# Patient Record
Sex: Male | Born: 1945 | Race: White | Hispanic: No | Marital: Married | Smoking: Current every day smoker
Health system: Southern US, Community
[De-identification: ages and names within clinical notes are randomized; demographics above are authoritative.]

## PROBLEM LIST (undated history)

## (undated) DIAGNOSIS — J189 Pneumonia, unspecified organism: Secondary | ICD-10-CM

## (undated) DIAGNOSIS — E119 Type 2 diabetes mellitus without complications: Secondary | ICD-10-CM

## (undated) DIAGNOSIS — I509 Heart failure, unspecified: Secondary | ICD-10-CM

## (undated) DIAGNOSIS — C679 Malignant neoplasm of bladder, unspecified: Secondary | ICD-10-CM

## (undated) DIAGNOSIS — I2510 Atherosclerotic heart disease of native coronary artery without angina pectoris: Secondary | ICD-10-CM

## (undated) DIAGNOSIS — Z8673 Personal history of transient ischemic attack (TIA), and cerebral infarction without residual deficits: Secondary | ICD-10-CM

## (undated) DIAGNOSIS — I10 Essential (primary) hypertension: Secondary | ICD-10-CM

## (undated) DIAGNOSIS — Z8679 Personal history of other diseases of the circulatory system: Secondary | ICD-10-CM

## (undated) DIAGNOSIS — E782 Mixed hyperlipidemia: Secondary | ICD-10-CM

## (undated) HISTORY — DX: Pneumonia, unspecified organism: J18.9

## (undated) HISTORY — PX: BICEPS TENDON REPAIR: SHX566

## (undated) HISTORY — PX: CARDIAC ELECTROPHYSIOLOGY MAPPING AND ABLATION: SHX1292

## (undated) HISTORY — PX: CARPAL TUNNEL RELEASE: SHX101

## (undated) HISTORY — PX: ROTATOR CUFF REPAIR: SHX139

## (undated) HISTORY — PX: OTHER SURGICAL HISTORY: SHX169

## (undated) HISTORY — PX: TIBIA FRACTURE SURGERY: SHX806

## (undated) HISTORY — PX: CORONARY ANGIOPLASTY: SHX604

## (undated) HISTORY — PX: BALLOON ANGIOPLASTY, ARTERY: SHX564

## (undated) HISTORY — PX: CORONARY STENT PLACEMENT: SHX1402

---

## 1983-11-24 DIAGNOSIS — K449 Diaphragmatic hernia without obstruction or gangrene: Secondary | ICD-10-CM | POA: Insufficient documentation

## 1998-06-20 ENCOUNTER — Encounter: Payer: Self-pay | Admitting: Emergency Medicine

## 1998-06-20 ENCOUNTER — Emergency Department (HOSPITAL_COMMUNITY): Admission: EM | Admit: 1998-06-20 | Discharge: 1998-06-20 | Payer: Self-pay | Admitting: Emergency Medicine

## 1999-04-22 ENCOUNTER — Ambulatory Visit (HOSPITAL_BASED_OUTPATIENT_CLINIC_OR_DEPARTMENT_OTHER): Admission: RE | Admit: 1999-04-22 | Discharge: 1999-04-23 | Payer: Self-pay | Admitting: Orthopedic Surgery

## 1999-07-03 ENCOUNTER — Encounter: Payer: Self-pay | Admitting: Orthopedic Surgery

## 1999-07-03 ENCOUNTER — Encounter: Admission: RE | Admit: 1999-07-03 | Discharge: 1999-07-03 | Payer: Self-pay | Admitting: Orthopedic Surgery

## 1999-07-04 ENCOUNTER — Encounter: Admission: RE | Admit: 1999-07-04 | Discharge: 1999-07-04 | Payer: Self-pay | Admitting: Orthopedic Surgery

## 1999-07-16 ENCOUNTER — Encounter: Payer: Self-pay | Admitting: Orthopedic Surgery

## 1999-07-16 ENCOUNTER — Encounter: Admission: RE | Admit: 1999-07-16 | Discharge: 1999-07-16 | Payer: Self-pay | Admitting: Orthopedic Surgery

## 1999-12-21 ENCOUNTER — Encounter: Payer: Self-pay | Admitting: Neurosurgery

## 1999-12-21 ENCOUNTER — Encounter: Admission: RE | Admit: 1999-12-21 | Discharge: 1999-12-21 | Payer: Self-pay | Admitting: Neurosurgery

## 1999-12-24 ENCOUNTER — Encounter: Payer: Self-pay | Admitting: Neurosurgery

## 1999-12-24 ENCOUNTER — Ambulatory Visit (HOSPITAL_COMMUNITY): Admission: RE | Admit: 1999-12-24 | Discharge: 1999-12-24 | Payer: Self-pay | Admitting: Neurosurgery

## 2000-08-03 ENCOUNTER — Ambulatory Visit (HOSPITAL_COMMUNITY): Admission: RE | Admit: 2000-08-03 | Discharge: 2000-08-03 | Payer: Self-pay | Admitting: Neurosurgery

## 2000-08-09 ENCOUNTER — Encounter: Payer: Self-pay | Admitting: Neurosurgery

## 2000-08-09 ENCOUNTER — Ambulatory Visit (HOSPITAL_COMMUNITY): Admission: RE | Admit: 2000-08-09 | Discharge: 2000-08-09 | Payer: Self-pay | Admitting: Neurosurgery

## 2000-10-06 ENCOUNTER — Encounter: Admission: RE | Admit: 2000-10-06 | Discharge: 2000-10-06 | Payer: Self-pay | Admitting: Neurosurgery

## 2000-10-06 ENCOUNTER — Encounter: Payer: Self-pay | Admitting: Neurosurgery

## 2000-10-09 ENCOUNTER — Emergency Department (HOSPITAL_COMMUNITY): Admission: EM | Admit: 2000-10-09 | Discharge: 2000-10-09 | Payer: Self-pay | Admitting: Emergency Medicine

## 2000-10-09 ENCOUNTER — Encounter: Payer: Self-pay | Admitting: Emergency Medicine

## 2000-12-16 ENCOUNTER — Encounter: Payer: Self-pay | Admitting: Neurosurgery

## 2000-12-16 ENCOUNTER — Ambulatory Visit (HOSPITAL_COMMUNITY): Admission: RE | Admit: 2000-12-16 | Discharge: 2000-12-16 | Payer: Self-pay | Admitting: Neurosurgery

## 2002-02-05 ENCOUNTER — Inpatient Hospital Stay (HOSPITAL_COMMUNITY): Admission: EM | Admit: 2002-02-05 | Discharge: 2002-02-07 | Payer: Self-pay | Admitting: Emergency Medicine

## 2003-03-04 ENCOUNTER — Emergency Department (HOSPITAL_COMMUNITY): Admission: EM | Admit: 2003-03-04 | Discharge: 2003-03-04 | Payer: Self-pay | Admitting: Emergency Medicine

## 2003-03-21 ENCOUNTER — Ambulatory Visit (HOSPITAL_COMMUNITY): Admission: RE | Admit: 2003-03-21 | Discharge: 2003-03-21 | Payer: Self-pay | Admitting: Unknown Physician Specialty

## 2004-12-28 ENCOUNTER — Ambulatory Visit: Payer: Self-pay | Admitting: Family Medicine

## 2005-01-07 ENCOUNTER — Ambulatory Visit: Payer: Self-pay | Admitting: Family Medicine

## 2005-01-11 ENCOUNTER — Ambulatory Visit: Payer: Self-pay | Admitting: Family Medicine

## 2005-01-18 ENCOUNTER — Ambulatory Visit: Payer: Self-pay | Admitting: Family Medicine

## 2005-02-04 ENCOUNTER — Ambulatory Visit: Payer: Self-pay | Admitting: Family Medicine

## 2005-03-22 ENCOUNTER — Ambulatory Visit: Payer: Self-pay | Admitting: Family Medicine

## 2005-04-01 ENCOUNTER — Encounter: Admission: RE | Admit: 2005-04-01 | Discharge: 2005-04-01 | Payer: Self-pay | Admitting: General Surgery

## 2005-05-05 ENCOUNTER — Inpatient Hospital Stay (HOSPITAL_COMMUNITY): Admission: EM | Admit: 2005-05-05 | Discharge: 2005-05-07 | Payer: Self-pay | Admitting: Emergency Medicine

## 2005-06-21 ENCOUNTER — Ambulatory Visit: Payer: Self-pay | Admitting: Family Medicine

## 2005-06-25 ENCOUNTER — Ambulatory Visit: Payer: Self-pay | Admitting: Family Medicine

## 2005-07-23 ENCOUNTER — Ambulatory Visit: Payer: Self-pay | Admitting: Family Medicine

## 2005-07-30 ENCOUNTER — Ambulatory Visit: Payer: Self-pay | Admitting: Family Medicine

## 2005-10-12 ENCOUNTER — Ambulatory Visit: Payer: Self-pay | Admitting: Family Medicine

## 2005-11-25 ENCOUNTER — Ambulatory Visit: Payer: Self-pay | Admitting: Family Medicine

## 2005-11-25 ENCOUNTER — Inpatient Hospital Stay (HOSPITAL_COMMUNITY): Admission: EM | Admit: 2005-11-25 | Discharge: 2005-11-26 | Payer: Self-pay | Admitting: Emergency Medicine

## 2005-11-28 ENCOUNTER — Ambulatory Visit: Payer: Self-pay | Admitting: Internal Medicine

## 2005-11-28 ENCOUNTER — Inpatient Hospital Stay (HOSPITAL_COMMUNITY): Admission: EM | Admit: 2005-11-28 | Discharge: 2005-12-01 | Payer: Self-pay | Admitting: Emergency Medicine

## 2005-12-14 ENCOUNTER — Ambulatory Visit: Payer: Self-pay | Admitting: Internal Medicine

## 2005-12-20 ENCOUNTER — Ambulatory Visit: Payer: Self-pay | Admitting: Internal Medicine

## 2005-12-20 ENCOUNTER — Ambulatory Visit (HOSPITAL_COMMUNITY): Admission: RE | Admit: 2005-12-20 | Discharge: 2005-12-21 | Payer: Self-pay | Admitting: Internal Medicine

## 2006-01-18 ENCOUNTER — Ambulatory Visit: Payer: Self-pay | Admitting: Internal Medicine

## 2006-01-19 ENCOUNTER — Ambulatory Visit: Payer: Self-pay | Admitting: Family Medicine

## 2006-05-24 ENCOUNTER — Ambulatory Visit: Payer: Self-pay | Admitting: Family Medicine

## 2006-06-20 ENCOUNTER — Ambulatory Visit: Payer: Self-pay | Admitting: Family Medicine

## 2006-06-30 ENCOUNTER — Ambulatory Visit: Payer: Self-pay | Admitting: Family Medicine

## 2006-08-02 ENCOUNTER — Ambulatory Visit: Payer: Self-pay | Admitting: Family Medicine

## 2006-08-16 ENCOUNTER — Ambulatory Visit: Payer: Self-pay | Admitting: Family Medicine

## 2006-09-02 ENCOUNTER — Inpatient Hospital Stay (HOSPITAL_COMMUNITY): Admission: EM | Admit: 2006-09-02 | Discharge: 2006-09-05 | Payer: Self-pay | Admitting: Emergency Medicine

## 2007-01-04 ENCOUNTER — Inpatient Hospital Stay (HOSPITAL_COMMUNITY): Admission: EM | Admit: 2007-01-04 | Discharge: 2007-01-05 | Payer: Self-pay | Admitting: *Deleted

## 2007-10-09 ENCOUNTER — Encounter: Admission: RE | Admit: 2007-10-09 | Discharge: 2007-11-07 | Payer: Self-pay | Admitting: Family Medicine

## 2007-11-23 ENCOUNTER — Encounter: Admission: RE | Admit: 2007-11-23 | Discharge: 2007-11-23 | Payer: Self-pay | Admitting: Sports Medicine

## 2009-07-01 ENCOUNTER — Inpatient Hospital Stay (HOSPITAL_COMMUNITY): Admission: EM | Admit: 2009-07-01 | Discharge: 2009-07-07 | Payer: Self-pay | Admitting: Emergency Medicine

## 2010-03-15 ENCOUNTER — Encounter: Payer: Self-pay | Admitting: Family Medicine

## 2010-05-11 LAB — GLUCOSE, CAPILLARY
Glucose-Capillary: 129 mg/dL — ABNORMAL HIGH (ref 70–99)
Glucose-Capillary: 147 mg/dL — ABNORMAL HIGH (ref 70–99)

## 2010-05-12 LAB — GLUCOSE, CAPILLARY
Glucose-Capillary: 108 mg/dL — ABNORMAL HIGH (ref 70–99)
Glucose-Capillary: 142 mg/dL — ABNORMAL HIGH (ref 70–99)
Glucose-Capillary: 155 mg/dL — ABNORMAL HIGH (ref 70–99)
Glucose-Capillary: 163 mg/dL — ABNORMAL HIGH (ref 70–99)
Glucose-Capillary: 189 mg/dL — ABNORMAL HIGH (ref 70–99)
Glucose-Capillary: 241 mg/dL — ABNORMAL HIGH (ref 70–99)
Glucose-Capillary: 88 mg/dL (ref 70–99)

## 2010-05-12 LAB — APTT: aPTT: 26 seconds (ref 24–37)

## 2010-05-12 LAB — BASIC METABOLIC PANEL
Chloride: 104 mEq/L (ref 96–112)
Creatinine, Ser: 0.96 mg/dL (ref 0.4–1.5)
GFR calc Af Amer: >60 mL/min (ref >60)
GFR calc non Af Amer: >60 mL/min (ref >60)
Potassium: 4.5 mEq/L (ref 3.5–5.1)

## 2010-05-12 LAB — CBC
HCT: 39.3 % (ref 39.0–52.0)
MCV: 88 fL (ref 78.0–100.0)
RBC: 4.46 MIL/uL (ref 4.22–5.81)
WBC: 10.2 10*3/uL (ref 4.0–10.5)

## 2010-05-12 LAB — ABO/RH: ABO/RH(D): O POS

## 2010-05-12 LAB — PROTIME-INR: Prothrombin Time: 13.3 seconds (ref 11.6–15.2)

## 2010-07-07 NOTE — Discharge Summary (Signed)
NAMEKAMIL, Richard [name]                ACCOUNT NO.:  [id number]   MEDICAL RECORD NO.:  [id number]          PATIENT TYPE:  INP   LOCATION:  2870                         FACILITY:  [facility]   PHYSICIAN:  Nanetta Batty, M.D.   DATE OF BIRTH:  [birth year]   DATE OF ADMISSION:  09/02/2006  DATE OF DISCHARGE:  09/05/2006                               DISCHARGE SUMMARY   DISCHARGE DIAGNOSIS:  1. Chest pain status post cardiac catheterization during this      admission on September 05, 2006.  No critical coronary disease, with      patent previously placed stent to the RCA.  2. Known coronary artery disease, status post right coronary artery      stenting in March 2007.  3. Atrial flutter status post AV node dilation; maintaining sinus      rhythm.  4. Hypertension.  5. Hyperlipidemia.  6. Type 2 diabetes mellitus.   HOSPITAL COURSE:  This is a 65 year old Caucasian gentleman, patient of  Dr. Allyson Sabal; with previous history of CAD and RCA stenting in March 2007.  He presented to the emergency room with complaints of chest pain.  He  had the onset of pain on September 02, 2006 after lunch.  He described this  as a midsternal pressure, radiating to the back.  The patient complained  of progressive shortness of breath and exertional dyspnea over the last  month or so; but, never had chest pain actually before the day of  presentation to the ER.  He described feeling poorly all day long, with  a decreased activity tolerance.  We admitted him to the hospital on a  rule-out MI protocol.  We cycled his enzymes, which were negative x3.   His physical examination did not reveal any significant findings.  He  was kept in the telemetry unit over the night on IV heparin, and  scheduled for catheterization on Monday, September 05, 2006.   HOSPITAL PROCEDURE:  Cardiac catheterization was performed by Dr. Tresa Endo.  The catheterization revealed left ventricular ejection fraction of 45%  with some mild anteroapical akinesis.  He  also had some scattered  noncritical coronary artery disease in all three arteries; and the  previously placed stents into the RCA were patent.  The patient  tolerated the procedure well.  After that he was transferred to the  short-stay unit for observation until his bedrest expires.  If he  remains asymptomatic and stable after ambulation post bedrest, he might  be discharged home.   DISCHARGE MEDICATIONS:  1. Actos 45 mg daily.  2. Metformin 500 mg daily.  3. Zetia 10 mg daily.  4. Lopressor 50 mg b.i.d.  5. Plavix 71 mg daily.  6. Aspirin 81 mg daily.  7. Protonix 40 mg daily.  8. Imdur 30 mg daily.   Again, the patient was instructed to restart Glucophage on Thursday,  September 08, 2006.   DISCHARGE DIET:  Low-salt, low-fat, low-cholesterol diet.   DISCHARGE FOLLOWUP:  Dr. Nanetta Batty will see the patient in our  office on 2:30 p.m. September 19, 2006.   DISCHARGE INSTRUCTIONS:  The patient was instructed not to drive and not  to lift weights for 3 days post catheterization.  He was advised to call  our office should he develop any problems.      Raymon Mutton, P.A.      Nanetta Batty, M.D.  Electronically Signed    MK/MEDQ  D:  09/05/2006  T:  09/06/2006  Job:  [id number]

## 2010-07-07 NOTE — Cardiovascular Report (Signed)
NAME:  Richard, [name] NO.:  [id number]   MEDICAL RECORD NO.:  [id number]          PATIENT TYPE:  INP   LOCATION:  2870                         FACILITY:  [facility]   PHYSICIAN:  Nicki Guadalajara, M.D.     DATE OF BIRTH:  08-12-1945   DATE OF PROCEDURE:  09/05/2006  DATE OF DISCHARGE:  09/05/2006                            CARDIAC CATHETERIZATION   INDICATIONS:  Richard [name] is a 65 year old gentleman, patient of  Dr. Allyson Sabal, who has a history of known CAD.  He is status post stenting  of his proximal and mid right coronary artery in March 2007 with  placement of 3.5 x 13 and 3.5 x 18 mm Cypher stents.  His last  catheterization in October 2007 showed stents patent, but he did have  20% stenosis between the stented segment.  He had 30% narrowing in the  circumflex bifurcation of the OM-1 vessel.  Ejection fraction was 50%.  The patient had been doing fairly well.  Approximately 2 weeks ago, he  self discontinued Plavix.  He has continued to smoke cigarettes.  He  ultimately presented to [facility] late Friday evening with  chest pain worrisome for unstable angina.  He ruled out for myocardial  infarction.  Enzymes were negative.  The patient also has a history of  the AV node ablation for atrial flutter, hypertension, hyperlipidemia  and diabetes mellitus.  Definitive repeat catheterization was  recommended.   PROCEDURE:  After premedication with Valium 5 mg intravenously, the  patient was prepped and draped in the usual fashion.  His right femoral  artery was punctured anteriorly, and a 5-French sheath was inserted  without difficulty.  Diagnostic catheterization was done utilizing 5-  [place] Judkins 4 left and right coronary catheters.  A 5-French pigtail  catheter was used for biplane cine left ventriculography.  Hemostasis  was obtained by direct manual pressure.  The patient tolerated the  procedure well.   HEMODYNAMIC DATA:  Central aortic pressure was  160/79.  Left ventricular  pressure was 160/14, post A-wave 21.   ANGIOGRAPHIC DATA:  Left main coronary was angiographically normal and  bifurcated into an LAD and left circumflex system.   There was mild 20% ostial narrowing of the diagonal vessel which arose  from the mid LAD.  The remainder of the LAD was free of significant  disease.   The circumflex vessel had 20% ostial area of irregularity.  There was 20-  30% narrowing in the circumflex at the bifurcation of the OM vessel.   The right coronary artery was a dominant vessel.  There were widely  patent stents in the proximal to mid segment.  There was 20% narrowing  just proximal to the second stent in the region that was not stented  between the two previously placed stents.  The remainder of the RCA was  free of significant disease.   Biplane selective left ventriculography revealed mild LV dysfunction  with an ejection fraction of 45%.  There was mild hypocontractility  inferiorly on the RAO projection and involving the inferoapical to low  posterolateral wall  on the LAO projection.   IMPRESSION:  1. Mild left ventricular dysfunction with inferior to inferolateral      hypocontractility and ejection fraction 45%.  2. Mild coronary obstructive disease with 20% narrowing in the ostium      of the diagonal vessel of the left anterior descending; 20% ostial      circumflex stenosis with 20-30% bifurcation stenosis in the mid to      distal circumflex involving a bifurcation of a marginal vessel; and      widely patent stents in the proximal to mid right coronary artery      with 20% narrowing between the stented segments just proximal to      the mid right coronary artery stent.   RECOMMENDATIONS:  Medical therapy.  The patient will be continued on  Plavix indefinitely.  He also was advised smoking cessation.  Medications will be adjusted for more optimal blood pressure control.           ______________________________   Nicki Guadalajara, M.D.     TK/MEDQ  D:  09/05/2006  T:  09/05/2006  Job:  [id number]   cc:   Nanetta Batty, M.D.

## 2010-07-07 NOTE — Discharge Summary (Signed)
NAME:  Richard [name], Richard [name] NO.:  [id number]   MEDICAL RECORD NO.:  [id number]          PATIENT TYPE:  INP   LOCATION:  4735                         FACILITY:  [facility]   PHYSICIAN:  Nanetta Batty, M.D.   DATE OF BIRTH:  09-25-1945   DATE OF ADMISSION:  01/04/2007  DATE OF DISCHARGE:  01/05/2007                               DISCHARGE SUMMARY   Richard [name] is a 65 year old male patient with previous history of  coronary disease and AV nodal ablation.  He was visiting his mother in  the neuro ICU unit at [facility] and he had an episode of severe chest  pain radiating to his lower jaw.  He went to the emergency room.  He was  given 3 nitroglycerin and eventually started on IV nitroglycerin drip  with eventual relief of his pain.  He was seen by Dr. Elsie Lincoln who thought  he should probably undergo another cardiac catheterization.  He was put  on IV heparin.  His CK-MBs and troponins all came back negative.  His  labs looked appropriate.  He was stable for a cardiac cath on January 05, 2007 and his left main, LAD, and left circumflex were okay.  His RCA  which was dominant with the stents was widely patent.  His EF was 45-  50%.  The cath was performed by Dr. Nanetta Batty, his primary  cardiologist.  He had his hours of bed rest.  He was up walking in the  halls and it was decided he was stable for discharge home.   DISCHARGE MEDICATIONS:  1. Metoprolol ER 25 mg 1-1/2 tabs twice daily.  2. Aspirin 81 mg daily.  3. Actos 45 mg daily.  4. Plavix 75 mg daily.  5. Metformin nightly.  6. Vytorin 1 tab daily.  7. Glipizide ER 5 mg daily.  8. Prilosec 20 mg twice a day for 2 weeks and then once a day.  9. Hold metformin until Sunday.  10.Nitroglycerin 1/150 one under tongue every 5 minutes x3 when needed      for chest pain.   LABS:  Hemoglobin 13, hematocrit 38.2, WBC 8.1 and platelets 168.  His  sodium was 139, potassium 3.9, BUN was 11, creatinine 0.91, hemoglobin  A1c  was 9.4.  TSH was 1.676, total cholesterol is 113, triglycerides 73,  HDL was 40, LDL was 58.   DISCHARGE DIAGNOSES:  1. Chest pain, not cardiac ischemia, status post cardiac      catheterization and patent stents, no obstructive disease.  2. Ischemic cardiomyopathy with an ejection fraction of 45-50%.  3. Non-insulin-dependent diabetes mellitus with poor glycemic control      with a hemoglobin A1c of 9.4.  4. Prior history of coronary artery disease with history of 2 drug-      eluting stents placed to the RCA March 2007, July 2008 with      noncritical disease, patent stent.  5. Status post atrioventricular node ablation by Dr.  Klein, October      20 07, for A-flutter and PSVT.  6. Gastroesophageal reflux disease placed on Prilosec, was  not on a      PPI prior, chest pain may be related to GERD.      Lezlie Octave, N.P.      Nanetta Batty, M.D.  Electronically Signed    BB/MEDQ  D:  01/05/2007  T:  01/06/2007  Job:  [id number]   cc:   Stefanie Libel

## 2010-07-07 NOTE — Cardiovascular Report (Signed)
NAME:  Richard [name], Richard [name] NO.:  [id number]   MEDICAL RECORD NO.:  [id number]          PATIENT TYPE:  INP   LOCATION:  4735                         FACILITY:  [facility]   PHYSICIAN:  Nanetta Batty, M.D.   DATE OF BIRTH:  19-Dec-1945   DATE OF PROCEDURE:  DATE OF DISCHARGE:  01/05/2007                            CARDIAC CATHETERIZATION   [name] is a 65 year old gentleman with a history of known CAD status  post PCI stenting of his proximal mid RCA using Cypher drug-eluting  stents March 2007. His other problems include history of type 2  diabetes, controlled dyslipidemia and AV node ablation as well as GERD.  He was admitted yesterday with unstable angina after visiting his mother  in the ICU.  He ruled out for myocardial infarction by isoenzymes.  He  had no EKG changes.  He was placed on IV heparin and nitroglycerin.  He  presents now for a diagnostic coronary angiography to define his anatomy  and rule out ischemic etiology.   DESCRIPTION OF PROCEDURE:  The patient was brought to the second floor  [facility] cardiac cath lab in the postabsorptive state.  He was  premedicated with p.o. Valium, IV Versed and fentanyl.  His right groin  was prepped and shaved in the usual sterile fashion. 1% Xylocaine  was  used for local anesthesia.  A 6-French sheath was inserted into the  right femoral artery using the standard Seldinger technique.  A 6-French  right and left Judkins diagnostic catheter as well as a 6-French pigtail  catheter were used for selective cholangiography and left  ventriculography respectively.  Visipaque dye was used for the entirety  of the case.  Retrograde aorta, ventricular and pullback pressures were  recorded.   HEMODYNAMIC RESULTS:  Aortic systolic pressure 119, diastolic pressure  66.  Left ventricular systolic pressure 129, end-diastolic pressure 15.  There is no obvious pullback gradient noted visually.   SELECTIVE CORONARY ANGIOGRAPHY:  1. Left main normal.  2. LAD normal.  3. Left circumflex normal.  4. Right coronary artery is dominant with two patent stents in the      proximal mid portion and no other significant disease.   LEFT VENTRICULOGRAPHY;  RAO left ventriculogram was performed using 25  mL of Visipaque dye at 12 mL per second.  The overall LVEF was estimated  between 45 and 50% with mild to moderate inferobasal hypokinesia.   IMPRESSION:  [name] has widely patent right coronary artery stents  with noncritical disease and preserved left ventricular function.  His  anatomy is unchanged since his prior cath done 5 months ago.  His  heparin and nitroglycerin were discontinued.  ACT was measured and the  sheath was sewn securely in place.  The patient left the  lab in stable condition. The sheath will be removed once the ACT falls  below 175.  He will remain recumbent for 5 hours at which time he will  be discharged home and will see him back in the office in 1-2 weeks for  follow-up.  He left the lab in stable  condition.      Nanetta Batty, M.D.  Electronically Signed     JB/MEDQ  D:  01/05/2007  T:  01/05/2007  Job:  [id number]   cc:   Delaney Meigs, M.D.

## 2010-07-10 NOTE — Op Note (Signed)
NAME:  Richard [name], Richard [name]                ACCOUNT NO.:  [id number]   MEDICAL RECORD NO.:  [id number]          PATIENT TYPE:  OIB   LOCATION:  3706                         FACILITY:  [facility]   PHYSICIAN:  Duke Salvia, MD, FACCDATE OF BIRTH:  1946-01-15   DATE OF PROCEDURE:  12/22/2005  DATE OF DISCHARGE:  12/21/2005                                 OPERATIVE REPORT   PREOPERATIVE DIAGNOSES:  Atrial flutter, atrial tachycardia.   POSTOPERATIVE DIAGNOSES:  Atrial flutter, atrial tachycardia.   PROCEDURES:  Invasive electrophysiological study, arrhythmia mapping times  two, radiofrequency catheter ablation times two, and isoproterenol infusion.   SURGEON:  Duke Salvia, MD, [facility].   DESCRIPTION OF PROCEDURE:  Following the obtaining of informed consent, the  patient was brought to the electrophysiology laboratory and placed on the  fluoroscopic table in a supine position. After routine prep and drape,  cardiac catheterization was performed under local anesthesia and conscious  sedation.  Noninvasive blood pressure monitoring, transcutaneous oxygen  saturation monitoring and end-tidal CO2 monitoring were performed  continuously throughout the procedure.  Following the procedure, the  catheters were removed, hemostasis was obtained, and the patient was  transferred to the holding area in a stable condition.   CATHETERS:  A 5-French quadripolar catheter was inserted via the left  femoral vein to the AV junction to measure the His electrogram.   A 5-French quadripolar catheter was inserted via the left femoral vein to  the right ventricular apex. .   A 6-French octapolar catheter was inserted via the right femoral vein to the  coronary sinus.   A 7-French duodecapolar catheter was inserted via the left femoral vein to  the tricuspid annulus and the high right atrium.   An 8-French x 8-mm deflectable-tip ablation catheter was inserted into the  right femoral vein to mapping sites in the  posterior septal space, and then  subsequently switched out for a 5-mm ablation catheter for mapping in the  high right atrium.   Surface leads 1, aVF and v1 were monitored continuously throughout the  procedure.  Following insertion of the catheters, the stimulation protocol  included incremental atrial pacing, incremental ventricular pacing, single  and double-atrial extrastimuli paced cycle lengths of 500 and 400 msec in  the absence and in the presence of isoproterenol.   RESULTS:  Surface electrocardiogram:  Initial: Rhythm: sinus; RR interval 965 msec; PR interval 176 msec; QRS  duration 111 msec; QT interval 465 msec; P wave duration: 126 msec; bundle  branch block absent; preexcitation absent; AH interval 72 msec; HP interval  47 msec.   Final: Rhythm: sinus: RR interval 712 msec; PR interval 164 msec; QRS  duration 94 msec; QT interval 394 msec; P wave duration 108 msec; bundle  branch block absent; preexcitation absent; AH interval 62 msec; HP interval  41 msec.   AV Wenckebach 400 msec; VA Wenckebach was 600 msec.   AV nodal conduction was continuous.   Accessory pathway function:  No evidence of an accessory pathway was  identified.   Arrhythmias induced:  Atrial flutter was transiently induced, but mostly  gave rise to atrial tachycardia.  Because of typical atrial flutter by  electrocardiogram, electrogram mapping was used to undertake RF catheter  ablation of the cavotricuspid isthmus, which was accomplished successfully.   Thereafter, atrial tachycardia was inducible in a nonsustained form.  Mapping demonstrated early atrial activation in the high right atrium along  the crista.  The cycle length of the tachycardia was about 330 msec.  Mapping in that area to preclude diaphragmatic stimulation and potential  injury resulted in termination of the tachycardia repeatedly; not even with  isoproterenol would it sustain.   RF ablation was undertaken with sites of  earliest atrial activation where we  were -15 to -20 prior to the surface P wave after termination of  tachycardia, but the tachycardia remained inducible.   Fluoroscopy time:  A total of 8 minutes 32 seconds fluoroscopy time was  utilized at 7 1/2 frames per second in the biplane and single-plane modes.   Radiofrequency energy:  A total of 9 minutes 59 seconds of RF energy was  applied to sites in the cavotricuspid isthmus, resulting in a cavotricuspid  isthmus conduction block, and in sites in the high right atrium, as  described previously.   IMPRESSION:  1. Normal sinus function.  2. Abnormal atrial function manifested by (a) cavotricuspid isthmus      conduction and previously documented atrial flutter, and (b) a high      right atrial tachycardia along the crista terminalis, the former of      which was successfully eliminated, and the latter of which was not for      the reasons described above.  3. Normal atrioventricular nodal function.  4. Normal His-Purkinje system function.  5. No accessory pathway.  6. Normal ventricular response to programmed stimulation.   SUMMARY:  The results of the electrophysiology testing and arrhythmia  mapping demonstrated cavotricuspid isthmus conduction that was successfully  eliminated with RF catheter ablation, as well as a high right atrial  tachycardia that was in the vicinity of the crista terminalis and  potentially the phrenic nerve.  Because of the difficulties as outlined  above, it was elected to terminate further ablation and consider restudy  with ESI.   COMPLICATIONS:  The patient had a significant amount of discomfort with the  procedure, and we actually had to reverse his sedation to help facilitate  cooperation.  I should note that this report is dictated after the patient  was discharged, and the patient has no recollection of his discomfort. Because of that, in the event that we return for further repeat procedure, I   would recommend that we do this under anesthesia.           ______________________________  Duke Salvia, MD, [facility]     SCK/MEDQ  D:  12/22/2005  T:  12/22/2005  Job:  [id number]   cc:   Nanetta Batty, M.D.  Electrophysiology Laboratory

## 2010-07-10 NOTE — Discharge Summary (Signed)
NAME:  [name], [name] NO.:  [id number]   MEDICAL RECORD NO.:  [id number]          PATIENT TYPE:  INP   LOCATION:  3734                         FACILITY:  [facility]   PHYSICIAN:  Richard [name], M.D.   DATE OF BIRTH:  1945/09/18   DATE OF ADMISSION:  11/28/2005  DATE OF DISCHARGE:  12/01/2005                                 DISCHARGE SUMMARY   [name] is a 65 year old white, married, male patient who was  recently admitted on November 25, 2005 with complaints of chest pain.  He  underwent cardiac cath on November 26, 2005.  He had patent RCA stents and  then an EF of 50%, 30% in his distal circumflex.  He comes back to the  hospital on November 28, 2005 with symptoms of severe shortness of breath,  accompanied by tightness and pressure in the chest and jaw.  He came to the  ER, his heart rate was 170 in a PSVT and he was given adenosine.  It broke;  however, he then went back into SVT and he again received adenosine, which  broke it again.  He then had a third episode of SVT and our service was  called to intervene.  He was seen by Dr. Elsie Lincoln and it was decided to admit  him and begin antiarrhythmic.  He started Rythmol 225 mg p.o. b.i.d.  His  Toprol was held.  He also was started on Xanax because of anxiety.  It was  then decided that he should undergo an EP evaluation for possible ablation  of his PSVT.  He was seen by Dr. 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  It was decided to stop his Rythmol and to put him on  metoprolol and verapamil and a recommendation for ablation.  The patient  agreed with the ablation.  Because of scheduling, it was decided that he  should be discharged home and then return in about 2 weeks for his ablation.  He will be seen back in the office by Dr. Graciela Husbands on December 14, 2005 and his  ablation is scheduled for December 20, 2005.  After  receiving several doses  of metoprolol and verapamil, he had no further arrhythmia, last one being  November 30, 2005 at 1 a.m.  Patient was, again, recommended to quit smoking.  He was put on Xanax for anxiety.  He has tried Chantix in the past, but it  gave him severe nightmares.  He has tried multiple statins and they all give  him severe myalgias to the point of crippling him.  Thus, at the time of  discharge, it was decided to try him on plain Zetia.   LABS:  Hemoglobin was 14.6, hematocrit 42.0, platelets 166.  BNP was 55.  CK-  MBs were negative.  His sodium was 139, potassium 4.1, chloride 110, BUN 12,  creatinine was 1.0.  His TSH was 5.013.  Chest x-ray showed no acute  findings.   DISCHARGE MEDICATIONS:  Are:  1. Protonix  40 mg two times per day.  2. Actos 45 mg one time per day.  3. Aspirin 81 mg one time per day.  4. Plavix 75 mg one time per day.  5. Xanax 0.5 mg two times a day when needed.  6. Metoprolol 50 mg two times per day.  7. Verapamil 40 mg every 8 hours.  8. Zetia 10 mg one time per day.   He will follow up with Dr. Allyson Sabal in the first 2 weeks of November.  He will  go back to see Dr. Graciela Husbands on October 23, and he needs to have his ablation on  October 29.   DISCHARGE DIAGNOSES:  1. Paroxysmal supra-ventricular tachycardia and possible atrial      tachycardia.  2. Coronary artery disease, status post right coronary artery stenting x2      with CYPHER stents May 06, 2005, recathed November 26, 2005, showing      patent stents.  3. Noninsulin dependent diabetes mellitus.  4. Hyperlipidemia.  5. Gastroesophageal reflux disease.  6. Ejection fraction of 50%.  7. Anxiety.      Richard [name], N.P.      Richard [name], M.D.  Electronically Signed    BB/MEDQ  D:  12/01/2005  T:  12/01/2005  Job:  [id number]   cc:   Duke Salvia, MD, [facility]  Delaney Meigs, M.D.

## 2010-07-10 NOTE — Cardiovascular Report (Signed)
Richard [name], Richard [name]                ACCOUNT NO.:  [id number]   MEDICAL RECORD NO.:  [id number]          PATIENT TYPE:  INP   LOCATION:  2016                         FACILITY:  [facility]   PHYSICIAN:  Nanetta Batty, M.D.   DATE OF BIRTH:  [birth year]   DATE OF PROCEDURE:  05/06/2005  DATE OF DISCHARGE:                              CARDIAC CATHETERIZATION   PROCEDURE:  Cardiac catheterization/percutaneous coronary intervention and  stent.   Richard [name] is a 65 year old married white male, father of four, whose mother  Lendell Caprice is a patient of mine.  He had coronary angiography performed by Dr.  Geralynn Rile February 06, 2002 revealing noncritical CAD.  He does have  positive risk factors including tobacco abuse, insulin-requiring diabetes.  He was admitted March 14 with unstable angina.  He ruled out for myocardial  infarction.  He presents now for diagnostic coronary arteriography to rule  out ischemic etiology.   PROCEDURE DESCRIPTION:  The patient was brought to the second floor [facility] in a postabsorptive state.  He was  premedicated with p.o. Valium and IV morphine.  His right groin was prepped  and shaved in the usual sterile fashion.  1% Xylocaine was used for local  anesthesia.  A 6 French sheath was inserted into the right femoral artery  using standard Seldinger technique.  A 6 French right Judkins diagnostic  catheter along with 6 French pigtail catheter were used for selective  coronary angiography, left ventriculography, sub-selective left internal  mammary artery angiography and distal abdominal aortography.  Visipaque dye  was used for the entirety of the case.  Aortic, ventricular and pulmonic  blood pressures were recorded.   HEMODYNAMICS:  1.  Aortic systolic pressure 108, diastolic pressure 62.  2.  Left ventricular systolic pressure 110, diastolic pressure 18.   SELECTIVE CORONARY ANGIOGRAPHY:  1.  Left main normal.  2.  LAD normal.  3.  Left circumflex 40% mid after first OM.  4.  Ramus intermedius branch small and normal.  5.  Right coronary artery large dominant vessel with 60-70% tubular      segmental proximal followed by 90% segmental mid on bend straddling the      moderate-sized acute marginal branch.   Left ventriculography; RAO left ventriculogram was performed using 25 mL of  Visipaque dye at 12 mL/sec.  LV EF was estimated at greater than 60% without  focal wall motion abnormalities.   Distal abdominal aortography performed using 20 mL of Visipaque dye at 20  mL/sec.  The renal arteries were widely patent.  The infrarenal abdominal  aorta and iliac bifurcation appear free of significant atherosclerotic  changes.   IMPRESSION:  Richard [name] has high grade proximal and mid right coronary  artery disease which are his culprit lesions.  Will plan to proceed with PCI  and stenting using Angiomax and drug-eluting stents.   PROCEDURE DESCRIPTION:  The existing 6 French sheath in the right femoral  artery was exchanged over a wire for 7 [place] sheath.  A 6 French sheath was  then inserted into the right femoral  vein.  The patient had received aspirin  this morning and received 600 mg of p.o. Plavix loading as well as Angiomax  load with an ACT documented at 336.  Visipaque dye was used for the entirety  of the case.  Retrograde aortic pressure was monitored during the case.  The  patient received intercoronary nitroglycerin twice during the case.   Using a 7 [place] JR4 guide catheter along with an [id number] Asahi soft and a  2.5 12 Maverick, PCI was performed on the mid RCA lesion.  Stenting was  performed using a 3.5 18 CYPHER stent at 15 atmospheres on the mid lesion  and 3.5 13 CYPHER direct stenting on the proximal lesion after 10  atmospheres resulting in reduction of both lesions to 0% residual with TIMI  III flow.  The patient did develop some chest pain requiring IV and  sublingual nitroglycerin most  likely related to transient occlusion of the  acute marginal branch which returned after nitroglycerin administration.   IMPRESSION:  Successful percutaneous coronary intervention and stenting of  the proximal and mid dominant right coronary artery with drug-eluting stent  and Angiomax with an excellent angiography result.   The guide wire and catheter were removed.  The sheaths were sewn securely in  place.  The patient left the lab in stable condition.  He will be hydrated  overnight and treated with aspirin and Plavix.  He will be discharged home  in the morning with close office followup.  He left the lab in stable  condition.      Nanetta Batty, M.D.  Electronically Signed     JB/MEDQ  D:  05/06/2005  T:  05/07/2005  Job:  [id number]   cc:   [facility]   Delaney Meigs, M.D.  Fax: [phone]

## 2010-07-10 NOTE — Discharge Summary (Signed)
NAME:  Richard [name], Richard [name]                ACCOUNT NO.:  [id number]   MEDICAL RECORD NO.:  [id number]          PATIENT TYPE:  OIB   LOCATION:  3706                         FACILITY:  [facility]   PHYSICIAN:  Duke Salvia, MD, FACCDATE OF BIRTH:  1945/05/30   DATE OF ADMISSION:  12/20/2005  DATE OF DISCHARGE:  12/21/2005                                 DISCHARGE SUMMARY   This patient has NO KNOWN DRUG ALLERGIES.   PRINCIPAL DIAGNOSES:  1. Discharging day 1, status post electrophysiology study/radiofrequency      catheter ablation of atrial flutter with creation of KVO tricuspid      isthmus block.  2. Study, October 29, also found a high right atrial cristal originating      tachycardia.  3. History of palpitations/chest tightness with atrial arrhythmias.  4. Patient returned in 6 weeks for [facility] guided electrophysiology      study/radiofrequency catheter ablation of right atrial cristal tachy      arrhythmia.   SECONDARY DIAGNOSES:  1. Coronary artery disease, admitted October of 2007 with chest pain.      a.     Identify spacers A with: Catheterization November 26, 2005.       Stents placed March of 2007 in the proximal to mid RCA are patent.       Ejection fraction is 50%.  2. Type 2 diabetes.  3. Dyslipidemia.  4. Gastroesophageal reflux disease.  5. Anxiety.   PROCEDURE:  December 20, 2005, electrophysiology study/radiofrequency  catheter ablation of atrial flutter - KVO tricuspid isthmus block, Dr.  Sherryl Manges.  The patient had no post procedural complications and has  maintained sinus rhythm.   BRIEF HISTORY:  Richard [name] is a 65 year old male with admissions x2 in early  October of 2007.  On October 4, he was admitted with chest pain.  A left  heart catheterization was done October 5.  It showed an ejection fraction of  50%.  Stents, which had been placed in March of 2007 in the proximal and mid  right coronary artery are both patent.   He was readmitted October 7, with  dyspnea, palpitations, chest tightness  which radiated to the jaw.  In the emergency room, he was found to have an  atrial dysrhythmia.  His heart rate was 170.  It broke with adenosine  challenge, suggesting atrial flutter.  There was a second on inspection of  his telemetry strips by Dr. 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  The  patient achieved rhythm control with both metoprolol and verapamil.  He was  discharged October 10 and was set up for an office visit with Dr. Graciela Husbands on  October 23.  At that time, the patient discussed with Dr. Graciela Husbands, the  possibility of an electrophysiology study with radiofrequency catheter  ablation.  The risks and benefits of the procedure were described to the  patient and he presents October 29 for that procedure.   Of note, the patient reports on 1 episode of very short lived palpitation  and chest tightness since his discharge on October 10.   HOSPITAL  COURSE:  The patient presents electively October 29.  He underwent  electrophysiology study with successful radiofrequency catheter ablation of  a typical counter clockwise atrial flutter with creation of a KVO tricuspid  isthmus block.  There was a second atrial arrhythmia, which was discovered  high in the right atrial cristal area, but his terminated prior to  radiofrequency application with phrenic pacing.  The patient was discharged  October 30 and revisit [facility] 6 weeks from now for a [facility] guided  electrophysiology study and possible ablation of this high right atrial  cristal tachycardia.   The patient is discharged on the following medications:  1. Metoprolol 75 mg twice daily, this is a new dose.  2. Protonix 40 mg twice daily.  3. Actos 45 mg daily.  4. Enteric-coated aspirin 81 mg daily.  5. Plavix 75 mg daily.  6. Xanax 0.5 mg every 12 hours as needed.  7. Verapamil 40 mg every 8 hours.   FOLLOWUP:  1. At [facility], November 26, [facility], to see Dr. Graciela Husbands,       Tuesday, November 27 at 11:30 in the morning.  2. To return [facility], Tuesday, December 18 at      6 o'clock in the morning.  He is asked not to eat anything after      midnight, Monday, December 17 and he is to hold off on taking his      verapamil and metoprolol the morning of December 18.   LABORATORY STUDIES:  Pertinent to this admission were obtained October 23.  Complete blood count:  White cells are 7.6, hemoglobin 14.7, hematocrit  44.3, platelets 207.  A PT was 12.3, INR 1.0.  Sodium is 141, potassium 4.1,  chloride 105, carbonate 31, glucose 123, BUN is 10, creatinine 0.9.     ______________________________  Maple Mirza, PA    ______________________________  Duke Salvia, MD, [facility]    GM/MEDQ  D:  12/21/2005  T:  12/21/2005  Job:  [id number]   cc:   Madaline Savage, M.D.  Delaney Meigs, M.D.

## 2010-07-10 NOTE — Op Note (Signed)
[place]  Patient:    Richard [name], Richard [name]                       MRN: [id number] Proc. Date: 04/22/99 Adm. Date:  [id number] Attending:  Cornell Barman                           Operative Report  PREOPERATIVE DIAGNOSIS:  Ruptured biceps, proximal left shoulder.  POSTOPERATIVE DIAGNOSIS:  Ruptured biceps, proximal left shoulder.  OPERATION:  Open repair, ruptured biceps and reattachment of proximal biceps tendon to transverse ligament and bicipital groove.  SURGEON:  Lenard Galloway. Chaney Malling, M.D.  ANESTHESIA:  General.  DESCRIPTION OF PROCEDURE:  After satisfactory induction of general endotracheal  anesthesia, the patient was placed on the operating table in the semi-sitting position.  An incision was made from the sternum down to the pectoral groove and carried distally.  Skin edges were retracted.  Bleeders were coagulated.  The biceps was found balled up distally in the distal third of the upper arm.  The ruptured biceps tendon could be found, and this was brought up proximally quite  easily.  Sutures were placed through the stump.  The area of the biceps and groove was identified nicely.  Sutures were passed underneath the transverse ligament ver the bicipital groove, and this was attached to the biceps tendon.  This was brought through the bicipital groove and folded back onto itself on top of the transverse ligament.  A number of heavy Tycron interrupted mattress sutures were used to stabilize the proximal end of the biceps.  The biceps was put under normal tension, with the elbow flexed.  Excellent repair of the proximal end of the biceps was achieved.  Throughout the procedure the shoulder was irrigated copiously with antibiotic solution.  Closure for subcutaneous tissue was achieved with 2-0 Vicryl.  Stainless steel and Steri-Strips were used to close the skin.  Sterile dressings were applied.  A posterior plaster splint  where he was able to flex was applied. The patient was then returned to recovery room in excellent condition.  DISPOSITION: 1. To [facility] for pain control and    nausea control. 2. Discharge in the a.m. 3. Return to my office in one week. 4. Sling left arm with posterior plaster splint, to keep the elbow flexed. DD:  04/22/99 TD:  04/22/99 Job: [id number]

## 2010-07-10 NOTE — Assessment & Plan Note (Signed)
[facility]                           ELECTROPHYSIOLOGY OFFICE NOTE   NAME:Richard [name], Richard [name]                       MRN:          [id number]  DATE:12/14/2005                            DOB:          [birth year]    Mr. Schleyer is seen in anticipation of an ablation procedure next week.  He  was seen a couple of weeks ago in the hospital where he had two different  arrhythmias noted, one was short RP tachycardia and the other was atrial  flutter.  Because of diabetes and hypertension, it was elected to proceed  with flutter ablation to minimize thromboembolic longterm risks.  His  Rythmol therapy that had been started was discontinued because of his prior  coronary artery disease with stenting.  On his combination of Verapamil and  Toprol, he has had no subsequent episodes.   We have reviewed again the procedure.   PHYSICAL EXAMINATION:  VITAL SIGNS:  Blood pressure 130/84, pulse 61.  LUNGS:  Clear.  HEART:  Heart sounds are regular.  EXTREMITIES:  Without edema.   He will be coming in on Monday for his procedure.  I have reviewed as noted,  benefits and potential risks.  He understands, agrees, and is willing to  proceed.            ______________________________  Duke Salvia, MD, [facility]     SCK/MedQ  DD:  12/14/2005  DT:  12/14/2005  Job #:  [id number]   cc:   Madaline Savage, M.D.  [facility]

## 2010-07-10 NOTE — Discharge Summary (Signed)
Richard [name], Richard [name]                ACCOUNT NO.:  [id number]   MEDICAL RECORD NO.:  [id number]          PATIENT TYPE:  INP   LOCATION:  3737                         FACILITY:  [facility]   PHYSICIAN:  Darlin Priestly, MD  DATE OF BIRTH:  01-Oct-1945   DATE OF ADMISSION:  11/25/2005  DATE OF DISCHARGE:  11/26/2005                                 DISCHARGE SUMMARY   DISCHARGE DIAGNOSES:  1. Chest pain, etiology undetermined, likely musculoskeletal.  2. Known history of coronary artery disease status post catheterization      during this admission by Dr. Jacinto Halim, with patent stents in the right      coronary artery from March of 2007.  3. Hyperlipidemia.  4. Noninsulin-dependent diabetes mellitus type 2, newly diagnosed during      last admission.   HISTORY OF PRESENT ILLNESS/HOSPITAL COURSE:  This is a 65 year old gentleman  with previous history of coronary artery disease who presented to the ER  with complaints of dull pain in the left upper shoulder and chest pain.  The  chest pain was dull and started at 8 o'clock in the morning.  Patient  presented to the hospital in the afternoon.  He denied any shortness of  breath or palpitations, but complained of diaphoresis.  There was also no  nausea or vomiting.  He was admitted to the telemetry unit.  His enzymes  were cycled and revealed no ischemia.  Patient was admitted to the telemetry  unit and scheduled for cardiac catheterization the next day.   HOSPITAL PROCEDURES:  Cardiac cath performed by Dr. Jacinto Halim on October 27, 2005.  It revealed ejection fraction 50%, patent stent in the RCA.  He also  has mild disease in the distal circumflex, around 30%.  Since his  catheterization did not reveal any intrinsic stenosis or any progression of  the circumflex disease, the etiology of chest pain was determined to be non  cardiac and empirically patient's Protonix was increased to 40 mg b.i.d.  dose, and he was started on Relafen for 7-10  days.   HOSPITAL LABORATORIES:  Hemoglobin A1c was 7.5 which is an improvement since  his prior hospitalization when it was 9.3. CBC showed white blood cell count  8.4, hemoglobin 15, hematocrit 38, platelets 164.  INR 1.  BNP 137,  potassium of 3.8, chloride 104, CO2 27, glucose 172, BUN 21, creatinine 1,  calcium 8.9.  TSH 4.288.  Lipid profile:  Total cholesterol 111,  triglycerides 215, cholesterol HDL 36, and LDL 52.   DISCHARGE MEDICATIONS:  1. Protonix 40 mg b.i.d.  2. Aspirin 81 mg daily.  3. Plavix 75 mg daily.  4. Lopressor 25 mg b.i.d.  5. Actos 45 mg daily.  6. Flexeril 10 mg 2x to 3x a day p.r.n.  7. Relafen 500 mg daily.   DISCHARGE DIET:  Low-fat, low cholesterol, low salt diet.   DISCHARGE ACTIVITIES:  No driving.  No lifting greater than 5 pounds for 3  days post cath.   DISCHARGE FOLLOWUP:  Dr. Allyson Sabal will see patient in our office on October  17th  at 12 noon.      Raymon Mutton, P.A.      Darlin Priestly, MD  Electronically Signed    MK/MEDQ  D:  11/26/2005  T:  11/27/2005  Job:  [id number]   cc:   [facility]  Dr. Allyson Sabal  Dr. Lysbeth Galas

## 2010-07-10 NOTE — H&P (Signed)
NAME:  Richard [name], Richard [name]                          ACCOUNT NO.:  [id number]   MEDICAL RECORD NO.:  [id number]                   PATIENT TYPE:  EMS   LOCATION:  MAJO                                 FACILITY:  [facility]   PHYSICIAN:  Rollene Rotunda, M.D. [facility]            DATE OF BIRTH:  04-Sep-1945   DATE OF ADMISSION:  02/05/2002  DATE OF DISCHARGE:                                HISTORY & PHYSICAL   REASON FOR PRESENTATION:  Evaluate patient with chest pain.   HISTORY OF PRESENT ILLNESS:  The patient is a very pleasant 65 year-old  white gentleman with a history of chest discomfort in 1988.  He reports a  catheterization without any apparent obstructive coronary artery disease.  He has had no further cardiac evaluation since then.  He is relatively  inactive because of back and neck pain suffered on the job.  However, with  his activities of daily living he has not been able to induce chest  discomfort.  He reports, however, one week of chest discomfort at rest.  This has been substernal.  He has noted it mostly in the evening. It is a 6  out of 10.  It is a chest pressure.  He has discomfort traveling down his  left arm.  It is happening with increasing frequency and intensity.  He did  wake at 4 a.m. with this discomfort and had some this morning.  He has had  no associated symptoms such as nausea, vomiting or diaphoresis. This is  unlike any  pain that he has had in the past.  He saw Dr. Dewaine Conger this  morning because he was complaining of polyuria.  He says Dr. Dewaine Conger did a  test and told him he had diabetes.  When questioned about the chest  discomfort, it was felt prudent to send him to the hospital.   PAST MEDICAL HISTORY:  1. New onset diabetes.  2. No history of hypertension.  3. Unknown cholesterol status.  4. Tobacco abuse.  5. Cervical and lumbar neck pain.   PAST SURGICAL HISTORY:  1. Cervical disk surgery.   ALLERGIES:  None.   MEDICATIONS:  1. Darvocet.  2. Codeine.  3. OxyContin p.r.n. (rarely according to the patient).   SOCIAL HISTORY:  He lives in [place] with his wife who is an Charity fundraiser at [employer].  They have grown children and grandchildren. 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  He has a 40  pack year smoking history.   FAMILY HISTORY:  Contributory for his brother having bypass at the age of  65.   REVIEW OF SYMPTOMS:  As stated in the history of present illness and  positive for nocturia, polydipsia and polyphagia, otherwise negative for  other systems.   PHYSICAL EXAMINATION:  GENERAL:  The patient is in no distress.  VITAL SIGNS:  Blood  pressure is 140/91, heart rate 73 and regular, afebrile.  HEENT:  Eyes are unremarkable.  Pupils equal, round and reactive to light.  Fundi are not visualized.  Oral mucosa is unremarkable. He is edentulous.  Neck: No jugular venous distention.  Wave forms within normal limits.  Carotid upstrokes are brisk and symmetric and no bruits or thyromegaly.  LYMPHATICS:  No cervical, axillary or inguinal adenopathy.  LUNGS:  Clear to auscultation bilaterally.  BACK:  No costovertebral angle tenderness.  CHEST:  Unremarkable.  HEART:  PMI is neither displaced or sustained.  S1 and S2 are within normal  limits.  No S3.  No S4.  No murmurs.  ABDOMEN:  Flat, positive bowel sounds, normal in frequency and pitch.  No  bruits, rebound or guarding.  Midline pulse not auscultated.  No  hepatomegaly.  No splenomegaly.  SKIN:  No rashes.  No nodules.  EXTREMITIES:  2+ pulses throughout.  No edema.  NEUROLOGIC:  The patient is oriented to person, place and time.  Cranial  nerves II-XII are grossly intact.  Motor is grossly intact.   EKG:  Sinus rhythm, rate 68, axes within normal limits, intervals within  normal limits and no acute ST or T wave changes.   Chest x-ray:  No acute disease.   LABORATORY DATA:  Hemoglobin 15.0, WBC 9.0, platelets 192,000.  Sodium 138,   potassium 4.1, BUN 14, creatinine 1.1.  CK 78, MB 1.8, Troponin 0.02.   ASSESSMENT:  1. Chest discomfort.  The patient's chest discomfort is consistent with     angina. It is an unstable pattern increasing in frequency and occurring     at rest.  Given this, it is prudent to admit him to the hospital and     treat him with Lovenox, aspirin and beta blocker.  He will have elective     cardiac catheterization.  We will treat him with secondary risk     reduction.  2. Diabetes.  We will check a hemoglobin A1c and initiate an oral agent.  We     will discuss this with Dr. Dewaine Conger to get his agent of choice.  3. Risk reduction. We will check a lipid profile.  4. Tobacco.  He will have a stop smoking consult.                                               Rollene Rotunda, M.D. [facility]    JH/MEDQ  D:  02/05/2002  T:  02/05/2002  Job:  [id number]   cc:   Colon Flattery  [address]  [place]  [place] [address]  Fax: [phone]

## 2010-07-10 NOTE — Discharge Summary (Signed)
NAMEPAXON, PROPES                ACCOUNT NO.:  [id number]   MEDICAL RECORD NO.:  [id number]          PATIENT TYPE:  INP   LOCATION:  6532                         FACILITY:  [facility]   PHYSICIAN:  Raymon Mutton, P.A. DATE OF BIRTH:  04/03/1945   DATE OF ADMISSION:  05/05/2005  DATE OF DISCHARGE:  05/07/2005                                 DISCHARGE SUMMARY   DISCHARGE DIAGNOSES:  1.  Coronary artery disease, status post percutaneous coronary intervention      during this admission.  2.  Non-insulin-dependent diabetes mellitus.  3.  Hypertension.  4.  Status post cervical __________.   HOSPITAL PROCEDURES:  Cardiac catheterization performed by Dr. Allyson Sabal on  May 06, 2005, that revealed mid RCA high-grade lesion of 90% and also the  proximal RCA with a 60 to 70% stenosis.  Dr. Allyson Sabal placed two CYPHER stents  in the RCA with reduction of the lesions from 90 to 0% and 60 to 70% to 0%.   HISTORY OF PRESENT ILLNESS/HOSPITAL COURSE:  This is a 65 year old  gentleman, who went to his primary care physician complaining of chest pain  radiating to the jaw and the left axilla, pressure in the chest, and  shortness of breath.  He was sent to the emergency room for evaluation.  He  had a prior catheterization with same symptoms, but no PCI was done at the  same time.  The patient had been doing relatively good since then up until  recently when he started having the discomfort, and today the pain was  severe enough forcing the patient to go and seek help.   On May 06, 2005, he underwent cardiac catheterization with placement of  two stents in the proximal and mid RCA.  For full disclosure of this  procedure, please refer to the dictated note.  The patient also has a  history of smoking, and a discussion was held with the patient about smoking  cessation.   HOSPITAL LABORATORY DATA:  Hemoglobin 13.6, hematocrit 39.5, white blood  cell count 8.1, platelets 156.  Sodium 139, potassium 4.3,  chloride 104, CO2  29, BUN 11, creatinine 0.9, glucose 192.  Cardiac enzymes were negative x3.  Total cholesterol 186, triglycerides 138, HDL 36, LDL 122, TSH 2.148,  glycosylated hemoglobin 9.3.   DISCHARGE INSTRUCTIONS:  The patient is to stay on a low salt, low fat, low  cholesterol and low carb diet. No driving for three days post cath.  No  lifting greater than 5 pounds post cath.  Increase activity slowly.   DISCHARGE MEDICATIONS:  1.  Lopressor 25 mg b.i.d.  2.  Aspirin 325 mg daily.  3.  Plavix 75 mg daily.  4.  Zocor 20 mg daily.   DISCHARGE FOLLOWUP:  Dr. Allyson Sabal will see patient on May 21, 2005, at 3  o'clock.  Also advised patient to address his poorly controlled diabetes  mellitus with his primary physician.      Raymon Mutton, P.A.     MK/MEDQ  D:  05/07/2005  T:  05/08/2005  Job:  [id number]

## 2010-07-10 NOTE — Cardiovascular Report (Signed)
NAMESAMMUEL, BLICK                ACCOUNT NO.:  [id number]   MEDICAL RECORD NO.:  [id number]          PATIENT TYPE:  INP   LOCATION:  3737                         FACILITY:  [facility]   PHYSICIAN:  Cristy Hilts. Jacinto Halim, MD       DATE OF BIRTH:  [birth year]   DATE OF PROCEDURE:  11/26/2005  DATE OF DISCHARGE:                              CARDIAC CATHETERIZATION   ATTENDING CARDIOLOGIST:  Nanetta Batty, M.D.   PRIMARY CARE PHYSICIAN:  Delaney Meigs, M.D.   OPERATION/PROCEDURE:  1. Left ventriculography.  2. Selective left coronary arteriography.  3. Intracoronary nitroglycerin administration.  4. Right femoral angiography and closure of right femoral artery access      with Starclose.   INDICATIONS:  Mr. Aniketh Huberty is a 65 year old gentleman with history of  known coronary artery disease with history of angioplasty to his right  coronary artery on May 06, 2005.  He has history of tobacco use, diabetes  and was admitted with unstable angina during March 2007.  He is now being  admitted to the hospital with atypical chest pain, but because of the  patient's concern and to evaluate for provisional coronary disease, because  of continued risk factors, he was brought to the cardiac catheterization lab  for definitive delineation of his coronary anatomy.   HEMODYNAMIC DATA:  Left ventricular pressure of 129/12 with an end diastolic  pressure was 30 mmHg.  The aortic pressure was 129/32 with a mean of 95  mmHg.  There was no pressure gradient across the aortic valve.   ANGIOGRAPHIC DATA:  Left ventricle:  Left ventricular systolic function was  in the lower limits of normal.  Ejection fraction of 50%.  There is mild  inferior and inferoapical hypokinesis.  There is no significant mitral  regurgitation.   Right coronary artery:  The right coronary artery is a large caliber vessel  and dominant.  The previously placed  3.5 x 13 in the proximal and 3.5 x 18  mm in the mid Cypher stents  placed on May 06, 2005 are widely patent.  Between the two stents there is at most 20% stenosis.  Proximal to the  proximal stent, there is a eccentric calcification, but there is no luminal  obstruction.   Left main:  The left main is a large caliber vessel, smooth and normal.   Circumflex:  The circumflex is a large caliber vessel gives origin OM-1 and  continues as an OM-2.  At the bifurcation of OM-1, there is a 30% stenosis  which is unchanged from prior cardiac catheterization.   Ramus intermediate:  The ramus intermediate is a very small vessel with  smooth and normal.   LAD:  The LAD is a large caliber vessel, gives origin to small diagonals.  Diagonal-3 is the largest of the vessels.  The LAD is smooth and is  otherwise normal.   IMPRESSION:  1. Low normal left ventricular systolic function with ejection fraction of      50%.  2. Elevated left ventricular end diastolic pressure probably secondary to      diastolic  dysfunction from diabetes and hypertension and coronary      artery disease.  3. No significant progression of coronary artery disease.   RECOMMENDATIONS:  The patient can be discharged home today.  The chest pain  is probably of noncardiac etiology and may indicate skeletal muscular pain.  He will follow up with Dr. Nanetta Batty.   TECHNIQUE:  Under usual sterile precautions using a 6-French right femoral  artery access, a 6-French multipurpose B2 catheter was advanced in the  ascending aorta over a 0.035 J- wire. The catheter was gently advanced in  the left ventricle, and the left ventricular pressure monitored. Hand  contrast injection of the left ventricular was performed both in the LAO and  RAO projections.  Catheter was flushed with saline, pulled back in the  ascending aorta PG recorded.  Right coronary artery was selectively engaged,  and angiography was performed. intracoronary nitroglycerin was  administered, and angiography was  performed.  Then the catheter utilized to  engage the left main coronary artery and angiography was repeated.  Then the  catheter was pulled out of the body in the usual fashion.  The patient  tolerated the procedure well.  Right renal angiography was performed through  the arterial access sheath, and the access was closed with Starclose with  excellent hemostasis.  No immediate complications noted.      Cristy Hilts. Jacinto Halim, MD  Electronically Signed     JRG/MEDQ  D:  11/26/2005  T:  11/27/2005  Job:  [id number]   cc:   Nanetta Batty, M.D.  Delaney Meigs, M.D.

## 2010-07-10 NOTE — Letter (Signed)
January 18, 2006    Nanetta Batty, M.D.  [phone], [address]  [place], [place] [address]   RE:  ALBA, KRIESEL  MRN:  [id number]  /  DOB:  1945-04-01   Dear Christiane Ha:   Mr. Houseworth came in today following his flutter ablation that was successful.  At that time, as you recall, he also was found to have a high right atrial  crystal tachycardia that unfortunately was very closely anatomically related  to the phrenic nerve.  We had planned to come back next month with the  electroanatomical mapping system to ablate it; however, at this point he  feels great and would like to defer this.   His current medications include verapamil 40 which we will discontinue and  metoprolol 75 twice a day which we will continue.   Other medications include Plavix, aspirin, Actos, and Protonix, and Zetia.   PHYSICAL EXAMINATION:  VITAL SIGNS:  His blood pressure is 128/66, pulse was  74.  LUNGS:  Clear.  HEART:  Sounds were regular.  EXTREMITIES:  Without edema.   Electrocardiogram, dated January 18, 2006, demonstrated a sinus rhythm at  65, with intervals of 0.18, 0.09, and 0.38.  The axis was 30 degrees.   IMPRESSION:  1. Atrial flutter, status post ablation.  2. 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   We will be glad to see Mr. Schlabach again as needed.  I hope this letter finds  you well.  He is to get his echocardiogram later this week at your office.   via Fax    Sincerely,      Duke Salvia, MD, [facility]  Electronically Signed    SCK/MedQ  DD: 01/18/2006  DT: 01/18/2006  Job #: [id number]

## 2010-07-10 NOTE — Cardiovascular Report (Signed)
NAME:  Richard [name], Richard [name]                          ACCOUNT NO.:  [id number]   MEDICAL RECORD NO.:  [id number]                   PATIENT TYPE:  INP   LOCATION:  3706                                 FACILITY:  [facility]   PHYSICIAN:  Salvadore Farber, M.D. [facility]         DATE OF BIRTH:  [birth year]   DATE OF PROCEDURE:  02/06/2002  DATE OF DISCHARGE:                              CARDIAC CATHETERIZATION   PROCEDURES:  Left heart catheterization, left ventriculogram, coronary  angiography.   INDICATIONS:  The patient is a 65 year old gentleman with diabetes mellitus,  family history of premature atherosclerotic disease, and tobacco abuse who  presents with having be awoken by chest pain on the day prior to admission.  Serial electrocardiograms and cardiac enzymes were normal.  However, given  his risk factors and chest pain concerning for unstable angina, he was  referred for diagnostic angiography.   DIAGNOSTIC TECHNIQUE:  Informed consent was obtained. Under 1% lidocaine  local anesthesia, a 6 French sheath was placed in the right femoral artery  using the modified Seldinger technique. Diagnostic angiography was performed  using JL4 and JR4 catheters.  Ventriculography was performed in the RAO  projection using a pigtail catheter.  The patient tolerated the procedure  well and was transferred to the holding room in stable condition.  The  sheath is to be removed there.   COMPLICATIONS:  None.   DIAGNOSTIC FINDINGS:  1. LV:  141/9/17.  Ejection fraction 70% without regional wall motion     abnormalities.  2. No aortic stenosis or mitral regurgitation.  3. Left main:  Angiographically normal.  4. LAD:  The LAD is a moderate sized vessel giving rise to two small and one     larger diagonal branch.  There is a 30% stenosis in the ostial portion of     the LAD.  There is a 20% stenosis just distal to the large third diagonal     branch.  There is a 40% stenosis of the distal vessel.  5.  Circumflex:  The circumflex is a moderate sized vessel giving rise to two     obtuse marginal branches.  There is a 30% stenosis of the distal vessel.  6. RCA:  The RCA is a moderate sized, dominant vessel.  There is a 30%     stenosis of the proximal vessel.   IMPRESSION/RECOMMENDATIONS:  The patient has mild nonobstructive coronary  disease.  With his normal electrocardiogram, normal enzymes and his mild  coronary disease, I doubt that myocardial ischemia is the etiology of his  presenting chest discomfort.  As he has been stable through this subsequent  hospitalization, we will plan on discharge home later today after his sheath  is removed and groin stabilized.  Salvadore Farber, M.D. [facility]    WED/MEDQ  D:  02/06/2002  T:  02/06/2002  Job:  [id number]   cc:   Colon Flattery  [address]  [place]  [place] [address]  Fax: [phone]   Rollene Rotunda, M.D. [facility]  [address]  [place]  [place] [address]  Fax: [phone]

## 2010-07-10 NOTE — H&P (Signed)
NAME:  Richard [name], Richard [name]                ACCOUNT NO.:  [id number]   MEDICAL RECORD NO.:  [id number]          PATIENT TYPE:  OIB   LOCATION:  6532                         FACILITY:  [facility]   PHYSICIAN:  Maple Mirza, PA   DATE OF BIRTH:  [birth year]   DATE OF ADMISSION:  12/20/2005  DATE OF DISCHARGE:                                HISTORY & PHYSICAL   This patient has no known drug allergies.  His primary care physician is Dr.  Joette Catching, his cardiologist, Dr. Elsie Lincoln, his electrophysiologist Dr.  Sherryl Manges.   PRESENTING CIRCUMSTANCE:  I am here for the electrophysiology study.   HISTORY OF PRESENT ILLNESS:  Richard [name] is a 65 year old male with  admissions x2 in early October 2007.  On October 4th, he was admitted with  chest pain.  A catheterization was done November 26, 2005.  It showed ejection  fraction 50%, and stents which had been placed in the proximal and mid-right  coronary artery were both patent.   He was directly readmitted November 28, 2005 with dyspnea, palpitations, chest  tightness radiating to the jaw.  In the emergency room, he was found to have  dysrhythmia.  His heart rate was 170.  It was a narrow complex  supraventricular tachycardia.  It broke with adenosine suggesting atrial  flutter.  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  The patient achieved rhythm control with metoprolol and verapamil.  He was discharged October 10th but had been seen by Dr. Graciela Husbands first who  suggested an office visit December 14, 2005.  He is set up for an  electrophysiology study at radiofrequency catheter ablation at that visit  for today, December 20, 2005.   ALLERGIES:  No known drug allergies.   MEDICATIONS:  1. Protonix 40 mg b.i.d.  2. Actos 45 mg daily.  3. Enteric-coated aspirin 81 mg daily.  4. Plavix 75 mg daily.  5. Metoprolol 50 mg twice daily.  6. Verapamil 40 mg every 8 hours.  7. Flexeril 10 mg t.i.d. as needed.  8. Relafen 500 mg  as needed.  9. Xanax 0.5 mg b.i.d.   SOCIAL HISTORY:  The patient has been married to a nurse for the last 38  years.  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  He does smoke less than one pack per day.  He has cut way  down.  He does not partake or alcoholic beverages or recreational drugs.   FAMILY HISTORY:  Mother is living at 66 who had bypass surgery in Aug 01, 2004.  Father died of emphysema.  He has two brothers.  One brother is status post  coronary artery bypass graft surgery.  One brother died of cancer.   REVIEW OF SYSTEMS:  One episode of palpitations which was very short-lived  but somewhat like the symptoms that brought him to the emergency room on  November 28, 2005.  They were not very symptomatic.  He is not giving any  history of fever, chills, weight loss or gain in the  last six months.  He  does not have any history of vertigo or presyncope.  He has no rashes or  nonhealing ulcerations to the lower extremities.  He is not complaining of  chest pain, lower extremity edema, orthopnea, or paroxysmal nocturnal  dyspnea.  He does have a recent history of palpitations and dizziness with  high heart rates.  No urinary problems.  No history of GI bleeding.  He has  assorted musculoskeletal conditions.  He is status post cervical fusion and  bilateral shoulder surgeries.  He has had no neurovascular symptoms  pertaining to neurologic etiology.   PHYSICAL EXAMINATION:  GENERAL:  Alert and oriented x3, no acute distress.  Temperature 97.1, blood pressure 128/71, pulse is 62 and regular,  respirations are 16, his weight is 200 pounds.  HEENT:  He is normocephalic, atraumatic.  Eyes:  Pupils equal, round, and  reactive to light.  Extraocular movements are intact.  No xanthomata.  His  sclerae are anicteric.  NECK:  Supple, no carotid bruits auscultated.  LUNGS:  Clear to auscultation bilaterally.  HEART:  Regular rate and rhythm without murmur.   ABDOMEN:  Soft, mildly obese, nondistended.  Bowel sounds are present.  EXTREMITIES:  No evidence of clubbing, cyanosis, or edema.  Posterior tibial  pulses 4/4 bilaterally.  Radial pulses 4/4 bilaterally.  NEUROLOGIC:  No deficits.   IMPRESSION:  Symptomatic supraventricular tachycardia:  Possibly two rhythms  A:  Atrial flutter.  B:  Atrioventricular nodal reentrant tachycardia.   PLAN:  Electrophysiology study, radiofrequency catheter ablation of atrial  dysrhythmias.           ______________________________  Maple Mirza, PA     GM/MEDQ  D:  12/20/2005  T:  12/20/2005  Job:  [id number]

## 2010-07-10 NOTE — Assessment & Plan Note (Signed)
[facility]                              CARDIOLOGY OFFICE NOTE   [name], [name]                         MRN:          [id number]  DATE:12/01/2005                            DOB:          03/26/1945    REFERRING PHYSICIAN:  Madaline Savage, M.D.   He is admitted for symptomatic palpitations which included prolonged heart  racing, dyspnea, diaphoresis, pre-syncope, and visual disturbances.  Inspection of telemetry strips and electrocardiograms by Dr. Sherryl Manges  revealed that with Adenosine challenge, the patient exhibited atrial  flutter.  Other telemetry strips show evidence of a short R-P tachy  arrhythmia, possibly AVRT re-entry mechanism.  There is also the  possibility, although a vague one, that the patient has an atrial focus  causing an atrial tachycardia.   [name] is a 65 year old who has a history of stent implantation to the  right coronary artery in the proximal and mid portion May 06, 2005.  He  said he has had heart affects since the procedure.  At first they were  felt as very transient blips lasting brief seconds.  Their frequency,  duration, and symptomatology have become more pronounced.  Most previously,  he felt a flash of heat lasting 2-3 seconds.  He presented to the emergency  room on October 7 with prolonged heart racing, dyspnea, diaphoresis, pre-  syncope, and visual disturbances.  I thought I had lost it, he said.  Electrocardiograms show a narrow complex tachycardia at greater than 150  beats per minute.   His previous medicines have been Toprol 25 mg b.i.d. at home.  He was begun  on Rythmol 225 mg b.i.d. and at the time of examination, he is having just  very transient flashes of heat which the telemetry shows as an abrupt onset  and offset lasting 2-3 seconds of tachy arrhythmia.  He was seen by Dr.  Sherryl Manges in consultation and after examination of telemetry strips and  electrocardiogram,  identified at least two of the tachy arrhythmias as  mentioned above, he prescribed Rythmol could be decreased and the patient be  put on Metoprolol 50 mg b.i.d. and Verapamil 40 mg every 8 hours.  On this  medication, the patient has been fairly well locked into sinus rhythm for  the last 36 hours and wishes to go home on October 10.  It is suggested by  [facility] that we see him in follow up as soon as  possible.  The patient will be set up for electrophysiology study, radio  frequency catheter ablation, which could possibly eliminate two tachy  arrhythmias, one atrial flutter and two a short RP tachy arrhythmia.  Follow  up appointment is with Dr. Sherryl Manges Tuesday, October 23, at 11:30 in the  morning.   The patient is discharged on the following medications:  1. Protonix 40 mg b.i.d.  2. Actos 45 mg daily.  3. Enteric coated aspirin 81 mg daily.  4. Plavix 75 mg daily.  5. Metoprolol 50 mg b.i.d.  6. Verapamil 40 mg q.8h.  7. Flexeril 10  mg t.i.d. as needed.  8. Relafen 500 mg as needed.  9. Xanax 0.5 mg p.o. b.i.d.   1. The past medical history of this patient also includes history of      coronary artery disease with ejection fraction 50% at catheterization      study November 26, 2005, stents to the proximal and mid RCA are patent on      the study November 26, 2005.  2. Diabetes.  3. Dyslipidemia.  4. Family history of coronary artery disease.  5. Status post cervical fusion.   We will see him on October 23.      ______________________________  Maple Mirza, PA    ______________________________  Duke Salvia, MD, [facility]    GM/MedQ  DD:  12/01/2005  DT:  12/02/2005  Job #:  [id number]

## 2010-07-10 NOTE — Discharge Summary (Signed)
NAME:  Richard [name], Richard [name]                          ACCOUNT NO.:  [id number]   MEDICAL RECORD NO.:  [id number]                   PATIENT TYPE:  INP   LOCATION:  3706                                 FACILITY:  [facility]   PHYSICIAN:  Rollene Rotunda, M.D. [facility]            DATE OF BIRTH:  06/11/1945   DATE OF ADMISSION:  02/05/2002  DATE OF DISCHARGE:  02/07/2002                           DISCHARGE SUMMARY - REFERRING   PROCEDURES:  Coronary angiogram December 16.   REASON FOR ADMISSION:  Please refer to dictated admission note.   LABORATORY DATA:  Cardiac enzymes; Normal serial markers (x 3).  Lipid  profile: Total cholesterol 201, triglycerides 192, HDL 40, LDL 123 (ratio  5.0).  TSH 2.0.  Serial CBCs normal.  Normal electrolytes and renal  function.  Glucose 155 on admission, hemoglobin A1C 10.6.  Normal liver  enzymes.   Admission chest x-ray: No active disease (final report).   HOSPITAL COURSE:  The patient was admitted for further evaluation of  symptoms suggestive of crescendo angina pectoris.  He presented with  multiple cardiac risk factors, but no prior history of coronary artery  disease.  He was not on any medications prior to admission and was placed on  aspirin, beta blocker, Lovenox, and Protonix.   Serial cardiac enzymes were all within normal limits.   Coronary angiogram, performed by Dr. Randa Evens (see report for full  details), revealed nonobstructive coronary artery disease with normal left  ventricle, EF 70%; no WMAs.  Specifically, there were 30% proximal, 20% mid,  40% distal LAD; 20% mid, 30% distal circumflex; 50% proximal RCA.   Dr. Samule Ohm doubted that the etiology of the chest discomfort was ischemic in  nature.  Aggressive secondary prevention was recommended and reviewed with  the patient by Dr. Antoine Poche at time of discharge.   As noted earlier, the patient was not on any medications prior to this  admission.  In addition to his discharge regimen,  recommendations also to  initiate diabetic medication.  This will be deferred to Dr. Dewaine Conger with whom  patient will follow up later this weeks.   DISCHARGE MEDICATIONS:  1. Coated aspirin 325 mg q.d.  2. Protonix 40 mg q.d.  3. Toprol XL 50 mg q.d.  4. Zocor 20 mg q.h.s.  5. Nitrostat 0.4 mg p.r.n.   DISCHARGE INSTRUCTIONS:  1. No heavy lifting, strenuous activity, or driving x 2 days.  2. Low-fat, low cholesterol diet.  3. Call the office if there is any swelling or bleeding in the groin.  4. The patient is strongly urged to stop smoking tobacco.  5. The patient is scheduled to return to Dr. Colon Flattery tomorrow, December     18, at 11 a.m. for initiation of diabetes mellitus management.  6. The patient is scheduled to follow up with Dr. Rollene Rotunda on January     21 at 1:45 p.m. at our [facility].  DISCHARGE DIAGNOSES:  1. Nonobstructive coronary artery disease.  a  Normal serial cardiac enzymes.  b.  Coronary angiogram December 15.  c.  Normal left ventricle.  1. Multiple cardiac risk factors.     a. Type 2 diabetes mellitus (new onset.     b. Longstanding tobacco.     c. Dyslipidemia.     d. Family history of premature coronary artery disease.     Gene Serpe, P.A. [facility]                      Rollene Rotunda, M.D. [facility]    GS/MEDQ  D:  02/07/2002  T:  02/07/2002  Job:  [id number]   cc:   Colon Flattery, D.O.

## 2010-09-23 ENCOUNTER — Emergency Department (HOSPITAL_COMMUNITY): Payer: Medicare Other

## 2010-09-23 ENCOUNTER — Encounter (HOSPITAL_COMMUNITY): Payer: Self-pay | Admitting: Radiology

## 2010-09-23 ENCOUNTER — Emergency Department (HOSPITAL_COMMUNITY)
Admission: EM | Admit: 2010-09-23 | Discharge: 2010-09-23 | Disposition: A | Discharge status: Home / Self Care | Payer: Medicare Other | Attending: Emergency Medicine | Admitting: Emergency Medicine

## 2010-09-23 DIAGNOSIS — M542 Cervicalgia: Secondary | ICD-10-CM | POA: Insufficient documentation

## 2010-09-23 DIAGNOSIS — Z79899 Other long term (current) drug therapy: Secondary | ICD-10-CM | POA: Insufficient documentation

## 2010-09-23 DIAGNOSIS — I2510 Atherosclerotic heart disease of native coronary artery without angina pectoris: Secondary | ICD-10-CM | POA: Insufficient documentation

## 2010-09-23 DIAGNOSIS — R5381 Other malaise: Secondary | ICD-10-CM | POA: Insufficient documentation

## 2010-09-23 DIAGNOSIS — R209 Unspecified disturbances of skin sensation: Secondary | ICD-10-CM | POA: Insufficient documentation

## 2010-09-23 DIAGNOSIS — E785 Hyperlipidemia, unspecified: Secondary | ICD-10-CM | POA: Insufficient documentation

## 2010-09-23 DIAGNOSIS — H9209 Otalgia, unspecified ear: Secondary | ICD-10-CM | POA: Insufficient documentation

## 2010-09-23 DIAGNOSIS — R51 Headache: Secondary | ICD-10-CM | POA: Insufficient documentation

## 2010-09-23 DIAGNOSIS — M4802 Spinal stenosis, cervical region: Secondary | ICD-10-CM | POA: Insufficient documentation

## 2010-09-23 DIAGNOSIS — R5383 Other fatigue: Secondary | ICD-10-CM | POA: Insufficient documentation

## 2010-09-23 DIAGNOSIS — I252 Old myocardial infarction: Secondary | ICD-10-CM | POA: Insufficient documentation

## 2010-09-23 DIAGNOSIS — E119 Type 2 diabetes mellitus without complications: Secondary | ICD-10-CM | POA: Insufficient documentation

## 2010-09-23 DIAGNOSIS — H538 Other visual disturbances: Secondary | ICD-10-CM | POA: Insufficient documentation

## 2010-09-23 LAB — DIFFERENTIAL
Basophils Absolute: 0 10*3/uL (ref 0.0–0.1)
Lymphocytes Relative: 34 % (ref 12–46)
Monocytes Absolute: 0.4 10*3/uL (ref 0.1–1.0)
Monocytes Relative: 6 % (ref 3–12)
Neutro Abs: 4.4 10*3/uL (ref 1.7–7.7)
Neutrophils Relative %: 57 % (ref 43–77)

## 2010-09-23 LAB — CBC
HCT: 41 % (ref 39.0–52.0)
Hemoglobin: 14.4 g/dL (ref 13.0–17.0)
MCHC: 35.1 g/dL (ref 30.0–36.0)
RBC: 4.79 MIL/uL (ref 4.22–5.81)

## 2010-09-23 LAB — COMPREHENSIVE METABOLIC PANEL
ALT: 24 U/L (ref 0–53)
BUN: 18 mg/dL (ref 6–23)
CO2: 26 mEq/L (ref 19–32)
Calcium: 9.8 mg/dL (ref 8.4–10.5)
Creatinine, Ser: 0.93 mg/dL (ref 0.50–1.35)
GFR calc Af Amer: >60 mL/min (ref >60)
GFR calc non Af Amer: >60 mL/min (ref >60)
Glucose, Bld: 153 mg/dL — ABNORMAL HIGH (ref 70–99)
Total Protein: 7.4 g/dL (ref 6.0–8.3)

## 2010-09-23 LAB — CK TOTAL AND CKMB (NOT AT ARMC)
CK, MB: 2.9 ng/mL (ref 0.3–4.0)
Total CK: 117 U/L (ref 7–232)

## 2010-09-23 LAB — POCT I-STAT, CHEM 8
BUN: 18 mg/dL (ref 6–23)
Creatinine, Ser: 1.1 mg/dL (ref 0.50–1.35)
Potassium: 4.3 mEq/L (ref 3.5–5.1)
Sodium: 140 mEq/L (ref 135–145)

## 2010-09-23 LAB — PROTIME-INR
INR: 1 (ref 0.00–1.49)
Prothrombin Time: 13.4 seconds (ref 11.6–15.2)

## 2010-09-23 LAB — APTT: aPTT: 27 seconds (ref 24–37)

## 2010-09-23 MED ORDER — IOHEXOL 300 MG/ML  SOLN
50.0000 mL | Freq: Once | INTRAMUSCULAR | Status: AC | PRN
  Administered 2010-09-23: 50 mL via INTRAVENOUS

## 2010-10-01 NOTE — Consult Note (Signed)
NAME:  KAISER, BELLUOMINI NO.:  [id number]  MEDICAL RECORD NO.:  [id number]  LOCATION:  [facility]                         FACILITY:  [facility]  PHYSICIAN:  Thana Farr, MD    DATE OF BIRTH:  01-26-1946  DATE OF CONSULTATION:  09/23/2010 DATE OF DISCHARGE:  09/23/2010                                CONSULTATION   CONSULT CALLED BY:  Celene Kras, MD  CHIEF COMPLAINT:  Blurry vision, headache, and tingling around the mid and left upper extremity.  HISTORY:  Mr. Cinquemani is a 65 year old male who awakened at approximately 3 o'clock this morning for work and has no complaints.  At approximately 7:30, had acute onset of blurry vision, tingling, and numbness.  The numbness was perioral and in his left upper extremity.  Also complained of severe headache.  The patient has had some right neck and head pain for the past week as well that has been progressively worsening and was significantly worse this morning.  The patient was to see his primary care physician who had the patient present for evaluation.  Code stroke was called.  Initial NIH stroke scale was 2.  Symptoms were improving on presentation.  PAST MEDICAL HISTORY: 1. Left leg fracture 1 year ago. 2. Coronary artery disease status post stent placement. 3. Diabetes. 4. Cervical fusion.  MEDICATIONS:  Metformin, aspirin, Actos, glipizide.  ALLERGIES:  No known drug allergies.  SOCIAL HISTORY:  The patient is married.  He smokes a pack a day of cigarettes.  There is no history of alcohol or illicit drug abuse.  PHYSICAL EXAMINATION:  VITAL SIGNS:  Blood pressure 133/76, heart rate 58, respiratory rate 17, O2 sat 100%. NEUROLOGIC:  On mental status, the patient is alert and oriented.  He can follow commands without difficulty.  Speech is fluent.  On cranial nerve testing, II disk flat bilaterally.  Visual fields full.  The patient does have blurry vision.  Her blurry vision improved on looking towards the right.   III, IV, and VI; there is decreased upward gaze with the left eye.  Otherwise, extraocular movements are intact.  Pupils are reactive.  There is some nystagmus on looking upward as well.  V and VII, smile symmetric.  VIII grossly intact.  IX and VII, positive gag. XI bilateral shoulder shrug and XII midline tongue extension.  On motor exam, the patient is 5/5 excluding left hip flexion which is 4+/5.  On sensory testing, pinprick and light touch are intact bilaterally.  Deep tendon reflexes are 1+ in the upper extremities and at the knees and absent at the ankles.  Plantar is upgoing on the left and downgoing on the right.  On cerebellar testing, finger-to-nose and heel-to-shin intact.  Laboratory data shows a sodium of 140, potassium of 4.3, chloride of 104, bicarb 27, BUN 18 creatinine 1.1, glucose 151.  White blood cell count 7.7, platelet count 179, hemoglobin and hematocrit 14.6 and 43 respectively.  INR 1.0.  CT shows no acute changes.  ASSESSMENT:  Mr. Potash is a 65 year old male presenting with blurry vision, headache, numbness in the mouth and left upper extremity. Paresthesias have cleared, but cannot rule out the possibility of  a brainstem event therefore further work up is indicated.    PLAN: 1. DWI MRI.  If unremarkable, we will consider a CTA of the head and     neck. 2. Acetylcholine receptor antibodies, sed rate, TSH. 3. Ophthalmology consult.  Patient considered critically ill and requires high level decision making in his management.  90 minutes were spent in face-to-face management and care of the patient.         ______________________________ Thana Farr, MD     LR/MEDQ  D:  09/23/2010  T:  09/24/2010  Job:  [id number]  Electronically Signed by Thana Farr MD on 10/01/2010 10:07:17 AM

## 2010-12-01 LAB — URINALYSIS, ROUTINE W REFLEX MICROSCOPIC
Bilirubin Urine: NEGATIVE
Glucose, UA: >1000 — AB
Hgb urine dipstick: NEGATIVE
Specific Gravity, Urine: 1.022

## 2010-12-01 LAB — POCT CARDIAC MARKERS
CKMB, poc: 1.7
Myoglobin, poc: 83.6
Operator id: 288331

## 2010-12-01 LAB — I-STAT 8, (EC8 V) (CONVERTED LAB)
BUN: 16
Bicarbonate: 24
HCT: 47
Hemoglobin: 16
Operator id: 285841
Potassium: 4
Sodium: 139
TCO2: 25

## 2010-12-01 LAB — CBC
HCT: 38.2 — ABNORMAL LOW
HCT: 42.2
Hemoglobin: 14.6
Platelets: 168
RBC: 4.44
RDW: 14.7
WBC: 6.6
WBC: 8.1

## 2010-12-01 LAB — TROPONIN I
Troponin I: 0.01
Troponin I: 0.02

## 2010-12-01 LAB — COMPREHENSIVE METABOLIC PANEL
AST: 23
Albumin: 3.5
Calcium: 8.5
Creatinine, Ser: 0.87
GFR calc Af Amer: >60
GFR calc non Af Amer: >60
Total Protein: 5.9 — ABNORMAL LOW

## 2010-12-01 LAB — PROTIME-INR: INR: 1.1

## 2010-12-01 LAB — CK TOTAL AND CKMB (NOT AT ARMC)
CK, MB: 2.9
CK, MB: 3.3
Relative Index: 1.5
Relative Index: 1.5
Relative Index: 1.6
Total CK: 161
Total CK: 210

## 2010-12-01 LAB — HEMOGLOBIN A1C: Hgb A1c MFr Bld: 9.4 — ABNORMAL HIGH

## 2010-12-01 LAB — DIFFERENTIAL
Basophils Absolute: 0
Lymphocytes Relative: 25
Lymphs Abs: 1.6
Monocytes Absolute: 0.5
Neutro Abs: 4.3

## 2010-12-01 LAB — BASIC METABOLIC PANEL
BUN: 11
Creatinine, Ser: 0.91
GFR calc Af Amer: >60
GFR calc non Af Amer: >60
Potassium: 3.9

## 2010-12-01 LAB — TSH: TSH: 1.676

## 2010-12-01 LAB — URINE MICROSCOPIC-ADD ON

## 2010-12-01 LAB — POCT I-STAT CREATININE: Operator id: 285841

## 2010-12-01 LAB — APTT: aPTT: 137 — ABNORMAL HIGH

## 2010-12-01 LAB — LIPID PANEL
LDL Cholesterol: 58
Total CHOL/HDL Ratio: 2.8
VLDL: 15

## 2010-12-01 LAB — HEPARIN LEVEL (UNFRACTIONATED)
Heparin Unfractionated: 0.42
Heparin Unfractionated: 0.78 — ABNORMAL HIGH

## 2010-12-08 LAB — CBC
HCT: 39.7
HCT: 41
Hemoglobin: 13.5
Hemoglobin: 14.1
MCHC: 33.8
MCHC: 34
MCHC: 34.3
MCHC: 34.4
MCV: 85.9
MCV: 87.5
Platelets: 148 — ABNORMAL LOW
Platelets: 166
RBC: 4.63
RDW: 14.8 — ABNORMAL HIGH
RDW: 14.9 — ABNORMAL HIGH

## 2010-12-08 LAB — LIPID PANEL
HDL: 39 — ABNORMAL LOW
Triglycerides: 110
VLDL: 22

## 2010-12-08 LAB — I-STAT 8, (EC8 V) (CONVERTED LAB)
BUN: 23
Glucose, Bld: 170 — ABNORMAL HIGH
Hemoglobin: 14.3
Potassium: 4
Sodium: 137

## 2010-12-08 LAB — DIFFERENTIAL
Basophils Absolute: 0
Basophils Relative: 0
Basophils Relative: 1
Eosinophils Absolute: 0.2
Eosinophils Relative: 4
Lymphs Abs: 2.5
Monocytes Relative: 7
Neutro Abs: 4
Neutrophils Relative %: 52

## 2010-12-08 LAB — PROTIME-INR: Prothrombin Time: 13.5

## 2010-12-08 LAB — MAGNESIUM: Magnesium: 1.8

## 2010-12-08 LAB — COMPREHENSIVE METABOLIC PANEL
Alkaline Phosphatase: 63
BUN: 21
Glucose, Bld: 151 — ABNORMAL HIGH
Potassium: 4.5
Total Protein: 6.3

## 2010-12-08 LAB — HEPARIN LEVEL (UNFRACTIONATED)
Heparin Unfractionated: 0.37
Heparin Unfractionated: 1.05 — ABNORMAL HIGH
Heparin Unfractionated: 1.55 — ABNORMAL HIGH

## 2010-12-08 LAB — CARDIAC PANEL(CRET KIN+CKTOT+MB+TROPI)
CK, MB: 2.8
Troponin I: 0.02

## 2010-12-08 LAB — BASIC METABOLIC PANEL
BUN: 13
CO2: 30
CO2: 31
Chloride: 102
Creatinine, Ser: 0.96
Glucose, Bld: 189 — ABNORMAL HIGH
Glucose, Bld: 258 — ABNORMAL HIGH
Potassium: 4.4
Sodium: 137

## 2010-12-08 LAB — POCT CARDIAC MARKERS
CKMB, poc: 1.7
Myoglobin, poc: 56.4
Operator id: 261381

## 2010-12-08 LAB — TROPONIN I: Troponin I: 0.01

## 2010-12-08 LAB — CK TOTAL AND CKMB (NOT AT ARMC)
CK, MB: 3.4
Total CK: 197

## 2012-11-14 ENCOUNTER — Encounter (HOSPITAL_COMMUNITY): Payer: Self-pay | Admitting: Vascular Surgery

## 2012-11-14 ENCOUNTER — Inpatient Hospital Stay (HOSPITAL_COMMUNITY)
Admission: EM | Admit: 2012-11-14 | Discharge: 2012-11-17 | DRG: 195 | Disposition: A | Discharge status: Home / Self Care | Payer: Medicare Other | Attending: Internal Medicine | Admitting: Internal Medicine

## 2012-11-14 DIAGNOSIS — I10 Essential (primary) hypertension: Secondary | ICD-10-CM | POA: Diagnosis present

## 2012-11-14 DIAGNOSIS — J189 Pneumonia, unspecified organism: Principal | ICD-10-CM | POA: Diagnosis present

## 2012-11-14 DIAGNOSIS — Z9861 Coronary angioplasty status: Secondary | ICD-10-CM

## 2012-11-14 DIAGNOSIS — I2510 Atherosclerotic heart disease of native coronary artery without angina pectoris: Secondary | ICD-10-CM

## 2012-11-14 DIAGNOSIS — I252 Old myocardial infarction: Secondary | ICD-10-CM

## 2012-11-14 DIAGNOSIS — F17200 Nicotine dependence, unspecified, uncomplicated: Secondary | ICD-10-CM | POA: Diagnosis present

## 2012-11-14 DIAGNOSIS — I25110 Atherosclerotic heart disease of native coronary artery with unstable angina pectoris: Secondary | ICD-10-CM | POA: Diagnosis present

## 2012-11-14 DIAGNOSIS — Z7982 Long term (current) use of aspirin: Secondary | ICD-10-CM

## 2012-11-14 DIAGNOSIS — Z79899 Other long term (current) drug therapy: Secondary | ICD-10-CM

## 2012-11-14 DIAGNOSIS — E118 Type 2 diabetes mellitus with unspecified complications: Secondary | ICD-10-CM | POA: Diagnosis present

## 2012-11-14 DIAGNOSIS — E119 Type 2 diabetes mellitus without complications: Secondary | ICD-10-CM | POA: Diagnosis present

## 2012-11-14 LAB — POCT I-STAT, CHEM 8
BUN: 29 mg/dL — ABNORMAL HIGH (ref 6–23)
Calcium, Ion: 1.11 mmol/L — ABNORMAL LOW (ref 1.13–1.30)
Glucose, Bld: 267 mg/dL — ABNORMAL HIGH (ref 70–99)
TCO2: 25 mmol/L (ref 0–100)

## 2012-11-14 LAB — GLUCOSE, CAPILLARY

## 2012-11-14 LAB — CBC WITH DIFFERENTIAL/PLATELET
Basophils Absolute: 0.1 10*3/uL (ref 0.0–0.1)
Eosinophils Relative: 0 % (ref 0–5)
HCT: 37.2 % — ABNORMAL LOW (ref 39.0–52.0)
Lymphocytes Relative: 5 % — ABNORMAL LOW (ref 12–46)
MCHC: 34.9 g/dL (ref 30.0–36.0)
MCV: 85.3 fL (ref 78.0–100.0)
Monocytes Absolute: 1 10*3/uL (ref 0.1–1.0)
RDW: 14.1 % (ref 11.5–15.5)
WBC: 19.6 10*3/uL — ABNORMAL HIGH (ref 4.0–10.5)

## 2012-11-14 MED ORDER — DEXTROSE 5 % IV SOLN
500.0000 mg | Freq: Once | INTRAVENOUS | Status: AC
  Administered 2012-11-14: 500 mg via INTRAVENOUS
  Filled 2012-11-14: qty 500

## 2012-11-14 MED ORDER — LISINOPRIL 10 MG PO TABS
10.0000 mg | ORAL_TABLET | Freq: Every day | ORAL | Status: DC
  Administered 2012-11-14 – 2012-11-17 (×4): 10 mg via ORAL
  Filled 2012-11-14 (×4): qty 1

## 2012-11-14 MED ORDER — SODIUM CHLORIDE 0.9 % IV BOLUS (SEPSIS)
500.0000 mL | Freq: Once | INTRAVENOUS | Status: AC
  Administered 2012-11-14: 500 mL via INTRAVENOUS

## 2012-11-14 MED ORDER — ACETAMINOPHEN 325 MG PO TABS
650.0000 mg | ORAL_TABLET | Freq: Four times a day (QID) | ORAL | Status: DC | PRN
  Administered 2012-11-14 – 2012-11-16 (×2): 650 mg via ORAL
  Filled 2012-11-14 (×2): qty 2

## 2012-11-14 MED ORDER — METOPROLOL TARTRATE 50 MG PO TABS
50.0000 mg | ORAL_TABLET | Freq: Two times a day (BID) | ORAL | Status: DC
  Administered 2012-11-14 – 2012-11-17 (×6): 50 mg via ORAL
  Filled 2012-11-14 (×7): qty 1

## 2012-11-14 MED ORDER — DEXTROSE 5 % IV SOLN
1.0000 g | Freq: Once | INTRAVENOUS | Status: AC
  Administered 2012-11-14: 1 g via INTRAVENOUS
  Filled 2012-11-14: qty 10

## 2012-11-14 MED ORDER — INSULIN ASPART 100 UNIT/ML ~~LOC~~ SOLN
0.0000 [IU] | Freq: Every day | SUBCUTANEOUS | Status: DC
Start: 2012-11-14 — End: 2012-11-17
  Administered 2012-11-14: 3 [IU] via SUBCUTANEOUS
  Administered 2012-11-15: 2 [IU] via SUBCUTANEOUS

## 2012-11-14 MED ORDER — ASPIRIN 325 MG PO TABS
325.0000 mg | ORAL_TABLET | Freq: Every day | ORAL | Status: DC
  Administered 2012-11-15 – 2012-11-17 (×3): 325 mg via ORAL
  Filled 2012-11-14 (×3): qty 1

## 2012-11-14 MED ORDER — GLIPIZIDE 10 MG PO TABS
10.0000 mg | ORAL_TABLET | Freq: Two times a day (BID) | ORAL | Status: DC
  Administered 2012-11-15 – 2012-11-17 (×6): 10 mg via ORAL
  Filled 2012-11-14 (×7): qty 1

## 2012-11-14 MED ORDER — MORPHINE SULFATE 4 MG/ML IJ SOLN
4.0000 mg | Freq: Once | INTRAMUSCULAR | Status: AC
  Administered 2012-11-14: 4 mg via INTRAVENOUS
  Filled 2012-11-14: qty 1

## 2012-11-14 MED ORDER — PIPERACILLIN-TAZOBACTAM 3.375 G IVPB
3.3750 g | Freq: Three times a day (TID) | INTRAVENOUS | Status: DC
  Administered 2012-11-14 – 2012-11-15 (×2): 3.375 g via INTRAVENOUS
  Filled 2012-11-14 (×4): qty 50

## 2012-11-14 MED ORDER — INSULIN ASPART 100 UNIT/ML ~~LOC~~ SOLN
0.0000 [IU] | Freq: Three times a day (TID) | SUBCUTANEOUS | Status: DC
  Administered 2012-11-15: 09:00:00 2 [IU] via SUBCUTANEOUS
  Administered 2012-11-15: 19:00:00 1 [IU] via SUBCUTANEOUS
  Administered 2012-11-16 (×2): 3 [IU] via SUBCUTANEOUS
  Administered 2012-11-16 – 2012-11-17 (×3): 2 [IU] via SUBCUTANEOUS

## 2012-11-14 MED ORDER — ENOXAPARIN SODIUM 40 MG/0.4ML ~~LOC~~ SOLN
40.0000 mg | SUBCUTANEOUS | Status: DC
  Administered 2012-11-14 – 2012-11-16 (×3): 40 mg via SUBCUTANEOUS
  Filled 2012-11-14 (×4): qty 0.4

## 2012-11-14 MED ORDER — METFORMIN HCL 500 MG PO TABS
1000.0000 mg | ORAL_TABLET | Freq: Two times a day (BID) | ORAL | Status: DC
  Administered 2012-11-15 – 2012-11-17 (×6): 1000 mg via ORAL
  Filled 2012-11-14 (×7): qty 2

## 2012-11-14 MED ORDER — SODIUM CHLORIDE 0.9 % IV SOLN
INTRAVENOUS | Status: DC
  Administered 2012-11-14 – 2012-11-17 (×5): via INTRAVENOUS

## 2012-11-14 NOTE — ED Provider Notes (Signed)
CSN: [id number]     Arrival date & time 11/14/12  1702 History   First MD Initiated Contact with Patient 11/14/12 1718     Chief Complaint  Patient presents with  . Pneumonia   (Consider location/radiation/quality/duration/timing/severity/associated sxs/prior Treatment) Patient is a 67 y.o. male presenting with pneumonia. The history is provided by the patient.  Pneumonia Associated symptoms include shortness of breath. Pertinent negatives include no chest pain, no abdominal pain and no headaches.   patient presents with feeling fatigued for the last 3 days. He's had fevers up to 102. He was seen at [facility] urgent care and had an x-ray that showed pneumonia. He was sent in for further evaluation and treatment. He has some dull right-sided chest pain.  Past Medical History  Diagnosis Date  . Diabetes mellitus   . Hypertension   . MI (myocardial infarction)    Past Surgical History  Procedure Laterality Date  . Coronary stent placement    . Cervical neck fusion    . Carpal tunnel release    . Rotator cuff repair Right   . Tibia fracture surgery     History reviewed. No pertinent family history. History  Substance Use Topics  . Smoking status: Current Every Day Smoker -- 1.00 packs/day  . Smokeless tobacco: Not on file  . Alcohol Use: No    Review of Systems  Constitutional: Positive for fever and fatigue. Negative for activity change and appetite change.  HENT: Negative for neck stiffness.   Eyes: Negative for pain.  Respiratory: Positive for shortness of breath. Negative for chest tightness.   Cardiovascular: Negative for chest pain and leg swelling.  Gastrointestinal: Negative for nausea, vomiting, abdominal pain and diarrhea.  Genitourinary: Negative for flank pain.  Musculoskeletal: Negative for back pain.  Skin: Negative for rash.  Neurological: Negative for weakness, numbness and headaches.  Psychiatric/Behavioral: Negative for behavioral problems.    Allergies   Review of patient's allergies indicates no known allergies.  Home Medications   Current Outpatient Rx  Name  Route  Sig  Dispense  Refill  . acetaminophen (TYLENOL) 325 MG tablet   Oral   Take 325 mg by mouth every 6 (six) hours as needed for fever.         [name] aspirin 325 MG tablet   Oral   Take 325 mg by mouth daily.         [name] glipiZIDE (GLUCOTROL) 10 MG tablet   Oral   Take 10 mg by mouth 2 (two) times daily before a meal.         . lisinopril (PRINIVIL,ZESTRIL) 10 MG tablet   Oral   Take 10 mg by mouth daily.         . metFORMIN (GLUCOPHAGE) 1000 MG tablet   Oral   Take 1,000 mg by mouth 2 (two) times daily with a meal.         . metoprolol (LOPRESSOR) 100 MG tablet   Oral   Take 100 mg by mouth daily.          BP 137/69  Pulse 91  Temp(Src) 99.5 F (37.5 C) (Oral)  Resp 12  SpO2 100% Physical Exam  Nursing note and vitals reviewed. Constitutional: He is oriented to person, place, and time. He appears well-developed and well-nourished.  Patient appears uncomfortable  HENT:  Head: Normocephalic and atraumatic.  Eyes: EOM are normal. Pupils are equal, round, and reactive to light.  Neck: Normal range of motion. Neck supple.  Cardiovascular: Normal  rate, regular rhythm and normal heart sounds.   No murmur heard. Pulmonary/Chest: Effort normal and breath sounds normal.  Mild dyspnea  Abdominal: Soft. Bowel sounds are normal. He exhibits no distension and no mass. There is no tenderness. There is no rebound and no guarding.  Musculoskeletal: Normal range of motion. He exhibits no edema.  Neurological: He is alert and oriented to person, place, and time. No cranial nerve deficit.  Skin: Skin is warm and dry.  Psychiatric: He has a normal mood and affect.    ED Course  Procedures (including critical care time) Labs Review Labs Reviewed  CBC WITH DIFFERENTIAL - Abnormal; Notable for the following:    WBC 19.6 (*)    HCT 37.2 (*)    Neutrophils  Relative % 90 (*)    Neutro Abs 17.6 (*)    Lymphocytes Relative 5 (*)    All other components within normal limits  POCT I-STAT, CHEM 8 - Abnormal; Notable for the following:    Sodium 133 (*)    BUN 29 (*)    Glucose, Bld 267 (*)    Calcium, Ion 1.11 (*)    All other components within normal limits   Imaging Review No results found.  MDM   1. CAP (community acquired pneumonia)    Patient with CAP. Still appears fatigued. Will admit.    Juliet Rude. Rubin Payor, MD 11/14/12 [phone]

## 2012-11-14 NOTE — Progress Notes (Signed)
Pt orientation to unit, room and routine. Information packet given to patient and family. Admission INP armband ID verified with patient/family, and in place. SR up x 2, fall risk assessment complete with Patient and family verbalizing understanding of risks associated with falls. Pt verbalizes an understanding of how to use the call bell and to call for help before getting out of bed.  Will cont to monitor and assist as needed.  Gilman Schmidt, RN

## 2012-11-14 NOTE — H&P (Signed)
[facility] History and Physical  Patient: Richard [name]  ZOX:[id number]  DOB: 1946-02-12  DOA: 11/14/2012  Referring physician: Dr. Rubin Payor PCP: Josue Hector, MD  Consults:     Chief Complaint: Cough fevers and right-sided chest pain  HPI: Richard [name] is a 67 y.o. male with Past medical history of diabetes mellitus, hypertension, coronary artery disease status post PCI. He presented today with the complaint of cough and feeling of generalized tired and lethargy that has been ongoing since last 3 days. He also has started having fever since Sunday. Since this morning he started having pain on the right side of the chest which is associated with deep breathing and cough. The pain is sharp and stabbing in nature also gets worse with movement. He denies any recent immobilization or surgery or long travel or leg swelling or calf tenderness. He also denies any aspiration on his food. He denies any prior history of pneumonia or sick contacts. He did complain of some headache before but no neck pain or neck stiffness or photophobia. He denies any abdominal symptoms including nausea vomiting abdominal pain diarrhea. He occasionally complains of BPH symptoms which appear chronic for him. He is an active smoker and smokes one pack a day has been smoking since last 44 years. Denies any weight loss, lumps or bumps anywhere, chronic cough.  Review of Systems: as mentioned in the history of present illness.  A Comprehensive review of the other systems is negative.  Past Medical History  Diagnosis Date  . Diabetes mellitus   . Hypertension   . MI (myocardial infarction)    Past Surgical History  Procedure Laterality Date  . Coronary stent placement    . Cervical neck fusion    . Carpal tunnel release    . Rotator cuff repair Right   . Tibia fracture surgery     Social History:  reports that he has been smoking.  He does not have any smokeless tobacco history on file. He  reports that he does not drink alcohol or use illicit drugs. Patient is coming from home. Independent for most of his  ADL.  No Known Allergies  History reviewed. No pertinent family history.  Prior to Admission medications   Medication Sig Start Date End Date Taking? Authorizing Provider  acetaminophen (TYLENOL) 325 MG tablet Take 325 mg by mouth every 6 (six) hours as needed for fever.   Yes Historical Provider, MD  aspirin 325 MG tablet Take 325 mg by mouth daily.   Yes Historical Provider, MD  glipiZIDE (GLUCOTROL) 10 MG tablet Take 10 mg by mouth 2 (two) times daily before a meal.   Yes Historical Provider, MD  lisinopril (PRINIVIL,ZESTRIL) 10 MG tablet Take 10 mg by mouth daily.   Yes Historical Provider, MD  metFORMIN (GLUCOPHAGE) 1000 MG tablet Take 1,000 mg by mouth 2 (two) times daily with a meal.   Yes Historical Provider, MD  metoprolol (LOPRESSOR) 100 MG tablet Take 100 mg by mouth daily. 10/07/12  Yes Historical Provider, MD    Physical Exam: Filed Vitals:   11/14/12 1845 11/14/12 1900 11/14/12 1915 11/14/12 1930  BP: 136/67 138/81 131/77 137/69  Pulse: 89 91 93 91  Temp:      TempSrc:      Resp: 13 12 19 12   SpO2: 98% 98% 98% 100%    General: Alert, Awake and Oriented to Time, Place and Person. Appear in moderate distress Eyes: PERRL ENT: Oral Mucosa clear dry. Neck: No JVD, no  Carotid Bruits  Cardiovascular: S1 and S2 Present, no Murmur, Peripheral Pulses Present Respiratory: Bilateral Air entry equal and Decreased, right-sided Crackles, no wheezes Abdomen: Bowel Sound Present, Soft and Non tender Skin: No Rash Extremities: No Pedal edema, no calf tenderness Neurologic: Grossly Unremarkable.  Labs on Admission:  CBC:  Recent Labs Lab 11/14/12 1805 11/14/12 1816  WBC 19.6*  --   NEUTROABS 17.6*  --   HGB 13.0 13.6  HCT 37.2* 40.0  MCV 85.3  --   PLT 165  --     CMP     Component Value Date/Time   NA 133* 11/14/2012 1816   K 4.1 11/14/2012 1816    CL 100 11/14/2012 1816   CO2 26 09/23/2010 1235   GLUCOSE 267* 11/14/2012 1816   BUN 29* 11/14/2012 1816   CREATININE 1.30 11/14/2012 1816   CALCIUM 9.8 09/23/2010 1235   PROT 7.4 09/23/2010 1235   ALBUMIN 4.3 09/23/2010 1235   AST 21 09/23/2010 1235   ALT 24 09/23/2010 1235   ALKPHOS 62 09/23/2010 1235   BILITOT 0.2* 09/23/2010 1235   GFRNONAA >60 09/23/2010 1235   GFRAA >60 09/23/2010 1235    No results found for this basename: LIPASE, AMYLASE,  in the last 168 hours No results found for this basename: AMMONIA,  in the last 168 hours  Cardiac Enzymes: No results found for this basename: CKTOTAL, CKMB, CKMBINDEX, TROPONINI,  in the last 168 hours  BNP (last 3 results) No results found for this basename: PROBNP,  in the last 8760 hours  Radiological Exams on Admission: No results found.  Assessment/Plan Principal Problem:   RLL pneumonia Active Problems:   Hypertension   Diabetes   CAD (coronary artery disease)   1. RLL pneumonia The patient had an outside chest x-ray which does show right-sided consolidation involving lower lobe primarily. Although the patient denies any complaint of aspiration. He does have history of diabetes. He denies any recent hospitalization or exposure to health care facility. Based on this I would treat him at present for community acquired pneumonia with suspicion for aspiration pneumonia as well. Currently I would cover him broadly with IV Zosyn and then antibiotic can be narrow down based on the culture. I will get urine antigens Legionella and Streptococcus, blood cultures, influenza antigens for further workup. I would hydrate him with IV fluids as well. If the patient does not respond to IV antibiotics that he may require further imaging to look for other causes. For the pain control I will put Lidoderm patch as well as when necessary narcotics.  2. Coronary artery disease Continue aspirin  3. Diabetes mellitus Continue glipizide and metformin Start him on  sliding scale  4. Hypertension Continue home dose of antihypertensive, he is on metoprolol 100 mg daily which is changed to 50 mg twice a day with holding parameters.  DVT Prophylaxis: subcutaneous Heparin Nutrition: Cardiac and diabetic diet  Code Status: Full  Family Communication: Family was present at bedside, opportunity was given to the family to ask question and all questions were answered satisfactorily at the time of interview. Disposition: Admitted to inpatient in med-surge.  Author: Lynden Oxford, MD [facility] Pager: [phone] 11/14/2012, 7:53 PM    If 7PM-7AM, please contact night-coverage [email or web address] Password [email or web address]

## 2012-11-14 NOTE — ED Notes (Addendum)
Pt reports to the ED from [facility] via [facility] for eval of possible right sided pneumonia. Pt reports that he has had a dry non-productive cough, general malaise, and body aches x 3 days. CBG 260 mg/dl. Pt reports decreased PO intake. Denies any active vomiting but reports some nausea. Reports right sided CP and SOB. 12 lead from EMS showed peaked T-waves. Per [facility] pt febrile and with an elevated WBC count. Pt A&O x4. Pt received 0.5 mg of Atrovent and was placed on 4 L of O2 via nasal cannula. Pt does not wear O2 at home.

## 2012-11-14 NOTE — Progress Notes (Signed)
ANTIBIOTIC CONSULT NOTE - INITIAL  Pharmacy Consult for Zosyn Indication: rule out pneumonia (CAP vs aspiration)  No Known Allergies  Patient Measurements:   Adjusted Body Weight:   Vital Signs: Temp: 98.9 F (37.2 C) (09/23 1844) Temp src: Oral (09/23 1844) BP: 126/69 mmHg (09/23 2000) Pulse Rate: 86 (09/23 2000) Intake/Output from previous day:   Intake/Output from this shift:    Labs:  Recent Labs  11/14/12 1805 11/14/12 1816  WBC 19.6*  --   HGB 13.0 13.6  PLT 165  --   CREATININE  --  1.30   CrCl is unknown because there is no height on file for the current visit. No results found for this basename: VANCOTROUGH, VANCOPEAK, VANCORANDOM, GENTTROUGH, GENTPEAK, GENTRANDOM, TOBRATROUGH, TOBRAPEAK, TOBRARND, AMIKACINPEAK, AMIKACINTROU, AMIKACIN,  in the last 72 hours   Microbiology: No results found for this or any previous visit (from the past 720 hour(s)).  Medical History: Past Medical History  Diagnosis Date  . Diabetes mellitus   . Hypertension   . MI (myocardial infarction)     Medications:  Scheduled:  . [START ON 11/15/2012] aspirin  325 mg Oral Daily  . enoxaparin (LOVENOX) injection  40 mg Subcutaneous Q24H  . [START ON 11/15/2012] glipiZIDE  10 mg Oral BID AC  . insulin aspart  0-5 Units Subcutaneous QHS  . [START ON 11/15/2012] insulin aspart  0-9 Units Subcutaneous TID WC  . lisinopril  10 mg Oral Daily  . [START ON 11/15/2012] metFORMIN  1,000 mg Oral BID WC  . metoprolol  50 mg Oral BID  . piperacillin-tazobactam (ZOSYN)  IV  3.375 g Intravenous Q8H   Assessment: 67 yr old male transferred from [facility] for treatment of suspected pneumonia. He also has DM, CAD, HTN. He received a dose of Zithromax 500 mg and Rocephin 1 Gm in the ED. He is complaining of fever, cough and right sided chest pain. He has smoked 1 pack a day for 44 years. Now to start on Zosyn per pharmacy.   Goal of Therapy:  Resolution of infection  Plan:  Will start with Zosyn  3.375 Gm IV q8h.  Will follow up when crcl is known.   Richard [name] 11/14/2012,9:32 PM

## 2012-11-15 ENCOUNTER — Inpatient Hospital Stay (HOSPITAL_COMMUNITY): Payer: Medicare Other

## 2012-11-15 LAB — COMPREHENSIVE METABOLIC PANEL
ALT: 24 U/L (ref 0–53)
Alkaline Phosphatase: 90 U/L (ref 39–117)
CO2: 29 mEq/L (ref 19–32)
Chloride: 96 mEq/L (ref 96–112)
GFR calc Af Amer: 60 mL/min — ABNORMAL LOW (ref >90)
GFR calc non Af Amer: 52 mL/min — ABNORMAL LOW (ref >90)
Glucose, Bld: 188 mg/dL — ABNORMAL HIGH (ref 70–99)
Potassium: 4.4 mEq/L (ref 3.5–5.1)
Sodium: 132 mEq/L — ABNORMAL LOW (ref 135–145)

## 2012-11-15 LAB — STREP PNEUMONIAE URINARY ANTIGEN: Strep Pneumo Urinary Antigen: NEGATIVE

## 2012-11-15 LAB — CBC WITH DIFFERENTIAL/PLATELET
Basophils Absolute: 0 10*3/uL (ref 0.0–0.1)
Basophils Relative: 0 % (ref 0–1)
Eosinophils Absolute: 0.1 10*3/uL (ref 0.0–0.7)
MCH: 29.8 pg (ref 26.0–34.0)
MCHC: 34.6 g/dL (ref 30.0–36.0)
Monocytes Absolute: 1.1 10*3/uL — ABNORMAL HIGH (ref 0.1–1.0)
Neutro Abs: 12 10*3/uL — ABNORMAL HIGH (ref 1.7–7.7)
Neutrophils Relative %: 81 % — ABNORMAL HIGH (ref 43–77)
RDW: 14.3 % (ref 11.5–15.5)

## 2012-11-15 LAB — GLUCOSE, CAPILLARY
Glucose-Capillary: 206 mg/dL — ABNORMAL HIGH (ref 70–99)
Glucose-Capillary: 129 mg/dL — ABNORMAL HIGH (ref 70–99)

## 2012-11-15 LAB — PROTIME-INR
INR: 1.09 (ref 0.00–1.49)
Prothrombin Time: 13.9 seconds (ref 11.6–15.2)

## 2012-11-15 LAB — LEGIONELLA ANTIGEN, URINE

## 2012-11-15 MED ORDER — DEXTROSE 5 % IV SOLN
1.0000 g | INTRAVENOUS | Status: DC
  Administered 2012-11-15 – 2012-11-16 (×2): 1 g via INTRAVENOUS
  Filled 2012-11-15 (×3): qty 10

## 2012-11-15 MED ORDER — HYDROCODONE-ACETAMINOPHEN 5-325 MG PO TABS
1.0000 | ORAL_TABLET | Freq: Four times a day (QID) | ORAL | Status: DC | PRN
  Administered 2012-11-15 – 2012-11-16 (×3): 1 via ORAL
  Filled 2012-11-15 (×3): qty 1

## 2012-11-15 MED ORDER — AZITHROMYCIN 500 MG PO TABS
500.0000 mg | ORAL_TABLET | Freq: Every day | ORAL | Status: DC
  Administered 2012-11-15 – 2012-11-16 (×2): 500 mg via ORAL
  Filled 2012-11-15 (×5): qty 1

## 2012-11-15 MED ORDER — DEXTROSE 5 % IV SOLN: 500.0000 mg | INTRAVENOUS | Status: DC

## 2012-11-15 MED ORDER — ALBUTEROL SULFATE (5 MG/ML) 0.5% IN NEBU: 2.5000 mg | INHALATION_SOLUTION | RESPIRATORY_TRACT | Status: DC | PRN

## 2012-11-15 MED ORDER — GUAIFENESIN ER 600 MG PO TB12
600.0000 mg | ORAL_TABLET | Freq: Two times a day (BID) | ORAL | Status: DC
  Administered 2012-11-15 – 2012-11-17 (×5): 600 mg via ORAL
  Filled 2012-11-15 (×7): qty 1

## 2012-11-15 MED ORDER — LIDOCAINE VISCOUS 2 % MT SOLN
15.0000 mL | OROMUCOSAL | Status: DC | PRN
  Administered 2012-11-15: 15 mL via OROMUCOSAL
  Filled 2012-11-15 (×2): qty 15

## 2012-11-15 MED ORDER — IBUPROFEN 600 MG PO TABS
600.0000 mg | ORAL_TABLET | Freq: Once | ORAL | Status: AC
  Administered 2012-11-15: 01:00:00 600 mg via ORAL
  Filled 2012-11-15 (×2): qty 1

## 2012-11-15 NOTE — Progress Notes (Signed)
Patient c/o painful blisters on inner lower lip. Small red/white spots noted. MD notified. Orders received.  Kathlene November, Lutricia Widjaja Hemingway

## 2012-11-15 NOTE — Progress Notes (Signed)
[facility] PROGRESS NOTE  Richard [name] ZOX:[id number] DOB: [birth year] DOA: 11/14/2012 PCP: Josue Hector, MD  Assessment/Plan:  1. Community acquired pneumonia -stop Zosyn, add ceftriaxone and azithromycin -add mucnex and albuterol nebs -check CXR here, unable to access Xray from [facility] -will need Fu CXR in 6 weeks  2. Tobacco use disorder -smoking cessation counseled  3. DM -hold metformin, SSI  4. H/o CAD -stable, continue ASA/BB/Statin  DVt proph: lovenox  Code Status: Full Family Communication: none at bedside Disposition Plan: home when improved   HPI/Subjective: Feels very weak, dry cough noted  Objective: Filed Vitals:   11/15/12 1040  BP: 120/63  Pulse: 81  Temp:   Resp:     Intake/Output Summary (Last 24 hours) at 11/15/12 1410 Last data filed at 11/15/12 1044  Gross per 24 hour  Intake 1473.33 ml  Output    500 ml  Net 973.33 ml   Filed Weights   11/14/12 2157  Weight: 88.905 kg (196 lb)    Exam:   General:  AAOx3  Cardiovascular: S1S2/RRR  Respiratory: ronchi at R base  Abdomen: soft, NT, BS present  Musculoskeletal: no edema c/c  Data Reviewed: Basic Metabolic Panel:  Recent Labs Lab 11/14/12 1816 11/15/12 0621  NA 133* 132*  K 4.1 4.4  CL 100 96  CO2  --  29  GLUCOSE 267* 188*  BUN 29* 31*  CREATININE 1.30 1.37*  CALCIUM  --  8.9   Liver Function Tests:  Recent Labs Lab 11/15/12 0621  AST 15  ALT 24  ALKPHOS 90  BILITOT 0.2*  PROT 6.9  ALBUMIN 2.8*   No results found for this basename: LIPASE, AMYLASE,  in the last 168 hours No results found for this basename: AMMONIA,  in the last 168 hours CBC:  Recent Labs Lab 11/14/12 1805 11/14/12 1816 11/15/12 0621  WBC 19.6*  --  14.8*  NEUTROABS 17.6*  --  12.0*  HGB 13.0 13.6 12.3*  HCT 37.2* 40.0 35.5*  MCV 85.3  --  86.0  PLT 165  --  152   Cardiac Enzymes: No results found for this basename: CKTOTAL, CKMB, CKMBINDEX, TROPONINI,  in  the last 168 hours BNP (last 3 results) No results found for this basename: PROBNP,  in the last 8760 hours CBG:  Recent Labs Lab 11/14/12 2158 11/15/12 0831 11/15/12 1208  GLUCAP 266* 193* 206*    No results found for this or any previous visit (from the past 240 hour(s)).   Studies: No results found.  Scheduled Meds: . aspirin  325 mg Oral Daily  . azithromycin  500 mg Oral q1800  . cefTRIAXone (ROCEPHIN)  IV  1 g Intravenous Q24H  . enoxaparin (LOVENOX) injection  40 mg Subcutaneous Q24H  . glipiZIDE  10 mg Oral BID AC  . guaiFENesin  600 mg Oral BID  . insulin aspart  0-5 Units Subcutaneous QHS  . insulin aspart  0-9 Units Subcutaneous TID WC  . lisinopril  10 mg Oral Daily  . metFORMIN  1,000 mg Oral BID WC  . metoprolol  50 mg Oral BID   Continuous Infusions: . sodium chloride 75 mL/hr at 11/15/12 1044    Principal Problem:   RLL pneumonia Active Problems:   Hypertension   Diabetes   CAD (coronary artery disease)    Time spent:    [facility]  [facility] Pager [phone]. If 7PM-7AM, please contact night-coverage at [email or web address], password [email or web address] 11/15/2012, 2:10 PM  LOS: 1 day

## 2012-11-16 ENCOUNTER — Inpatient Hospital Stay (HOSPITAL_COMMUNITY): Payer: Medicare Other

## 2012-11-16 LAB — GLUCOSE, CAPILLARY
Glucose-Capillary: 213 mg/dL — ABNORMAL HIGH (ref 70–99)
Glucose-Capillary: 208 mg/dL — ABNORMAL HIGH (ref 70–99)
Glucose-Capillary: 126 mg/dL — ABNORMAL HIGH (ref 70–99)

## 2012-11-16 LAB — BASIC METABOLIC PANEL
BUN: 22 mg/dL (ref 6–23)
CO2: 24 mEq/L (ref 19–32)
Calcium: 8.8 mg/dL (ref 8.4–10.5)
Chloride: 102 mEq/L (ref 96–112)
GFR calc Af Amer: >90 mL/min (ref >90)
GFR calc non Af Amer: 89 mL/min — ABNORMAL LOW (ref >90)
Glucose, Bld: 182 mg/dL — ABNORMAL HIGH (ref 70–99)
Potassium: 3.8 mEq/L (ref 3.5–5.1)
Sodium: 139 mEq/L (ref 135–145)

## 2012-11-16 LAB — CBC
HCT: 36.5 % — ABNORMAL LOW (ref 39.0–52.0)
Hemoglobin: 12.5 g/dL — ABNORMAL LOW (ref 13.0–17.0)
MCH: 29.3 pg (ref 26.0–34.0)
MCHC: 34.2 g/dL (ref 30.0–36.0)
Platelets: 218 10*3/uL (ref 150–400)
RBC: 4.26 MIL/uL (ref 4.22–5.81)

## 2012-11-16 MED ORDER — POLYETHYLENE GLYCOL 3350 17 G PO PACK
17.0000 g | PACK | Freq: Two times a day (BID) | ORAL | Status: DC
  Filled 2012-11-16: qty 1

## 2012-11-16 MED ORDER — SENNOSIDES-DOCUSATE SODIUM 8.6-50 MG PO TABS: 1.0000 | ORAL_TABLET | Freq: Two times a day (BID) | ORAL | Status: DC

## 2012-11-16 MED ORDER — MAGIC MOUTHWASH W/LIDOCAINE
5.0000 mL | Freq: Four times a day (QID) | ORAL | Status: DC | PRN
  Administered 2012-11-16 – 2012-11-17 (×4): 5 mL via ORAL
  Filled 2012-11-16 (×4): qty 5

## 2012-11-16 NOTE — Progress Notes (Signed)
Results for abdominal xray are posted. Instructed to call MD once results are in. Paged Craige Cotta NP. Awaiting call back. Passed on report to Ryanne, Charity fundraiser. Gilman Schmidt

## 2012-11-16 NOTE — Progress Notes (Signed)
[facility] PROGRESS NOTE  Richard [name] WUJ:[id number] DOB: [birth year] DOA: 11/14/2012 PCP: Josue Hector, MD  Assessment/Plan:  1. Community acquired pneumonia -Continue ceftriaxone and azithromycin Day 2 -mucnex and albuterol nebs -repeat CXR with R middle lobe consolidation -will need Fu CXR in 6 weeks  2. Tobacco use disorder -smoking cessation counseled  3. DM -hold metformin, SSI  4. H/o CAD -stable, continue ASA/BB/Statin  DVt proph: lovenox  Code Status: Full Family Communication: none at bedside Disposition Plan: home tomorrow if stable   HPI/Subjective: Feeling better, cough, SoB improving  Objective: Filed Vitals:   11/16/12 0606  BP: 122/69  Pulse: 74  Temp: 98.7 F (37.1 C)  Resp: 18    Intake/Output Summary (Last 24 hours) at 11/16/12 1304 Last data filed at 11/16/12 1029  Gross per 24 hour  Intake   2095 ml  Output      0 ml  Net   2095 ml   Filed Weights   11/14/12 2157  Weight: 88.905 kg (196 lb)    Exam:   General:  AAOx3  Cardiovascular: S1S2/RRR  Respiratory: ronchi at R base  Abdomen: soft, NT, BS present  Musculoskeletal: no edema c/c  Data Reviewed: Basic Metabolic Panel:  Recent Labs Lab 11/14/12 1816 11/15/12 0621 11/16/12 0430  NA 133* 132* 139  K 4.1 4.4 3.8  CL 100 96 102  CO2  --  29 24  GLUCOSE 267* 188* 182*  BUN 29* 31* 22  CREATININE 1.30 1.37* 0.82  CALCIUM  --  8.9 8.8   Liver Function Tests:  Recent Labs Lab 11/15/12 0621  AST 15  ALT 24  ALKPHOS 90  BILITOT 0.2*  PROT 6.9  ALBUMIN 2.8*   No results found for this basename: LIPASE, AMYLASE,  in the last 168 hours No results found for this basename: AMMONIA,  in the last 168 hours CBC:  Recent Labs Lab 11/14/12 1805 11/14/12 1816 11/15/12 0621 11/16/12 0430  WBC 19.6*  --  14.8* 8.8  NEUTROABS 17.6*  --  12.0*  --   HGB 13.0 13.6 12.3* 12.5*  HCT 37.2* 40.0 35.5* 36.5*  MCV 85.3  --  86.0 85.7  PLT 165  --   152 218   Cardiac Enzymes: No results found for this basename: CKTOTAL, CKMB, CKMBINDEX, TROPONINI,  in the last 168 hours BNP (last 3 results) No results found for this basename: PROBNP,  in the last 8760 hours CBG:  Recent Labs Lab 11/15/12 1208 11/15/12 1702 11/15/12 2142 11/16/12 0753 11/16/12 1222  GLUCAP 206* 129* 232* 213* 183*    Recent Results (from the past 240 hour(s))  CULTURE, BLOOD (ROUTINE X 2)     Status: None   Collection Time    11/14/12 10:58 PM      Result Value Range Status   Specimen Description BLOOD RIGHT ARM   Final   Special Requests BOTTLES DRAWN AEROBIC AND ANAEROBIC 10CC   Final   Culture  Setup Time     Final   Value: 11/15/2012 04:37     Performed at [facility]   Culture     Final   Value:        BLOOD CULTURE RECEIVED NO GROWTH TO DATE CULTURE WILL BE HELD FOR 5 DAYS BEFORE ISSUING A FINAL NEGATIVE REPORT     Performed at [facility]   Report Status PENDING   Incomplete  CULTURE, BLOOD (ROUTINE X 2)     Status: None  Collection Time    11/14/12 11:07 PM      Result Value Range Status   Specimen Description BLOOD RIGHT FOREARM   Final   Special Requests BOTTLES DRAWN AEROBIC ONLY 8CC   Final   Culture  Setup Time     Final   Value: 11/15/2012 04:36     Performed at [facility]   Culture     Final   Value:        BLOOD CULTURE RECEIVED NO GROWTH TO DATE CULTURE WILL BE HELD FOR 5 DAYS BEFORE ISSUING A FINAL NEGATIVE REPORT     Performed at [facility]   Report Status PENDING   Incomplete     Studies: Dg Chest 2 View  11/15/2012   CLINICAL DATA:  67 year old male chest pain shortness of breath cough and fever.  EXAM: CHEST  2 VIEW  COMPARISON:  11/14/2012 and earlier.  FINDINGS: Continued right middle lobe consolidation. Stable lung volumes in ventilation. Cardiac and mediastinal contours stable and within normal limits. Cervical ACDF hardware. No acute osseous abnormality identified.  IMPRESSION:  Unchanged right middle lobe consolidation / pneumonia.  Post treatment radiographs recommended to document resolution.   Electronically Signed   By: Augusto Gamble M.D.   On: 11/15/2012 17:38    Scheduled Meds: . aspirin  325 mg Oral Daily  . azithromycin  500 mg Oral q1800  . cefTRIAXone (ROCEPHIN)  IV  1 g Intravenous Q24H  . enoxaparin (LOVENOX) injection  40 mg Subcutaneous Q24H  . glipiZIDE  10 mg Oral BID AC  . guaiFENesin  600 mg Oral BID  . insulin aspart  0-5 Units Subcutaneous QHS  . insulin aspart  0-9 Units Subcutaneous TID WC  . lisinopril  10 mg Oral Daily  . metFORMIN  1,000 mg Oral BID WC  . metoprolol  50 mg Oral BID   Continuous Infusions: . sodium chloride 75 mL/hr at 11/16/12 7253    Principal Problem:   RLL pneumonia Active Problems:   Hypertension   Diabetes   CAD (coronary artery disease)    Time spent:    [facility]  [facility] Pager [phone]. If 7PM-7AM, please contact night-coverage at [email or web address], password [facility] 11/16/2012, 1:04 PM  LOS: 2 days

## 2012-11-16 NOTE — Progress Notes (Signed)
Pt ambulated around the "circle" two times prior to supper. Pt ambulated with standby assist. Pt tolerated very well.  Kathlene November, Millenia Waldvogel [place]

## 2012-11-16 NOTE — Progress Notes (Signed)
CBGs 9/24:  [phone] mg/dl  8/29:  562 mg/dl Recommend increasing Novolog correction scale to MODERATE TID & HS if CBGs continue greater than 180 mg/dl.  Thanks, Smith Mince RN BSN CDE

## 2012-11-16 NOTE — Progress Notes (Signed)
Abd xray negative.

## 2012-11-16 NOTE — Progress Notes (Signed)
Utilization review completed. Jaythan Hinely, RN, BSN. 

## 2012-11-17 LAB — GLUCOSE, CAPILLARY
Glucose-Capillary: 162 mg/dL — ABNORMAL HIGH (ref 70–99)
Glucose-Capillary: 215 mg/dL — ABNORMAL HIGH (ref 70–99)

## 2012-11-17 MED ORDER — MAGIC MOUTHWASH W/LIDOCAINE: 2.0000 mL | Freq: Three times a day (TID) | ORAL | Status: DC | PRN

## 2012-11-17 MED ORDER — SACCHAROMYCES BOULARDII 250 MG PO CAPS: 250.0000 mg | ORAL_CAPSULE | Freq: Two times a day (BID) | ORAL | Status: DC

## 2012-11-17 MED ORDER — GUAIFENESIN ER 600 MG PO TB12: 600.0000 mg | ORAL_TABLET | Freq: Two times a day (BID) | ORAL | Status: DC

## 2012-11-17 MED ORDER — PANTOPRAZOLE SODIUM 40 MG PO TBEC
40.0000 mg | DELAYED_RELEASE_TABLET | Freq: Every day | ORAL | Status: DC
  Administered 2012-11-17: 40 mg via ORAL
  Filled 2012-11-17: qty 1

## 2012-11-17 MED ORDER — SACCHAROMYCES BOULARDII 250 MG PO CAPS
250.0000 mg | ORAL_CAPSULE | Freq: Two times a day (BID) | ORAL | Status: DC
  Administered 2012-11-17: 250 mg via ORAL
  Filled 2012-11-17 (×3): qty 1

## 2012-11-17 MED ORDER — LEVOFLOXACIN 500 MG PO TABS: 500.0000 mg | ORAL_TABLET | Freq: Every day | ORAL | Status: DC

## 2012-11-17 NOTE — Progress Notes (Signed)
Discharge home.  Patient educated on discharge instructions, follow up appointments, and discharge medications.  Prescriptions sent electronically to [facility].  Patient wife at bedside, reviewed labs and test results with patient and wife.  IV removed.  Belongings gathered.  Patient escorted down to main entrance with NT.

## 2012-11-20 NOTE — Discharge Summary (Signed)
Physician Discharge Summary  CARTRELL BENTSEN ZOX:[id number] DOB: 1945/08/21 DOA: 11/14/2012  PCP: Josue Hector, MD  Admit date: 11/14/2012 Discharge date: 11/17/2012  Time spent: 50 minutes  Recommendations for Outpatient Follow-up:  1. PCP in 1 week  Discharge Diagnoses:  Principal Problem:   RLL pneumonia Active Problems:   Hypertension   Diabetes   CAD (coronary artery disease)   Tobacco use  Discharge Condition: stable  Diet recommendation: heart healthy carb modified diet  Filed Weights   11/14/12 2157  Weight: 88.905 kg (196 lb)    History of present illness:  Richard [name] is a 67 y.o. male with Past medical history of diabetes mellitus, hypertension, coronary artery disease status post PCI.  He presented today with the complaint of cough and feeling of generalized tired and lethargy that has been ongoing since last 3 days. He also has started having fever since Sunday. Since this morning he started having pain on the right side of the chest which is associated with deep breathing and cough. The pain is sharp and stabbing in nature also gets worse with movement.  He denies any recent immobilization or surgery or long travel or leg swelling or calf tenderness.  He also denies any aspiration on his food.  He denies any prior history of pneumonia or sick contacts.  He did complain of some headache before but no neck pain or neck stiffness or photophobia.  He denies any abdominal symptoms including nausea vomiting abdominal pain diarrhea.  He occasionally complains of BPH symptoms which appear chronic for him.  He is an active smoker and smokes one pack a day has been smoking since last 44 years. Denies any weight loss, lumps or bumps anywhere, chronic cough.    Hospital Course:  1. Community acquired pneumonia  -Treated with IV ceftriaxone and azithromycin initially -then transitioned to Po levaquin  -mucnex and albuterol nebs  -repeat CXR with R middle lobe  consolidation  -will need Fu CXR in 6 weeks   2. Tobacco use disorder  -smoking cessation counseled   3. DM  -hold metformin, SSI   4. H/o CAD  -stable, continue ASA/BB/Statin        Discharge Exam: Filed Vitals:   11/17/12 1639  BP: 164/73  Pulse: 84  Temp: 97.8 F (36.6 C)  Resp: 20    General: AAOx3 Cardiovascular: S1S2/RRR Respiratory: CTAB  Discharge Instructions     Medication List         acetaminophen 325 MG tablet  Commonly known as:  TYLENOL  Take 325 mg by mouth every 6 (six) hours as needed for fever.     aspirin 325 MG tablet  Take 325 mg by mouth daily.     glipiZIDE 10 MG tablet  Commonly known as:  GLUCOTROL  Take 10 mg by mouth 2 (two) times daily before a meal.     guaiFENesin 600 MG 12 hr tablet  Commonly known as:  MUCINEX  Take 1 tablet (600 mg total) by mouth 2 (two) times daily. For 6 days     levofloxacin 500 MG tablet  Commonly known as:  LEVAQUIN  Take 1 tablet (500 mg total) by mouth daily. For 5 days     lisinopril 10 MG tablet  Commonly known as:  PRINIVIL,ZESTRIL  Take 10 mg by mouth daily.     magic mouthwash w/lidocaine Soln  Take 2 mLs by mouth 3 (three) times daily as needed.     metFORMIN 1000 MG tablet  Commonly known as:  GLUCOPHAGE  Take 1,000 mg by mouth 2 (two) times daily with a meal.     metoprolol 100 MG tablet  Commonly known as:  LOPRESSOR  Take 100 mg by mouth daily.     saccharomyces boulardii 250 MG capsule  Commonly known as:  FLORASTOR  Take 1 capsule (250 mg total) by mouth 2 (two) times daily. For 1 week       No Known Allergies     Follow-up Information   Follow up with Josue Hector, MD. Schedule an appointment as soon as possible for a visit in 1 week.   Specialty:  Family Medicine   Contact information:   [address] [place] [address] [phone]       Follow up with Follow up Chest Xray In 6 weeks.       The results of significant diagnostics from this  hospitalization (including imaging, microbiology, ancillary and laboratory) are listed below for reference.    Significant Diagnostic Studies: Dg Chest 2 View  11/15/2012   CLINICAL DATA:  67 year old male chest pain shortness of breath cough and fever.  EXAM: CHEST  2 VIEW  COMPARISON:  11/14/2012 and earlier.  FINDINGS: Continued right middle lobe consolidation. Stable lung volumes in ventilation. Cardiac and mediastinal contours stable and within normal limits. Cervical ACDF hardware. No acute osseous abnormality identified.  IMPRESSION: Unchanged right middle lobe consolidation / pneumonia.  Post treatment radiographs recommended to document resolution.   Electronically Signed   By: Augusto Gamble M.D.   On: 11/15/2012 17:38   Dg Abd 1 View  11/16/2012   CLINICAL DATA:  Abdominal pain and distention with nausea.  EXAM: ABDOMEN - 1 VIEW  COMPARISON:  None.  FINDINGS: The bowel gas pattern is normal. No radio-opaque calculi or other significant radiographic abnormality are seen.  IMPRESSION: Negative.   Electronically Signed   By: Drusilla Kanner M.D.   On: 11/16/2012 21:57    Microbiology: Recent Results (from the past 240 hour(s))  CULTURE, BLOOD (ROUTINE X 2)     Status: None   Collection Time    11/14/12 10:58 PM      Result Value Range Status   Specimen Description BLOOD RIGHT ARM   Final   Special Requests BOTTLES DRAWN AEROBIC AND ANAEROBIC 10CC   Final   Culture  Setup Time     Final   Value: 11/15/2012 04:37     Performed at [facility]   Culture     Final   Value:        BLOOD CULTURE RECEIVED NO GROWTH TO DATE CULTURE WILL BE HELD FOR 5 DAYS BEFORE ISSUING A FINAL NEGATIVE REPORT     Performed at [facility]   Report Status PENDING   Incomplete  CULTURE, BLOOD (ROUTINE X 2)     Status: None   Collection Time    11/14/12 11:07 PM      Result Value Range Status   Specimen Description BLOOD RIGHT FOREARM   Final   Special Requests BOTTLES DRAWN AEROBIC ONLY 8CC    Final   Culture  Setup Time     Final   Value: 11/15/2012 04:36     Performed at [facility]   Culture     Final   Value:        BLOOD CULTURE RECEIVED NO GROWTH TO DATE CULTURE WILL BE HELD FOR 5 DAYS BEFORE ISSUING A FINAL NEGATIVE REPORT  Performed at [facility]   Report Status PENDING   Incomplete     Labs: Basic Metabolic Panel:  Recent Labs Lab 11/14/12 1816 11/15/12 0621 11/16/12 0430  NA 133* 132* 139  K 4.1 4.4 3.8  CL 100 96 102  CO2  --  29 24  GLUCOSE 267* 188* 182*  BUN 29* 31* 22  CREATININE 1.30 1.37* 0.82  CALCIUM  --  8.9 8.8   Liver Function Tests:  Recent Labs Lab 11/15/12 0621  AST 15  ALT 24  ALKPHOS 90  BILITOT 0.2*  PROT 6.9  ALBUMIN 2.8*   No results found for this basename: LIPASE, AMYLASE,  in the last 168 hours No results found for this basename: AMMONIA,  in the last 168 hours CBC:  Recent Labs Lab 11/14/12 1805 11/14/12 1816 11/15/12 0621 11/16/12 0430  WBC 19.6*  --  14.8* 8.8  NEUTROABS 17.6*  --  12.0*  --   HGB 13.0 13.6 12.3* 12.5*  HCT 37.2* 40.0 35.5* 36.5*  MCV 85.3  --  86.0 85.7  PLT 165  --  152 218   Cardiac Enzymes: No results found for this basename: CKTOTAL, CKMB, CKMBINDEX, TROPONINI,  in the last 168 hours BNP: BNP (last 3 results) No results found for this basename: PROBNP,  in the last 8760 hours CBG:  Recent Labs Lab 11/16/12 1635 11/16/12 2126 11/17/12 0820 11/17/12 1116 11/17/12 1638  GLUCAP 208* 126* 174* 162* 215*       Signed:  Nakoma Gotwalt  [facility] 11/20/2012, 3:48 PM

## 2012-11-21 LAB — CULTURE, BLOOD (ROUTINE X 2): Culture: NO GROWTH

## 2013-02-08 ENCOUNTER — Encounter: Payer: Self-pay | Admitting: Family Medicine

## 2013-03-06 ENCOUNTER — Encounter: Payer: Self-pay | Admitting: Family Medicine

## 2013-10-22 ENCOUNTER — Ambulatory Visit (HOSPITAL_COMMUNITY)
Admission: RE | Admit: 2013-10-22 | Discharge: 2013-10-22 | Disposition: A | Discharge status: Home / Self Care | Payer: Medicare Other | Source: Ambulatory Visit | Attending: Adult Health Nurse Practitioner | Admitting: Adult Health Nurse Practitioner

## 2013-10-22 ENCOUNTER — Other Ambulatory Visit (HOSPITAL_COMMUNITY): Payer: Self-pay | Admitting: Adult Health Nurse Practitioner

## 2013-10-22 DIAGNOSIS — J069 Acute upper respiratory infection, unspecified: Secondary | ICD-10-CM

## 2014-08-19 ENCOUNTER — Encounter: Payer: Self-pay | Admitting: Physician Assistant

## 2014-08-19 ENCOUNTER — Ambulatory Visit (INDEPENDENT_AMBULATORY_CARE_PROVIDER_SITE_OTHER): Payer: Medicare Other | Admitting: Physician Assistant

## 2014-08-19 VITALS — BP 162/78 | HR 76 | Ht 70.0 in | Wt 194.4 lb

## 2014-08-19 DIAGNOSIS — Z1211 Encounter for screening for malignant neoplasm of colon: Secondary | ICD-10-CM | POA: Diagnosis not present

## 2014-08-19 NOTE — Progress Notes (Addendum)
Patient ID: Richard [name], male   DOB: [birth year], 69 y.o.   MRN: [id number]   Subjective:    Patient ID: Richard [name], male    DOB: [birth year], 69 y.o.   MRN: [id number]  HPI Richard [name] is a very nice 69 year old white male referred by Dr. Edrick Oh for screening colonoscopy. Patient is new to our practice and has not had any prior colon evaluation. He states that he did have a remote endoscopy in the 1980s for reflux-type symptoms and was told he had a hiatal hernia. Patient has no current GI complaints he says he does have problems with mild constipation but no recent changes, no abdominal pain ,changes in bowel habits ,melena or hematochezia. He believes that he has an external hemorrhoid. Family history is negative for colon cancer and polyps Patient has history of adult-onset diabetes mellitus, coronary artery disease status post stent 2 in 2006 and then an ablation for atrial flutter in 2007. He is not on any anticoagulation.  Review of Systems Pertinent positive and negative review of systems were noted in the above HPI section.  All other review of systems was otherwise negative.  Outpatient Encounter Prescriptions as of 08/19/2014  Medication Sig  . acetaminophen (TYLENOL) 325 MG tablet Take 325 mg by mouth every 6 (six) hours as needed for fever.  [name] aspirin 325 MG tablet Take 325 mg by mouth daily.  [name] glipiZIDE (GLUCOTROL) 10 MG tablet Take 10 mg by mouth 2 (two) times daily before a meal.  . lisinopril (PRINIVIL,ZESTRIL) 10 MG tablet Take 10 mg by mouth daily.  . metFORMIN (GLUCOPHAGE) 1000 MG tablet Take 1,000 mg by mouth 2 (two) times daily with a meal.  . metoprolol (LOPRESSOR) 100 MG tablet Take 100 mg by mouth daily.  . [DISCONTINUED] Alum & Mag Hydroxide-Simeth (MAGIC MOUTHWASH W/LIDOCAINE) SOLN Take 2 mLs by mouth 3 (three) times daily as needed.  . [DISCONTINUED] guaiFENesin (MUCINEX) 600 MG 12 hr tablet Take 1 tablet (600 mg total) by mouth 2 (two) times daily. For 6 days  .  [DISCONTINUED] levofloxacin (LEVAQUIN) 500 MG tablet Take 1 tablet (500 mg total) by mouth daily. For 5 days  . [DISCONTINUED] saccharomyces boulardii (FLORASTOR) 250 MG capsule Take 1 capsule (250 mg total) by mouth 2 (two) times daily. For 1 week   No facility-administered encounter medications on file as of 08/19/2014.   No Known Allergies Patient Active Problem List   Diagnosis Date Noted  . Hypertension 11/14/2012  . RLL pneumonia 11/14/2012  . Diabetes 11/14/2012  . CAD (coronary artery disease) 11/14/2012  . HIATAL HERNIA 11/24/1983   History   Social History  . Marital Status: Married    Spouse Name: N/A  . Number of Children: 4  . Years of Education: N/A   Occupational History  . truck driver    Social History Main Topics  . Smoking status: Current Every Day Smoker -- 1.00 packs/day    Types: Cigarettes  . Smokeless tobacco: Never Used  . Alcohol Use: No  . Drug Use: No  . Sexual Activity: Not on file   Other Topics Concern  . Not on file   Social History Narrative    Richard [name]'s family history includes Diabetes in his mother; Emphysema in his father; Heart disease in his brother and mother; Lung cancer in his brother.      Objective:    Filed Vitals:   08/19/14 0913  BP: 162/78  Pulse: 76    Physical Exam  well-developed older white male in no acute distress, pleasant blood pressure 162/78 pulse 76 height 5 foot 10 weight 194. HEENT; nontraumatic normocephalic EOMI PERRLA sclera anicteric, Supple; no JVD, Cardiovascular ;regular rate and rhythm with S1-S2 no murmur rub or gallop, Pulmonary; clear bilaterally, Abdomen; soft nondistended nontender bowel sounds are active there is no palpable mass or hepatosplenomegaly, Rectal; exam not done, Ext;no clubbing cyanosis or edema skin warm and dry, Psych; mood and affect appropriate       Assessment & Plan:   #1 69 yo male referred for colon cancer screening. Asymptomatic ,average risk and no prior colon  evaluation. #2 coronary artery disease status post stent 2, 2006 #3 history of atrial flutter status post ablation 2007 #4.onset diabetes mellitus #5 hypertension  Plan; patient will be scheduled for colonoscopy with Dr.Pyrtle. Procedure was discussed in detail with patient and he is agreeable to proceed. We discussed management of mild constipation, including adding a fiber supplement like Benefiber and increasing water intake to 6-8 glasses per day. If that is ineffective he may try MiraLAX 17 g daily in 8 ounces of water.  Richard [name] S Richard Bubar PA-C 08/19/2014   Cc: Richard Housekeeper, MD  Addendum: Reviewed and agree with initial management. Richard Bears, MD

## 2014-08-19 NOTE — Patient Instructions (Addendum)
You have been given a separate informational sheet regarding your tobacco use, the importance of quitting and local resources to help you quit. You have been scheduled for a colonoscopy. Please follow written instructions given to you at your visit today.  Please pick up your prep supplies at the pharmacy within the next 1-3 days. [facility] [place] [place]. If you use inhalers (even only as needed), please bring them with you on the day of your procedure. Your physician has requested that you go to www.startemmi.com and enter the access code given to you at your visit today. This web site gives a general overview about your procedure. However, you should still follow specific instructions given to you by our office regarding your preparation for the procedure.

## 2014-08-27 ENCOUNTER — Encounter: Payer: Self-pay | Admitting: Internal Medicine

## 2014-08-27 ENCOUNTER — Ambulatory Visit (AMBULATORY_SURGERY_CENTER): Payer: Medicare Other | Admitting: Internal Medicine

## 2014-08-27 VITALS — BP 164/88 | HR 83 | Temp 97.2°F | Resp 20 | Ht 70.0 in | Wt 194.0 lb

## 2014-08-27 DIAGNOSIS — Z1211 Encounter for screening for malignant neoplasm of colon: Secondary | ICD-10-CM | POA: Diagnosis not present

## 2014-08-27 DIAGNOSIS — D122 Benign neoplasm of ascending colon: Secondary | ICD-10-CM

## 2014-08-27 DIAGNOSIS — D125 Benign neoplasm of sigmoid colon: Secondary | ICD-10-CM | POA: Diagnosis not present

## 2014-08-27 LAB — GLUCOSE, CAPILLARY
Glucose-Capillary: 277 mg/dL — ABNORMAL HIGH (ref 65–99)
Glucose-Capillary: 273 mg/dL — ABNORMAL HIGH (ref 65–99)

## 2014-08-27 MED ORDER — SODIUM CHLORIDE 0.9 % IV SOLN: 500.0000 mL | INTRAVENOUS | Status: DC

## 2014-08-27 NOTE — Op Note (Signed)
[place]  [address] [place], [id number]   COLONOSCOPY PROCEDURE REPORT  PATIENT: Richard [name], Richard [name]  MR#: [id number] BIRTHDATE: 10/07/1945 , 68  yrs. old GENDER: male ENDOSCOPIST: Jerene Bears, MD REFERRED YH:CWCBJSE Edrick Oh, M.D. PROCEDURE DATE:  08/27/2014 PROCEDURE:   Colonoscopy, screening and Colonoscopy with snare polypectomy First Screening Colonoscopy - Avg.  risk and is 50 yrs.  old or older Yes.  Prior Negative Screening - Now for repeat screening. N/A  History of Adenoma - Now for follow-up colonoscopy & has been > or = to 3 yrs.  N/A  Polyps removed today? Yes ASA CLASS:   Class III INDICATIONS:Screening for colonic neoplasia and Colorectal Neoplasm Risk Assessment for this procedure is average risk. MEDICATIONS: Monitored anesthesia care and Propofol 200 mg IV  DESCRIPTION OF PROCEDURE:   After the risks benefits and alternatives of the procedure were thoroughly explained, informed consent was obtained.  The digital rectal exam revealed several skin tags.   The [id number]  endoscope was introduced through the anus and advanced to the cecum, which was identified by both the appendix and ileocecal valve. No adverse events experienced.   The quality of the prep was excellent.  (Suprep was used)  The instrument was then slowly withdrawn as the colon was fully examined. Estimated blood loss is zero unless otherwise noted in this procedure report.  COLON FINDINGS: A sessile polyp measuring 3 mm in size was found in the ascending colon.  A polypectomy was performed with a cold snare.  The resection was complete, the polyp tissue was completely retrieved and sent to histology.   A semi-pedunculated polyp measuring 15 mm in size was found in the sigmoid colon.  A polypectomy was performed using snare cautery.  The resection was complete, the polyp tissue was completely retrieved and sent to histology.   There was moderate diverticulosis noted in  the descending colon and sigmoid colon.  Retroflexed views revealed internal hemorrhoids. The time to cecum = 2.4 Withdrawal time = 12.1   The scope was withdrawn and the procedure completed. COMPLICATIONS: There were no immediate complications.  ENDOSCOPIC IMPRESSION: 1.   Sessile polyp was found in the ascending colon; polypectomy was performed with a cold snare 2.   Semi-pedunculated polyp was found in the sigmoid colon; polypectomy was performed using snare cautery 3.   Moderate diverticulosis was noted in the descending colon and sigmoid colon  RECOMMENDATIONS: 1.  Avoid all NSAIDS for the next 2 weeks. 2.  High fiber diet 3.  If the polyps removed today are proven to be adenomatous (pre-cancerous) polyps, you will need a colonoscopy in 3 years. Otherwise you should continue to follow colorectal cancer screening guidelines for "routine risk" patients with a colonoscopy in 10 years.  You will receive a letter within 1-2 weeks with the results of your biopsy as well as final recommendations.  Please call my office if you have not received a letter after 3 weeks.  eSigned:  Jerene Bears, MD 08/27/2014 8:35 AM    cc: Dione Housekeeper, MD and The Patient

## 2014-08-27 NOTE — Progress Notes (Signed)
To recovery, report to Myers, RN, VSS. 

## 2014-08-27 NOTE — Progress Notes (Signed)
Called to room to assist during endoscopic procedure.  Patient ID and intended procedure confirmed with present staff. Received instructions for my participation in the procedure from the performing physician.  

## 2014-08-27 NOTE — Patient Instructions (Signed)
YOU HAD AN ENDOSCOPIC PROCEDURE TODAY AT [facility]:   Refer to the procedure report that was given to you for any specific questions about what was found during the examination.  If the procedure report does not answer your questions, please call your gastroenterologist to clarify.  If you requested that your care partner not be given the details of your procedure findings, then the procedure report has been included in a sealed envelope for you to review at your convenience later.  YOU SHOULD EXPECT: Some feelings of bloating in the abdomen. Passage of more gas than usual.  Walking can help get rid of the air that was put into your GI tract during the procedure and reduce the bloating. If you had a lower endoscopy (such as a colonoscopy or flexible sigmoidoscopy) you may notice spotting of blood in your stool or on the toilet paper. If you underwent a bowel prep for your procedure, you may not have a normal bowel movement for a few days.  Please Note:  You might notice some irritation and congestion in your nose or some drainage.  This is from the oxygen used during your procedure.  There is no need for concern and it should clear up in a day or so.  SYMPTOMS TO REPORT IMMEDIATELY:   Following lower endoscopy (colonoscopy or flexible sigmoidoscopy):  Excessive amounts of blood in the stool  Significant tenderness or worsening of abdominal pains  Swelling of the abdomen that is new, acute  Fever of 100F or higher  For urgent or emergent issues, a gastroenterologist can be reached at any hour by calling [phone].   DIET: Your first meal following the procedure should be a small meal and then it is ok to progress to your normal diet. Heavy or fried foods are harder to digest and may make you feel nauseous or bloated.  Likewise, meals heavy in dairy and vegetables can increase bloating.  Drink plenty of fluids but you should avoid alcoholic beverages for 24  hours.  ACTIVITY:  You should plan to take it easy for the rest of today and you should NOT DRIVE or use heavy machinery until tomorrow (because of the sedation medicines used during the test).    FOLLOW UP: Our staff will call the number listed on your records the next business day following your procedure to check on you and address any questions or concerns that you may have regarding the information given to you following your procedure. If we do not reach you, we will leave a message.  However, if you are feeling well and you are not experiencing any problems, there is no need to return our call.  We will assume that you have returned to your regular daily activities without incident.  If any biopsies were taken you will be contacted by phone or by letter within the next 1-3 weeks.  Please call us at [phone] if you have not heard about the biopsies in 3 weeks.    SIGNATURES/CONFIDENTIALITY: You and/or your care partner have signed paperwork which will be entered into your electronic medical record.  These signatures attest to the fact that that the information above on your After Visit Summary has been reviewed and is understood.  Full responsibility of the confidentiality of this discharge information lies with you and/or your care-partner.  Avoid NSAID's for 2 weeks ( aspirin, aspirin products, and all anti-inflammatory drugs). Dr. Hilarie Fredrickson said okay to take 81 mg aspirin due to coronary  stents.  Next colonoscopy in 3 years.

## 2014-08-28 ENCOUNTER — Telehealth: Payer: Self-pay

## 2014-08-28 NOTE — Telephone Encounter (Signed)
Left a message at [phone] for the pt call us back if any questions or concerns. maw

## 2014-09-03 ENCOUNTER — Encounter: Payer: Self-pay | Admitting: Internal Medicine

## 2015-06-17 ENCOUNTER — Other Ambulatory Visit (HOSPITAL_COMMUNITY): Payer: Self-pay | Admitting: Adult Health Nurse Practitioner

## 2015-06-17 DIAGNOSIS — M5030 Other cervical disc degeneration, unspecified cervical region: Secondary | ICD-10-CM | POA: Insufficient documentation

## 2015-06-17 DIAGNOSIS — I2510 Atherosclerotic heart disease of native coronary artery without angina pectoris: Secondary | ICD-10-CM

## 2015-06-17 DIAGNOSIS — I2583 Coronary atherosclerosis due to lipid rich plaque: Principal | ICD-10-CM

## 2015-06-24 ENCOUNTER — Ambulatory Visit (HOSPITAL_COMMUNITY)
Admission: RE | Admit: 2015-06-24 | Discharge: 2015-06-24 | Disposition: A | Discharge status: Home / Self Care | Payer: Medicare Other | Source: Ambulatory Visit | Attending: Internal Medicine | Admitting: Internal Medicine

## 2015-06-24 DIAGNOSIS — I340 Nonrheumatic mitral (valve) insufficiency: Secondary | ICD-10-CM | POA: Insufficient documentation

## 2015-06-24 DIAGNOSIS — I2583 Coronary atherosclerosis due to lipid rich plaque: Secondary | ICD-10-CM

## 2015-06-24 DIAGNOSIS — I119 Hypertensive heart disease without heart failure: Secondary | ICD-10-CM | POA: Diagnosis not present

## 2015-06-24 DIAGNOSIS — I2510 Atherosclerotic heart disease of native coronary artery without angina pectoris: Secondary | ICD-10-CM | POA: Diagnosis not present

## 2015-06-24 DIAGNOSIS — I071 Rheumatic tricuspid insufficiency: Secondary | ICD-10-CM | POA: Insufficient documentation

## 2015-06-24 NOTE — Progress Notes (Signed)
  Echocardiogram 2D Echocardiogram has been performed.  Richard [name] 06/24/2015, 3:13 PM

## 2016-04-08 DIAGNOSIS — J3089 Other allergic rhinitis: Secondary | ICD-10-CM | POA: Insufficient documentation

## 2016-04-08 DIAGNOSIS — Z23 Encounter for immunization: Secondary | ICD-10-CM | POA: Insufficient documentation

## 2017-01-04 DIAGNOSIS — Z1159 Encounter for screening for other viral diseases: Secondary | ICD-10-CM | POA: Insufficient documentation

## 2017-01-04 DIAGNOSIS — Z0000 Encounter for general adult medical examination without abnormal findings: Secondary | ICD-10-CM | POA: Insufficient documentation

## 2017-06-21 ENCOUNTER — Encounter: Payer: Self-pay | Admitting: *Deleted

## 2017-06-27 ENCOUNTER — Emergency Department (HOSPITAL_COMMUNITY): Payer: Medicare Other

## 2017-06-27 ENCOUNTER — Observation Stay (HOSPITAL_COMMUNITY): Payer: Medicare Other

## 2017-06-27 ENCOUNTER — Encounter (HOSPITAL_COMMUNITY): Payer: Self-pay | Admitting: Internal Medicine

## 2017-06-27 ENCOUNTER — Observation Stay (HOSPITAL_COMMUNITY)
Admission: EM | Admit: 2017-06-27 | Discharge: 2017-06-29 | Disposition: A | Discharge status: Home / Self Care | Payer: Medicare Other | Attending: Internal Medicine | Admitting: Internal Medicine

## 2017-06-27 ENCOUNTER — Other Ambulatory Visit: Payer: Self-pay

## 2017-06-27 DIAGNOSIS — M6281 Muscle weakness (generalized): Secondary | ICD-10-CM | POA: Diagnosis not present

## 2017-06-27 DIAGNOSIS — Z7982 Long term (current) use of aspirin: Secondary | ICD-10-CM | POA: Insufficient documentation

## 2017-06-27 DIAGNOSIS — G459 Transient cerebral ischemic attack, unspecified: Secondary | ICD-10-CM | POA: Diagnosis not present

## 2017-06-27 DIAGNOSIS — I10 Essential (primary) hypertension: Secondary | ICD-10-CM | POA: Diagnosis present

## 2017-06-27 DIAGNOSIS — F17210 Nicotine dependence, cigarettes, uncomplicated: Secondary | ICD-10-CM | POA: Insufficient documentation

## 2017-06-27 DIAGNOSIS — I639 Cerebral infarction, unspecified: Secondary | ICD-10-CM

## 2017-06-27 DIAGNOSIS — I4892 Unspecified atrial flutter: Secondary | ICD-10-CM

## 2017-06-27 DIAGNOSIS — J439 Emphysema, unspecified: Secondary | ICD-10-CM

## 2017-06-27 DIAGNOSIS — I25110 Atherosclerotic heart disease of native coronary artery with unstable angina pectoris: Secondary | ICD-10-CM | POA: Diagnosis present

## 2017-06-27 DIAGNOSIS — I2510 Atherosclerotic heart disease of native coronary artery without angina pectoris: Secondary | ICD-10-CM | POA: Insufficient documentation

## 2017-06-27 DIAGNOSIS — E119 Type 2 diabetes mellitus without complications: Secondary | ICD-10-CM | POA: Diagnosis not present

## 2017-06-27 DIAGNOSIS — Z7984 Long term (current) use of oral hypoglycemic drugs: Secondary | ICD-10-CM | POA: Insufficient documentation

## 2017-06-27 DIAGNOSIS — R531 Weakness: Secondary | ICD-10-CM

## 2017-06-27 DIAGNOSIS — Z79899 Other long term (current) drug therapy: Secondary | ICD-10-CM | POA: Diagnosis not present

## 2017-06-27 DIAGNOSIS — Z794 Long term (current) use of insulin: Secondary | ICD-10-CM

## 2017-06-27 DIAGNOSIS — E1159 Type 2 diabetes mellitus with other circulatory complications: Secondary | ICD-10-CM

## 2017-06-27 DIAGNOSIS — E118 Type 2 diabetes mellitus with unspecified complications: Secondary | ICD-10-CM

## 2017-06-27 LAB — PROTIME-INR
INR: 0.96
Prothrombin Time: 12.7 seconds (ref 11.4–15.2)

## 2017-06-27 LAB — COMPREHENSIVE METABOLIC PANEL
ALBUMIN: 3.9 g/dL (ref 3.5–5.0)
ALT: 20 U/L (ref 17–63)
AST: 21 U/L (ref 15–41)
Alkaline Phosphatase: 82 U/L (ref 38–126)
Anion gap: 9 (ref 5–15)
BUN: 18 mg/dL (ref 6–20)
CHLORIDE: 101 mmol/L (ref 101–111)
CO2: 26 mmol/L (ref 22–32)
CREATININE: 1.15 mg/dL (ref 0.61–1.24)
Calcium: 9.4 mg/dL (ref 8.9–10.3)
GFR calc Af Amer: >60 mL/min (ref >60)
GFR calc non Af Amer: >60 mL/min (ref >60)
GLUCOSE: 184 mg/dL — AB (ref 65–99)
Potassium: 4.6 mmol/L (ref 3.5–5.1)
SODIUM: 136 mmol/L (ref 135–145)
Total Bilirubin: 0.6 mg/dL (ref 0.3–1.2)
Total Protein: 7.2 g/dL (ref 6.5–8.1)

## 2017-06-27 LAB — CBC
HEMATOCRIT: 44.6 % (ref 39.0–52.0)
Hemoglobin: 14.7 g/dL (ref 13.0–17.0)
MCH: 28.4 pg (ref 26.0–34.0)
MCHC: 33 g/dL (ref 30.0–36.0)
MCV: 86.3 fL (ref 78.0–100.0)
PLATELETS: 190 10*3/uL (ref 150–400)
RBC: 5.17 MIL/uL (ref 4.22–5.81)
RDW: 14.6 % (ref 11.5–15.5)
WBC: 8.1 10*3/uL (ref 4.0–10.5)

## 2017-06-27 LAB — I-STAT CHEM 8, ED
BUN: 21 mg/dL — ABNORMAL HIGH (ref 6–20)
CHLORIDE: 102 mmol/L (ref 101–111)
CREATININE: 1.1 mg/dL (ref 0.61–1.24)
Calcium, Ion: 1.13 mmol/L — ABNORMAL LOW (ref 1.15–1.40)
GLUCOSE: 190 mg/dL — AB (ref 65–99)
HCT: 45 % (ref 39.0–52.0)
Hemoglobin: 15.3 g/dL (ref 13.0–17.0)
POTASSIUM: 4.7 mmol/L (ref 3.5–5.1)
Sodium: 138 mmol/L (ref 135–145)
TCO2: 26 mmol/L (ref 22–32)

## 2017-06-27 LAB — DIFFERENTIAL
BASOS ABS: 0.1 10*3/uL (ref 0.0–0.1)
BASOS PCT: 1 %
Eosinophils Absolute: 0.3 10*3/uL (ref 0.0–0.7)
Eosinophils Relative: 3 %
Lymphocytes Relative: 31 %
Lymphs Abs: 2.5 10*3/uL (ref 0.7–4.0)
MONOS PCT: 6 %
Monocytes Absolute: 0.5 10*3/uL (ref 0.1–1.0)
Neutro Abs: 4.8 10*3/uL (ref 1.7–7.7)
Neutrophils Relative %: 59 %

## 2017-06-27 LAB — HEMOGLOBIN A1C
HEMOGLOBIN A1C: 11.5 % — AB (ref 4.8–5.6)
MEAN PLASMA GLUCOSE: 283.35 mg/dL

## 2017-06-27 LAB — TSH: TSH: 3.672 u[IU]/mL (ref 0.350–4.500)

## 2017-06-27 LAB — I-STAT TROPONIN, ED: Troponin i, poc: 0 ng/mL (ref 0.00–0.08)

## 2017-06-27 LAB — APTT: aPTT: 25 seconds (ref 24–36)

## 2017-06-27 LAB — GLUCOSE, CAPILLARY: Glucose-Capillary: 333 mg/dL — ABNORMAL HIGH (ref 65–99)

## 2017-06-27 MED ORDER — ATORVASTATIN CALCIUM 40 MG PO TABS
40.0000 mg | ORAL_TABLET | Freq: Every day | ORAL | Status: DC
  Administered 2017-06-28: 40 mg via ORAL
  Filled 2017-06-27: qty 1

## 2017-06-27 MED ORDER — ASPIRIN EC 81 MG PO TBEC: 81.0000 mg | DELAYED_RELEASE_TABLET | Freq: Every day | ORAL | Status: DC

## 2017-06-27 MED ORDER — ACETAMINOPHEN 160 MG/5ML PO SOLN: 650.0000 mg | ORAL | Status: DC | PRN

## 2017-06-27 MED ORDER — IOPAMIDOL (ISOVUE-370) INJECTION 76%
INTRAVENOUS | Status: AC
  Filled 2017-06-27: qty 100

## 2017-06-27 MED ORDER — IOPAMIDOL (ISOVUE-370) INJECTION 76%
50.0000 mL | Freq: Once | INTRAVENOUS | Status: AC | PRN
  Administered 2017-06-27: 50 mL via INTRAVENOUS

## 2017-06-27 MED ORDER — ACETAMINOPHEN 325 MG PO TABS
650.0000 mg | ORAL_TABLET | ORAL | Status: DC | PRN
  Administered 2017-06-28 – 2017-06-29 (×2): 650 mg via ORAL
  Filled 2017-06-27 (×2): qty 2

## 2017-06-27 MED ORDER — INSULIN ASPART 100 UNIT/ML ~~LOC~~ SOLN
0.0000 [IU] | Freq: Three times a day (TID) | SUBCUTANEOUS | Status: DC
  Administered 2017-06-28: 5 [IU] via SUBCUTANEOUS
  Administered 2017-06-28: 3 [IU] via SUBCUTANEOUS
  Administered 2017-06-28 – 2017-06-29 (×3): 5 [IU] via SUBCUTANEOUS

## 2017-06-27 MED ORDER — IOPAMIDOL (ISOVUE-370) INJECTION 76%
INTRAVENOUS | Status: AC
  Filled 2017-06-27: qty 50

## 2017-06-27 MED ORDER — ASPIRIN-DIPYRIDAMOLE ER 25-200 MG PO CP12
1.0000 | ORAL_CAPSULE | Freq: Two times a day (BID) | ORAL | Status: DC
  Administered 2017-06-28: 1 via ORAL
  Filled 2017-06-27: qty 1

## 2017-06-27 MED ORDER — ENOXAPARIN SODIUM 40 MG/0.4ML ~~LOC~~ SOLN
40.0000 mg | SUBCUTANEOUS | Status: DC
  Administered 2017-06-27: 40 mg via SUBCUTANEOUS
  Filled 2017-06-27: qty 0.4

## 2017-06-27 MED ORDER — SODIUM CHLORIDE 0.9 % IV SOLN
INTRAVENOUS | Status: DC
  Administered 2017-06-27: 22:00:00 via INTRAVENOUS

## 2017-06-27 MED ORDER — INSULIN ASPART 100 UNIT/ML ~~LOC~~ SOLN
0.0000 [IU] | Freq: Every day | SUBCUTANEOUS | Status: DC
  Administered 2017-06-27: 4 [IU] via SUBCUTANEOUS

## 2017-06-27 MED ORDER — ACETAMINOPHEN 650 MG RE SUPP: 650.0000 mg | RECTAL | Status: DC | PRN

## 2017-06-27 MED ORDER — SENNOSIDES-DOCUSATE SODIUM 8.6-50 MG PO TABS: 1.0000 | ORAL_TABLET | Freq: Every evening | ORAL | Status: DC | PRN

## 2017-06-27 MED ORDER — METHOCARBAMOL 500 MG PO TABS: 500.0000 mg | ORAL_TABLET | Freq: Four times a day (QID) | ORAL | Status: DC | PRN

## 2017-06-27 MED ORDER — NICOTINE 21 MG/24HR TD PT24
21.0000 mg | MEDICATED_PATCH | Freq: Every day | TRANSDERMAL | Status: DC
  Administered 2017-06-27 – 2017-06-28 (×2): 21 mg via TRANSDERMAL
  Filled 2017-06-27 (×2): qty 1

## 2017-06-27 MED ORDER — STROKE: EARLY STAGES OF RECOVERY BOOK
Freq: Once | Status: DC
  Filled 2017-06-27: qty 1

## 2017-06-27 NOTE — ED Notes (Addendum)
RN Roselyn Reef stated that if pt is taken to MRI before the floor picks pt up, pt can be brought from MRI to 5W.  MRI notified.

## 2017-06-27 NOTE — ED Notes (Signed)
Carb modified dinner tray ordered 

## 2017-06-27 NOTE — H&P (Signed)
History and Physical    BUSTER SCHUELLER MPN:[id number] DOB: 1945-12-17 DOA: 06/27/2017  PCP: Dione Housekeeper, MD Consultants:  None Patient coming from:  Home - lives with wife; Donald Prose: Wife, [phone]; [phone]  Chief Complaint:  Neurologic symptoms  HPI: Richard [name] is a 72 y.o. male with medical history significant of CAD; HTN; HLD; and DM presenting with neurologic symptoms.   First, he noticed left-sided leg cramping.  About an hour later, he felt like his left arm "gave up and didn't want to move".  Then his face and lips started tingling on both sides.  Symptoms appear to have eased up, but he is not sure why.  The leg still cramps if he pulls his toes in.  He had an appointment this AM for a diabetes check up; he told the doctor this AM and she sent him to the ER.  He has intermittent "sinking spells" - it's like he loses his breath and gets real lightheaded.  It lasts a few seconds and then goes away and his head feels hot.  No changes in recet acitivites.  He has been feeling at baseline but did complain of left leg cramping recently.    +sinus congestion and pressure, PND, wet cough.  ED Course:  Stroke evaluation - concern for code stroke.  Symptoms too old for tPA.  Mild LE weakness, ?cramping.  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  Review of Systems: As per HPI; otherwise review of systems reviewed and negative.   Ambulatory Status:  Ambulates without assistance  Past Medical History:  Diagnosis Date  . Diabetes mellitus   . HLD (hyperlipidemia)   . Hypertension   . MI (myocardial infarction) ([place])    cardiac stents x2  . Pneumonia     Past Surgical History:  Procedure Laterality Date  . BALLOON ANGIOPLASTY, ARTERY    . CARDIAC ELECTROPHYSIOLOGY MAPPING AND ABLATION    . CARPAL TUNNEL RELEASE Bilateral   . cervical neck fusion     x 4  . CORONARY STENT PLACEMENT     x 2  . ROTATOR CUFF REPAIR Right    x 4  . TIBIA FRACTURE SURGERY Left      Social History   Socioeconomic History  . Marital status: Married    Spouse name: Not on file  . Number of children: 4  . Years of education: Not on file  . Highest education level: Not on file  Occupational History  . Occupation: truck Animator Needs  . Financial resource strain: Not on file  . Food insecurity:    Worry: Not on file    Inability: Not on file  . Transportation needs:    Medical: Not on file    Non-medical: Not on file  Tobacco Use  . Smoking status: Current Every Day Smoker    Packs/day: 1.00    Years: 54.00    Pack years: 54.00    Types: Cigarettes  . Smokeless tobacco: Never Used  Substance and Sexual Activity  . Alcohol use: No    Alcohol/week: 0.0 oz    Comment: rare  . Drug use: No  . Sexual activity: Not on file  Lifestyle  . Physical activity:    Days per week: Not on file    Minutes per session: Not on file  . Stress: Not on file  Relationships  . Social connections:    Talks on phone: Not on file    Gets together: Not on  file    Attends religious service: Not on file    Active member of club or organization: Not on file    Attends meetings of clubs or organizations: Not on file    Relationship status: Not on file  . Intimate partner violence:    Fear of current or ex partner: Not on file    Emotionally abused: Not on file    Physically abused: Not on file    Forced sexual activity: Not on file  Other Topics Concern  . Not on file  Social History Narrative  . Not on file    No Known Allergies  Family History  Problem Relation Age of Onset  . Diabetes Mother   . Heart disease Mother   . Heart disease Brother   . Emphysema Father   . Lung cancer Brother        x 2  . Colon cancer Neg Hx   . Esophageal cancer Neg Hx   . Rectal cancer Neg Hx   . Stomach cancer Neg Hx   . Prostate cancer Neg Hx     Prior to Admission medications   Medication Sig Start Date End Date Taking? Authorizing Provider  acetaminophen  (TYLENOL) 325 MG tablet Take 325 mg by mouth every 6 (six) hours as needed for fever.   Yes [provider]  aspirin 325 MG tablet Take 325 mg by mouth daily.   Yes [provider]  glipiZIDE (GLUCOTROL) 10 MG tablet Take 10 mg by mouth 2 (two) times daily before a meal.   Yes [provider]  glucose blood (ONE TOUCH ULTRA TEST) test strip 1 each. 10/02/13  Yes [provider]  metFORMIN (GLUCOPHAGE) 1000 MG tablet Take 1,000 mg by mouth 2 (two) times daily with a meal.   Yes [provider]  metoprolol (LOPRESSOR) 100 MG tablet Take 100 mg by mouth daily. 10/07/12  Yes [provider]  aspirin 81 MG tablet Take 81 mg by mouth daily.    [provider]    Physical Exam: Vitals:   06/27/17 1125 06/27/17 1728  BP: (!) 176/89   Pulse: 61   Resp: 16   Temp: 97.8 F (36.6 C) 97.8 F (36.6 C)  TempSrc: Oral   SpO2: 98%      General:  Appears calm and comfortable and is NAD Eyes:  PERRL, EOMI, normal lids, iris ENT:  grossly normal hearing, lips & tongue, mmm Neck:  no LAD, masses or thyromegaly; no carotid bruits Cardiovascular:  RRR, no m/r/g. No LE edema.  Respiratory:   CTA bilaterally with no wheezes/rales/rhonchi.  Normal respiratory effort. Abdomen:  soft, NT, ND, NABS Skin:  no rash or induration seen on limited exam Musculoskeletal:  grossly normal tone BUE/BLE, good ROM, no bony abnormality Lower extremity:  No LE edema.  Limited foot exam with no ulcerations.  2+ distal pulses. Psychiatric:  grossly normal mood and affect, speech fluent and appropriate, AOx3 Neurologic:  CN 2-12 grossly intact, moves all extremities in coordinated fashion, sensation intact    Radiological Exams on Admission: Ct Angio Head W Or Wo Contrast  Addendum Date: 06/27/2017   ADDENDUM REPORT: 06/27/2017 12:42 ADDENDUM: Study discussed by telephone with Dr. Leonette Monarch in the ED on 06/27/2017 at 1233 hours. Electronically Signed   By: Genevie Ann  M.D.   On: 06/27/2017 12:42   Result Date: 06/27/2017 CLINICAL DATA:  72 year old male code stroke. Last seen normal 0730 hours. Left side weakness. EXAM: CT ANGIOGRAPHY  HEAD AND NECK TECHNIQUE: Multidetector CT imaging of the head and neck was performed using the standard protocol during bolus administration of intravenous contrast. Multiplanar CT image reconstructions and MIPs were obtained to evaluate the vascular anatomy. Carotid stenosis measurements (when applicable) are obtained utilizing NASCET criteria, using the distal internal carotid diameter as the denominator. CONTRAST:  62mL ISOVUE-370 IOPAMIDOL (ISOVUE-370) INJECTION 76% COMPARISON:  Head CT without contrast 1139 hours today. CTA head and neck 09/23/2010. 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 ACDF hardware remains in place at C6-C7. Mild anterolisthesis at C7-T1 associated with moderate to severe facet degeneration is stable. No acute osseous abnormality identified. Upper chest: Centrilobular and paraseptal emphysema. No upper lung nodule. No superior mediastinal lymphadenopathy. Other neck: Negative.  No neck mass or lymphadenopathy. Aortic arch: Soft and calcified aortic arch and great vessel origin atherosclerosis has progressed since 2012. Three vessel arch configuration. Right carotid system: No brachiocephalic or right CCA origin stenosis despite plaque. Negative right carotid bifurcation, cervical right ICA. Left carotid system: Progressed left CCA origin soft and calcified plaque since 2012 but less than 50 % stenosis with respect to the distal vessel results. Similar soft plaque along the medial left CCA at the level of the larynx without stenosis. However, the left carotid bifurcation remains widely patent with minimal plaque. No cervical left ICA stenosis. Vertebral arteries: No significant proximal right subclavian artery stenosis despite plaque. Soft and calcified plaque at  the right vertebral artery origin without stenosis. The right vertebral artery is patent to the skull base without stenosis. The right V 2 segment is tortuous. Progressed proximal left subclavian artery soft and calcified plaque since 2012 but no hemodynamically significant stenosis. However, the left vertebral artery today is poorly enhancing from its origin and throughout its course to the skull base. There is chronic origin stenosis which was at least moderate but appears progressed since 2012. CTA HEAD Posterior circulation: The right PICA and distal right vertebral artery remain patent and appear stable since 2012 with mild to moderate atherosclerotic irregularity but only mild associated stenosis to the vertebrobasilar junction. The left vertebral artery V4 segment appears patent but is probably supplied in a retrograde fashion from the vertebrobasilar junction to the patent left PICA origin. The left vertebral proximal to the PICA appears occluded. The basilar artery is patent without stenosis. The SCA and PCA origins are patent and stable. Posterior communicating arteries are diminutive or absent. There is mild irregularity of the left PCA P1 and P2 segments. The bilateral PCA branches are otherwise within normal limits. Anterior circulation: Both ICA siphons are patent. Bilateral siphon calcified plaque appears stable since 2012. Mild siphon stenosis on the left. Mild to moderate stenosis on the right at the distal cavernous segment and supraclinoid segment. The MCA and ACA origins are patent and stable with mild irregularity. Anterior communicating artery and bilateral ACA branches are within normal limits. Left MCA M1 segment, bifurcation, and left MCA branches are stable and within normal limits. The right MCA M1 segment is tortuous and bifurcates early. The right MCA branches demonstrate increased mild irregularity but appear patent. No branch occlusion identified. Venous sinuses: Patent. Anatomic  variants: None. Review of the MIP images confirms the above findings IMPRESSION: 1. 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 The distal left V4 segment and  left PICA are patent and probably supplied in a retrograde fashion from the vertebrobasilar junction. 2. Progressed aortic arch and great vessel atherosclerosis since 2012 but no other hemodynamically significant stenosis in the neck. There is only minimal cervical ICA atherosclerosis. 3. Intracranial atherosclerosis has mildly progressed since 2012 with mild to moderate right ICA siphon stenosis. No other significant intracranial stenosis, and no intracranial arterial occlusion. 4. Chronic widespread cervical spine fusion. 5. Emphysema (ICD10-J43.9) and Aortic Atherosclerosis (ICD10-I70.0). Electronically Signed: By: Genevie Ann M.D. On: 06/27/2017 12:30   Ct Angio Neck W Or Wo Contrast  Addendum Date: 06/27/2017   ADDENDUM REPORT: 06/27/2017 12:42 ADDENDUM: Study discussed by telephone with Dr. Leonette Monarch in the ED on 06/27/2017 at 1233 hours. Electronically Signed   By: Genevie Ann M.D.   On: 06/27/2017 12:42   Result Date: 06/27/2017 CLINICAL DATA:  72 year old male code stroke. Last seen normal 0730 hours. Left side weakness. EXAM: CT ANGIOGRAPHY HEAD AND NECK TECHNIQUE: Multidetector CT imaging of the head and neck was performed using the standard protocol during bolus administration of intravenous contrast. Multiplanar CT image reconstructions and MIPs were obtained to evaluate the vascular anatomy. Carotid stenosis measurements (when applicable) are obtained utilizing NASCET criteria, using the distal internal carotid diameter as the denominator. CONTRAST:  67mL ISOVUE-370 IOPAMIDOL (ISOVUE-370) INJECTION 76% COMPARISON:  Head CT without contrast 1139 hours today. CTA head and  neck 09/23/2010. 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 ACDF hardware remains in place at C6-C7. Mild anterolisthesis at C7-T1 associated with moderate to severe facet degeneration is stable. No acute osseous abnormality identified. Upper chest: Centrilobular and paraseptal emphysema. No upper lung nodule. No superior mediastinal lymphadenopathy. Other neck: Negative.  No neck mass or lymphadenopathy. Aortic arch: Soft and calcified aortic arch and great vessel origin atherosclerosis has progressed since 2012. Three vessel arch configuration. Right carotid system: No brachiocephalic or right CCA origin stenosis despite plaque. Negative right carotid bifurcation, cervical right ICA. Left carotid system: Progressed left CCA origin soft and calcified plaque since 2012 but less than 50 % stenosis with respect to the distal vessel results. Similar soft plaque along the medial left CCA at the level of the larynx without stenosis. However, the left carotid bifurcation remains widely patent with minimal plaque. No cervical left ICA stenosis. Vertebral arteries: No significant proximal right subclavian artery stenosis despite plaque. Soft and calcified plaque at the right vertebral artery origin without stenosis. The right vertebral artery is patent to the skull base without stenosis. The right V 2 segment is tortuous. Progressed proximal left subclavian artery soft and calcified plaque since 2012 but no hemodynamically significant stenosis. However, the left vertebral artery today is poorly enhancing from its origin and throughout its course to the skull base. There is chronic origin stenosis which was at least moderate but appears progressed since 2012. CTA HEAD Posterior circulation: The right PICA and distal right vertebral artery remain patent and appear stable since 2012 with mild to moderate atherosclerotic irregularity but only mild associated stenosis to  the vertebrobasilar junction. The left vertebral artery V4 segment appears patent but is probably supplied in a retrograde fashion from the vertebrobasilar junction to the patent left PICA origin. The left vertebral proximal to the PICA appears occluded. The basilar artery is patent without stenosis. The SCA and PCA origins are patent and stable. Posterior communicating arteries are diminutive or absent. There is mild irregularity of the left PCA P1 and P2 segments. The bilateral PCA branches  are otherwise within normal limits. Anterior circulation: Both ICA siphons are patent. Bilateral siphon calcified plaque appears stable since 2012. Mild siphon stenosis on the left. Mild to moderate stenosis on the right at the distal cavernous segment and supraclinoid segment. The MCA and ACA origins are patent and stable with mild irregularity. Anterior communicating artery and bilateral ACA branches are within normal limits. Left MCA M1 segment, bifurcation, and left MCA branches are stable and within normal limits. The right MCA M1 segment is tortuous and bifurcates early. The right MCA branches demonstrate increased mild irregularity but appear patent. No branch occlusion identified. Venous sinuses: Patent. Anatomic variants: None. Review of the MIP images confirms the above findings IMPRESSION: 1. 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 The distal left V4 segment and left PICA are patent and probably supplied in a retrograde fashion from the vertebrobasilar junction. 2. Progressed aortic arch and great vessel atherosclerosis since 2012 but no other hemodynamically significant stenosis in the neck. There is only minimal cervical ICA atherosclerosis. 3. Intracranial atherosclerosis has mildly progressed since 2012 with mild to moderate  right ICA siphon stenosis. No other significant intracranial stenosis, and no intracranial arterial occlusion. 4. Chronic widespread cervical spine fusion. 5. Emphysema (ICD10-J43.9) and Aortic Atherosclerosis (ICD10-I70.0). Electronically Signed: By: Genevie Ann M.D. On: 06/27/2017 12:30   Ct Head Code Stroke Wo Contrast`  Addendum Date: 06/27/2017   ADDENDUM REPORT: 06/27/2017 11:59 ADDENDUM: These results were called by telephone at the time of interpretation on 06/27/2017 at 11:58 am to Dr. Cheral Marker, who verbally acknowledged these results. Electronically Signed   By: Franchot Gallo M.D.   On: 06/27/2017 11:59   Result Date: 06/27/2017 CLINICAL DATA:  Code stroke.  Left-sided weakness EXAM: CT HEAD WITHOUT CONTRAST TECHNIQUE: Contiguous axial images were obtained from the base of the skull through the vertex without intravenous contrast. COMPARISON:  CT head 09/23/2010 FINDINGS: Brain: No evidence of acute infarction, hemorrhage, hydrocephalus, extra-axial collection or mass lesion/mass effect. Vascular: Negative for hyperdense vessel Skull: Negative Sinuses/Orbits: Negative Other: None ASPECTS ([place] Stroke Program Early CT Score) - Ganglionic level infarction (caudate, lentiform nuclei, internal capsule, insula, M1-M3 cortex): 7 - Supraganglionic infarction (M4-M6 cortex): 3 Total score (0-10 with 10 being normal): 10 IMPRESSION: 1. Normal CT head 2. ASPECTS is 10 Electronically Signed: By: Franchot Gallo M.D. On: 06/27/2017 11:46    EKG: Independently reviewed.  NSR with rate 67; nonspecific ST changes with no evidence of acute ischemia, NSCSLT   Labs on Admission: I have personally reviewed the available labs and imaging studies at the time of the admission.  Pertinent labs:   Glucose 184 Troponin 0.00 Normal CBC INR 0.96  Assessment/Plan Principal Problem:   TIA (transient ischemic attack) Active Problems:   Hypertension   Diabetes ([place])   CAD (coronary artery disease)   TIA -Symptoms  are somewhat concerning for TIA/CVA -Will place in observation status for CVA/TIA evaluation -Telemetry monitoring -MRI -Echo -Risk stratification with FLP, A1c; will also check TSH and UDS -He was taking ASA daily and so this would be considered ASA failure. -While DAPT with ASA and Plavix would generally be preferred therapy, the patient adamantly refuses Plavix.  He is willing to try Aggrenox so will start this BID today. -PT/OT/ST/Nutrition Consults  HTN -Allow permissive HTN -Treat BP only if >220/120, and then with  goal of 15% reduction -Hold Lopressor and plan to restart in 48-72 hours  HLD -Check FLP -Will plan to start Lipitor 40 mg daily empirically  DM -Will check A1c -hold Glucophage and Glucotrol -Cover with moderate-scale SSI    DVT prophylaxis:  Lovenox Code Status: Full - confirmed with patient/family Family Communication: Wife present throughout entire evaluation Disposition Plan:  Home once clinically improved Consults called: Neurology; PT/OT/ST/Nutrition  Admission status: It is my clinical opinion that referral for OBSERVATION is reasonable and necessary in this patient based on the above information provided. The aforementioned taken together are felt to place the patient at high risk for further clinical deterioration. However it is anticipated that the patient may be medically stable for discharge from the hospital within 24 to 48 hours.    Karmen Bongo MD [facility]  If note is complete, please contact covering daytime or nighttime physician. [email or web address] Password [facility]  06/27/2017, 6:21 PM

## 2017-06-27 NOTE — ED Triage Notes (Signed)
Pt states he woke up with "leg cramping to left calf" and then around 7 or 730 began having left arm weakness anf left facial numbness.

## 2017-06-27 NOTE — ED Provider Notes (Addendum)
[place] EMERGENCY DEPARTMENT Provider Note  CSN: [id number] Arrival date & time: 06/27/17 1117  Chief Complaint(s) leg cramping and facial tingling   HPI Richard [name] is a 72 y.o. male with a history of hypertension, hyperlipidemia, diabetes, prior MI status post stenting who presents to the emergency department with left upper and lower extremity weakness.  Patient reports that he woke up this morning feeling like his leg was cramping.  1 hour after awakening, patient reported feeling sudden onset of left arm weakness.  He went to his primary care provider who evaluated him and sent him here for possible stroke.  Patient denies any prior history of stroke.  No alleviating or aggravating factors of the weakness but the patient does report that the leg cramps is exacerbated with movement of his left lower extremity.  He denies any prior history of DVTs.  Denies any trauma.  Denies any recent fevers or infections.  Endorses chronic cough and intermittent chronic headaches.  Currently denies any other physical complaints.  HPI  Past Medical History Past Medical History:  Diagnosis Date  . Diabetes mellitus   . HLD (hyperlipidemia)   . Hypertension   . MI (myocardial infarction) ([place])    cardiac stents x2  . Pneumonia    Patient Active Problem List   Diagnosis Date Noted  . Hypertension 11/14/2012  . RLL pneumonia ([place]) 11/14/2012  . Diabetes ([place]) 11/14/2012  . CAD (coronary artery disease) 11/14/2012  . HIATAL HERNIA 11/24/1983   Home Medication(s) Prior to Admission medications   Medication Sig Start Date End Date Taking? Authorizing Provider  acetaminophen (TYLENOL) 325 MG tablet Take 325 mg by mouth every 6 (six) hours as needed for fever.   Yes [provider]  aspirin 325 MG tablet Take 325 mg by mouth daily.   Yes [provider]  glipiZIDE (GLUCOTROL) 10 MG tablet Take 10 mg by mouth 2 (two) times daily before a meal.   Yes [provider]  glucose blood (ONE TOUCH ULTRA TEST) test strip 1 each. 10/02/13  Yes [provider]  metFORMIN (GLUCOPHAGE) 1000 MG tablet Take 1,000 mg by mouth 2 (two) times daily with a meal.   Yes [provider]  metoprolol (LOPRESSOR) 100 MG tablet Take 100 mg by mouth daily. 10/07/12  Yes [provider]  aspirin 81 MG tablet Take 81 mg by mouth daily.    [provider]                                                                                                                                    Past Surgical History Past Surgical History:  Procedure Laterality Date  . BALLOON ANGIOPLASTY, ARTERY    . CARDIAC ELECTROPHYSIOLOGY MAPPING AND ABLATION    . CARPAL TUNNEL RELEASE Bilateral   . cervical neck fusion     x 4  . CORONARY STENT PLACEMENT  x 2  . ROTATOR CUFF REPAIR Right    x 4  . TIBIA FRACTURE SURGERY Left    Family History Family History  Problem Relation Age of Onset  . Diabetes Mother   . Heart disease Mother   . Heart disease Brother   . Emphysema Father   . Lung cancer Brother        x 2  . Colon cancer Neg Hx   . Esophageal cancer Neg Hx   . Rectal cancer Neg Hx   . Stomach cancer Neg Hx   . Prostate cancer Neg Hx     Social History Social History   Tobacco Use  . Smoking status: Current Every Day Smoker    Packs/day: 1.00    Types: Cigarettes  . Smokeless tobacco: Never Used  Substance Use Topics  . Alcohol use: No    Alcohol/week: 0.0 oz    Comment: rare  . Drug use: No   Allergies Patient has no known allergies.  Review of Systems Review of Systems All other systems are reviewed and are negative for acute change except as noted in the HPI  Physical Exam Vital Signs  I have reviewed the triage vital signs BP (!) 176/89 (BP Location: Right Arm)   Pulse 61   Temp 97.8 F (36.6 C) (Oral)   Resp 16   SpO2 98%   Physical Exam  Constitutional: He is oriented to person, place, and time. He  appears well-developed and well-nourished. No distress.  HENT:  Head: Normocephalic and atraumatic.  Nose: Nose normal.  Eyes: Pupils are equal, round, and reactive to light. Conjunctivae and EOM are normal. Right eye exhibits no discharge. Left eye exhibits no discharge. No scleral icterus.  Neck: Normal range of motion. Neck supple.  Cardiovascular: Normal rate and regular rhythm. Exam reveals no gallop and no friction rub.  No murmur heard. Pulmonary/Chest: Effort normal and breath sounds normal. No stridor. No respiratory distress. He has no rales.  Abdominal: Soft. He exhibits no distension. There is no tenderness.  Musculoskeletal: He exhibits no edema or tenderness.  Neurological: He is alert and oriented to person, place, and time.  Mental Status:  Alert and oriented to person, place, and time.  Attention and concentration normal.  Speech clear.  Recent memory is intact  Cranial Nerves:  II Visual Fields: Intact to confrontation. Visual fields intact. III, IV, VI: Pupils equal and reactive to light and near. Full eye movement without nystagmus  V Facial Sensation: Normal. No weakness of masticatory muscles  VII: No facial weakness or asymmetry  VIII Auditory Acuity: Grossly normal  IX/X: The uvula is midline; the palate elevates symmetrically  XI: Normal sternocleidomastoid and trapezius strength  XII: The tongue is midline. No atrophy or fasciculations.   Motor System: Muscle Strength: 4/5 in left upper extremity. 4+/5 in the left lower extremity.  5 out of 5 strength in the right upper and lower extremity. No pronation or drift.  Muscle Tone: Tone and muscle bulk are normal in the upper and lower extremities.   Reflexes: DTRs: 1+ and symmetrical in all four extremities. No Clonus Coordination: Intact finger-to-nose, heel-to-shin. No tremor.  Sensation: Decreased sensation in the left lower and upper extremity Gait: Deferred   Skin: Skin is warm and dry. No rash noted. He  is not diaphoretic. No erythema.  Psychiatric: He has a normal mood and affect.  Vitals reviewed.   ED Results and Treatments Labs (all labs ordered are listed, but only abnormal  results are displayed) Labs Reviewed  COMPREHENSIVE METABOLIC PANEL - Abnormal; Notable for the following components:      Result Value   Glucose, Bld 184 (*)    All other components within normal limits  I-STAT CHEM 8, ED - Abnormal; Notable for the following components:   BUN 21 (*)    Glucose, Bld 190 (*)    Calcium, Ion 1.13 (*)    All other components within normal limits  PROTIME-INR  APTT  CBC  DIFFERENTIAL  I-STAT TROPONIN, ED  CBG MONITORING, ED                                                                                                                         EKG  EKG Interpretation  Date/Time:  Monday Jun 27 2017 12:11:41 EDT Ventricular Rate:  67 PR Interval:    QRS Duration: 97 QT Interval:  408 QTC Calculation: 431 R Axis:   -13 Text Interpretation:  Sinus rhythm Anteroseptal infarct, old No significant change since last tracing Confirmed by Addison Lank [phone]) on 06/27/2017 12:20:29 PM      Radiology Ct Angio Head W Or Wo Contrast  Addendum Date: 06/27/2017   ADDENDUM REPORT: 06/27/2017 12:42 ADDENDUM: Study discussed by telephone with Dr. Leonette Monarch in the ED on 06/27/2017 at 1233 hours. Electronically Signed   By: Genevie Ann M.D.   On: 06/27/2017 12:42   Result Date: 06/27/2017 CLINICAL DATA:  72 year old male code stroke. Last seen normal 0730 hours. Left side weakness. EXAM: CT ANGIOGRAPHY HEAD AND NECK TECHNIQUE: Multidetector CT imaging of the head and neck was performed using the standard protocol during bolus administration of intravenous contrast. Multiplanar CT image reconstructions and MIPs were obtained to evaluate the vascular anatomy. Carotid stenosis measurements (when applicable) are obtained utilizing NASCET criteria, using the distal internal carotid diameter as the  denominator. CONTRAST:  39mL ISOVUE-370 IOPAMIDOL (ISOVUE-370) INJECTION 76% COMPARISON:  Head CT without contrast 1139 hours today. CTA head and neck 09/23/2010. 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 ACDF hardware remains in place at C6-C7. Mild anterolisthesis at C7-T1 associated with moderate to severe facet degeneration is stable. No acute osseous abnormality identified. Upper chest: Centrilobular and paraseptal emphysema. No upper lung nodule. No superior mediastinal lymphadenopathy. Other neck: Negative.  No neck mass or lymphadenopathy. Aortic arch: Soft and calcified aortic arch and great vessel origin atherosclerosis has progressed since 2012. Three vessel arch configuration. Right carotid system: No brachiocephalic or right CCA origin stenosis despite plaque. Negative right carotid bifurcation, cervical right ICA. Left carotid system: Progressed left CCA origin soft and calcified plaque since 2012 but less than 50 % stenosis with respect to the distal vessel results. Similar soft plaque along the medial left CCA at the level of the larynx without stenosis. However, the left carotid bifurcation remains widely patent with minimal plaque. No cervical left ICA stenosis. Vertebral arteries: No significant proximal right subclavian artery stenosis despite plaque. Soft and calcified  plaque at the right vertebral artery origin without stenosis. The right vertebral artery is patent to the skull base without stenosis. The right V 2 segment is tortuous. Progressed proximal left subclavian artery soft and calcified plaque since 2012 but no hemodynamically significant stenosis. However, the left vertebral artery today is poorly enhancing from its origin and throughout its course to the skull base. There is chronic origin stenosis which was at least moderate but appears progressed since 2012. CTA HEAD Posterior circulation: The right PICA and distal right  vertebral artery remain patent and appear stable since 2012 with mild to moderate atherosclerotic irregularity but only mild associated stenosis to the vertebrobasilar junction. The left vertebral artery V4 segment appears patent but is probably supplied in a retrograde fashion from the vertebrobasilar junction to the patent left PICA origin. The left vertebral proximal to the PICA appears occluded. The basilar artery is patent without stenosis. The SCA and PCA origins are patent and stable. Posterior communicating arteries are diminutive or absent. There is mild irregularity of the left PCA P1 and P2 segments. The bilateral PCA branches are otherwise within normal limits. Anterior circulation: Both ICA siphons are patent. Bilateral siphon calcified plaque appears stable since 2012. Mild siphon stenosis on the left. Mild to moderate stenosis on the right at the distal cavernous segment and supraclinoid segment. The MCA and ACA origins are patent and stable with mild irregularity. Anterior communicating artery and bilateral ACA branches are within normal limits. Left MCA M1 segment, bifurcation, and left MCA branches are stable and within normal limits. The right MCA M1 segment is tortuous and bifurcates early. The right MCA branches demonstrate increased mild irregularity but appear patent. No branch occlusion identified. Venous sinuses: Patent. Anatomic variants: None. Review of the MIP images confirms the above findings IMPRESSION: 1. 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 The distal left V4 segment and left PICA are patent and probably supplied in a retrograde fashion from the vertebrobasilar junction. 2. Progressed aortic arch and great vessel atherosclerosis since 2012 but no other hemodynamically significant stenosis in  the neck. There is only minimal cervical ICA atherosclerosis. 3. Intracranial atherosclerosis has mildly progressed since 2012 with mild to moderate right ICA siphon stenosis. No other significant intracranial stenosis, and no intracranial arterial occlusion. 4. Chronic widespread cervical spine fusion. 5. Emphysema (ICD10-J43.9) and Aortic Atherosclerosis (ICD10-I70.0). Electronically Signed: By: Genevie Ann M.D. On: 06/27/2017 12:30   Ct Angio Neck W Or Wo Contrast  Addendum Date: 06/27/2017   ADDENDUM REPORT: 06/27/2017 12:42 ADDENDUM: Study discussed by telephone with Dr. Leonette Monarch in the ED on 06/27/2017 at 1233 hours. Electronically Signed   By: Genevie Ann M.D.   On: 06/27/2017 12:42   Result Date: 06/27/2017 CLINICAL DATA:  72 year old male code stroke. Last seen normal 0730 hours. Left side weakness. EXAM: CT ANGIOGRAPHY HEAD AND NECK TECHNIQUE: Multidetector CT imaging of the head and neck was performed using the standard protocol during bolus administration of intravenous contrast. Multiplanar CT image reconstructions and MIPs were obtained to evaluate the vascular anatomy. Carotid stenosis measurements (when applicable) are obtained utilizing NASCET criteria, using the distal internal carotid diameter as the denominator. CONTRAST:  34mL ISOVUE-370 IOPAMIDOL (ISOVUE-370) INJECTION 76% COMPARISON:  Head CT without contrast 1139 hours today. CTA head and neck 09/23/2010. FINDINGS: CTA NECK Skeleton: Widespread prior cervical spine fusion  with solid ankylosis/arthrodesis. ACDF hardware remains in place at C6-C7. Mild anterolisthesis at C7-T1 associated with moderate to severe facet degeneration is stable. No acute osseous abnormality identified. Upper chest: Centrilobular and paraseptal emphysema. No upper lung nodule. No superior mediastinal lymphadenopathy. Other neck: Negative.  No neck mass or lymphadenopathy. Aortic arch: Soft and calcified aortic arch and great vessel origin atherosclerosis has progressed  since 2012. Three vessel arch configuration. Right carotid system: No brachiocephalic or right CCA origin stenosis despite plaque. Negative right carotid bifurcation, cervical right ICA. Left carotid system: Progressed left CCA origin soft and calcified plaque since 2012 but less than 50 % stenosis with respect to the distal vessel results. Similar soft plaque along the medial left CCA at the level of the larynx without stenosis. However, the left carotid bifurcation remains widely patent with minimal plaque. No cervical left ICA stenosis. Vertebral arteries: No significant proximal right subclavian artery stenosis despite plaque. Soft and calcified plaque at the right vertebral artery origin without stenosis. The right vertebral artery is patent to the skull base without stenosis. The right V 2 segment is tortuous. Progressed proximal left subclavian artery soft and calcified plaque since 2012 but no hemodynamically significant stenosis. However, the left vertebral artery today is poorly enhancing from its origin and throughout its course to the skull base. There is chronic origin stenosis which was at least moderate but appears progressed since 2012. CTA HEAD Posterior circulation: The right PICA and distal right vertebral artery remain patent and appear stable since 2012 with mild to moderate atherosclerotic irregularity but only mild associated stenosis to the vertebrobasilar junction. The left vertebral artery V4 segment appears patent but is probably supplied in a retrograde fashion from the vertebrobasilar junction to the patent left PICA origin. The left vertebral proximal to the PICA appears occluded. The basilar artery is patent without stenosis. The SCA and PCA origins are patent and stable. Posterior communicating arteries are diminutive or absent. There is mild irregularity of the left PCA P1 and P2 segments. The bilateral PCA branches are otherwise within normal limits. Anterior circulation: Both ICA  siphons are patent. Bilateral siphon calcified plaque appears stable since 2012. Mild siphon stenosis on the left. Mild to moderate stenosis on the right at the distal cavernous segment and supraclinoid segment. The MCA and ACA origins are patent and stable with mild irregularity. Anterior communicating artery and bilateral ACA branches are within normal limits. Left MCA M1 segment, bifurcation, and left MCA branches are stable and within normal limits. The right MCA M1 segment is tortuous and bifurcates early. The right MCA branches demonstrate increased mild irregularity but appear patent. No branch occlusion identified. Venous sinuses: Patent. Anatomic variants: None. Review of the MIP images confirms the above findings IMPRESSION: 1. 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 The distal left V4 segment and left PICA are patent and probably supplied in a retrograde fashion from the vertebrobasilar junction. 2. Progressed aortic arch and great vessel atherosclerosis since 2012 but no other hemodynamically significant stenosis in the neck. There is only minimal cervical ICA atherosclerosis. 3. Intracranial atherosclerosis has mildly progressed since 2012 with mild to moderate right ICA siphon stenosis. No other significant intracranial stenosis, and no intracranial arterial occlusion. 4. Chronic widespread cervical spine fusion. 5. Emphysema (ICD10-J43.9) and Aortic Atherosclerosis (ICD10-I70.0). Electronically Signed: By: Herminio Heads.D.  On: 06/27/2017 12:30   Ct Head Code Stroke Wo Contrast`  Addendum Date: 06/27/2017   ADDENDUM REPORT: 06/27/2017 11:59 ADDENDUM: These results were called by telephone at the time of interpretation on 06/27/2017 at 11:58 am to Dr. Cheral Marker, who verbally acknowledged these results. Electronically  Signed   By: Franchot Gallo M.D.   On: 06/27/2017 11:59   Result Date: 06/27/2017 CLINICAL DATA:  Code stroke.  Left-sided weakness EXAM: CT HEAD WITHOUT CONTRAST TECHNIQUE: Contiguous axial images were obtained from the base of the skull through the vertex without intravenous contrast. COMPARISON:  CT head 09/23/2010 FINDINGS: Brain: No evidence of acute infarction, hemorrhage, hydrocephalus, extra-axial collection or mass lesion/mass effect. Vascular: Negative for hyperdense vessel Skull: Negative Sinuses/Orbits: Negative Other: None ASPECTS ([place] Stroke Program Early CT Score) - Ganglionic level infarction (caudate, lentiform nuclei, internal capsule, insula, M1-M3 cortex): 7 - Supraganglionic infarction (M4-M6 cortex): 3 Total score (0-10 with 10 being normal): 10 IMPRESSION: 1. Normal CT head 2. ASPECTS is 10 Electronically Signed: By: Franchot Gallo M.D. On: 06/27/2017 11:46   Pertinent labs & imaging results that were available during my care of the patient were reviewed by me and considered in my medical decision making (see chart for details).  Medications Ordered in ED Medications  iopamidol (ISOVUE-370) 76 % injection (has no administration in time range)  iopamidol (ISOVUE-370) 76 % injection 50 mL (50 mLs Intravenous Contrast Given 06/27/17 1157)                                                                                                                                    Procedures Procedures  (including critical care time)  Medical Decision Making / ED Course I have reviewed the nursing notes for this encounter and the patient's prior records (if available in EHR or on provided paperwork).    Patient initially evaluated in triage by myself.  Code stroke initiated based on the history above.  CT without evidence of ICH.  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  Neurology was  consulted who evaluated the patient as a code stroke.  They deemed him not to be a candidate for TPA.  Rest of the work-up is grossly reassuring without significant electrolyte derangements or renal insufficiency.  No anemia.  No evidence of infectious process.  Will discuss case with medicine for admission continue work-up and management.  Final Clinical Impression(s) / ED Diagnoses Final diagnoses:  Left-sided weakness      This chart was dictated using voice recognition software.  Despite best efforts to proofread,  errors can occur which can change the documentation meaning.     Fatima Blank, MD 06/27/17 2035

## 2017-06-27 NOTE — ED Notes (Signed)
Pt requesting nicotine patch.  Admitting RN to page night shift MD.

## 2017-06-27 NOTE — Code Documentation (Signed)
72 year old male coming from home with complaints of left leg cramping and left arm weakness that started this morning. Pt reports that he woke up this morning at 0600 and he had to shuffle when he got out of bed. He had pain in his left lower calf. After time, he noted his left arm being weak as well. Upon arrival to the ED, Code Stroke was called in triage. Stroke Team met patient in CT. NIHSS 3 noted to have left arm weakness with no drift, left leg drift, some slight dysarthria, and decreased sensation on the left face. CTA completed. No tPA because patient is outside of the window and not an IR candidate. Handoff given to Santiago Glad, Therapist, sports.

## 2017-06-27 NOTE — ED Notes (Signed)
Neuro stroke team at bedside in CT

## 2017-06-27 NOTE — Consult Note (Signed)
Requesting Physician: Dr. Leonette Monarch    Chief Complaint: Left sided weakeness  History obtained from:  Patient and Chart  HPI:                                                                                                                                         Richard [name] is an 72 y.o. male with History of HTN, DM, smoking and CAD presents to ED with left sided weakness. Left leg cramping at 0600 this morning after sleeping. Then leg then began to feel weak with involvement of the LUE as well.  CTA Head and neck ordered, CBC with diff, CMP, BG (190), APTT, INR all ordered.  5 years ago he had onset of blurred vision and was evaluated for a stroke, with MRI negative at that time . CTA head and neck emergently performed: Mild narrowing of the cavernous segment of the internal carotid artery and the distal vertebral artery bilaterally.  Date last known well: Date: 06/27/2017 Time last known well: Time: 0600  tPA Given: No: Out of time window.  Modified Rankin: 0-1  Past Medical History:  Diagnosis Date  . Diabetes mellitus   . HLD (hyperlipidemia)   . Hypertension   . MI (myocardial infarction) ([facility])    cardiac stents x2  . Pneumonia     Past Surgical History:  Procedure Laterality Date  . BALLOON ANGIOPLASTY, ARTERY    . CARDIAC ELECTROPHYSIOLOGY MAPPING AND ABLATION    . CARPAL TUNNEL RELEASE Bilateral   . cervical neck fusion     x 4  . CORONARY STENT PLACEMENT     x 2  . ROTATOR CUFF REPAIR Right    x 4  . TIBIA FRACTURE SURGERY Left     Family History  Problem Relation Age of Onset  . Diabetes Mother   . Heart disease Mother   . Heart disease Brother   . Emphysema Father   . Lung cancer Brother        x 2  . Colon cancer Neg Hx   . Esophageal cancer Neg Hx   . Rectal cancer Neg Hx   . Stomach cancer Neg Hx   . Prostate cancer Neg Hx          Social History:  reports that he has been smoking cigarettes.  He has been smoking about 1.00 pack per day. He has  never used smokeless tobacco. He reports that he does not drink alcohol or use drugs.  Allergies: No Known Allergies  Medications:  Current Facility-Administered Medications  Medication Dose Route Frequency Provider Last Rate Last Dose  . iopamidol (ISOVUE-370) 76 % injection           . iopamidol (ISOVUE-370) 76 % injection            Current Outpatient Medications  Medication Sig Dispense Refill  . acetaminophen (TYLENOL) 325 MG tablet Take 325 mg by mouth every 6 (six) hours as needed for fever.    [name] aspirin 81 MG tablet Take 81 mg by mouth daily.    [name] glipiZIDE (GLUCOTROL) 10 MG tablet Take 10 mg by mouth 2 (two) times daily before a meal.    . glucose blood (ONE TOUCH ULTRA TEST) test strip 1 each.    [name] lisinopril (PRINIVIL,ZESTRIL) 10 MG tablet Take 10 mg by mouth daily.    . metFORMIN (GLUCOPHAGE) 1000 MG tablet Take 1,000 mg by mouth 2 (two) times daily with a meal.    . metoprolol (LOPRESSOR) 100 MG tablet Take 100 mg by mouth daily.    . OxyCODONE HCl, Abuse Deter, (OXAYDO) 5 MG TABA Take by mouth.      ROS:                                                                                                                                       Detailed ROS deferred due to acuity of presentation.   General Examination:                                                                                                      Blood pressure (!) 176/89, pulse 61, temperature 97.8 F (36.6 C), temperature source Oral, resp. rate 16, SpO2 98 %.  HEENT: [place]/AT Lungs: Respirations unlabored Ext: No edema  Mental Status: Alert, fully oriented, thought content appropriate.  Speech fluent without evidence of aphasia.  Able to follow 2-step directional command without difficulty. Cranial Nerves: II:  Visual fields intact without extinction to DSS. PERRL.  III,IV, VI: No  ptosis. EOMI. No nystagmus.  V,VII: Smile symmetric, facial temp sensation decreased on the left VIII: hearing intact to conversation IX,X: Palate rises symmetrically XI: Symmetric XII: midline tongue extension  Motor: RUE and RLE: 5/5 LUE 4/5 proximal and distal LLE 4/5 proximal and distal. Crampy muscle pain elicited when testing motor strength. Tone and bulk are normal.  Sensory: Decreased temp sensation LUE. Absent temp sensation bilateral distal lower extremities. FT intact with no extinction to DSS.  Deep Tendon Reflexes:  1+ biceps  and brachioradialis bilaterally.  Absent patellar and achilles reflexes bilaterally.  Plantars: Right: downgoing   Left: downgoing Cerebellar: No ataxia with FNF bilaterally Gait: Deferred due to acuity of presentation.   Lab Results: Basic Metabolic Panel: Recent Labs  Lab 06/27/17 1139  NA 138  K 4.7  CL 102  GLUCOSE 190*  BUN 21*  CREATININE 1.10    CBC: Recent Labs  Lab 06/27/17 1139  HGB 15.3  HCT 45.0    Lipid Panel: No results for input(s): CHOL, TRIG, HDL, CHOLHDL, VLDL, LDLCALC in the last 168 hours.  CBG: No results for input(s): GLUCAP in the last 168 hours.  Imaging: Ct Head Code Stroke Wo Contrast`  Result Date: 06/27/2017 CLINICAL DATA:  Code stroke.  Left-sided weakness EXAM: CT HEAD WITHOUT CONTRAST TECHNIQUE: Contiguous axial images were obtained from the base of the skull through the vertex without intravenous contrast. COMPARISON:  CT head 09/23/2010 FINDINGS: Brain: No evidence of acute infarction, hemorrhage, hydrocephalus, extra-axial collection or mass lesion/mass effect. Vascular: Negative for hyperdense vessel Skull: Negative Sinuses/Orbits: Negative Other: None ASPECTS ([place] Stroke Program Early CT Score) - Ganglionic level infarction (caudate, lentiform nuclei, internal capsule, insula, M1-M3 cortex): 7 - Supraganglionic infarction (M4-M6 cortex): 3 Total score (0-10 with 10 being normal): 10 IMPRESSION:  1. Normal CT head 2. ASPECTS is 10 Electronically Signed   By: Franchot Gallo M.D.   On: 06/27/2017 11:46   CTA head/neck:  1. 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 The distal left V4 segment and left PICA are patent and probably supplied in a retrograde fashion from the vertebrobasilar junction. 2. Progressed aortic arch and great vessel atherosclerosis since 2012 but no other hemodynamically significant stenosis in the neck. There is only minimal cervical ICA atherosclerosis. 3. Intracranial atherosclerosis has mildly progressed since 2012 with mild to moderate right ICA siphon stenosis. No other significant intracranial stenosis, and no intracranial arterial occlusion. 4. Chronic widespread cervical spine fusion.   Assessment: 72 y.o. male presenting with acute onset of left leg cramping with LUE and LLE weakness 1. 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 CT head without acute abnormality.  3. 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 The distal left V4 segment and left PICA are patent and probably supplied in a retrograde fashion from the vertebrobasilar junction. 4. Stroke Risk Factors - diabetes mellitus and hypertension, CAD  Recommend --HgbA1c, fasting lipid panel --MRI of the brain without contrast --PT consult, OT consult, Speech consult --Echocardiogram --Start 40 mg qd of Atorvastatin. Obtain baseline CK level.  --Classifiable as having falied ASA monotherapy. Has CAD, therefore would add  Plavix to ASA.  --Telemetry monitoring --Frequent neuro checks --NPO until passes stroke swallow screen --Permissive HTN x 24 hours --please page stroke NP  Or  PA  Or MD from 8am -4 pm  as this patient from this time will be  followed by the stroke.   You can look them up on [email or web address]  Password [email or web address]  Electronically signed: Dr. Kerney Elbe

## 2017-06-28 ENCOUNTER — Observation Stay (HOSPITAL_COMMUNITY): Payer: Medicare Other

## 2017-06-28 ENCOUNTER — Other Ambulatory Visit (HOSPITAL_COMMUNITY): Payer: Medicare Other

## 2017-06-28 ENCOUNTER — Observation Stay (HOSPITAL_BASED_OUTPATIENT_CLINIC_OR_DEPARTMENT_OTHER): Payer: Medicare Other

## 2017-06-28 ENCOUNTER — Other Ambulatory Visit: Payer: Self-pay

## 2017-06-28 DIAGNOSIS — I25119 Atherosclerotic heart disease of native coronary artery with unspecified angina pectoris: Secondary | ICD-10-CM

## 2017-06-28 DIAGNOSIS — I10 Essential (primary) hypertension: Secondary | ICD-10-CM

## 2017-06-28 DIAGNOSIS — I4892 Unspecified atrial flutter: Secondary | ICD-10-CM

## 2017-06-28 DIAGNOSIS — F17200 Nicotine dependence, unspecified, uncomplicated: Secondary | ICD-10-CM

## 2017-06-28 DIAGNOSIS — E785 Hyperlipidemia, unspecified: Secondary | ICD-10-CM

## 2017-06-28 DIAGNOSIS — G459 Transient cerebral ischemic attack, unspecified: Secondary | ICD-10-CM | POA: Diagnosis not present

## 2017-06-28 DIAGNOSIS — J439 Emphysema, unspecified: Secondary | ICD-10-CM

## 2017-06-28 DIAGNOSIS — E118 Type 2 diabetes mellitus with unspecified complications: Secondary | ICD-10-CM | POA: Diagnosis not present

## 2017-06-28 DIAGNOSIS — E1159 Type 2 diabetes mellitus with other circulatory complications: Secondary | ICD-10-CM | POA: Diagnosis not present

## 2017-06-28 LAB — LIPID PANEL
CHOL/HDL RATIO: 6.7 ratio
Cholesterol: 262 mg/dL — ABNORMAL HIGH (ref 0–200)
HDL: 39 mg/dL — AB (ref >40)
LDL CALC: 191 mg/dL — AB (ref 0–99)
Triglycerides: 159 mg/dL — ABNORMAL HIGH (ref <150)
VLDL: 32 mg/dL (ref 0–40)

## 2017-06-28 LAB — RAPID URINE DRUG SCREEN, HOSP PERFORMED
Amphetamines: NOT DETECTED
BENZODIAZEPINES: NOT DETECTED
Barbiturates: NOT DETECTED
COCAINE: NOT DETECTED
OPIATES: NOT DETECTED
Tetrahydrocannabinol: NOT DETECTED

## 2017-06-28 LAB — CK: CK TOTAL: 115 U/L (ref 49–397)

## 2017-06-28 LAB — GLUCOSE, CAPILLARY
GLUCOSE-CAPILLARY: 184 mg/dL — AB (ref 65–99)
GLUCOSE-CAPILLARY: 232 mg/dL — AB (ref 65–99)
Glucose-Capillary: 208 mg/dL — ABNORMAL HIGH (ref 65–99)
Glucose-Capillary: 167 mg/dL — ABNORMAL HIGH (ref 65–99)

## 2017-06-28 LAB — ECHOCARDIOGRAM COMPLETE
HEIGHTINCHES: 72 in
WEIGHTICAEL: 3058.22 [oz_av]

## 2017-06-28 MED ORDER — ASPIRIN EC 81 MG PO TBEC
81.0000 mg | DELAYED_RELEASE_TABLET | Freq: Every day | ORAL | Status: DC
  Administered 2017-06-29: 81 mg via ORAL
  Filled 2017-06-28: qty 1

## 2017-06-28 MED ORDER — FLUTICASONE PROPIONATE 50 MCG/ACT NA SUSP
2.0000 | Freq: Every day | NASAL | Status: DC
  Administered 2017-06-28 – 2017-06-29 (×2): 2 via NASAL
  Filled 2017-06-28 (×2): qty 16

## 2017-06-28 MED ORDER — LABETALOL HCL 5 MG/ML IV SOLN: 10.0000 mg | INTRAVENOUS | Status: DC | PRN

## 2017-06-28 MED ORDER — APIXABAN 5 MG PO TABS
5.0000 mg | ORAL_TABLET | Freq: Two times a day (BID) | ORAL | Status: DC
  Administered 2017-06-28 – 2017-06-29 (×2): 5 mg via ORAL
  Filled 2017-06-28 (×2): qty 1

## 2017-06-28 NOTE — Progress Notes (Signed)
SLP Cancellation Note  Patient Details Name: Richard [name] MRN: [id number] DOB: 1945/05/21   Cancelled treatment:       Reason Eval/Treat Not Completed: SLP screened, no needs identified, will sign off   Edna Grover 06/28/2017, 2:39 PM

## 2017-06-28 NOTE — Progress Notes (Addendum)
PROGRESS NOTE    Richard [name]  HCW:[id number] DOB: [birth year] DOA: 06/27/2017 PCP: Dione Housekeeper, MD   Outpatient Specialists:     Brief Narrative:  Richard [name] is a 72 y.o. male with medical history significant of CAD; HTN; HLD; and DM presenting with neurologic symptoms.   First, he noticed left-sided leg cramping.  About an hour later, he felt like his left arm "gave up and didn't want to move".  Then his face and lips started tingling on both sides.  Symptoms appear to have eased up, but he is not sure why.  The leg still cramps if he pulls his toes in.  He had an appointment this AM for a diabetes check up; he told the doctor this AM and she sent him to the ER.  He has intermittent "sinking spells" - it's like he loses his breath and gets real lightheaded.  It lasts a few seconds and then goes away and his head feels hot.  No changes in recet acitivites.  He has been feeling at baseline but did complain of left leg cramping recently.       Assessment & Plan:   Principal Problem:   TIA (transient ischemic attack) Active Problems:   Hypertension   Diabetes ([place])   CAD (coronary artery disease)   TIA -Symptoms are somewhat concerning for TIA/CVA -multiple risk factors -MRI negative -Echo pending -anticoagulation per -PT/OT/ST/Nutrition Consults  HTN -Allow permissive HTN -Treat BP only if >220/120, and then with goal of 15% reduction -Hold Lopressor and plan to restart in 48-72 hours  HLD -LDL >70 - Lipitor 40 mg daily empirically even though did not tolerate in the past -added CK per neuro consult  DM type 2 - uncontrolled - A1c: >11 -on Glucophage and Glucotrol -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  Tobacco abuse -encourage cessation -patch  Apparent emphysema -outpatient PFTs -tobacco cessation   DVT prophylaxis:  Lovenox   Code Status: Full Code   Family Communication:   Disposition Plan:     Consultants:    neuro   Subjective: 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 so does not want injectable insulin  Objective: Vitals:   06/28/17 0031 06/28/17 0536 06/28/17 0902 06/28/17 1124  BP: (!) 164/85 (!) 173/92 (!) 155/83 (!) 183/84  Pulse: 65 69 67 71  Resp: 18 18 20 20   Temp: 98.1 F (36.7 C) 98.1 F (36.7 C) 97.7 F (36.5 C) 97.8 F (36.6 C)  TempSrc: Oral Oral Oral Oral  SpO2: 98% 98% 95% 97%  Weight:      Height:        Intake/Output Summary (Last 24 hours) at 06/28/2017 1414 Last data filed at 06/28/2017 1304 Gross per 24 hour  Intake 710 ml  Output -  Net 710 ml   Filed Weights   06/27/17 2111  Weight: 86.7 kg (191 lb 2.2 oz)    Examination:  General exam: Appears calm and comfortable  Respiratory system: Clear to auscultation- prolonged expiratory phase Cardiovascular system: S1 & S2 heard, RRR. No JVD, murmurs, rubs, gallops or clicks. No pedal edema. Gastrointestinal system: Abdomen is nondistended, soft and nontender. No organomegaly or masses felt. Normal bowel sounds heard. Central nervous system: Alert and oriented. No focal neurological deficits. Extremities: Symmetric 5 x 5 power. Skin: No rashes, lesions or ulcers Psychiatry: Judgement and insight appear normal. Mood & affect appropriate.     Data Reviewed: I have personally reviewed following labs and imaging studies  CBC: Recent  Labs  Lab 06/27/17 1128 06/27/17 1139  WBC 8.1  --   NEUTROABS 4.8  --   HGB 14.7 15.3  HCT 44.6 45.0  MCV 86.3  --   PLT 190  --    Basic Metabolic Panel: Recent Labs  Lab 06/27/17 1128 06/27/17 1139  NA 136 138  K 4.6 4.7  CL 101 102  CO2 26  --   GLUCOSE 184* 190*  BUN 18 21*  CREATININE 1.15 1.10  CALCIUM 9.4  --    GFR: Estimated Creatinine Clearance: 67.6 mL/min (by C-G formula based on SCr of 1.1 mg/dL). Liver Function Tests: Recent Labs  Lab 06/27/17 1128  AST 21  ALT 20  ALKPHOS 82  BILITOT 0.6  PROT 7.2  ALBUMIN 3.9   No results for  input(s): LIPASE, AMYLASE in the last 168 hours. No results for input(s): AMMONIA in the last 168 hours. Coagulation Profile: Recent Labs  Lab 06/27/17 1128  INR 0.96   Cardiac Enzymes: Recent Labs  Lab 06/28/17 0723  CKTOTAL 115   BNP (last 3 results) No results for input(s): PROBNP in the last 8760 hours. HbA1C: Recent Labs    06/27/17 1954  HGBA1C 11.5*   CBG: Recent Labs  Lab 06/27/17 2220 06/28/17 0627 06/28/17 1120  GLUCAP 333* 184* 232*   Lipid Profile: Recent Labs    06/28/17 0723  CHOL 262*  HDL 39*  LDLCALC 191*  TRIG 159*  CHOLHDL 6.7   Thyroid Function Tests: Recent Labs    06/27/17 1954  TSH 3.672   Anemia Panel: No results for input(s): VITAMINB12, FOLATE, FERRITIN, TIBC, IRON, RETICCTPCT in the last 72 hours. Urine analysis:    Component Value Date/Time   COLORURINE YELLOW 01/04/2007 1000   APPEARANCEUR CLEAR 01/04/2007 1000   LABSPEC 1.022 01/04/2007 1000   PHURINE 5.5 01/04/2007 1000   GLUCOSEU >1000 (A) 01/04/2007 1000   HGBUR NEGATIVE 01/04/2007 1000   BILIRUBINUR NEGATIVE 01/04/2007 1000   KETONESUR NEGATIVE 01/04/2007 1000   PROTEINUR NEGATIVE 01/04/2007 1000   UROBILINOGEN 0.2 01/04/2007 1000   NITRITE NEGATIVE 01/04/2007 1000   LEUKOCYTESUR NEGATIVE 01/04/2007 1000     )No results found for this or any previous visit (from the past 240 hour(s)).    Anti-infectives (From admission, onward)   None       Radiology Studies: Ct Angio Head W Or Wo Contrast  Addendum Date: 06/27/2017   ADDENDUM REPORT: 06/27/2017 12:42 ADDENDUM: Study discussed by telephone with Dr. Leonette Monarch in the ED on 06/27/2017 at 1233 hours. Electronically Signed   By: Genevie Ann M.D.   On: 06/27/2017 12:42   Result Date: 06/27/2017 CLINICAL DATA:  72 year old male code stroke. Last seen normal 0730 hours. Left side weakness. EXAM: CT ANGIOGRAPHY HEAD AND NECK TECHNIQUE: Multidetector CT imaging of the head and neck was performed using the standard protocol  during bolus administration of intravenous contrast. Multiplanar CT image reconstructions and MIPs were obtained to evaluate the vascular anatomy. Carotid stenosis measurements (when applicable) are obtained utilizing NASCET criteria, using the distal internal carotid diameter as the denominator. CONTRAST:  55mL ISOVUE-370 IOPAMIDOL (ISOVUE-370) INJECTION 76% COMPARISON:  Head CT without contrast 1139 hours today. CTA head and neck 09/23/2010. 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 ACDF hardware remains in place at C6-C7. Mild anterolisthesis at C7-T1 associated with moderate to severe facet degeneration is stable. No acute osseous abnormality identified. Upper chest: Centrilobular and paraseptal emphysema. No upper lung nodule. No superior  mediastinal lymphadenopathy. Other neck: Negative.  No neck mass or lymphadenopathy. Aortic arch: Soft and calcified aortic arch and great vessel origin atherosclerosis has progressed since 2012. Three vessel arch configuration. Right carotid system: No brachiocephalic or right CCA origin stenosis despite plaque. Negative right carotid bifurcation, cervical right ICA. Left carotid system: Progressed left CCA origin soft and calcified plaque since 2012 but less than 50 % stenosis with respect to the distal vessel results. Similar soft plaque along the medial left CCA at the level of the larynx without stenosis. However, the left carotid bifurcation remains widely patent with minimal plaque. No cervical left ICA stenosis. Vertebral arteries: No significant proximal right subclavian artery stenosis despite plaque. Soft and calcified plaque at the right vertebral artery origin without stenosis. The right vertebral artery is patent to the skull base without stenosis. The right V 2 segment is tortuous. Progressed proximal left subclavian artery soft and calcified plaque since 2012 but no hemodynamically significant  stenosis. However, the left vertebral artery today is poorly enhancing from its origin and throughout its course to the skull base. There is chronic origin stenosis which was at least moderate but appears progressed since 2012. CTA HEAD Posterior circulation: The right PICA and distal right vertebral artery remain patent and appear stable since 2012 with mild to moderate atherosclerotic irregularity but only mild associated stenosis to the vertebrobasilar junction. The left vertebral artery V4 segment appears patent but is probably supplied in a retrograde fashion from the vertebrobasilar junction to the patent left PICA origin. The left vertebral proximal to the PICA appears occluded. The basilar artery is patent without stenosis. The SCA and PCA origins are patent and stable. Posterior communicating arteries are diminutive or absent. There is mild irregularity of the left PCA P1 and P2 segments. The bilateral PCA branches are otherwise within normal limits. Anterior circulation: Both ICA siphons are patent. Bilateral siphon calcified plaque appears stable since 2012. Mild siphon stenosis on the left. Mild to moderate stenosis on the right at the distal cavernous segment and supraclinoid segment. The MCA and ACA origins are patent and stable with mild irregularity. Anterior communicating artery and bilateral ACA branches are within normal limits. Left MCA M1 segment, bifurcation, and left MCA branches are stable and within normal limits. The right MCA M1 segment is tortuous and bifurcates early. The right MCA branches demonstrate increased mild irregularity but appear patent. No branch occlusion identified. Venous sinuses: Patent. Anatomic variants: None. Review of the MIP images confirms the above findings IMPRESSION: 1. 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 The distal left V4 segment and left PICA are patent and probably supplied in a retrograde fashion from the vertebrobasilar junction. 2. Progressed aortic arch and great vessel atherosclerosis since 2012 but no other hemodynamically significant stenosis in the neck. There is only minimal cervical ICA atherosclerosis. 3. Intracranial atherosclerosis has mildly progressed since 2012 with mild to moderate right ICA siphon stenosis. No other significant intracranial stenosis, and no intracranial arterial occlusion. 4. Chronic widespread cervical spine fusion. 5. Emphysema (ICD10-J43.9) and Aortic Atherosclerosis (ICD10-I70.0). Electronically Signed: By: Genevie Ann M.D. On: 06/27/2017 12:30   Ct Angio Neck W Or Wo Contrast  Addendum Date: 06/27/2017   ADDENDUM REPORT: 06/27/2017 12:42 ADDENDUM: Study discussed by telephone with Dr. Leonette Monarch in the ED on 06/27/2017 at 1233 hours. Electronically Signed  By: Genevie Ann M.D.   On: 06/27/2017 12:42   Result Date: 06/27/2017 CLINICAL DATA:  72 year old male code stroke. Last seen normal 0730 hours. Left side weakness. EXAM: CT ANGIOGRAPHY HEAD AND NECK TECHNIQUE: Multidetector CT imaging of the head and neck was performed using the standard protocol during bolus administration of intravenous contrast. Multiplanar CT image reconstructions and MIPs were obtained to evaluate the vascular anatomy. Carotid stenosis measurements (when applicable) are obtained utilizing NASCET criteria, using the distal internal carotid diameter as the denominator. CONTRAST:  46mL ISOVUE-370 IOPAMIDOL (ISOVUE-370) INJECTION 76% COMPARISON:  Head CT without contrast 1139 hours today. CTA head and neck 09/23/2010. 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 ACDF hardware remains in place at C6-C7. Mild anterolisthesis at C7-T1 associated with moderate to severe facet degeneration is stable. No acute osseous  abnormality identified. Upper chest: Centrilobular and paraseptal emphysema. No upper lung nodule. No superior mediastinal lymphadenopathy. Other neck: Negative.  No neck mass or lymphadenopathy. Aortic arch: Soft and calcified aortic arch and great vessel origin atherosclerosis has progressed since 2012. Three vessel arch configuration. Right carotid system: No brachiocephalic or right CCA origin stenosis despite plaque. Negative right carotid bifurcation, cervical right ICA. Left carotid system: Progressed left CCA origin soft and calcified plaque since 2012 but less than 50 % stenosis with respect to the distal vessel results. Similar soft plaque along the medial left CCA at the level of the larynx without stenosis. However, the left carotid bifurcation remains widely patent with minimal plaque. No cervical left ICA stenosis. Vertebral arteries: No significant proximal right subclavian artery stenosis despite plaque. Soft and calcified plaque at the right vertebral artery origin without stenosis. The right vertebral artery is patent to the skull base without stenosis. The right V 2 segment is tortuous. Progressed proximal left subclavian artery soft and calcified plaque since 2012 but no hemodynamically significant stenosis. However, the left vertebral artery today is poorly enhancing from its origin and throughout its course to the skull base. There is chronic origin stenosis which was at least moderate but appears progressed since 2012. CTA HEAD Posterior circulation: The right PICA and distal right vertebral artery remain patent and appear stable since 2012 with mild to moderate atherosclerotic irregularity but only mild associated stenosis to the vertebrobasilar junction. The left vertebral artery V4 segment appears patent but is probably supplied in a retrograde fashion from the vertebrobasilar junction to the patent left PICA origin. The left vertebral proximal to the PICA appears occluded. The basilar  artery is patent without stenosis. The SCA and PCA origins are patent and stable. Posterior communicating arteries are diminutive or absent. There is mild irregularity of the left PCA P1 and P2 segments. The bilateral PCA branches are otherwise within normal limits. Anterior circulation: Both ICA siphons are patent. Bilateral siphon calcified plaque appears stable since 2012. Mild siphon stenosis on the left. Mild to moderate stenosis on the right at the distal cavernous segment and supraclinoid segment. The MCA and ACA origins are patent and stable with mild irregularity. Anterior communicating artery and bilateral ACA branches are within normal limits. Left MCA M1 segment, bifurcation, and left MCA branches are stable and within normal limits. The right MCA M1 segment is tortuous and bifurcates early. The right MCA branches demonstrate increased mild irregularity but appear patent. No branch occlusion identified. Venous sinuses: Patent. Anatomic variants: None. Review of the MIP images confirms the above findings IMPRESSION: 1. Occlusion or subtotal occlusion of the Left Vertebral  Artery in the neck and proximal V4 segment is new since the 2012 CTA, but probably related to progressive atherosclerosis and stenosis rather than acute thromboembolic disease. The distal left V4 segment and left PICA are patent and probably supplied in a retrograde fashion from the vertebrobasilar junction. 2. Progressed aortic arch and great vessel atherosclerosis since 2012 but no other hemodynamically significant stenosis in the neck. There is only minimal cervical ICA atherosclerosis. 3. Intracranial atherosclerosis has mildly progressed since 2012 with mild to moderate right ICA siphon stenosis. No other significant intracranial stenosis, and no intracranial arterial occlusion. 4. Chronic widespread cervical spine fusion. 5. Emphysema (ICD10-J43.9) and Aortic Atherosclerosis (ICD10-I70.0). Electronically Signed: By: Genevie Ann M.D.  On: 06/27/2017 12:30   Mr Brain Wo Contrast  Result Date: 06/27/2017 CLINICAL DATA:  Initial evaluation for left-sided weakness, paresthesias. EXAM: MRI HEAD WITHOUT CONTRAST TECHNIQUE: Multiplanar, multiecho pulse sequences of the brain and surrounding structures were obtained without intravenous contrast. COMPARISON:  Prior CTA from earlier the same day. FINDINGS: Brain: Cerebral volume within normal limits for age. No significant cerebral white matter disease for age. There is a single ovoid 9 mm focus of T2/FLAIR signal abnormality within the left paramedian pons (series 8001, image 9). No intrinsic diffusion abnormality. Finding is of uncertain significance, and could be related chronic microvascular ischemic change or possible prior infectious or inflammatory process. In underlying low-grade lesions/mass is not entirely excluded. No other mass lesion. No midline shift or mass effect. No hydrocephalus. No extra-axial fluid collection. No evidence for acute or subacute infarct. Gray-white matter differentiation maintained. No other evidence for chronic infarction. No evidence for acute or chronic intracranial hemorrhage. Major dural sinuses grossly patent. Pituitary gland and suprasellar region normal. Midline structures intact and normal. Vascular: Irregular flow void noted within the diminutive left V4 segment, likely due to atherosclerotic changes seen on prior CTA. Major intracranial vascular flow voids otherwise maintained. Skull and upper cervical spine: Degenerative thickening noted at the tectorial membrane. Craniocervical junction otherwise unremarkable. Bone marrow signal intensity normal. No scalp soft tissue abnormality. Sinuses/Orbits: Globes and orbital soft tissues within normal limits. Mild scattered mucosal thickening throughout the ethmoidal air cells and maxillary sinuses. Superimposed small air-fluid level noted within the left maxillary sinus, consistent with acute sinusitis. No mastoid  effusion. Inner ear structures within normal limits. Other: None. IMPRESSION: 1. No acute intracranial abnormality. 2. 9 mm ovoid focus of T2/FLAIR hyperintensity within the left paramedian pons. Finding is of uncertain etiology or significance, and may be related to chronic small vessel ischemic change or a previous infectious or inflammatory process. A low-grade glioma could also conceivably have this appearance. As a result, a follow-up MRI, with and without contrast, in 3-6 months is suggested for further evaluation. 3. Otherwise normal brain MRI. 4. Mild left maxillary sinusitis. Electronically Signed   By: Jeannine Boga M.D.   On: 06/27/2017 21:16   Ct Head Code Stroke Wo Contrast`  Addendum Date: 06/27/2017   ADDENDUM REPORT: 06/27/2017 11:59 ADDENDUM: These results were called by telephone at the time of interpretation on 06/27/2017 at 11:58 am to Dr. Cheral Marker, who verbally acknowledged these results. Electronically Signed   By: Franchot Gallo M.D.   On: 06/27/2017 11:59   Result Date: 06/27/2017 CLINICAL DATA:  Code stroke.  Left-sided weakness EXAM: CT HEAD WITHOUT CONTRAST TECHNIQUE: Contiguous axial images were obtained from the base of the skull through the vertex without intravenous contrast. COMPARISON:  CT head 09/23/2010 FINDINGS: Brain: No evidence of acute infarction, hemorrhage,  hydrocephalus, extra-axial collection or mass lesion/mass effect. Vascular: Negative for hyperdense vessel Skull: Negative Sinuses/Orbits: Negative Other: None ASPECTS ([place] Stroke Program Early CT Score) - Ganglionic level infarction (caudate, lentiform nuclei, internal capsule, insula, M1-M3 cortex): 7 - Supraganglionic infarction (M4-M6 cortex): 3 Total score (0-10 with 10 being normal): 10 IMPRESSION: 1. Normal CT head 2. ASPECTS is 10 Electronically Signed: By: Franchot Gallo M.D. On: 06/27/2017 11:46        Scheduled Meds: .  stroke: mapping our early stages of recovery book   Does not apply Once   . atorvastatin  40 mg Oral q1800  . dipyridamole-aspirin  1 capsule Oral BID  . enoxaparin (LOVENOX) injection  40 mg Subcutaneous Q24H  . fluticasone  2 spray Each Nare Daily  . insulin aspart  0-15 Units Subcutaneous TID WC  . insulin aspart  0-5 Units Subcutaneous QHS  . nicotine  21 mg Transdermal Daily   Continuous Infusions:   LOS: 0 days    Time spent: 25 min   Geradine Girt, DO [facility] Pager [phone]  If 7PM-7AM, please contact night-coverage [email or web address] Password [email or web address] 06/28/2017, 2:14 PM

## 2017-06-28 NOTE — Progress Notes (Signed)
Patient has schedule Lipitor at 1800 and is wants to take even though he informed nurse earlier he feels like its causes his BLE aching. He wants to see if it happens again and if so would like to talk to MD about it tomorrow

## 2017-06-28 NOTE — Evaluation (Signed)
Physical Therapy Evaluation Patient Details Name: Richard [name] MRN: [id number] DOB: Nov 03, 1945 Today's Date: 06/28/2017   History of Present Illness  72 y.o. male with medical history significant of CAD; HTN; HLD; and DM presenting with neurologic symptoms, MRI shows no acute infarcts, further neuro work up underway.  Clinical Impression  Patient seen for mobility assessment after presenting with neurological changes. Mobilizing well. No significant impairments noted at this time. No further acute PT needs. Will sign off.    Follow Up Recommendations No PT follow up    Equipment Recommendations  None recommended by PT    Recommendations for Other Services       Precautions / Restrictions        Mobility  Bed Mobility Overal bed mobility: Independent             General bed mobility comments: HOB elevated  Transfers Overall transfer level: Independent Equipment used: None                Ambulation/Gait Ambulation/Gait assistance: Independent Ambulation Distance (Feet): 310 Feet Assistive device: None Gait Pattern/deviations: Step-through pattern;Narrow base of support   Gait velocity interpretation: >2.62 ft/sec, indicative of community ambulatory General Gait Details: steady with ambulation, no physical assist required at this time.  Stairs Stairs: Yes Stairs assistance: Modified independent (Device/Increase time) Stair Management: One rail Right Number of Stairs: 4 General stair comments: no difficulty  Wheelchair Mobility    Modified Rankin (Stroke Patients Only) Modified Rankin (Stroke Patients Only) Pre-Morbid Rankin Score: No symptoms Modified Rankin: No symptoms     Balance Overall balance assessment: Mild deficits observed, not formally tested   Sitting balance-Leahy Scale: Normal       Standing balance-Leahy Scale: Normal               High level balance activites: Side stepping;Backward walking;Direction  changes;Turns;Sudden stops;Head turns High Level Balance Comments: no difficulty with higher level balance tasks Standardized Balance Assessment Standardized Balance Assessment : Dynamic Gait Index   Dynamic Gait Index Level Surface: Normal Change in Gait Speed: Normal Gait with Horizontal Head Turns: Normal Gait with Vertical Head Turns: Normal Gait and Pivot Turn: Mild Impairment Step Over Obstacle: Mild Impairment Step Around Obstacles: Mild Impairment Steps: Mild Impairment Total Score: 20       Pertinent Vitals/Pain Pain Assessment: No/denies pain    Home Living Family/patient expects to be discharged to:: Private residence Living Arrangements: Spouse/significant other Available Help at Discharge: Family Type of Home: House Home Access: Stairs to enter Entrance Stairs-Rails: None Technical brewer of Steps: 1 Home Layout: One level Home Equipment: None      Prior Function Level of Independence: Independent               Hand Dominance   Dominant Hand: Right    Extremity/Trunk Assessment   Upper Extremity Assessment Upper Extremity Assessment: Defer to OT evaluation    Lower Extremity Assessment Lower Extremity Assessment: Overall WFL for tasks assessed       Communication   Communication: No difficulties  Cognition Arousal/Alertness: Awake/alert Behavior During Therapy: WFL for tasks assessed/performed Overall Cognitive Status: Within Functional Limits for tasks assessed                                        General Comments      Exercises     Assessment/Plan    PT Assessment Patent  does not need any further PT services  PT Problem List         PT Treatment Interventions      PT Goals (Current goals can be found in the Care Plan section)  Acute Rehab PT Goals PT Goal Formulation: All assessment and education complete, DC therapy    Frequency     Barriers to discharge        Co-evaluation                AM-PAC PT "6 Clicks" Daily Activity  Outcome Measure Difficulty turning over in bed (including adjusting bedclothes, sheets and blankets)?: None Difficulty moving from lying on back to sitting on the side of the bed? : None Difficulty sitting down on and standing up from a chair with arms (e.g., wheelchair, bedside commode, etc,.)?: None Help needed moving to and from a bed to chair (including a wheelchair)?: None Help needed walking in hospital room?: None Help needed climbing 3-5 steps with a railing? : A Little 6 Click Score: 23    End of Session Equipment Utilized During Treatment: Gait belt Activity Tolerance: Patient tolerated treatment well Patient left: in bed;with call bell/phone within reach(sitting EOB) Nurse Communication: Mobility status(Independent for mobility) PT Visit Diagnosis: Other symptoms and signs involving the nervous system (P49.826)    Time: 4158-3094 PT Time Calculation (min) (ACUTE ONLY): 17 min   Charges:   PT Evaluation $PT Eval Low Complexity: 1 Low     PT G Codes:        Alben Deeds, PT DPT  Board Certified Neurologic Specialist [phone]   Duncan Dull 06/28/2017, 8:23 AM

## 2017-06-28 NOTE — Progress Notes (Addendum)
Occupational Therapy Evaluation Patient Details Name: Richard [name] MRN: [id number] DOB: Apr 23, 1945 Today's Date: 06/28/2017    History of Present Illness 72 y.o. male with medical history significant of CAD; HTN; HLD; and DM presenting with neurologic symptoms, MRI shows no acute infarcts, further neuro work up underway.   Clinical Impression   PTA, pt independent with mobility and ADL and is a retired Administrator. Pt appears to be functioning at his baseline with mild weakness L shoulder, which is most likely orthopedic. Completed CVA education using [employer]. Pt verbalized understanding. Pt safe to DC home when medically stable. OT signing off.     Follow Up Recommendations  No OT follow up    Equipment Recommendations  None recommended by OT    Recommendations for Other Services       Precautions / Restrictions Precautions Precautions: None      Mobility Bed Mobility Overal bed mobility: Independent             General bed mobility comments: HOB elevated  Transfers Overall transfer level: Independent Equipment used: None                  Balance Overall balance assessment: Mild deficits observed, not formally tested   Sitting balance-Leahy Scale: Normal       Standing balance-Leahy Scale: Normal               High level balance activites: Side stepping;Backward walking;Direction changes;Turns;Sudden stops;Head turns High Level Balance Comments:  Pt states he "sometimes sways a little" at baseline      ADL either performed or assessed with clinical judgement   ADL Overall ADL's : At baseline    Pt is diabetic. Educated pt on importance of skin inspection, especially feet. Pt verbalized understanding.                                          Vision Baseline Vision/History: Wears glasses Wears Glasses: At all times Patient Visual Report: No change from baseline Vision Assessment?: Yes Eye Alignment: Within Functional  Limits Ocular Range of Motion: Within Functional Limits Alignment/Gaze Preference: Within Defined Limits Tracking/Visual Pursuits: Able to track stimulus in all quads without difficulty Saccades: Within functional limits Convergence: Within functional limits Visual Fields: No apparent deficits     Agricultural engineer Tested?: (WFL)   Praxis Praxis Praxis tested?: Within functional limits    Pertinent Vitals/Pain Pain Assessment: No/denies pain     Hand Dominance Right   Extremity/Trunk Assessment Upper Extremity Assessment Upper Extremity Assessment: LUE deficits/detail LUE Deficits / Details: give away weakness at shoulder; worse wtih B stimulation; could be related to previous orthopedic shoulder issue; functional   Lower Extremity Assessment Lower Extremity Assessment: Defer to PT evaluation   Cervical / Trunk Assessment Cervical / Trunk Assessment: Normal   Communication Communication Communication: No difficulties   Cognition Arousal/Alertness: Awake/alert Behavior During Therapy: WFL for tasks assessed/performed Overall Cognitive Status: Within Functional Limits for tasks assessed                                     General Comments       Exercises     Shoulder Instructions      Home Living Family/patient expects to be discharged to:: Private residence Living Arrangements: Spouse/significant other Available Help  at Discharge: Family Type of Home: House Home Access: Stairs to enter Technical brewer of Steps: 1 Entrance Stairs-Rails: None Home Layout: One level     Bathroom Shower/Tub: Teacher, early years/pre: Handicapped height Bathroom Accessibility: Yes How Accessible: Accessible via walker Home Equipment: None          Prior Functioning/Environment Level of Independence: Independent        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      OT  Treatment/Interventions:      OT Goals(Current goals can be found in the care plan section) Acute Rehab OT Goals Patient Stated Goal: to go home OT Goal Formulation: All assessment and education complete, DC therapy  OT Frequency:     Barriers to D/C:            Co-evaluation              AM-PAC PT "6 Clicks" Daily Activity     Outcome Measure Help from another person eating meals?: None Help from another person taking care of personal grooming?: None Help from another person toileting, which includes using toliet, bedpan, or urinal?: None Help from another person bathing (including washing, rinsing, drying)?: None Help from another person to put on and taking off regular upper body clothing?: None Help from another person to put on and taking off regular lower body clothing?: None 6 Click Score: 24   End of Session Nurse Communication: Mobility status  Activity Tolerance: Patient tolerated treatment well Patient left: in bed;with call bell/phone within reach  OT Visit Diagnosis: Muscle weakness (generalized) (M62.81)                Time: 7322-0254 OT Time Calculation (min): 14 min Charges:  OT General Charges $OT Visit: 1 Visit OT Evaluation $OT Eval Low Complexity: 1 Low G-Codes:     [place], OT/L  [phone] 06/28/2017  Wilson Dusenbery,HILLARY 06/28/2017, 8:50 AM

## 2017-06-28 NOTE — Progress Notes (Signed)
Patient informed staff that after talking Aggrenox this morning, he starting to having aching in BLE,  The aching is something that has experienced before when he taking Lipitor  And other medicines alike. Patient did not want any medication type of intervention. Patient just wanted MD to be made aware, and she was. MD will further assess.

## 2017-06-28 NOTE — Progress Notes (Signed)
BP = 202/98, No PRN BP med.. MD on call notified

## 2017-06-28 NOTE — Progress Notes (Addendum)
STROKE TEAM PROGRESS NOTE   INTERVAL HISTORY No family is at the bedside.  Patient states his left hemiparesis has totally returned to normal.  It started with a cramp in his left calf upon awakening yesterday morning.  He had perioral numbness that radiated to bilateral cheeks.  No numbness or tingling in the extremities.  His left cramp is much improved.  He has history of atrial flutter which was ablated by Dr. Caryl Comes 2007/2008.  He was on Coumadin for a short while, then changed to aspirin.  He has not seen a cardiologist in several years.  Of note he is intolerant to all statins and Plavix due to myalgias.  Patient states he has reported attempts to stop smoking in the past, but difficult due to reaction to Chantix.  He states he is ready to stop now given new diagnosis of emphysema.  Vitals:   06/27/17 2311 06/28/17 0031 06/28/17 0536 06/28/17 0902  BP: (!) 148/78 (!) 164/85 (!) 173/92 (!) 155/83  Pulse: 72 65 69 67  Resp: 18 18 18 20   Temp: 98.7 F (37.1 C) 98.1 F (36.7 C) 98.1 F (36.7 C) 97.7 F (36.5 C)  TempSrc: Oral Oral Oral Oral  SpO2: 96% 98% 98% 95%  Weight:      Height:        CBC:  CBC Latest Ref Rng & Units 06/27/2017 06/27/2017 11/16/2012  WBC 4.0 - 10.5 K/uL - 8.1 8.8  Hemoglobin 13.0 - 17.0 g/dL 15.3 14.7 12.5(L)  Hematocrit 39.0 - 52.0 % 45.0 44.6 36.5(L)  Platelets 150 - 400 K/uL - 190 218     Comprehensive Metabolic Panel:   CMP Latest Ref Rng & Units 06/27/2017 06/27/2017 11/16/2012  Glucose 65 - 99 mg/dL 190(H) 184(H) 182(H)  BUN 6 - 20 mg/dL 21(H) 18 22  Creatinine 0.61 - 1.24 mg/dL 1.10 1.15 0.82  Sodium 135 - 145 mmol/L 138 136 139  Potassium 3.5 - 5.1 mmol/L 4.7 4.6 3.8  Chloride 101 - 111 mmol/L 102 101 102  CO2 22 - 32 mmol/L - 26 24  Calcium 8.9 - 10.3 mg/dL - 9.4 8.8  Total Protein 6.5 - 8.1 g/dL - 7.2 -  Total Bilirubin 0.3 - 1.2 mg/dL - 0.6 -  Alkaline Phos 38 - 126 U/L - 82 -  AST 15 - 41 U/L - 21 -  ALT 17 - 63 U/L - 20 -   Lipid Panel:      Component Value Date/Time   CHOL 262 (H) 06/28/2017 0723   TRIG 159 (H) 06/28/2017 0723   HDL 39 (L) 06/28/2017 0723   CHOLHDL 6.7 06/28/2017 0723   VLDL 32 06/28/2017 0723   LDLCALC 191 (H) 06/28/2017 0723   HgbA1c:  Lab Results  Component Value Date   HGBA1C 11.5 (H) 06/27/2017   Urine Drug Screen: No results found for: LABOPIA, COCAINSCRNUR, LABBENZ, AMPHETMU, THCU, LABBARB  Alcohol Level No results found for: [place]  IMAGING Ct Angio Head W Or Wo Contrast  Addendum Date: 06/27/2017   ADDENDUM REPORT: 06/27/2017 12:42 ADDENDUM: Study discussed by telephone with Dr. Leonette Monarch in the ED on 06/27/2017 at 1233 hours. Electronically Signed   By: Genevie Ann M.D.   On: 06/27/2017 12:42   Result Date: 06/27/2017 CLINICAL DATA:  72 year old male code stroke. Last seen normal 0730 hours. Left side weakness. EXAM: CT ANGIOGRAPHY HEAD AND NECK TECHNIQUE: Multidetector CT imaging of the head and neck was performed using the standard protocol during bolus administration of intravenous contrast.  Multiplanar CT image reconstructions and MIPs were obtained to evaluate the vascular anatomy. Carotid stenosis measurements (when applicable) are obtained utilizing NASCET criteria, using the distal internal carotid diameter as the denominator. CONTRAST:  4mL ISOVUE-370 IOPAMIDOL (ISOVUE-370) INJECTION 76% COMPARISON:  Head CT without contrast 1139 hours today. CTA head and neck 09/23/2010. 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 ACDF hardware remains in place at C6-C7. Mild anterolisthesis at C7-T1 associated with moderate to severe facet degeneration is stable. No acute osseous abnormality identified. Upper chest: Centrilobular and paraseptal emphysema. No upper lung nodule. No superior mediastinal lymphadenopathy. Other neck: Negative.  No neck mass or lymphadenopathy. Aortic arch: Soft and calcified aortic arch and great vessel origin atherosclerosis has  progressed since 2012. Three vessel arch configuration. Right carotid system: No brachiocephalic or right CCA origin stenosis despite plaque. Negative right carotid bifurcation, cervical right ICA. Left carotid system: Progressed left CCA origin soft and calcified plaque since 2012 but less than 50 % stenosis with respect to the distal vessel results. Similar soft plaque along the medial left CCA at the level of the larynx without stenosis. However, the left carotid bifurcation remains widely patent with minimal plaque. No cervical left ICA stenosis. Vertebral arteries: No significant proximal right subclavian artery stenosis despite plaque. Soft and calcified plaque at the right vertebral artery origin without stenosis. The right vertebral artery is patent to the skull base without stenosis. The right V 2 segment is tortuous. Progressed proximal left subclavian artery soft and calcified plaque since 2012 but no hemodynamically significant stenosis. However, the left vertebral artery today is poorly enhancing from its origin and throughout its course to the skull base. There is chronic origin stenosis which was at least moderate but appears progressed since 2012. CTA HEAD Posterior circulation: The right PICA and distal right vertebral artery remain patent and appear stable since 2012 with mild to moderate atherosclerotic irregularity but only mild associated stenosis to the vertebrobasilar junction. The left vertebral artery V4 segment appears patent but is probably supplied in a retrograde fashion from the vertebrobasilar junction to the patent left PICA origin. The left vertebral proximal to the PICA appears occluded. The basilar artery is patent without stenosis. The SCA and PCA origins are patent and stable. Posterior communicating arteries are diminutive or absent. There is mild irregularity of the left PCA P1 and P2 segments. The bilateral PCA branches are otherwise within normal limits. Anterior circulation:  Both ICA siphons are patent. Bilateral siphon calcified plaque appears stable since 2012. Mild siphon stenosis on the left. Mild to moderate stenosis on the right at the distal cavernous segment and supraclinoid segment. The MCA and ACA origins are patent and stable with mild irregularity. Anterior communicating artery and bilateral ACA branches are within normal limits. Left MCA M1 segment, bifurcation, and left MCA branches are stable and within normal limits. The right MCA M1 segment is tortuous and bifurcates early. The right MCA branches demonstrate increased mild irregularity but appear patent. No branch occlusion identified. Venous sinuses: Patent. Anatomic variants: None. Review of the MIP images confirms the above findings IMPRESSION: 1. 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 The distal left V4 segment and left PICA are patent and probably supplied in a retrograde fashion from the vertebrobasilar junction. 2. Progressed aortic arch and great vessel atherosclerosis since  2012 but no other hemodynamically significant stenosis in the neck. There is only minimal cervical ICA atherosclerosis. 3. Intracranial atherosclerosis has mildly progressed since 2012 with mild to moderate right ICA siphon stenosis. No other significant intracranial stenosis, and no intracranial arterial occlusion. 4. Chronic widespread cervical spine fusion. 5. Emphysema (ICD10-J43.9) and Aortic Atherosclerosis (ICD10-I70.0). Electronically Signed: By: Genevie Ann M.D. On: 06/27/2017 12:30   Ct Angio Neck W Or Wo Contrast  Addendum Date: 06/27/2017   ADDENDUM REPORT: 06/27/2017 12:42 ADDENDUM: Study discussed by telephone with Dr. Leonette Monarch in the ED on 06/27/2017 at 1233 hours. Electronically Signed   By: Genevie Ann M.D.   On: 06/27/2017 12:42   Result  Date: 06/27/2017 CLINICAL DATA:  72 year old male code stroke. Last seen normal 0730 hours. Left side weakness. EXAM: CT ANGIOGRAPHY HEAD AND NECK TECHNIQUE: Multidetector CT imaging of the head and neck was performed using the standard protocol during bolus administration of intravenous contrast. Multiplanar CT image reconstructions and MIPs were obtained to evaluate the vascular anatomy. Carotid stenosis measurements (when applicable) are obtained utilizing NASCET criteria, using the distal internal carotid diameter as the denominator. CONTRAST:  40mL ISOVUE-370 IOPAMIDOL (ISOVUE-370) INJECTION 76% COMPARISON:  Head CT without contrast 1139 hours today. CTA head and neck 09/23/2010. 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 ACDF hardware remains in place at C6-C7. Mild anterolisthesis at C7-T1 associated with moderate to severe facet degeneration is stable. No acute osseous abnormality identified. Upper chest: Centrilobular and paraseptal emphysema. No upper lung nodule. No superior mediastinal lymphadenopathy. Other neck: Negative.  No neck mass or lymphadenopathy. Aortic arch: Soft and calcified aortic arch and great vessel origin atherosclerosis has progressed since 2012. Three vessel arch configuration. Right carotid system: No brachiocephalic or right CCA origin stenosis despite plaque. Negative right carotid bifurcation, cervical right ICA. Left carotid system: Progressed left CCA origin soft and calcified plaque since 2012 but less than 50 % stenosis with respect to the distal vessel results. Similar soft plaque along the medial left CCA at the level of the larynx without stenosis. However, the left carotid bifurcation remains widely patent with minimal plaque. No cervical left ICA stenosis. Vertebral arteries: No significant proximal right subclavian artery stenosis despite plaque. Soft and calcified plaque at the right vertebral artery origin  without stenosis. The right vertebral artery is patent to the skull base without stenosis. The right V 2 segment is tortuous. Progressed proximal left subclavian artery soft and calcified plaque since 2012 but no hemodynamically significant stenosis. However, the left vertebral artery today is poorly enhancing from its origin and throughout its course to the skull base. There is chronic origin stenosis which was at least moderate but appears progressed since 2012. CTA HEAD Posterior circulation: The right PICA and distal right vertebral artery remain patent and appear stable since 2012 with mild to moderate atherosclerotic irregularity but only mild associated stenosis to the vertebrobasilar junction. The left vertebral artery V4 segment appears patent but is probably supplied in a retrograde fashion from the vertebrobasilar junction to the patent left PICA origin. The left vertebral proximal to the PICA appears occluded. The basilar artery is patent without stenosis. The SCA and PCA origins are patent and stable. Posterior communicating arteries are diminutive or absent. There is mild irregularity of the left PCA P1 and P2 segments. The bilateral PCA branches are otherwise within normal limits. Anterior circulation: Both ICA siphons are patent. Bilateral siphon calcified plaque appears stable since 2012. Mild siphon stenosis on  the left. Mild to moderate stenosis on the right at the distal cavernous segment and supraclinoid segment. The MCA and ACA origins are patent and stable with mild irregularity. Anterior communicating artery and bilateral ACA branches are within normal limits. Left MCA M1 segment, bifurcation, and left MCA branches are stable and within normal limits. The right MCA M1 segment is tortuous and bifurcates early. The right MCA branches demonstrate increased mild irregularity but appear patent. No branch occlusion identified. Venous sinuses: Patent. Anatomic variants: None. Review of the MIP  images confirms the above findings IMPRESSION: 1. 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 The distal left V4 segment and left PICA are patent and probably supplied in a retrograde fashion from the vertebrobasilar junction. 2. Progressed aortic arch and great vessel atherosclerosis since 2012 but no other hemodynamically significant stenosis in the neck. There is only minimal cervical ICA atherosclerosis. 3. Intracranial atherosclerosis has mildly progressed since 2012 with mild to moderate right ICA siphon stenosis. No other significant intracranial stenosis, and no intracranial arterial occlusion. 4. Chronic widespread cervical spine fusion. 5. Emphysema (ICD10-J43.9) and Aortic Atherosclerosis (ICD10-I70.0). Electronically Signed: By: Genevie Ann M.D. On: 06/27/2017 12:30   Mr Brain Wo Contrast  Result Date: 06/27/2017 CLINICAL DATA:  Initial evaluation for left-sided weakness, paresthesias. EXAM: MRI HEAD WITHOUT CONTRAST TECHNIQUE: Multiplanar, multiecho pulse sequences of the brain and surrounding structures were obtained without intravenous contrast. COMPARISON:  Prior CTA from earlier the same day. FINDINGS: Brain: Cerebral volume within normal limits for age. No significant cerebral white matter disease for age. There is a single ovoid 9 mm focus of T2/FLAIR signal abnormality within the left paramedian pons (series 8001, image 9). No intrinsic diffusion abnormality. Finding is of uncertain significance, and could be related chronic microvascular ischemic change or possible prior infectious or inflammatory process. In underlying low-grade lesions/mass is not entirely excluded. No other mass lesion. No midline shift or mass effect. No hydrocephalus. No extra-axial fluid collection. No evidence for acute or subacute  infarct. Gray-white matter differentiation maintained. No other evidence for chronic infarction. No evidence for acute or chronic intracranial hemorrhage. Major dural sinuses grossly patent. Pituitary gland and suprasellar region normal. Midline structures intact and normal. Vascular: Irregular flow void noted within the diminutive left V4 segment, likely due to atherosclerotic changes seen on prior CTA. Major intracranial vascular flow voids otherwise maintained. Skull and upper cervical spine: Degenerative thickening noted at the tectorial membrane. Craniocervical junction otherwise unremarkable. Bone marrow signal intensity normal. No scalp soft tissue abnormality. Sinuses/Orbits: Globes and orbital soft tissues within normal limits. Mild scattered mucosal thickening throughout the ethmoidal air cells and maxillary sinuses. Superimposed small air-fluid level noted within the left maxillary sinus, consistent with acute sinusitis. No mastoid effusion. Inner ear structures within normal limits. Other: None. IMPRESSION: 1. No acute intracranial abnormality. 2. 9 mm ovoid focus of T2/FLAIR hyperintensity within the left paramedian pons. Finding is of uncertain etiology or significance, and may be related to chronic small vessel ischemic change or a previous infectious or inflammatory process. A low-grade glioma could also conceivably have this appearance. As a result, a follow-up MRI, with and without contrast, in 3-6 months is suggested for further evaluation. 3. Otherwise normal brain MRI. 4. Mild left maxillary sinusitis. Electronically Signed   By: Jeannine Boga M.D.   On: 06/27/2017 21:16  Ct Head Code Stroke Wo Contrast`  Addendum Date: 06/27/2017   ADDENDUM REPORT: 06/27/2017 11:59 ADDENDUM: These results were called by telephone at the time of interpretation on 06/27/2017 at 11:58 am to Dr. Cheral Marker, who verbally acknowledged these results. Electronically Signed   By: Franchot Gallo M.D.   On:  06/27/2017 11:59   Result Date: 06/27/2017 CLINICAL DATA:  Code stroke.  Left-sided weakness EXAM: CT HEAD WITHOUT CONTRAST TECHNIQUE: Contiguous axial images were obtained from the base of the skull through the vertex without intravenous contrast. COMPARISON:  CT head 09/23/2010 FINDINGS: Brain: No evidence of acute infarction, hemorrhage, hydrocephalus, extra-axial collection or mass lesion/mass effect. Vascular: Negative for hyperdense vessel Skull: Negative Sinuses/Orbits: Negative Other: None ASPECTS ([place] Stroke Program Early CT Score) - Ganglionic level infarction (caudate, lentiform nuclei, internal capsule, insula, M1-M3 cortex): 7 - Supraganglionic infarction (M4-M6 cortex): 3 Total score (0-10 with 10 being normal): 10 IMPRESSION: 1. Normal CT head 2. ASPECTS is 10 Electronically Signed: By: Franchot Gallo M.D. On: 06/27/2017 11:46   2D echocardiogram Pending  PHYSICAL EXAM HEENT: [place]/AT Lungs: Respirations unlabored.  Breath sounds clear to auscultation. Cardiac: Heart rate regular Ext: No edema  Mental Status: Alert, fully oriented, thought content appropriate.  Speech fluent without evidence of aphasia.  Able to follow 3-step commands without difficulty. Cranial Nerves: II:  Visual fields intact without extinction to DSS. PERRL.  III,IV, VI: No ptosis. EOMI. No nystagmus.  V,VII: Smile symmetric, sensation intact  VIII: hearing intact to conversation IX,X: Palate rises symmetrically XI: Symmetric XII: midline tongue extension  Motor: RUE and RLE: 5/5 LUE 4/5 proximal and 5/5 distal (previous surgery left upper arm due to muscle tear) LLE 5/5 proximal and distal.  Unable to elicit muscle cramp.  Tone and bulk are normal.  Sensory: sensation intact  Plantars: Right: downgoing                           Left: downgoing Cerebellar: No ataxia with FNF bilaterally, can stand on one foot bilaterally Gait: Steady  ASSESSMENT/PLAN [name] is a 72 y.o. male with  history of atrial flutter status post ablation, hypertension, hyperlipidemia, cardiac disease with stent placement, diabetes presenting with left lower extremity cramp with left hemiparesis and perioral numbness and tingling.   Right brain TIA  Resultant neurologic symptoms resolved  Code Stroke CT head No acute stroke. ASPECTS 10.     CTA head & neck occlusion LVA new since 2012 but not acute.  Progressed aortic arch and great vessel atherosclerosis since 2012.  No hemodynamically significant stenosis in the neck.  Only mild intracranial atherosclerosis progression.  Cervical spine fusion.  Emphysema.  MRI  negative   2D Echo  pending   LDL 191  HgbA1c 11.8  Lovenox 40 mg sq daily for VTE prophylaxis  aspirin 325 mg daily prior to admission, now on dipyridamole SR 250 mg/aspirin 25 mg twice a day. (History of myalgias on Plavix in the past).  Given history of atrial flutter, recommend DOAC  Therapy recommendations: No therapy needs  Disposition: Return home  Follow-up stroke clinic in 4 weeks.  Order placed.  Hx Atrial flutter   Ablation in 200/2008 by Dr. Caryl Comes   No documentation of atrial flutter in hospital . admitting EKG shows normal sinus rhythm.  Cardiac monitoring strips shows normal sinus rhythm.  Given history of atrial flutter in setting of right brain TIA, recommend DOAC for secondary stroke prevention. Patient  does not want to take warfarin.  Hypertension  Stable . Permissive hypertension (OK if < 220/120) but gradually normalize in 5-7 days . Long-term BP goal normotensive  Hyperlipidemia  Home meds: No statin.  Intolerant to multiple statins in the past (myalgias)  LDL 191, goal < 70  Recommend PSCK9 inhibitor.  Has to be ordered by outpatient physician.  Documentation of failed treatments required.  Diabetes type II  HgbA1c 11.5, goal < 7.0  Uncontrolled  Other Stroke Risk Factors  Advanced age  Cigarette smoker, advised to stop smoking.   States he stopped yesterday.  Coronary artery disease - MI, stents  Hospital day # 0  Burnetta Sabin, MSN, APRN, ANVP-BC, AGPCNP-BC Advanced Practice Stroke Nurse [place] for Schedule & Pager information 06/28/2017 3:31 PM   ATTENDING NOTE: I reviewed above note and agree with the assessment and plan. I have made any additions or clarifications directly to the above note. Pt was seen and examined.   72 year old male with history of diabetes, hypertension, hyperlipidemia, CAD/MI status post stent, smoker presented for episode of left-sided weakness, numbness in the perioral tingling.  Symptoms resolved within 12 hours.  Denies any headache or history of migraine headache.  He had similar episode in 09/2010 with perioral numbness, left upper extremity weakness and headache.  MRI negative.  CTA head neck at that time showed mild narrowing of bilateral siphons and distal VAs.   Currently patient symptom-free, CT head and neck showed left VA occlusion since last CTA 7 years ago.  Also showed increased intracranial atherosclerosis but no significant stenosis.  MRI negative for acute infarct, however there is left pontine abnormal T2/flair signal, recommend repeat MRI in 3 to 6 months.  EF 55 to 60%, improved from prior.  A1c 11.5, LDL 191.  Etiology for current episode not quite clear.  He had a similar episode 09/2010, but with headache.  This time no headache.  Both times MRI negative, and CTA head and neck no significant findings except this time more atherosclerosis and left VA occluded. Uncontrolled diabetes and hyperlipidemia as well as smoker her old stroke risk factors.  However, he also had a history of a flutter status post ablation, raising concern of cardioembolic event.  Given all risk factors, we recommend Eliquis 5mg  twice daily and aspirin 81  for stroke prevention.  Continue Lipitor if able to tolerate.  If still not able to tolerating statins, consider PCSK9  inhibitors for cholesterol control.  Neurology will sign off. Please call with questions. Pt will follow up with stroke clinic NP at [facility] in about 4 weeks. Thanks for the consult.   Rosalin Hawking, MD PhD Stroke Neurology 06/28/2017 6:36 PM   To contact Stroke Continuity provider, please refer to [email or web address] After hours, contact General Neurology

## 2017-06-28 NOTE — Progress Notes (Signed)
Inpatient Diabetes Program   AACE/ADA: New Consensus Statement on Inpatient Glycemic Control (2015)  Target Ranges:  Prepandial:   less than 140 mg/dL      Peak postprandial:   less than 180 mg/dL (1-2 hours)      Critically ill patients:  140 - 180 mg/dL   Spoke with patient about diabetes and home regimen for diabetes control. Patient reports that he is followed by his PCP for diabetes management and currently takes Glipizide and Metformin. Patient knows his A1c is over 11% and says its been that way for awhile. Discussed A1C results 11.5% this admission. Discussed glucose and A1C goals. Discussed importance of checking CBGs and maintaining good CBG control to prevent long-term and short-term complications. Explained how hyperglycemia leads to damage within blood vessels which lead to the common complications seen with uncontrolled diabetes. Stressed to the patient the importance of improving glycemic control to prevent further complications from uncontrolled diabetes.  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 Spoke with patient about the need for additional medication. Patient has questions about injectables that are incretin memetics. Patient will call insurance to see about more preferred newer DM medications.  Discussed impact of nutrition, exercise, stress, sickness, and medications on diabetes control. Discussed carbohydrates, carbohydrate goals per day and meal, along with portion sizes. Patient verbalized understanding of information discussed and he states that he has no further questions at this time related to diabetes.   Thanks,  Tama Headings RN, MSN, [facility] Inpatient Diabetes Coordinator Team Pager [phone] (8a-5p)

## 2017-06-28 NOTE — Progress Notes (Signed)
  Echocardiogram 2D Echocardiogram has been performed.  Richard [name] T [name] 06/28/2017, 3:37 PM

## 2017-06-29 DIAGNOSIS — I4892 Unspecified atrial flutter: Secondary | ICD-10-CM | POA: Diagnosis not present

## 2017-06-29 DIAGNOSIS — G459 Transient cerebral ischemic attack, unspecified: Secondary | ICD-10-CM | POA: Diagnosis not present

## 2017-06-29 DIAGNOSIS — I10 Essential (primary) hypertension: Secondary | ICD-10-CM | POA: Diagnosis not present

## 2017-06-29 DIAGNOSIS — E1159 Type 2 diabetes mellitus with other circulatory complications: Secondary | ICD-10-CM | POA: Diagnosis not present

## 2017-06-29 LAB — GLUCOSE, CAPILLARY
GLUCOSE-CAPILLARY: 221 mg/dL — AB (ref 65–99)
Glucose-Capillary: 221 mg/dL — ABNORMAL HIGH (ref 65–99)

## 2017-06-29 MED ORDER — METFORMIN HCL 500 MG PO TABS
1000.0000 mg | ORAL_TABLET | Freq: Two times a day (BID) | ORAL | Status: DC
  Administered 2017-06-29: 1000 mg via ORAL
  Filled 2017-06-29: qty 2

## 2017-06-29 MED ORDER — METOPROLOL SUCCINATE ER 100 MG PO TB24
100.0000 mg | ORAL_TABLET | Freq: Every day | ORAL | Status: DC
  Administered 2017-06-29: 100 mg via ORAL
  Filled 2017-06-29: qty 1

## 2017-06-29 MED ORDER — GLIPIZIDE 5 MG PO TABS
10.0000 mg | ORAL_TABLET | Freq: Two times a day (BID) | ORAL | Status: DC
  Administered 2017-06-29: 10 mg via ORAL
  Filled 2017-06-29: qty 2

## 2017-06-29 MED ORDER — APIXABAN 5 MG PO TABS: 5.0000 mg | ORAL_TABLET | Freq: Two times a day (BID) | ORAL | 0 refills | Status: DC

## 2017-06-29 MED ORDER — NICOTINE 21 MG/24HR TD PT24: 21.0000 mg | MEDICATED_PATCH | Freq: Every day | TRANSDERMAL | 0 refills | Status: DC

## 2017-06-29 NOTE — Discharge Summary (Signed)
Physician Discharge Summary  Richard [name] PNT:[id number] DOB: 18-Feb-1946 DOA: 06/27/2017  PCP: Dione Housekeeper, MD  Admit date: 06/27/2017 Discharge date: 06/29/2017   Recommendations for Outpatient Follow-Up:   1. Needs better blood sugar control 2. Titrate BP meds as needed 3. Smoking cessation 4. PSCK9 inhibitor 5. Referral to outpatient pulm for suspected emphysema 6. 9 mm ovoid focus of T2/FLAIR hyperintensity within the left paramedian pons. Finding is of uncertain etiology or significance, and may be related to chronic small vessel ischemic change or a previous infectious or inflammatory process. A low-grade glioma could also conceivably have this appearance. As a result, a follow-up MRI, with and without contrast, in 3-6 months is suggested for further evaluation.  Discharge Diagnosis:   Principal Problem:   TIA (transient ischemic attack) Active Problems:   Hypertension   Diabetes ([place])   CAD (coronary artery disease)   Atrial flutter [facility])   Discharge disposition:  Home  Discharge Condition: Improved.  Diet recommendation: Low sodium, heart healthy.  Carbohydrate-modified  Wound care: None.   History of Present Illness:   Richard [name] is a 72 y.o. male with medical history significant of CAD; HTN; HLD; and DM presenting with neurologic symptoms.   First, he noticed left-sided leg cramping.  About an hour later, he felt like his left arm "gave up and didn't want to move".  Then his face and lips started tingling on both sides.  Symptoms appear to have eased up, but he is not sure why.  The leg still cramps if he pulls his toes in.  He had an appointment this AM for a diabetes check up; he told the doctor this AM and she sent him to the ER.  He has intermittent "sinking spells" - it's like he loses his breath and gets real lightheaded.  It lasts a few seconds and then goes away and his head feels hot.  No changes in recet acitivites.  He has been feeling at baseline  but did complain of left leg cramping recently.       Hospital Course by Problem:   Right brain TIA Resultant neurologic symptoms resolved CTA head & neck occlusion LVA new since 2012 but not acute.  Progressed aortic arch and great vessel atherosclerosis since 2012.  No hemodynamically significant stenosis in the neck.  Only mild intracranial atherosclerosis progression.  Cervical spine fusion.  Emphysema. MRI  negative  2D Echo: - Normal LV systolic function; mild diastolic dysfunction; moderate LVH  LDL 191- does not tolerate statin HgbA1c 11.8 - Given history of atrial flutter, recommend DOAC- eliquis   Hx Atrial flutter  Ablation in 200/2008 by Dr. Caryl Comes  No documentation of atrial flutter in hospital . admitting EKG shows normal sinus rhythm.  Cardiac monitoring strips shows normal sinus rhythm. Given history of atrial flutter in setting of right brain TIA, recommend DOAC for secondary stroke prevention-- eliquis ordered  Hypertension Stable- resume home meds Permissive hypertension (OK if < 220/120) but gradually normalize in 5-7 days Long-term BP goal normotensive  Hyperlipidemia Home meds: No statin.  Intolerant to multiple statins in the past (myalgias) LDL 191, goal < 70 Recommend PSCK9 inhibitor.  Has to be ordered by outpatient physician.  Documentation of failed treatments required.  Diabetes type II HgbA1c 11.5, goal < 7.0 Uncontrolled-- defer to PCP addition of medications in case he has samples as patient has limited resources      Medical Consultants:    Neuro   Discharge Exam:  Vitals:   06/28/17 1620 06/28/17 2023  BP: (!) 161/89 (!) 161/93  Pulse: 73 81  Resp: 20 18  Temp: 97.9 F (36.6 C) 98 F (36.7 C)  TempSrc: Oral Oral  SpO2: 96% 96%  Weight:    Height:      Gen:  NAD    The results of significant diagnostics from this hospitalization (including imaging, microbiology, ancillary and laboratory) are listed below for  reference.     Procedures and Diagnostic Studies:   Ct Angio Head W Or Wo Contrast  Addendum Date: 06/27/2017   ADDENDUM REPORT: 06/27/2017 12:42 ADDENDUM: Study discussed by telephone with Dr. Leonette Monarch in the ED on 06/27/2017 at 1233 hours. Electronically Signed   By: Genevie Ann M.D.   On: 06/27/2017 12:42   Result Date: 06/27/2017 CLINICAL DATA:  72 year old male code stroke. Last seen normal 0730 hours. Left side weakness. EXAM: CT ANGIOGRAPHY HEAD AND NECK TECHNIQUE: Multidetector CT imaging of the head and neck was performed using the standard protocol during bolus administration of intravenous contrast. Multiplanar CT image reconstructions and MIPs were obtained to evaluate the vascular anatomy. Carotid stenosis measurements (when applicable) are obtained utilizing NASCET criteria, using the distal internal carotid diameter as the denominator. CONTRAST:  38mL ISOVUE-370 IOPAMIDOL (ISOVUE-370) INJECTION 76% COMPARISON:  Head CT without contrast 1139 hours today. CTA head and neck 09/23/2010. 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 ACDF hardware remains in place at C6-C7. Mild anterolisthesis at C7-T1 associated with moderate to severe facet degeneration is stable. No acute osseous abnormality identified. Upper chest: Centrilobular and paraseptal emphysema. No upper lung nodule. No superior mediastinal lymphadenopathy. Other neck: Negative.  No neck mass or lymphadenopathy. Aortic arch: Soft and calcified aortic arch and great vessel origin atherosclerosis has progressed since 2012. Three vessel arch configuration. Right carotid system: No brachiocephalic or right CCA origin stenosis despite plaque. Negative right carotid bifurcation, cervical right ICA. Left carotid system: Progressed left CCA origin soft and calcified plaque since 2012 but less than 50 % stenosis with respect to the distal vessel results. Similar soft plaque along the medial left  CCA at the level of the larynx without stenosis. However, the left carotid bifurcation remains widely patent with minimal plaque. No cervical left ICA stenosis. Vertebral arteries: No significant proximal right subclavian artery stenosis despite plaque. Soft and calcified plaque at the right vertebral artery origin without stenosis. The right vertebral artery is patent to the skull base without stenosis. The right V 2 segment is tortuous. Progressed proximal left subclavian artery soft and calcified plaque since 2012 but no hemodynamically significant stenosis. However, the left vertebral artery today is poorly enhancing from its origin and throughout its course to the skull base. There is chronic origin stenosis which was at least moderate but appears progressed since 2012. CTA HEAD Posterior circulation: The right PICA and distal right vertebral artery remain patent and appear stable since 2012 with mild to moderate atherosclerotic irregularity but only mild associated stenosis to the vertebrobasilar junction. The left vertebral artery V4 segment appears patent but is probably supplied in a retrograde fashion from the vertebrobasilar junction to the patent left PICA origin. The left vertebral proximal to the PICA appears occluded. The basilar artery is patent without stenosis. The SCA and PCA origins are patent and stable. Posterior communicating arteries are diminutive or absent. There is mild irregularity of the left PCA P1 and P2 segments. The bilateral PCA branches are otherwise within normal limits. Anterior circulation:  Both ICA siphons are patent. Bilateral siphon calcified plaque appears stable since 2012. Mild siphon stenosis on the left. Mild to moderate stenosis on the right at the distal cavernous segment and supraclinoid segment. The MCA and ACA origins are patent and stable with mild irregularity. Anterior communicating artery and bilateral ACA branches are within normal limits. Left MCA M1 segment,  bifurcation, and left MCA branches are stable and within normal limits. The right MCA M1 segment is tortuous and bifurcates early. The right MCA branches demonstrate increased mild irregularity but appear patent. No branch occlusion identified. Venous sinuses: Patent. Anatomic variants: None. Review of the MIP images confirms the above findings IMPRESSION: 1. 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 The distal left V4 segment and left PICA are patent and probably supplied in a retrograde fashion from the vertebrobasilar junction. 2. Progressed aortic arch and great vessel atherosclerosis since 2012 but no other hemodynamically significant stenosis in the neck. There is only minimal cervical ICA atherosclerosis. 3. Intracranial atherosclerosis has mildly progressed since 2012 with mild to moderate right ICA siphon stenosis. No other significant intracranial stenosis, and no intracranial arterial occlusion. 4. Chronic widespread cervical spine fusion. 5. Emphysema (ICD10-J43.9) and Aortic Atherosclerosis (ICD10-I70.0). Electronically Signed: By: Genevie Ann M.D. On: 06/27/2017 12:30   Dg Chest 2 View  Result Date: 06/28/2017 CLINICAL DATA:  Follow-up COPD EXAM: CHEST - 2 VIEW COMPARISON:  10/22/2013 FINDINGS: Cardiac shadow is stable. The lungs are mildly hyperinflated consistent with the given clinical history. No focal infiltrate or sizable effusion is seen. No acute bony abnormality is noted. Postsurgical changes in the cervical spine are again seen. IMPRESSION: COPD without acute abnormality. Electronically Signed   By: Inez Catalina M.D.   On: 06/28/2017 15:21   Ct Angio Neck W Or Wo Contrast  Addendum Date: 06/27/2017   ADDENDUM REPORT: 06/27/2017 12:42 ADDENDUM: Study discussed by telephone with Dr. Leonette Monarch in the ED on  06/27/2017 at 1233 hours. Electronically Signed   By: Genevie Ann M.D.   On: 06/27/2017 12:42   Result Date: 06/27/2017 CLINICAL DATA:  72 year old male code stroke. Last seen normal 0730 hours. Left side weakness. EXAM: CT ANGIOGRAPHY HEAD AND NECK TECHNIQUE: Multidetector CT imaging of the head and neck was performed using the standard protocol during bolus administration of intravenous contrast. Multiplanar CT image reconstructions and MIPs were obtained to evaluate the vascular anatomy. Carotid stenosis measurements (when applicable) are obtained utilizing NASCET criteria, using the distal internal carotid diameter as the denominator. CONTRAST:  74mL ISOVUE-370 IOPAMIDOL (ISOVUE-370) INJECTION 76% COMPARISON:  Head CT without contrast 1139 hours today. CTA head and neck 09/23/2010. 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 ACDF hardware remains in place at C6-C7. Mild anterolisthesis at C7-T1 associated with moderate to severe facet degeneration is stable. No acute osseous abnormality identified. Upper chest: Centrilobular and paraseptal emphysema. No upper lung nodule. No superior mediastinal lymphadenopathy. Other neck: Negative.  No neck mass or lymphadenopathy. Aortic arch: Soft and calcified aortic arch and great vessel origin atherosclerosis has progressed since 2012. Three vessel arch configuration. Right carotid system: No brachiocephalic or right CCA origin stenosis despite plaque. Negative right carotid bifurcation, cervical right ICA. Left carotid system: Progressed left CCA origin soft and calcified plaque since 2012 but less than 50 % stenosis with respect to the  distal vessel results. Similar soft plaque along the medial left CCA at the level of the larynx without stenosis. However, the left carotid bifurcation remains widely patent with minimal plaque. No cervical left ICA stenosis. Vertebral arteries: No significant proximal right subclavian  artery stenosis despite plaque. Soft and calcified plaque at the right vertebral artery origin without stenosis. The right vertebral artery is patent to the skull base without stenosis. The right V 2 segment is tortuous. Progressed proximal left subclavian artery soft and calcified plaque since 2012 but no hemodynamically significant stenosis. However, the left vertebral artery today is poorly enhancing from its origin and throughout its course to the skull base. There is chronic origin stenosis which was at least moderate but appears progressed since 2012. CTA HEAD Posterior circulation: The right PICA and distal right vertebral artery remain patent and appear stable since 2012 with mild to moderate atherosclerotic irregularity but only mild associated stenosis to the vertebrobasilar junction. The left vertebral artery V4 segment appears patent but is probably supplied in a retrograde fashion from the vertebrobasilar junction to the patent left PICA origin. The left vertebral proximal to the PICA appears occluded. The basilar artery is patent without stenosis. The SCA and PCA origins are patent and stable. Posterior communicating arteries are diminutive or absent. There is mild irregularity of the left PCA P1 and P2 segments. The bilateral PCA branches are otherwise within normal limits. Anterior circulation: Both ICA siphons are patent. Bilateral siphon calcified plaque appears stable since 2012. Mild siphon stenosis on the left. Mild to moderate stenosis on the right at the distal cavernous segment and supraclinoid segment. The MCA and ACA origins are patent and stable with mild irregularity. Anterior communicating artery and bilateral ACA branches are within normal limits. Left MCA M1 segment, bifurcation, and left MCA branches are stable and within normal limits. The right MCA M1 segment is tortuous and bifurcates early. The right MCA branches demonstrate increased mild irregularity but appear patent. No branch  occlusion identified. Venous sinuses: Patent. Anatomic variants: None. Review of the MIP images confirms the above findings IMPRESSION: 1. 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 The distal left V4 segment and left PICA are patent and probably supplied in a retrograde fashion from the vertebrobasilar junction. 2. Progressed aortic arch and great vessel atherosclerosis since 2012 but no other hemodynamically significant stenosis in the neck. There is only minimal cervical ICA atherosclerosis. 3. Intracranial atherosclerosis has mildly progressed since 2012 with mild to moderate right ICA siphon stenosis. No other significant intracranial stenosis, and no intracranial arterial occlusion. 4. Chronic widespread cervical spine fusion. 5. Emphysema (ICD10-J43.9) and Aortic Atherosclerosis (ICD10-I70.0). Electronically Signed: By: Genevie Ann M.D. On: 06/27/2017 12:30   Mr Brain Wo Contrast  Result Date: 06/27/2017 CLINICAL DATA:  Initial evaluation for left-sided weakness, paresthesias. EXAM: MRI HEAD WITHOUT CONTRAST TECHNIQUE: Multiplanar, multiecho pulse sequences of the brain and surrounding structures were obtained without intravenous contrast. COMPARISON:  Prior CTA from earlier the same day. FINDINGS: Brain: Cerebral volume within normal limits for age. No significant cerebral white matter disease for age. There is a single ovoid 9 mm focus of T2/FLAIR signal abnormality within the left paramedian pons (series 8001, image 9). No intrinsic diffusion abnormality. Finding is of uncertain significance, and could be related chronic microvascular ischemic change or possible prior infectious or inflammatory  process. In underlying low-grade lesions/mass is not entirely excluded. No other mass lesion. No midline shift or mass effect. No  hydrocephalus. No extra-axial fluid collection. No evidence for acute or subacute infarct. Gray-white matter differentiation maintained. No other evidence for chronic infarction. No evidence for acute or chronic intracranial hemorrhage. Major dural sinuses grossly patent. Pituitary gland and suprasellar region normal. Midline structures intact and normal. Vascular: Irregular flow void noted within the diminutive left V4 segment, likely due to atherosclerotic changes seen on prior CTA. Major intracranial vascular flow voids otherwise maintained. Skull and upper cervical spine: Degenerative thickening noted at the tectorial membrane. Craniocervical junction otherwise unremarkable. Bone marrow signal intensity normal. No scalp soft tissue abnormality. Sinuses/Orbits: Globes and orbital soft tissues within normal limits. Mild scattered mucosal thickening throughout the ethmoidal air cells and maxillary sinuses. Superimposed small air-fluid level noted within the left maxillary sinus, consistent with acute sinusitis. No mastoid effusion. Inner ear structures within normal limits. Other: None. IMPRESSION: 1. No acute intracranial abnormality. 2. 9 mm ovoid focus of T2/FLAIR hyperintensity within the left paramedian pons. Finding is of uncertain etiology or significance, and may be related to chronic small vessel ischemic change or a previous infectious or inflammatory process. A low-grade glioma could also conceivably have this appearance. As a result, a follow-up MRI, with and without contrast, in 3-6 months is suggested for further evaluation. 3. Otherwise normal brain MRI. 4. Mild left maxillary sinusitis. Electronically Signed   By: Jeannine Boga M.D.   On: 06/27/2017 21:16   Ct Head Code Stroke Wo Contrast`  Addendum Date: 06/27/2017   ADDENDUM REPORT: 06/27/2017 11:59 ADDENDUM: These results were called by telephone at the time of interpretation on 06/27/2017 at 11:58 am to Dr. Cheral Marker, who verbally  acknowledged these results. Electronically Signed   By: Franchot Gallo M.D.   On: 06/27/2017 11:59   Result Date: 06/27/2017 CLINICAL DATA:  Code stroke.  Left-sided weakness EXAM: CT HEAD WITHOUT CONTRAST TECHNIQUE: Contiguous axial images were obtained from the base of the skull through the vertex without intravenous contrast. COMPARISON:  CT head 09/23/2010 FINDINGS: Brain: No evidence of acute infarction, hemorrhage, hydrocephalus, extra-axial collection or mass lesion/mass effect. Vascular: Negative for hyperdense vessel Skull: Negative Sinuses/Orbits: Negative Other: None ASPECTS ([place] Stroke Program Early CT Score) - Ganglionic level infarction (caudate, lentiform nuclei, internal capsule, insula, M1-M3 cortex): 7 - Supraganglionic infarction (M4-M6 cortex): 3 Total score (0-10 with 10 being normal): 10 IMPRESSION: 1. Normal CT head 2. ASPECTS is 10 Electronically Signed: By: Franchot Gallo M.D. On: 06/27/2017 11:46     Labs:   Basic Metabolic Panel: Recent Labs  Lab 06/27/17 1128 06/27/17 1139  NA 136 138  K 4.6 4.7  CL 101 102  CO2 26  --   GLUCOSE 184* 190*  BUN 18 21*  CREATININE 1.15 1.10  CALCIUM 9.4  --    GFR Estimated Creatinine Clearance: 67.6 mL/min (by C-G formula based on SCr of 1.1 mg/dL). Liver Function Tests: Recent Labs  Lab 06/27/17 1128  AST 21  ALT 20  ALKPHOS 82  BILITOT 0.6  PROT 7.2  ALBUMIN 3.9   No results for input(s): LIPASE, AMYLASE in the last 168 hours. No results for input(s): AMMONIA in the last 168 hours. Coagulation profile Recent Labs  Lab 06/27/17 1128  INR 0.96    CBC: Recent Labs  Lab 06/27/17 1128 06/27/17 1139  WBC 8.1  --   NEUTROABS 4.8  --   HGB 14.7 15.3  HCT  44.6 45.0  MCV 86.3  --   PLT 190  --    Cardiac Enzymes: Recent Labs  Lab 06/28/17 0723  CKTOTAL 115   BNP: Invalid input(s): POCBNP CBG: Recent Labs  Lab 06/28/17 0627 06/28/17 1120 06/28/17 1617 06/28/17 2136 06/29/17 0648  GLUCAP 184*  232* 208* 167* 221*   D-Dimer No results for input(s): DDIMER in the last 72 hours. Hgb A1c Recent Labs    06/27/17 1954  HGBA1C 11.5*   Lipid Profile Recent Labs    06/28/17 0723  CHOL 262*  HDL 39*  LDLCALC 191*  TRIG 159*  CHOLHDL 6.7   Thyroid function studies Recent Labs    06/27/17 1954  TSH 3.672   Anemia work up No results for input(s): VITAMINB12, FOLATE, FERRITIN, TIBC, IRON, RETICCTPCT in the last 72 hours. Microbiology No results found for this or any previous visit (from the past 240 hour(s)).   Discharge Instructions:   Discharge Instructions    Ambulatory referral to Neurology   Complete by:  As directed    Follow up with stroke clinic NP (Rai Severns Vanschaick or Cecille Rubin, if both not available, consider Dr. Antony Contras, Dr. Bess Harvest, or Dr. Sarina Ill) at [facility] in about 4 weeks.   Ambulatory referral to Nutrition and Diabetic Education   Complete by:  As directed    A1C 11.5%; wife has lots of questions about medication and diet and are asking for some additional education on DM   Diet - low sodium heart healthy   Complete by:  As directed    Diet Carb Modified   Complete by:  As directed    Discharge instructions   Complete by:  As directed    As you are a diabetic, would recommend being on ACE/ARD for your blood pressure Also as you can not take statins there are new injectable medications that could help with your cholesterol Referral to pulmonology for your COPD Stop smoking Discuss with PCP which diabetic medications would be covered by your insurance   Increase activity slowly   Complete by:  As directed      Allergies as of 06/29/2017      Reactions   Plavix [clopidogrel]    Leg pain   Statins    Leg pain      Medication List    TAKE these medications   acetaminophen 325 MG tablet Commonly known as:  TYLENOL Take 325 mg by mouth every 6 (six) hours as needed for fever.   apixaban 5 MG  Tabs tablet Commonly known as:  ELIQUIS Take 1 tablet (5 mg total) by mouth 2 (two) times daily.   aspirin 81 MG tablet Take 81 mg by mouth daily. What changed:  Another medication with the same name was removed. Continue taking this medication, and follow the directions you see here.   glipiZIDE 10 MG tablet Commonly known as:  GLUCOTROL Take 10 mg by mouth 2 (two) times daily before a meal.   metFORMIN 1000 MG tablet Commonly known as:  GLUCOPHAGE Take 1,000 mg by mouth 2 (two) times daily with a meal.   metoprolol tartrate 100 MG tablet Commonly known as:  LOPRESSOR Take 100 mg by mouth daily.   nicotine 21 mg/24hr patch Commonly known as:  NICODERM CQ - dosed in mg/24 hours Place 1 patch (21 mg total) onto the skin daily.   ONE TOUCH ULTRA TEST test strip Generic drug:  glucose blood 1 each.  Follow-up Information    [facility] Follow up in 4 week(s).   Specialty:  Neurology Why:   Office will call with appointment date and time. Contact information: [address] [place] [phone]       Dione Housekeeper, MD Follow up in 1 week(s).   Specialty:  Family Medicine Contact information: [address] [place] [id number] [phone]            Time coordinating discharge: 35 min  Signed:  Geradine Girt   [facility] 06/29/2017, 10:40 AM

## 2017-06-29 NOTE — Care Management Note (Addendum)
Case Management Note  Patient Details  Name: Richard [name] MRN: [id number] Date of Birth: 1945-08-18  Subjective/Objective:   TIA, HTN                Action/Plan: NCM spoke to pt and provided pt with Eliquis 30 day free trial card. Contacted [facility] in [place] and they do have in stock. Will check benefits for Eliquis. Per Pharmacy, copay $45.   Expected Discharge Date:  06/29/17               Expected Discharge Plan:  Home/Self Care  In-House Referral:  NA  Discharge planning Services  CM Consult, Medication Assistance  Post Acute Care Choice:  NA Choice offered to:  NA  DME Arranged:  N/A DME Agency:  NA  HH Arranged:  NA HH Agency:  NA  Status of Service:  Completed, signed off  If discussed at [place] of Stay Meetings, dates discussed:    Additional Comments:  Erenest Rasher, RN 06/29/2017, 10:47 AM

## 2017-06-29 NOTE — Plan of Care (Signed)
Adedquate for discharge

## 2017-06-29 NOTE — Care Management Obs Status (Signed)
[place] NOTIFICATION   Patient Details  Name: Richard [name] MRN: [id number] Date of Birth: Aug 19, 1945   Medicare Observation Status Notification Given:  Yes    Erenest Rasher, RN 06/29/2017, 10:43 AM

## 2017-07-13 ENCOUNTER — Encounter: Payer: Self-pay | Admitting: Internal Medicine

## 2017-07-21 ENCOUNTER — Ambulatory Visit: Payer: Medicare Other | Admitting: Dietician

## 2017-07-22 DIAGNOSIS — Z789 Other specified health status: Secondary | ICD-10-CM | POA: Insufficient documentation

## 2017-07-22 DIAGNOSIS — I6522 Occlusion and stenosis of left carotid artery: Secondary | ICD-10-CM | POA: Insufficient documentation

## 2017-08-02 ENCOUNTER — Encounter: Payer: Self-pay | Admitting: Adult Health

## 2017-08-02 ENCOUNTER — Telehealth: Payer: Self-pay

## 2017-08-02 ENCOUNTER — Ambulatory Visit: Payer: Self-pay | Admitting: Adult Health

## 2017-08-02 NOTE — Telephone Encounter (Signed)
Patient no show for appointment today.

## 2017-08-02 NOTE — Progress Notes (Deleted)
[facility] [address] [place]. [place] [address] [phone]       OFFICE FOLLOW UP NOTE  Mr. CHRISTROPHER GINTZ Date of Birth:  13-Aug-1945 Medical Record Number:  [id number]   Reason for Referral:  hospital TIA follow up  CHIEF COMPLAINT:  No chief complaint on file.   HPI: Richard [name] is being seen today for initial visit in the office for TIA on 06/27/17. History obtained from patient and chart review. Reviewed all radiology images and labs personally.  [name] is a 72 y.o. male with history of atrial flutter status post ablation, hypertension, hyperlipidemia, cardiac disease with stent placement, diabetes  who presented with left lower extremity cramp with left hemiparesis and perioral numbness and tingling.   All symptoms resolved within 12 hours patient denied headaches or history of migraine headaches.  CT head reviewed and showed no acute stroke.  CTA head and neck showed occlusion LVA which was new since 2012 but not acute, progressive aortic arch and great vessel arthrosclerosis but no hemodynamically significant stenosis in the neck only mild intracranial atherosclerosis progression.  MRI was negative for acute infarct however there was a left pontine and normal T2/flair signal and recommended repeat MRI in 3 to 6 months.  2D echo showed EF of 55 to 60%.  LDL 191 and as patient has been intolerant to multiple statins in the past it was recommended PSCK9 inhibitor which needs to be ordered outpatient.  A1c 11.5 and recommended close outpatient follow-up as long as tight control during admission.  Patient does have a history of atrial flutter and in the setting of right brain TIA, is recommended DOAC secondary stroke prevention such as Eliquis 5 mg twice daily and aspirin 81.  Patient discharged home in stable condition.       ROS:   14 system review of systems performed and negative with exception of ***  PMH:  Past Medical History:  Diagnosis  Date  . Diabetes mellitus   . HLD (hyperlipidemia)   . Hypertension   . MI (myocardial infarction) ([place])    cardiac stents x2  . Pneumonia     PSH:  Past Surgical History:  Procedure Laterality Date  . BALLOON ANGIOPLASTY, ARTERY    . CARDIAC ELECTROPHYSIOLOGY MAPPING AND ABLATION    . CARPAL TUNNEL RELEASE Bilateral   . cervical neck fusion     x 4  . CORONARY STENT PLACEMENT     x 2  . ROTATOR CUFF REPAIR Right    x 4  . TIBIA FRACTURE SURGERY Left     Social History:  Social History   Socioeconomic History  . Marital status: Married    Spouse name: Not on file  . Number of children: 4  . Years of education: Not on file  . Highest education level: Not on file  Occupational History  . Occupation: truck Animator Needs  . Financial resource strain: Not on file  . Food insecurity:    Worry: Not on file    Inability: Not on file  . Transportation needs:    Medical: Not on file    Non-medical: Not on file  Tobacco Use  . Smoking status: Current Every Day Smoker    Packs/day: 1.00    Years: 54.00    Pack years: 54.00    Types: Cigarettes  . Smokeless tobacco: Never Used  Substance and Sexual Activity  . Alcohol use: No    Alcohol/week: 0.0 oz  Comment: rare  . Drug use: No  . Sexual activity: Not on file  Lifestyle  . Physical activity:    Days per week: Not on file    Minutes per session: Not on file  . Stress: Not on file  Relationships  . Social connections:    Talks on phone: Not on file    Gets together: Not on file    Attends religious service: Not on file    Active member of club or organization: Not on file    Attends meetings of clubs or organizations: Not on file    Relationship status: Not on file  . Intimate partner violence:    Fear of current or ex partner: Not on file    Emotionally abused: Not on file    Physically abused: Not on file    Forced sexual activity: Not on file  Other Topics Concern  . Not on file  Social  History Narrative  . Not on file    Family History:  Family History  Problem Relation Age of Onset  . Diabetes Mother   . Heart disease Mother   . Heart disease Brother   . Emphysema Father   . Lung cancer Brother        x 2  . Colon cancer Neg Hx   . Esophageal cancer Neg Hx   . Rectal cancer Neg Hx   . Stomach cancer Neg Hx   . Prostate cancer Neg Hx     Medications:   Current Outpatient Medications on File Prior to Visit  Medication Sig Dispense Refill  . acetaminophen (TYLENOL) 325 MG tablet Take 325 mg by mouth every 6 (six) hours as needed for fever.    [name] apixaban (ELIQUIS) 5 MG TABS tablet Take 1 tablet (5 mg total) by mouth 2 (two) times daily. 60 tablet 0  . aspirin 81 MG tablet Take 81 mg by mouth daily.    [name] glipiZIDE (GLUCOTROL) 10 MG tablet Take 10 mg by mouth 2 (two) times daily before a meal.    . glucose blood (ONE TOUCH ULTRA TEST) test strip 1 each.    . metFORMIN (GLUCOPHAGE) 1000 MG tablet Take 1,000 mg by mouth 2 (two) times daily with a meal.    . metoprolol (LOPRESSOR) 100 MG tablet Take 100 mg by mouth daily.    . nicotine (NICODERM CQ - DOSED IN MG/24 HOURS) 21 mg/24hr patch Place 1 patch (21 mg total) onto the skin daily. 28 patch 0   No current facility-administered medications on file prior to visit.     Allergies:   Allergies  Allergen Reactions  . Plavix [Clopidogrel]     Leg pain  . Statins     Leg pain     Physical Exam  There were no vitals filed for this visit. There is no height or weight on file to calculate BMI. No exam data present  General: well developed, well nourished, seated, in no evident distress Head: head normocephalic and atraumatic.   Neck: supple with no carotid or supraclavicular bruits Cardiovascular: regular rate and rhythm, no murmurs Musculoskeletal: no deformity Skin:  no rash/petichiae Vascular:  Normal pulses all extremities  Neurologic Exam Mental Status: Awake and fully alert. Oriented to place and  time. Recent and remote memory intact. Attention span, concentration and fund of knowledge appropriate. Mood and affect appropriate.  No flowsheet data found. Cranial Nerves: Fundoscopic exam reveals sharp disc margins. Pupils equal, briskly reactive to light. Extraocular movements full  without nystagmus. Visual fields full to confrontation. Hearing intact. Facial sensation intact. Face, tongue, palate moves normally and symmetrically.  Motor: Normal bulk and tone. Normal strength in all tested extremity muscles. Sensory.: intact to touch , pinprick , position and vibratory sensation.  Coordination: Rapid alternating movements normal in all extremities. Finger-to-nose and heel-to-shin performed accurately bilaterally. Gait and Station: Arises from chair without difficulty. Stance is normal. Gait demonstrates normal stride length and balance . Able to heel, toe and tandem walk without difficulty.  Reflexes: 1+ and symmetric. Toes downgoing.    NIHSS  *** Modified Rankin  ***   Diagnostic Data (Labs, Imaging, Testing)  CT HEAD WO CONTRAST 06/27/2017 IMPRESSION: 1. Normal CT head 2. ASPECTS is 10  CT ANGIO NECK W OR WO CONTRAST CT ANGIO HEAD W OR WO CONTRAST 06/27/2017 IMPRESSION: 1. 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 The distal left V4 segment and left PICA are patent and probably supplied in a retrograde fashion from the vertebrobasilar junction. 2. Progressed aortic arch and great vessel atherosclerosis since 2012 but no other hemodynamically significant stenosis in the neck. There is only minimal cervical ICA atherosclerosis. 3. Intracranial atherosclerosis has mildly progressed since 2012 with mild to moderate right ICA siphon stenosis. No other significant intracranial stenosis, and no  intracranial arterial occlusion. 4. Chronic widespread cervical spine fusion. 5. Emphysema (ICD10-J43.9) and Aortic Atherosclerosis (ICD10-I70.0).  MR BRAIN WO CONTRAST 06/27/2017 IMPRESSION: 1. No acute intracranial abnormality. 2. 9 mm ovoid focus of T2/FLAIR hyperintensity within the left paramedian pons. Finding is of uncertain etiology or significance, and may be related to chronic small vessel ischemic change or a previous infectious or inflammatory process. A low-grade glioma could also conceivably have this appearance. As a result, a follow-up MRI, with and without contrast, in 3-6 months is suggested for further evaluation. 3. Otherwise normal brain MRI. 4. Mild left maxillary sinusitis.  ECHOCARDIOGRAM COMPLETE 06/28/2017 Study Conclusions - Left ventricle: The cavity size was normal. Wall thickness was   increased in a pattern of moderate LVH. Systolic function was   normal. The estimated ejection fraction was in the range of 55%   to 60%. Wall motion was normal; there were no regional wall   motion abnormalities. Doppler parameters are consistent with   abnormal left ventricular relaxation (grade 1 diastolic   dysfunction). Impressions: - Normal LV systolic function; mild diastolic dysfunction;   moderate LVH.    ASSESSMENT: Richard [name] is a 72 y.o. year old male here with right brain TIA on 06/27/2017 secondary to unclear but concern for cardioembolic event given history of atrial flutter status post ablation. Vascular risk factors include HTN, HLD, and DM.     PLAN: -Continue aspirin 81 mg daily and Eliquis (apixaban) daily for secondary stroke prevention -Repeat MRI in 2 to 5 months -will address at follow-up appointment -F/u with PCP regarding your HLD, HTN and DM management -continue to monitor BP at home  -Maintain strict control of hypertension with blood pressure goal below 130/90, diabetes with hemoglobin A1c goal below 6.5% and cholesterol with LDL  cholesterol (bad cholesterol) goal below 70 mg/dL. I also advised the patient to eat a healthy diet with plenty of whole grains, cereals, fruits and vegetables, exercise regularly and maintain ideal body weight.  Follow up in *** or call earlier if needed  Greater than 50% of time during this 25 minute visit was spent on counseling,explanation of diagnosis of ***, reviewing risk factor management of ***, planning of further management, discussion with patient and family and coordination of care    Venancio Poisson, [facility]  [facility] [address] [place], [place] [address]  Phone [phone] Fax [phone]

## 2017-08-16 ENCOUNTER — Ambulatory Visit: Payer: Medicare Other | Admitting: Adult Health

## 2017-08-16 ENCOUNTER — Encounter: Payer: Self-pay | Admitting: Adult Health

## 2017-08-16 VITALS — BP 158/76 | HR 60 | Ht 72.0 in | Wt 192.0 lb

## 2017-08-16 DIAGNOSIS — G459 Transient cerebral ischemic attack, unspecified: Secondary | ICD-10-CM | POA: Diagnosis not present

## 2017-08-16 DIAGNOSIS — I10 Essential (primary) hypertension: Secondary | ICD-10-CM | POA: Diagnosis not present

## 2017-08-16 DIAGNOSIS — E1159 Type 2 diabetes mellitus with other circulatory complications: Secondary | ICD-10-CM

## 2017-08-16 DIAGNOSIS — I25119 Atherosclerotic heart disease of native coronary artery with unspecified angina pectoris: Secondary | ICD-10-CM

## 2017-08-16 MED ORDER — EVOLOCUMAB 140 MG/ML ~~LOC~~ SOSY: 140.0000 mg | PREFILLED_SYRINGE | SUBCUTANEOUS | 4 refills | Status: DC

## 2017-08-16 NOTE — Progress Notes (Signed)
[facility] [address] [place]. [place] [address] [phone]       OFFICE FOLLOW UP NOTE  [name] Date of Birth:  Oct 28, 1945 Medical Record Number:  [id number]   Reason for Referral:  hospital TIA follow up  CHIEF COMPLAINT:  Chief Complaint  Patient presents with  . Follow-up    Stroke follow up room 9 patient is with Richard [name] his wife    HPI: Richard [name] is being seen today for initial visit in the office for TIA on 06/27/17. History obtained from patient and chart review. Reviewed all radiology images and labs personally.  Richard [name] is a 72 y.o. male with history of atrial flutter status post ablation, hypertension, hyperlipidemia, cardiac disease with stent placement, and diabetes who presented with left lower extremity cramp with left hemiparesis and perioral numbness and tingling.  CT head reviewed showed no acute stroke.  CTA head and neck showed occlusion left VA which is new since 2012 but not acute, progressive aortic arch and great vessel arthrosclerosis since 2012 but no hemodynamically significant stenosis in the neck.  MRI was negative for acute abnormality.  2D echo showed an EF of 55 to 60%.  LDL 191 and as patient was not on statin PTA due to intolerance to multiple statins in the past it was recommended to start PSCK9 inhibitor and recommended to be started by outpatient physician.  A1c uncontrolled at 11.5 and recommended tight glycemic control with close PCP follow-up.  Patient was on aspirin 325 PTA but due to history of a flutter status post ablation and concern of cardioembolic event it was recommended to start Eliquis 5 mg daily along with aspirin 81 mg for stroke prevention.  Patient discharged home in stable condition.  Patient is being seen today for hospital follow-up and is accompanied by his wife.  Continues to take both Eliquis and aspirin with mild bruising but no bleeding.  Spoke to patient regarding starting Repatha for  cholesterol management.  Patient and wife are both concerned of price of medication especially in combination of price of Eliquis ($45/month).  Labs recently checked by PCP on 07/11/2017 and LDL 152 and A1c 11.3.  Patient and wife have been attempting to eat healthier and avoiding carbs and high fat foods.  Patient has complaints of a.m. Headaches for the past couple weeks.  Consists of a dull ache and last approximately 2 to 3 hours and then will resolve on its own.  He has taken Goody powder x1 but refuses use of Tylenol.  He states this affects him in the back of his head, down his left neck and down into his shoulder.  Patient does have extensive history of neck injuries with surgeries but denies histories of headaches or migraines.  Patient has returned to doing all previous activities without complications.  Denies new or worsening stroke/TIA symptoms.  ROS:   14 system review of systems performed and negative with exception of shortness of breath, easy bruising, easy bleeding, runny nose, and headaches  PMH:  Past Medical History:  Diagnosis Date  . Diabetes mellitus   . HLD (hyperlipidemia)   . Hypertension   . MI (myocardial infarction) ([place])    cardiac stents x2  . Pneumonia     PSH:  Past Surgical History:  Procedure Laterality Date  . BALLOON ANGIOPLASTY, ARTERY    . CARDIAC ELECTROPHYSIOLOGY MAPPING AND ABLATION    . CARPAL TUNNEL RELEASE Bilateral   . cervical neck fusion  x 4  . CORONARY STENT PLACEMENT     x 2  . ROTATOR CUFF REPAIR Right    x 4  . TIBIA FRACTURE SURGERY Left     Social History:  Social History   Socioeconomic History  . Marital status: Married    Spouse name: Not on file  . Number of children: 4  . Years of education: Not on file  . Highest education level: Not on file  Occupational History  . Occupation: truck Animator Needs  . Financial resource strain: Not on file  . Food insecurity:    Worry: Not on file    Inability: Not on  file  . Transportation needs:    Medical: Not on file    Non-medical: Not on file  Tobacco Use  . Smoking status: Former Smoker    Packs/day: 1.00    Years: 54.00    Pack years: 54.00    Types: Cigarettes    Last attempt to quit: 07/11/2017    Years since quitting: 0.0  . Smokeless tobacco: Never Used  Substance and Sexual Activity  . Alcohol use: No    Alcohol/week: 0.0 oz    Comment: rare  . Drug use: No  . Sexual activity: Not on file  Lifestyle  . Physical activity:    Days per week: Not on file    Minutes per session: Not on file  . Stress: Not on file  Relationships  . Social connections:    Talks on phone: Not on file    Gets together: Not on file    Attends religious service: Not on file    Active member of club or organization: Not on file    Attends meetings of clubs or organizations: Not on file    Relationship status: Not on file  . Intimate partner violence:    Fear of current or ex partner: Not on file    Emotionally abused: Not on file    Physically abused: Not on file    Forced sexual activity: Not on file  Other Topics Concern  . Not on file  Social History Narrative  . Not on file    Family History:  Family History  Problem Relation Age of Onset  . Diabetes Mother   . Heart disease Mother   . Heart disease Brother   . Emphysema Father   . Lung cancer Brother        x 2  . Colon cancer Neg Hx   . Esophageal cancer Neg Hx   . Rectal cancer Neg Hx   . Stomach cancer Neg Hx   . Prostate cancer Neg Hx     Medications:   Current Outpatient Medications on File Prior to Visit  Medication Sig Dispense Refill  . apixaban (ELIQUIS) 5 MG TABS tablet Take 1 tablet (5 mg total) by mouth 2 (two) times daily. 60 tablet 0  . aspirin 325 MG tablet Take by mouth.    [name] glipiZIDE (GLUCOTROL) 10 MG tablet Take 10 mg by mouth 2 (two) times daily before a meal.    . glucose blood (ONE TOUCH ULTRA TEST) test strip 1 each.    . metFORMIN (GLUCOPHAGE) 1000 MG  tablet Take by mouth.    . metoprolol (LOPRESSOR) 100 MG tablet Take 100 mg by mouth daily.    . nitroGLYCERIN (NITROSTAT) 0.4 MG SL tablet PLACE ONE TABLET (0.4 MG DOSE) UNDER THE TONGUE EVERY 5 (FIVE) MINUTES AS NEEDED FOR CHEST PAIN.  3  . pioglitazone (ACTOS) 15 MG tablet Take by mouth.     No current facility-administered medications on file prior to visit.     Allergies:   Allergies  Allergen Reactions  . Plavix [Clopidogrel]     Leg pain  . Statins     Leg pain     Physical Exam  Vitals:   08/16/17 1430  BP: (!) 158/76  Pulse: 60  Weight: 192 lb (87.1 kg)  Height: 6' (1.829 m)   Body mass index is 26.04 kg/m. No exam data present  General: well developed, well nourished, pleasant elderly Caucasian male, seated, in no evident distress Head: head normocephalic and atraumatic.   Neck: supple with no carotid or supraclavicular bruits Cardiovascular: regular rate and rhythm, no murmurs Musculoskeletal: no deformity Skin:  no rash/petichiae Vascular:  Normal pulses all extremities  Neurologic Exam Mental Status: Awake and fully alert. Oriented to place and time. Recent and remote memory intact. Attention span, concentration and fund of knowledge appropriate. Mood and affect appropriate.  Cranial Nerves: Fundoscopic exam reveals sharp disc margins. Pupils equal, briskly reactive to light. Extraocular movements full without nystagmus. Visual fields full to confrontation. Hearing intact. Facial sensation intact. Face, tongue, palate moves normally and symmetrically.  Motor: Normal bulk and tone. Normal strength in all tested extremity muscles. Sensory.: intact to touch , pinprick , position and vibratory sensation.  Coordination: Rapid alternating movements normal in all extremities. Finger-to-nose and heel-to-shin performed accurately bilaterally. Gait and Station: Arises from chair without difficulty. Stance is normal. Gait demonstrates normal stride length and balance .  Able to heel, toe and tandem walk without difficulty.  Reflexes: 1+ and symmetric. Toes downgoing.    NIHSS  0 Modified Rankin  1   Diagnostic Data (Labs, Imaging, Testing)  CT HEAD WO CONTRAST 06/27/2017 IMPRESSION: 1. Normal CT head 2. ASPECTS is 10  CT ANGIO NECK/HEAD W OR WO CONTRAST 06/27/2017 IMPRESSION: 1. 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 The distal left V4 segment and left PICA are patent and probably supplied in a retrograde fashion from the vertebrobasilar junction. 2. Progressed aortic arch and great vessel atherosclerosis since 2012 but no other hemodynamically significant stenosis in the neck. There is only minimal cervical ICA atherosclerosis. 3. Intracranial atherosclerosis has mildly progressed since 2012 with mild to moderate right ICA siphon stenosis. No other significant intracranial stenosis, and no intracranial arterial occlusion. 4. Chronic widespread cervical spine fusion. 5. Emphysema (ICD10-J43.9) and Aortic Atherosclerosis (ICD10-I70.0).  MR BRAIN WO CONTRAST 06/27/2017 IMPRESSION: 1. No acute intracranial abnormality. 2. 9 mm ovoid focus of T2/FLAIR hyperintensity within the left paramedian pons. Finding is of uncertain etiology or significance, and may be related to chronic small vessel ischemic change or a previous infectious or inflammatory process. A low-grade glioma could also conceivably have this appearance. As a result, a follow-up MRI, with and without contrast, in 3-6 months is suggested for further evaluation. 3. Otherwise normal brain MRI. 4. Mild left maxillary sinusitis.  ECHOCARDIOGRAM 06/28/2017 Study Conclusions - Left ventricle: The cavity size was normal. Wall thickness was   increased in a pattern of moderate LVH. Systolic function  was   normal. The estimated ejection fraction was in the range of 55%   to 60%. Wall motion was normal; there were no regional wall   motion abnormalities. Doppler parameters  are consistent with   abnormal left ventricular relaxation (grade 1 diastolic   dysfunction). Impressions: - Normal LV systolic function; mild diastolic dysfunction;   moderate LVH.    ASSESSMENT: BAILEY KOLBE is a 72 y.o. year old male here with TIA on 06/27/2017 secondary to unclear etiology. Vascular risk factors include HTN, HLD, DM and CAD/MI status post stent.[name]     PLAN: -Continue aspirin 81 mg daily and Eliquis (apixaban) daily  and start Repatha (d/t intolerance of previous statins tried due to myalgias) for secondary stroke prevention -F/u with PCP regarding your HLD, HTN and DM management -f/u with Dr. Alvester Chou regarding atrial  flutter and Eliquis prescribing -Encouraged to continue current diet and exercising -continue to monitor BP at home -Patient declined prescription medication for headache such as Topamax and prefer to try daily neck exercises (provided to patient on AVS) along with the use of heat or ice.  Advised patient to use Tylenol as needed for pain but to avoid use of Goody powder, Advil, Aleve or naproxen -Maintain strict control of hypertension with blood pressure goal below 130/90, diabetes with hemoglobin A1c goal below 6.5% and cholesterol with LDL cholesterol (bad cholesterol) goal below 70 mg/dL. I also advised the patient to eat a healthy diet with plenty of whole grains, cereals, fruits and vegetables, exercise regularly and maintain ideal body weight.  Follow up in 6 months or call earlier if needed   Greater than 50% of time during this 25 minute visit was spent on counseling,explanation of diagnosis of TIA, reviewing risk factor management of HLD, HTN and DM, planning of further management, discussion with patient and family and coordination of care    Venancio Poisson,  [facility]  [facility] [address] [place], [place] [address]  Phone [phone] Fax [phone]

## 2017-08-16 NOTE — Patient Instructions (Addendum)
Continue aspirin 81 mg daily and Eliquis (apixaban) daily  and start repatha  for secondary stroke prevention  We will call you in regards to starting repatha and cost regarding medication  Continue to follow up with PCP regarding cholesterol, diabetes and blood pressure management   Continue to follow up with Dr. Gwenlyn Found regarding atrial flutter and eliquis prescribing   Continue to eat healthy and stay active  Neck exercises for daily headaches (provided) also use heat or ice. Please do not use goody power, advil (ibuprofen), aleve or naproxen. Okay to use tylenol as needed for pain  Continue to monitor blood pressure at home   Maintain strict control of hypertension with blood pressure goal below 130/90, diabetes with hemoglobin A1c goal below 6.5% and cholesterol with LDL cholesterol (bad cholesterol) goal below 70 mg/dL. I also advised the patient to eat a healthy diet with plenty of whole grains, cereals, fruits and vegetables, exercise regularly and maintain ideal body weight.  Followup in the future with me in 6 months or call earlier if needed      Neck Exercises Neck exercises can be important for many reasons:  They can help you to improve and maintain flexibility in your neck. This can be especially important as you age.  They can help to make your neck stronger. This can make movement easier.  They can reduce or prevent neck pain.  They may help your upper back.  Ask your health care provider which neck exercises would be best for you. Exercises Neck Press Repeat this exercise 10 times. Do it first thing in the morning and right before bed or as told by your health care provider. 1. Lie on your back on a firm bed or on the floor with a pillow under your head. 2. Use your neck muscles to push your head down on the pillow and straighten your spine. 3. Hold the position as well as you can. Keep your head facing up and your chin tucked. 4. Slowly count to 5 while  holding this position. 5. Relax for a few seconds. Then repeat.  Isometric Strengthening Do a full set of these exercises 2 times a day or as told by your health care provider. 1. Sit in a supportive chair and place your hand on your forehead. 2. Push forward with your head and neck while pushing back with your hand. Hold for 10 seconds. 3. Relax. Then repeat the exercise 3 times. 4. Next, do thesequence again, this time putting your hand against the back of your head. Use your head and neck to push backward against the hand pressure. 5. Finally, do the same exercise on either side of your head, pushing sideways against the pressure of your hand.  Prone Head Lifts Repeat this exercise 5 times. Do this 2 times a day or as told by your health care provider. 1. Lie face-down, resting on your elbows so that your chest and upper back are raised. 2. Start with your head facing downward, near your chest. Position your chin either on or near your chest. 3. Slowly lift your head upward. Lift until you are looking straight ahead. Then continue lifting your head as far back as you can stretch. 4. Hold your head up for 5 seconds. Then slowly lower it to your starting position.  Supine Head Lifts Repeat this exercise 8-10 times. Do this 2 times a day or as told by your health care provider. 1. Lie on your back, bending your knees to point  to the ceiling and keeping your feet flat on the floor. 2. Lift your head slowly off the floor, raising your chin toward your chest. 3. Hold for 5 seconds. 4. Relax and repeat.  Scapular Retraction Repeat this exercise 5 times. Do this 2 times a day or as told by your health care provider. 1. Stand with your arms at your sides. Look straight ahead. 2. Slowly pull both shoulders backward and downward until you feel a stretch between your shoulder blades in your upper back. 3. Hold for 10-30 seconds. 4. Relax and repeat.  Contact a health care provider if:  Your  neck pain or discomfort gets much worse when you do an exercise.  Your neck pain or discomfort does not improve within 2 hours after you exercise. If you have any of these problems, stop exercising right away. Do not do the exercises again unless your health care provider says that you can. Get help right away if:  You develop sudden, severe neck pain. If this happens, stop exercising right away. Do not do the exercises again unless your health care provider says that you can. Exercises Neck Stretch  Repeat this exercise 3-5 times. 1. Do this exercise while standing or while sitting in a chair. 2. Place your feet flat on the floor, shoulder-width apart. 3. Slowly turn your head to the right. Turn it all the way to the right so you can look over your right shoulder. Do not tilt or tip your head. 4. Hold this position for 10-30 seconds. 5. Slowly turn your head to the left, to look over your left shoulder. 6. Hold this position for 10-30 seconds.  Neck Retraction Repeat this exercise 8-10 times. Do this 3-4 times a day or as told by your health care provider. 1. Do this exercise while standing or while sitting in a sturdy chair. 2. Look straight ahead. Do not bend your neck. 3. Use your fingers to push your chin backward. Do not bend your neck for this movement. Continue to face straight ahead. If you are doing the exercise properly, you will feel a slight sensation in your throat and a stretch at the back of your neck. 4. Hold the stretch for 1-2 seconds. Relax and repeat.  This information is not intended to replace advice given to you by your health care provider. Make sure you discuss any questions you have with your health care provider. Document Released: 01/20/2015 Document Revised: 07/17/2015 Document Reviewed: 08/19/2014 Elsevier Interactive Patient Education  2018 [place] you for coming to see [place] at [facility]. I hope we have been able to  provide you high quality care today.  You may receive a patient satisfaction survey over the next few weeks. We would appreciate your feedback and comments so that we may continue to improve ourselves and the health of our patients.

## 2017-08-18 NOTE — Progress Notes (Signed)
I agree with the above plan 

## 2017-10-16 ENCOUNTER — Inpatient Hospital Stay (HOSPITAL_COMMUNITY)
Admission: EM | Admit: 2017-10-16 | Discharge: 2017-10-19 | DRG: 247 | Disposition: A | Discharge status: Home / Self Care | Payer: Medicare Other | Attending: Cardiology | Admitting: Cardiology

## 2017-10-16 ENCOUNTER — Emergency Department (HOSPITAL_COMMUNITY): Payer: Medicare Other

## 2017-10-16 ENCOUNTER — Other Ambulatory Visit: Payer: Self-pay

## 2017-10-16 ENCOUNTER — Encounter (HOSPITAL_COMMUNITY): Payer: Self-pay | Admitting: Emergency Medicine

## 2017-10-16 DIAGNOSIS — E1169 Type 2 diabetes mellitus with other specified complication: Secondary | ICD-10-CM | POA: Diagnosis present

## 2017-10-16 DIAGNOSIS — E119 Type 2 diabetes mellitus without complications: Secondary | ICD-10-CM

## 2017-10-16 DIAGNOSIS — Z833 Family history of diabetes mellitus: Secondary | ICD-10-CM

## 2017-10-16 DIAGNOSIS — I10 Essential (primary) hypertension: Secondary | ICD-10-CM | POA: Diagnosis present

## 2017-10-16 DIAGNOSIS — E118 Type 2 diabetes mellitus with unspecified complications: Secondary | ICD-10-CM

## 2017-10-16 DIAGNOSIS — E1165 Type 2 diabetes mellitus with hyperglycemia: Secondary | ICD-10-CM | POA: Diagnosis present

## 2017-10-16 DIAGNOSIS — R072 Precordial pain: Secondary | ICD-10-CM

## 2017-10-16 DIAGNOSIS — I4892 Unspecified atrial flutter: Secondary | ICD-10-CM | POA: Diagnosis present

## 2017-10-16 DIAGNOSIS — Z981 Arthrodesis status: Secondary | ICD-10-CM

## 2017-10-16 DIAGNOSIS — Z7901 Long term (current) use of anticoagulants: Secondary | ICD-10-CM

## 2017-10-16 DIAGNOSIS — I252 Old myocardial infarction: Secondary | ICD-10-CM

## 2017-10-16 DIAGNOSIS — R079 Chest pain, unspecified: Secondary | ICD-10-CM | POA: Diagnosis not present

## 2017-10-16 DIAGNOSIS — Z79899 Other long term (current) drug therapy: Secondary | ICD-10-CM

## 2017-10-16 DIAGNOSIS — Z8673 Personal history of transient ischemic attack (TIA), and cerebral infarction without residual deficits: Secondary | ICD-10-CM

## 2017-10-16 DIAGNOSIS — I25110 Atherosclerotic heart disease of native coronary artery with unstable angina pectoris: Secondary | ICD-10-CM | POA: Diagnosis not present

## 2017-10-16 DIAGNOSIS — I200 Unstable angina: Secondary | ICD-10-CM | POA: Diagnosis present

## 2017-10-16 DIAGNOSIS — E785 Hyperlipidemia, unspecified: Secondary | ICD-10-CM

## 2017-10-16 DIAGNOSIS — Z7982 Long term (current) use of aspirin: Secondary | ICD-10-CM

## 2017-10-16 DIAGNOSIS — Z955 Presence of coronary angioplasty implant and graft: Secondary | ICD-10-CM

## 2017-10-16 DIAGNOSIS — J449 Chronic obstructive pulmonary disease, unspecified: Secondary | ICD-10-CM | POA: Diagnosis present

## 2017-10-16 DIAGNOSIS — Z794 Long term (current) use of insulin: Secondary | ICD-10-CM

## 2017-10-16 DIAGNOSIS — E782 Mixed hyperlipidemia: Secondary | ICD-10-CM | POA: Diagnosis present

## 2017-10-16 DIAGNOSIS — Z87891 Personal history of nicotine dependence: Secondary | ICD-10-CM

## 2017-10-16 DIAGNOSIS — N179 Acute kidney failure, unspecified: Secondary | ICD-10-CM | POA: Diagnosis present

## 2017-10-16 DIAGNOSIS — Z9114 Patient's other noncompliance with medication regimen: Secondary | ICD-10-CM

## 2017-10-16 DIAGNOSIS — Z8249 Family history of ischemic heart disease and other diseases of the circulatory system: Secondary | ICD-10-CM

## 2017-10-16 DIAGNOSIS — I4891 Unspecified atrial fibrillation: Secondary | ICD-10-CM | POA: Diagnosis present

## 2017-10-16 HISTORY — DX: Essential (primary) hypertension: I10

## 2017-10-16 HISTORY — DX: Personal history of transient ischemic attack (TIA), and cerebral infarction without residual deficits: Z86.73

## 2017-10-16 HISTORY — DX: Atherosclerotic heart disease of native coronary artery without angina pectoris: I25.10

## 2017-10-16 HISTORY — DX: Personal history of other diseases of the circulatory system: Z86.79

## 2017-10-16 HISTORY — DX: Type 2 diabetes mellitus without complications: E11.9

## 2017-10-16 HISTORY — DX: Mixed hyperlipidemia: E78.2

## 2017-10-16 LAB — BASIC METABOLIC PANEL
Anion gap: 7 (ref 5–15)
BUN: 23 mg/dL (ref 8–23)
CHLORIDE: 103 mmol/L (ref 98–111)
CO2: 28 mmol/L (ref 22–32)
CREATININE: 1.43 mg/dL — AB (ref 0.61–1.24)
Calcium: 8.6 mg/dL — ABNORMAL LOW (ref 8.9–10.3)
GFR calc Af Amer: 55 mL/min — ABNORMAL LOW (ref >60)
GFR calc non Af Amer: 48 mL/min — ABNORMAL LOW (ref >60)
Glucose, Bld: 325 mg/dL — ABNORMAL HIGH (ref 70–99)
POTASSIUM: 4.5 mmol/L (ref 3.5–5.1)
Sodium: 138 mmol/L (ref 135–145)

## 2017-10-16 LAB — CBC
HCT: 36.6 % — ABNORMAL LOW (ref 39.0–52.0)
Hemoglobin: 12 g/dL — ABNORMAL LOW (ref 13.0–17.0)
MCH: 28.4 pg (ref 26.0–34.0)
MCHC: 32.8 g/dL (ref 30.0–36.0)
MCV: 86.5 fL (ref 78.0–100.0)
Platelets: 163 10*3/uL (ref 150–400)
RBC: 4.23 MIL/uL (ref 4.22–5.81)
RDW: 14.2 % (ref 11.5–15.5)
WBC: 6.5 10*3/uL (ref 4.0–10.5)

## 2017-10-16 LAB — TROPONIN I: Troponin I: <0.03 ng/mL (ref <0.03)

## 2017-10-16 MED ORDER — ALBUTEROL SULFATE (2.5 MG/3ML) 0.083% IN NEBU: 2.5000 mg | INHALATION_SOLUTION | RESPIRATORY_TRACT | Status: DC | PRN

## 2017-10-16 MED ORDER — ONDANSETRON HCL 4 MG PO TABS: 4.0000 mg | ORAL_TABLET | Freq: Four times a day (QID) | ORAL | Status: DC | PRN

## 2017-10-16 MED ORDER — SODIUM CHLORIDE 0.9 % IV SOLN: 250.0000 mL | INTRAVENOUS | Status: DC | PRN

## 2017-10-16 MED ORDER — ACETAMINOPHEN 500 MG PO TABS
1000.0000 mg | ORAL_TABLET | Freq: Once | ORAL | Status: AC
  Administered 2017-10-16: 1000 mg via ORAL
  Filled 2017-10-16: qty 2

## 2017-10-16 MED ORDER — SODIUM CHLORIDE 0.9 % IV SOLN
INTRAVENOUS | Status: AC
  Administered 2017-10-17: via INTRAVENOUS

## 2017-10-16 MED ORDER — POLYETHYLENE GLYCOL 3350 17 G PO PACK: 17.0000 g | PACK | Freq: Every day | ORAL | Status: DC | PRN

## 2017-10-16 MED ORDER — HEPARIN SODIUM (PORCINE) 5000 UNIT/ML IJ SOLN
5000.0000 [IU] | Freq: Three times a day (TID) | INTRAMUSCULAR | Status: DC
  Administered 2017-10-16 – 2017-10-17 (×2): 5000 [IU] via SUBCUTANEOUS
  Filled 2017-10-16 (×2): qty 1

## 2017-10-16 MED ORDER — INSULIN ASPART 100 UNIT/ML ~~LOC~~ SOLN
0.0000 [IU] | Freq: Three times a day (TID) | SUBCUTANEOUS | Status: DC
  Administered 2017-10-17: 3 [IU] via SUBCUTANEOUS
  Administered 2017-10-17: 2 [IU] via SUBCUTANEOUS

## 2017-10-16 MED ORDER — METOPROLOL TARTRATE 50 MG PO TABS
100.0000 mg | ORAL_TABLET | Freq: Every day | ORAL | Status: DC
  Administered 2017-10-17 – 2017-10-18 (×2): 100 mg via ORAL
  Filled 2017-10-16 (×2): qty 2
  Filled 2017-10-16: qty 1

## 2017-10-16 MED ORDER — NITROGLYCERIN 0.4 MG SL SUBL: 0.4000 mg | SUBLINGUAL_TABLET | SUBLINGUAL | Status: DC | PRN

## 2017-10-16 MED ORDER — ASPIRIN 325 MG PO TABS
325.0000 mg | ORAL_TABLET | Freq: Every day | ORAL | Status: DC
  Administered 2017-10-17: 325 mg via ORAL
  Filled 2017-10-16 (×2): qty 1

## 2017-10-16 MED ORDER — ACETAMINOPHEN 325 MG PO TABS
650.0000 mg | ORAL_TABLET | Freq: Four times a day (QID) | ORAL | Status: DC | PRN
  Administered 2017-10-16 – 2017-10-17 (×2): 650 mg via ORAL
  Filled 2017-10-16 (×2): qty 2

## 2017-10-16 MED ORDER — SODIUM CHLORIDE 0.9% FLUSH
3.0000 mL | Freq: Two times a day (BID) | INTRAVENOUS | Status: DC
  Administered 2017-10-17 – 2017-10-18 (×3): 3 mL via INTRAVENOUS

## 2017-10-16 MED ORDER — ACETAMINOPHEN 650 MG RE SUPP: 650.0000 mg | Freq: Four times a day (QID) | RECTAL | Status: DC | PRN

## 2017-10-16 MED ORDER — ONDANSETRON HCL 4 MG/2ML IJ SOLN: 4.0000 mg | Freq: Four times a day (QID) | INTRAMUSCULAR | Status: DC | PRN

## 2017-10-16 MED ORDER — SODIUM CHLORIDE 0.9% FLUSH: 3.0000 mL | INTRAVENOUS | Status: DC | PRN

## 2017-10-16 MED ORDER — TRAZODONE HCL 50 MG PO TABS
50.0000 mg | ORAL_TABLET | Freq: Every evening | ORAL | Status: DC | PRN
  Administered 2017-10-16 – 2017-10-17 (×2): 50 mg via ORAL
  Filled 2017-10-16 (×2): qty 1

## 2017-10-16 MED ORDER — NITROGLYCERIN 2 % TD OINT
0.5000 [in_us] | TOPICAL_OINTMENT | Freq: Once | TRANSDERMAL | Status: AC
  Administered 2017-10-16: 0.5 [in_us] via TOPICAL
  Filled 2017-10-16: qty 1

## 2017-10-16 NOTE — ED Notes (Signed)
hospitalist in to assess  

## 2017-10-16 NOTE — ED Notes (Signed)
Call for report  N are in report and scattered per person answering the phone  Unable to give report at this time

## 2017-10-16 NOTE — ED Notes (Signed)
Pt took nitro prior to arrival and is now starting to have a severe headache.

## 2017-10-16 NOTE — ED Provider Notes (Addendum)
[facility] EMERGENCY DEPARTMENT Provider Note   CSN: [id number] Arrival date & time: 10/16/17  1600     History   Chief Complaint Chief Complaint  Patient presents with  . Chest Pain    HPI Richard [name] is a 72 y.o. male.  Patient with a known history of coronary disease.  Status post stents x3 in 2006.  Patient also with a history of a TIA in May with extensive hospitalization and work-up.  Patient takes aspirin twice a day because he did not want to go on Eliquis.  Patient occasionally gets anterior chest pain that radiates to his jaw his neck area and to the back of his head.  However today he had 3 episodes that occurred about 15 minutes apart.  Each episode lasted less than 2 minutes.  After the third episode which was intense patient took his nitroglycerin which increased headache but the pain has resolved and not returned since then.  The initial episode of pain happened around 3 PM.  Associated with some shortness of breath just when the episodes were there no nausea.  The chest pain part was substernal.  It did not radiate to the back.     Past Medical History:  Diagnosis Date  . Diabetes mellitus   . HLD (hyperlipidemia)   . Hypertension   . MI (myocardial infarction) ([place])    cardiac stents x2  . Pneumonia   . Stroke [facility])    tia    Patient Active Problem List   Diagnosis Date Noted  . Atrial flutter ([place])   . TIA (transient ischemic attack) 06/27/2017  . Hypertension 11/14/2012  . RLL pneumonia ([place]) 11/14/2012  . Diabetes ([place]) 11/14/2012  . CAD (coronary artery disease) 11/14/2012  . HIATAL HERNIA 11/24/1983    Past Surgical History:  Procedure Laterality Date  . BALLOON ANGIOPLASTY, ARTERY    . CARDIAC ELECTROPHYSIOLOGY MAPPING AND ABLATION    . CARPAL TUNNEL RELEASE Bilateral   . cervical neck fusion     x 4  . CORONARY STENT PLACEMENT     x 2  . ROTATOR CUFF REPAIR Right    x 4  . TIBIA FRACTURE SURGERY Left         Home Medications     Prior to Admission medications   Medication Sig Start Date End Date Taking? Authorizing Provider  aspirin 325 MG tablet Take by mouth 2 (two) times daily.    Yes [provider]  glipiZIDE (GLUCOTROL) 10 MG tablet Take 10 mg by mouth daily.    Yes [provider]  glucose blood (ONE TOUCH ULTRA TEST) test strip 1 each. 10/02/13  Yes [provider]  metFORMIN (GLUCOPHAGE) 1000 MG tablet Take 1,000 mg by mouth 2 (two) times daily.  06/23/17  Yes [provider]  metoprolol (LOPRESSOR) 100 MG tablet Take 100 mg by mouth daily. 10/07/12  Yes [provider]  nitroGLYCERIN (NITROSTAT) 0.4 MG SL tablet PLACE ONE TABLET (0.4 MG DOSE) UNDER THE TONGUE EVERY 5 (FIVE) MINUTES AS NEEDED FOR CHEST PAIN. 06/27/17  Yes [provider]  pioglitazone (ACTOS) 15 MG tablet Take 15 mg by mouth daily.  07/26/17  Yes [provider]  apixaban (ELIQUIS) 5 MG TABS tablet Take 1 tablet (5 mg total) by mouth 2 (two) times daily. 06/29/17   Geradine Girt, DO  Evolocumab (REPATHA) 140 MG/ML SOSY Inject 140 mg into the skin every 14 (fourteen) days. Patient not taking: Reported on 10/16/2017 08/16/17  Venancio Poisson, NP    Family History Family History  Problem Relation Age of Onset  . Diabetes Mother   . Heart disease Mother   . Heart disease Brother   . Emphysema Father   . Lung cancer Brother        x 2  . Colon cancer Neg Hx   . Esophageal cancer Neg Hx   . Rectal cancer Neg Hx   . Stomach cancer Neg Hx   . Prostate cancer Neg Hx     Social History Social History   Tobacco Use  . Smoking status: Former Smoker    Packs/day: 1.00    Years: 54.00    Pack years: 54.00    Types: Cigarettes    Last attempt to quit: 07/11/2017    Years since quitting: 0.2  . Smokeless tobacco: Never Used  Substance Use Topics  . Alcohol use: No    Alcohol/week: 0.0 standard drinks    Comment: rare  . Drug use: No     Allergies   Plavix [clopidogrel]  and Statins   Review of Systems Review of Systems  Constitutional: Negative for fever.  HENT: Negative for congestion.   Eyes: Negative for visual disturbance.  Respiratory: Positive for shortness of breath.   Cardiovascular: Positive for chest pain.  Gastrointestinal: Negative for abdominal pain and nausea.  Genitourinary: Negative for dysuria.  Musculoskeletal: Positive for neck pain.  Skin: Negative for rash.  Neurological: Positive for headaches.  Hematological: Does not bruise/bleed easily.  Psychiatric/Behavioral: Negative for confusion.     Physical Exam Updated Vital Signs BP (!) 165/81   Pulse 90   Resp 17   Ht 1.829 m (6')   Wt 86.2 kg   SpO2 97%   BMI 25.77 kg/m   Physical Exam  Constitutional: He is oriented to person, place, and time. He appears well-developed and well-nourished. No distress.  HENT:  Head: Normocephalic and atraumatic.  Mouth/Throat: Oropharynx is clear and moist.  Eyes: Pupils are equal, round, and reactive to light. Conjunctivae and EOM are normal.  Neck: Neck supple.  Cardiovascular: Normal rate, regular rhythm and normal heart sounds.  Pulmonary/Chest: Effort normal and breath sounds normal. No respiratory distress.  Abdominal: Soft. Bowel sounds are normal. There is no tenderness.  Musculoskeletal: Normal range of motion. He exhibits no edema.  Neurological: He is alert and oriented to person, place, and time. No cranial nerve deficit or sensory deficit. He exhibits normal muscle tone. Coordination normal.  Skin: Skin is warm.  Nursing note and vitals reviewed.    ED Treatments / Results  Labs (all labs ordered are listed, but only abnormal results are displayed) Labs Reviewed  BASIC METABOLIC PANEL - Abnormal; Notable for the following components:      Result Value   Glucose, Bld 325 (*)    Creatinine, Ser 1.43 (*)    Calcium 8.6 (*)    GFR calc non Af Amer 48 (*)    GFR calc Af Amer 55 (*)    All other components within  normal limits  CBC - Abnormal; Notable for the following components:   Hemoglobin 12.0 (*)    HCT 36.6 (*)    All other components within normal limits  TROPONIN I    EKG EKG Interpretation  Date/Time:  Sunday October 16 2017 16:04:35 EDT Ventricular Rate:  94 PR Interval:    QRS Duration: 90 QT Interval:  357 QTC Calculation: 447 R Axis:   -5 Text Interpretation:  Sinus rhythm  Anteroseptal infarct, age indeterminate No significant change since last tracing Reconfirmed by Fredia Sorrow [phone]) on 10/16/2017 5:11:50 PM   Radiology Dg Chest 2 View  Result Date: 10/16/2017 CLINICAL DATA:  Chest copper, chest pain. EXAM: CHEST - 2 VIEW COMPARISON:  Chest x-rays dated 06/28/2017 and 10/22/2013. FINDINGS: The heart size and mediastinal contours are within normal limits. Both lungs are clear. The visualized skeletal structures are unremarkable. IMPRESSION: No active cardiopulmonary disease. No evidence of pneumonia or pulmonary edema. Electronically Signed   By: Franki Cabot M.D.   On: 10/16/2017 17:04    Procedures Procedures (including critical care time)  Medications Ordered in ED Medications  acetaminophen (TYLENOL) tablet 1,000 mg (has no administration in time range)  nitroGLYCERIN (NITROGLYN) 2 % ointment 0.5 inch (has no administration in time range)     Initial Impression / Assessment and Plan / ED Course  I have reviewed the triage vital signs and the nursing notes.  Pertinent labs & imaging results that were available during my care of the patient were reviewed by me and considered in my medical decision making (see chart for details).     Patient symptoms concerning for recurrent chest pain and a brief period of time that could be unstable angina type picture.  Patient has not had any of the episodes here.  Patient started on nitroglycerin paste.  Given aspirin for his nitroglycerin headache.  No neuro focal deficits.  Clinically he did have shortness of breath with  it but only had shortness of breath while the episodes were occurring.  And these episodes are identical to episodes he has had in the past she just never had them quite this intense or occurring this close together.  Patient's cardiologist is Dr. Caryl Comes but review of chart makes it appear as if he has not seen Dr. Caryl Comes for some period of time.  Patient has cardiac risk factors and that he is also diabetic.  Hyperlipidemia.  And hypertension.  And recently quit smoking.  Final Clinical Impressions(s) / ED Diagnoses   Final diagnoses:  Precordial pain    ED Discharge Orders    None       Fredia Sorrow, MD 10/16/17 1829  Clinically do not think this is consistent with a pulmonary embolus.  Also since patient has not had any recurrent episodes on the Nitropaste have not started heparin.  Because these episodes all lasted less than 2 minutes.  However it is concerning for an unstable angina type pattern.  Discussed with hospitalist who will see and evaluate for admission.   Further chart review shows that he was recommended to be on Eliquis during the admission for the TIA on May 6 however patient states he is not taking that.  Patient also has a past history of atrial flutter but had ablation done in 2003.  EKG here without atrial fibrillation letter.  Patient states he had an echocardiogram during his work-up for the TIA.  Without any significant findings.    Fredia Sorrow, MD 10/16/17 [phone]

## 2017-10-16 NOTE — ED Notes (Signed)
Report to Christy, RN

## 2017-10-16 NOTE — ED Notes (Signed)
Meal provided 

## 2017-10-16 NOTE — ED Triage Notes (Signed)
Pt states he has been having "episodes" of tightness in his chest and a heat sensation going over him.  Has been occurring for about a year.  Was dx with TIA a week ago.  Pt also has severe pain in the back of his head when this occurs and then symptoms resolve.

## 2017-10-16 NOTE — H&P (Signed)
Patient Demographics:    Richard [name], is a 72 y.o. male  MRN: [id number]   DOB - 02/22/1946  Admit Date - 10/16/2017  Outpatient Primary MD for the patient is Dione Housekeeper, MD   Assessment & Plan:    Active Problems:   Chest pain    1)Atypical Chest Pain-patient with history of CAD with prior angioplasty and stent placement in 2006,  Place in Observation status on  telemetry monitored unit, check serial troponins and EKG to rule out acute coronary syndrome .  If patient rules out for ACS, will need further cardiovascular risk stratification . Cardiology consult to help decide if Stress test is needed in am Versus other diagnostic modalities.   Give aspirin, nitroglycerin, and metoprolol 100 mg daily , patient apparently is intolerant to statins.  Echo from May 2019 with EF in the 55 to 60% range with moderate LVH   2)DM2--poorly controlled, last A1c 10.6,.. Hold glipizide metformin and Actos as patient will be n.p.o. overnight for possible cardiovascular testing in a.m.Marland KitchenMarland Kitchen Allow some permissive Hyperglycemia rather than risk life-threatening hypoglycemia in a patient with unreliable oral intake (NPO overnight). Use Novolog/Humalog Sliding scale insulin with Accu-Cheks/Fingersticks as ordered  3)H/o Tia --patient was diagnosed with a TIA in May 2019, apparently is unable to afford Repatha, he is also unable to afford Eliquis.  Social work/case management consult for medication assistance  4)H/o Atrial Flutter--prior Ablation in 2008, continue metoprolol, watch for arrhythmias on telemetry  5)AKI----acute kidney injury -     baseline creatinine usually around 1.1, creatinine is now up to 1.43, renally adjust medications, avoid nephrotoxic agents/dehydration/hypotension     With History of - Reviewed by me  Past  Medical History:  Diagnosis Date  . Diabetes mellitus   . HLD (hyperlipidemia)   . Hypertension   . MI (myocardial infarction) ([place])    cardiac stents x2  . Pneumonia   . Stroke [facility])    tia      Past Surgical History:  Procedure Laterality Date  . BALLOON ANGIOPLASTY, ARTERY    . CARDIAC ELECTROPHYSIOLOGY MAPPING AND ABLATION    . CARPAL TUNNEL RELEASE Bilateral   . cervical neck fusion     x 4  . CORONARY STENT PLACEMENT     x 2  . ROTATOR CUFF REPAIR Right    x 4  . TIBIA FRACTURE SURGERY Left       Chief Complaint  Patient presents with  . Chest Pain      HPI:    Richard [name]  is a 72 y.o. male with past medical history relevant for CAD with status post previous stents in 2006, as well as history of atrial fibrillation status post ablation on 09/2006 and history of recent TIA in May 2019 who presents with recurrent episodes of chest pains that started around 3 PM today.  Patient described the chest discomfort as dull radiates to his jaw and neck area as  well as to the back of his head associated with occasional dizziness.  Patient denies palpitations or syncope.  Chest discomfort occurred 3 times today patient finally took nitroglycerin which helped the substernal chest discomfort but made his headache worse  No leg pains, no leg swelling, no pleuritic symptoms.... Had nausea but no emesis, and no diarrhea  Additional history obtained from patient's wife and daughter at bedside     Review of systems:    In addition to the HPI above,   A full Review of  Systems was done, all other systems reviewed are negative except as noted above in HPI , .    Social History:  Reviewed by me    Social History   Tobacco Use  . Smoking status: Former Smoker    Packs/day: 1.00    Years: 54.00    Pack years: 54.00    Types: Cigarettes    Last attempt to quit: 07/11/2017    Years since quitting: 0.2  . Smokeless tobacco: Never Used  Substance Use Topics  . Alcohol  use: No    Alcohol/week: 0.0 standard drinks    Comment: rare       Family History :  Reviewed by me    Family History  Problem Relation Age of Onset  . Diabetes Mother   . Heart disease Mother   . Heart disease Brother   . Emphysema Father   . Lung cancer Brother        x 2  . Colon cancer Neg Hx   . Esophageal cancer Neg Hx   . Rectal cancer Neg Hx   . Stomach cancer Neg Hx   . Prostate cancer Neg Hx      Home Medications:   Prior to Admission medications   Medication Sig Start Date End Date Taking? Authorizing Provider  aspirin 325 MG tablet Take by mouth 2 (two) times daily.    Yes [provider]  glipiZIDE (GLUCOTROL) 10 MG tablet Take 10 mg by mouth daily.    Yes [provider]  glucose blood (ONE TOUCH ULTRA TEST) test strip 1 each. 10/02/13  Yes [provider]  metFORMIN (GLUCOPHAGE) 1000 MG tablet Take 1,000 mg by mouth 2 (two) times daily.  06/23/17  Yes [provider]  metoprolol (LOPRESSOR) 100 MG tablet Take 100 mg by mouth daily. 10/07/12  Yes [provider]  nitroGLYCERIN (NITROSTAT) 0.4 MG SL tablet PLACE ONE TABLET (0.4 MG DOSE) UNDER THE TONGUE EVERY 5 (FIVE) MINUTES AS NEEDED FOR CHEST PAIN. 06/27/17  Yes [provider]  pioglitazone (ACTOS) 15 MG tablet Take 15 mg by mouth daily.  07/26/17  Yes [provider]  apixaban (ELIQUIS) 5 MG TABS tablet Take 1 tablet (5 mg total) by mouth 2 (two) times daily. 06/29/17   Geradine Girt, DO  Evolocumab (REPATHA) 140 MG/ML SOSY Inject 140 mg into the skin every 14 (fourteen) days. Patient not taking: Reported on 10/16/2017 08/16/17   Venancio Poisson, NP     Allergies:     Allergies  Allergen Reactions  . Plavix [Clopidogrel]     Leg pain  . Statins     Leg pain     Physical Exam:   Vitals  Blood pressure (!) 175/85, pulse 69, temperature 97.7 F (36.5 C), temperature source Oral, resp. rate 17, height 6' (1.829 m), weight 86 kg, SpO2 98  %.  Physical Examination: General appearance - alert, well appearing, and in no distress Mental status -  alert, oriented to person, place, and time, Eyes - sclera anicteric Neck - supple, no JVD elevation , Chest - clear  to auscultation bilaterally, symmetrical air movement,  Heart - S1 and S2 normal, regular Abdomen - soft, nontender, nondistended, no masses or organomegaly Neurological - screening mental status exam normal, neck supple without rigidity, cranial nerves II through XII intact, DTR's normal and symmetric Extremities - no pedal edema noted, intact peripheral pulses  Skin - warm, dry     Data Review:    CBC Recent Labs  Lab 10/16/17 1623  WBC 6.5  HGB 12.0*  HCT 36.6*  PLT 163  MCV 86.5  MCH 28.4  MCHC 32.8  RDW 14.2   ------------------------------------------------------------------------------------------------------------------  Chemistries  Recent Labs  Lab 10/16/17 1623  NA 138  K 4.5  CL 103  CO2 28  GLUCOSE 325*  BUN 23  CREATININE 1.43*  CALCIUM 8.6*   ------------------------------------------------------------------------------------------------------------------ estimated creatinine clearance is 52 mL/min (A) (by C-G formula based on SCr of 1.43 mg/dL (H)). ------------------------------------------------------------------------------------------------------------------ No results for input(s): TSH, T4TOTAL, T3FREE, THYROIDAB in the last 72 hours.  Invalid input(s): FREET3   Coagulation profile No results for input(s): INR, PROTIME in the last 168 hours. ------------------------------------------------------------------------------------------------------------------- No results for input(s): DDIMER in the last 72 hours. -------------------------------------------------------------------------------------------------------------------  Cardiac Enzymes Recent Labs  Lab 10/16/17 1623  TROPONINI <0.03    ------------------------------------------------------------------------------------------------------------------ No results found for: BNP   ---------------------------------------------------------------------------------------------------------------  Urinalysis    Component Value Date/Time   COLORURINE YELLOW 01/04/2007 1000   APPEARANCEUR CLEAR 01/04/2007 1000   LABSPEC 1.022 01/04/2007 1000   PHURINE 5.5 01/04/2007 1000   GLUCOSEU >1000 (A) 01/04/2007 1000   HGBUR NEGATIVE 01/04/2007 1000   BILIRUBINUR NEGATIVE 01/04/2007 1000   KETONESUR NEGATIVE 01/04/2007 1000   PROTEINUR NEGATIVE 01/04/2007 1000   UROBILINOGEN 0.2 01/04/2007 1000   NITRITE NEGATIVE 01/04/2007 1000   LEUKOCYTESUR NEGATIVE 01/04/2007 1000   ---------------------------------------------------------------------------------------------------------------   Imaging Results:    Dg Chest 2 View  Result Date: 10/16/2017 CLINICAL DATA:  Chest copper, chest pain. EXAM: CHEST - 2 VIEW COMPARISON:  Chest x-rays dated 06/28/2017 and 10/22/2013. FINDINGS: The heart size and mediastinal contours are within normal limits. Both lungs are clear. The visualized skeletal structures are unremarkable. IMPRESSION: No active cardiopulmonary disease. No evidence of pneumonia or pulmonary edema. Electronically Signed   By: Franki Cabot M.D.   On: 10/16/2017 17:04    Radiological Exams on Admission: Dg Chest 2 View  Result Date: 10/16/2017 CLINICAL DATA:  Chest copper, chest pain. EXAM: CHEST - 2 VIEW COMPARISON:  Chest x-rays dated 06/28/2017 and 10/22/2013. FINDINGS: The heart size and mediastinal contours are within normal limits. Both lungs are clear. The visualized skeletal structures are unremarkable. IMPRESSION: No active cardiopulmonary disease. No evidence of pneumonia or pulmonary edema. Electronically Signed   By: Franki Cabot M.D.   On: 10/16/2017 17:04    DVT Prophylaxis -SCD/Heparin AM Labs Ordered, also  please review Full Orders  Family Communication: Admission, patients condition and plan of care including tests being ordered have been discussed with the patient and wife, daughter at bedside who indicate understanding and agree with the plan   Code Status - Full Code  Likely DC to  home  Condition   stable  Roxan Hockey M.D on 10/16/2017 at 10:35 PM Pager---[phone] Go to [email or web address] - password [email or web address] for contact info  [facility] - Office  [phone]

## 2017-10-16 NOTE — ED Notes (Signed)
To rad 

## 2017-10-17 ENCOUNTER — Encounter (HOSPITAL_COMMUNITY): Payer: Self-pay | Admitting: Cardiology

## 2017-10-17 ENCOUNTER — Inpatient Hospital Stay (HOSPITAL_COMMUNITY)
Admission: EM | Disposition: A | Discharge status: Home / Self Care | Payer: Self-pay | Source: Home / Self Care | Attending: Cardiology

## 2017-10-17 DIAGNOSIS — I200 Unstable angina: Secondary | ICD-10-CM | POA: Diagnosis present

## 2017-10-17 DIAGNOSIS — E1165 Type 2 diabetes mellitus with hyperglycemia: Secondary | ICD-10-CM

## 2017-10-17 DIAGNOSIS — E782 Mixed hyperlipidemia: Secondary | ICD-10-CM

## 2017-10-17 DIAGNOSIS — I25110 Atherosclerotic heart disease of native coronary artery with unstable angina pectoris: Secondary | ICD-10-CM | POA: Diagnosis not present

## 2017-10-17 DIAGNOSIS — Z8679 Personal history of other diseases of the circulatory system: Secondary | ICD-10-CM

## 2017-10-17 DIAGNOSIS — Z789 Other specified health status: Secondary | ICD-10-CM

## 2017-10-17 DIAGNOSIS — R072 Precordial pain: Secondary | ICD-10-CM

## 2017-10-17 HISTORY — PX: CORONARY STENT INTERVENTION: CATH118234

## 2017-10-17 HISTORY — PX: LEFT HEART CATH AND CORONARY ANGIOGRAPHY: CATH118249

## 2017-10-17 LAB — CBC
HEMATOCRIT: 37.4 % — AB (ref 39.0–52.0)
HEMOGLOBIN: 12.5 g/dL — AB (ref 13.0–17.0)
MCH: 29.1 pg (ref 26.0–34.0)
MCHC: 33.4 g/dL (ref 30.0–36.0)
MCV: 87 fL (ref 78.0–100.0)
Platelets: 137 10*3/uL — ABNORMAL LOW (ref 150–400)
RBC: 4.3 MIL/uL (ref 4.22–5.81)
RDW: 14.4 % (ref 11.5–15.5)
WBC: 5.6 10*3/uL (ref 4.0–10.5)

## 2017-10-17 LAB — TROPONIN I

## 2017-10-17 LAB — GLUCOSE, CAPILLARY
GLUCOSE-CAPILLARY: 242 mg/dL — AB (ref 70–99)
GLUCOSE-CAPILLARY: 173 mg/dL — AB (ref 70–99)
Glucose-Capillary: 289 mg/dL — ABNORMAL HIGH (ref 70–99)
Glucose-Capillary: 161 mg/dL — ABNORMAL HIGH (ref 70–99)
Glucose-Capillary: 116 mg/dL — ABNORMAL HIGH (ref 70–99)
Glucose-Capillary: 309 mg/dL — ABNORMAL HIGH (ref 70–99)

## 2017-10-17 LAB — POCT ACTIVATED CLOTTING TIME
ACTIVATED CLOTTING TIME: 246 s
ACTIVATED CLOTTING TIME: 367 s

## 2017-10-17 LAB — BASIC METABOLIC PANEL
ANION GAP: 7 (ref 5–15)
BUN: 21 mg/dL (ref 8–23)
CALCIUM: 8.7 mg/dL — AB (ref 8.9–10.3)
CO2: 26 mmol/L (ref 22–32)
Chloride: 106 mmol/L (ref 98–111)
Creatinine, Ser: 1.04 mg/dL (ref 0.61–1.24)
GLUCOSE: 273 mg/dL — AB (ref 70–99)
POTASSIUM: 4.2 mmol/L (ref 3.5–5.1)
SODIUM: 139 mmol/L (ref 135–145)

## 2017-10-17 LAB — MRSA PCR SCREENING: MRSA by PCR: NEGATIVE

## 2017-10-17 LAB — APTT: aPTT: 28 seconds (ref 24–36)

## 2017-10-17 LAB — HEPARIN LEVEL (UNFRACTIONATED): Heparin Unfractionated: <0.1 IU/mL — ABNORMAL LOW (ref 0.30–0.70)

## 2017-10-17 SURGERY — LEFT HEART CATH AND CORONARY ANGIOGRAPHY
Anesthesia: LOCAL

## 2017-10-17 MED ORDER — FENTANYL CITRATE (PF) 100 MCG/2ML IJ SOLN
INTRAMUSCULAR | Status: DC | PRN
  Administered 2017-10-17 (×2): 25 ug via INTRAVENOUS

## 2017-10-17 MED ORDER — VERAPAMIL HCL 2.5 MG/ML IV SOLN
INTRAVENOUS | Status: AC
  Filled 2017-10-17: qty 2

## 2017-10-17 MED ORDER — HEPARIN (PORCINE) IN NACL 1000-0.9 UT/500ML-% IV SOLN
INTRAVENOUS | Status: AC
  Filled 2017-10-17: qty 1000

## 2017-10-17 MED ORDER — HYDRALAZINE HCL 20 MG/ML IJ SOLN
INTRAMUSCULAR | Status: AC
  Filled 2017-10-17: qty 1

## 2017-10-17 MED ORDER — FENTANYL CITRATE (PF) 100 MCG/2ML IJ SOLN
INTRAMUSCULAR | Status: AC
  Filled 2017-10-17: qty 2

## 2017-10-17 MED ORDER — TICAGRELOR 90 MG PO TABS
ORAL_TABLET | ORAL | Status: DC | PRN
  Administered 2017-10-17: 180 mg via ORAL

## 2017-10-17 MED ORDER — SODIUM CHLORIDE 0.9% FLUSH: 3.0000 mL | INTRAVENOUS | Status: DC | PRN

## 2017-10-17 MED ORDER — IOPAMIDOL (ISOVUE-370) INJECTION 76%
INTRAVENOUS | Status: DC | PRN
  Administered 2017-10-17: 140 mL via INTRA_ARTERIAL

## 2017-10-17 MED ORDER — IOPAMIDOL (ISOVUE-370) INJECTION 76%
INTRAVENOUS | Status: AC
  Filled 2017-10-17: qty 100

## 2017-10-17 MED ORDER — HYDRALAZINE HCL 20 MG/ML IJ SOLN
INTRAMUSCULAR | Status: DC | PRN
  Administered 2017-10-17 (×2): 5 mg via INTRAVENOUS

## 2017-10-17 MED ORDER — MORPHINE SULFATE (PF) 2 MG/ML IV SOLN
2.0000 mg | INTRAVENOUS | Status: AC | PRN
  Administered 2017-10-17 – 2017-10-18 (×3): 2 mg via INTRAVENOUS
  Filled 2017-10-17 (×3): qty 1

## 2017-10-17 MED ORDER — MIDAZOLAM HCL 2 MG/2ML IJ SOLN
INTRAMUSCULAR | Status: AC
  Filled 2017-10-17: qty 2

## 2017-10-17 MED ORDER — INSULIN ASPART 100 UNIT/ML ~~LOC~~ SOLN
0.0000 [IU] | Freq: Three times a day (TID) | SUBCUTANEOUS | Status: DC
  Administered 2017-10-18 (×2): 3 [IU] via SUBCUTANEOUS
  Administered 2017-10-18: 5 [IU] via SUBCUTANEOUS
  Administered 2017-10-18 (×2): 7 [IU] via SUBCUTANEOUS
  Administered 2017-10-19: 5 [IU] via SUBCUTANEOUS
  Administered 2017-10-19: 3 [IU] via SUBCUTANEOUS

## 2017-10-17 MED ORDER — HYDRALAZINE HCL 20 MG/ML IJ SOLN: 5.0000 mg | INTRAMUSCULAR | Status: AC | PRN

## 2017-10-17 MED ORDER — SODIUM CHLORIDE 0.9 % WEIGHT BASED INFUSION: 3.0000 mL/kg/h | INTRAVENOUS | Status: DC

## 2017-10-17 MED ORDER — HEPARIN SODIUM (PORCINE) 1000 UNIT/ML IJ SOLN
INTRAMUSCULAR | Status: AC
  Filled 2017-10-17: qty 1

## 2017-10-17 MED ORDER — SODIUM CHLORIDE 0.9 % WEIGHT BASED INFUSION
1.0000 mL/kg/h | INTRAVENOUS | Status: AC
  Administered 2017-10-17: 1 mL/kg/h via INTRAVENOUS

## 2017-10-17 MED ORDER — LABETALOL HCL 5 MG/ML IV SOLN
10.0000 mg | INTRAVENOUS | Status: AC | PRN
  Administered 2017-10-17 (×2): 10 mg via INTRAVENOUS
  Filled 2017-10-17: qty 4

## 2017-10-17 MED ORDER — HYDRALAZINE HCL 50 MG PO TABS
25.0000 mg | ORAL_TABLET | Freq: Four times a day (QID) | ORAL | Status: DC | PRN
  Administered 2017-10-17 (×2): 25 mg via ORAL
  Filled 2017-10-17 (×2): qty 1

## 2017-10-17 MED ORDER — HEPARIN (PORCINE) IN NACL 100-0.45 UNIT/ML-% IJ SOLN
1000.0000 [IU]/h | INTRAMUSCULAR | Status: DC
  Administered 2017-10-17: 1000 [IU]/h via INTRAVENOUS
  Filled 2017-10-17: qty 250

## 2017-10-17 MED ORDER — SODIUM CHLORIDE 0.9 % WEIGHT BASED INFUSION: 1.0000 mL/kg/h | INTRAVENOUS | Status: DC

## 2017-10-17 MED ORDER — ASPIRIN 81 MG PO CHEW: 81.0000 mg | CHEWABLE_TABLET | ORAL | Status: DC

## 2017-10-17 MED ORDER — SODIUM CHLORIDE 0.9 % IV BOLUS
250.0000 mL | Freq: Once | INTRAVENOUS | Status: AC
  Administered 2017-10-17: 250 mL via INTRAVENOUS

## 2017-10-17 MED ORDER — FENTANYL CITRATE (PF) 100 MCG/2ML IJ SOLN
50.0000 ug | Freq: Once | INTRAMUSCULAR | Status: AC
  Administered 2017-10-17: 50 ug via INTRAVENOUS

## 2017-10-17 MED ORDER — TICAGRELOR 90 MG PO TABS
90.0000 mg | ORAL_TABLET | Freq: Two times a day (BID) | ORAL | Status: DC
  Administered 2017-10-18 – 2017-10-19 (×3): 90 mg via ORAL
  Filled 2017-10-17 (×3): qty 1

## 2017-10-17 MED ORDER — LIDOCAINE HCL (PF) 1 % IJ SOLN
INTRAMUSCULAR | Status: AC
  Filled 2017-10-17: qty 30

## 2017-10-17 MED ORDER — SODIUM CHLORIDE 0.9 % IV SOLN: 250.0000 mL | INTRAVENOUS | Status: DC | PRN

## 2017-10-17 MED ORDER — SODIUM CHLORIDE 0.9% FLUSH
3.0000 mL | Freq: Two times a day (BID) | INTRAVENOUS | Status: DC
  Administered 2017-10-17: 3 mL via INTRAVENOUS

## 2017-10-17 MED ORDER — INSULIN ASPART 100 UNIT/ML ~~LOC~~ SOLN: 0.0000 [IU] | Freq: Three times a day (TID) | SUBCUTANEOUS | Status: DC

## 2017-10-17 MED ORDER — MIDAZOLAM HCL 2 MG/2ML IJ SOLN
INTRAMUSCULAR | Status: DC | PRN
  Administered 2017-10-17 (×2): 1 mg via INTRAVENOUS

## 2017-10-17 MED ORDER — LIDOCAINE HCL (PF) 1 % IJ SOLN
INTRAMUSCULAR | Status: DC | PRN
  Administered 2017-10-17: 2 mL

## 2017-10-17 MED ORDER — VERAPAMIL HCL 2.5 MG/ML IV SOLN
INTRAVENOUS | Status: DC | PRN
  Administered 2017-10-17: 10 mL via INTRA_ARTERIAL

## 2017-10-17 MED ORDER — SODIUM CHLORIDE 0.9% FLUSH
3.0000 mL | Freq: Two times a day (BID) | INTRAVENOUS | Status: DC
  Administered 2017-10-18 – 2017-10-19 (×2): 3 mL via INTRAVENOUS

## 2017-10-17 MED ORDER — HEPARIN SODIUM (PORCINE) 1000 UNIT/ML IJ SOLN
INTRAMUSCULAR | Status: DC | PRN
  Administered 2017-10-17: 3000 [IU] via INTRAVENOUS
  Administered 2017-10-17 (×2): 4500 [IU] via INTRAVENOUS

## 2017-10-17 MED ORDER — NITROGLYCERIN 1 MG/10 ML FOR IR/CATH LAB
INTRA_ARTERIAL | Status: AC
  Filled 2017-10-17: qty 10

## 2017-10-17 MED ORDER — AMLODIPINE BESYLATE 5 MG PO TABS
5.0000 mg | ORAL_TABLET | Freq: Every day | ORAL | Status: DC
  Administered 2017-10-17: 5 mg via ORAL
  Filled 2017-10-17: qty 1

## 2017-10-17 MED ORDER — ASPIRIN 81 MG PO CHEW
81.0000 mg | CHEWABLE_TABLET | Freq: Every day | ORAL | Status: DC
  Administered 2017-10-18 – 2017-10-19 (×2): 81 mg via ORAL
  Filled 2017-10-17 (×2): qty 1

## 2017-10-17 MED ORDER — HEPARIN (PORCINE) IN NACL 1000-0.9 UT/500ML-% IV SOLN
INTRAVENOUS | Status: DC | PRN
  Administered 2017-10-17 (×2): 500 mL

## 2017-10-17 MED ORDER — NITROGLYCERIN 1 MG/10 ML FOR IR/CATH LAB
INTRA_ARTERIAL | Status: DC | PRN
  Administered 2017-10-17 (×2): 200 ug

## 2017-10-17 MED ORDER — NITROGLYCERIN IN D5W 200-5 MCG/ML-% IV SOLN
0.0000 ug/min | INTRAVENOUS | Status: DC
  Administered 2017-10-17: 5 ug/min via INTRAVENOUS
  Filled 2017-10-17: qty 250

## 2017-10-17 SURGICAL SUPPLY — 17 items
BALLN EUPHORA RX 2.25X12 (BALLOONS) ×2
BALLN SAPPHIRE ~~LOC~~ 3.0X12 (BALLOONS) ×2 IMPLANT
BALLOON EUPHORA RX 2.25X12 (BALLOONS) ×1 IMPLANT
CATH 5FR JL3.5 JR4 ANG PIG MP (CATHETERS) ×2 IMPLANT
CATH LAUNCHER 6FR EBU3.5 (CATHETERS) ×2 IMPLANT
DEVICE RAD COMP TR BAND LRG (VASCULAR PRODUCTS) ×2 IMPLANT
GLIDESHEATH SLEND SS 6F .021 (SHEATH) ×2 IMPLANT
GUIDEWIRE INQWIRE 1.5J.035X260 (WIRE) ×1 IMPLANT
INQWIRE 1.5J .035X260CM (WIRE) ×2
KIT ENCORE 26 ADVANTAGE (KITS) ×2 IMPLANT
KIT HEART LEFT (KITS) ×2 IMPLANT
PACK CARDIAC CATHETERIZATION (CUSTOM PROCEDURE TRAY) ×2 IMPLANT
STENT SYNERGY DES 2.75X16 (Permanent Stent) ×2 IMPLANT
STENT SYNERGY DES 3X24 (Permanent Stent) ×2 IMPLANT
TRANSDUCER W/STOPCOCK (MISCELLANEOUS) ×2 IMPLANT
TUBING CIL FLEX 10 FLL-RA (TUBING) ×2 IMPLANT
WIRE ASAHI PROWATER 180CM (WIRE) ×2 IMPLANT

## 2017-10-17 NOTE — Care Management Obs Status (Signed)
[place] NOTIFICATION   Patient Details  Name: Richard [name] MRN: [id number] Date of Birth: Dec 24, 1945   Medicare Observation Status Notification Given:  Yes    Sherald Barge, RN 10/17/2017, 8:47 AM

## 2017-10-17 NOTE — Progress Notes (Signed)
Patient briefly seen and examined. Evaluated by cardiology this am. They plan on transfer to [facility] today for a cardiac cath. Will sign off. Please call us back with questions.  Domingo Mend, MD [facility] Pager: [phone]

## 2017-10-17 NOTE — Care Management (Signed)
CM consult received for cost of Eliquis. Pt has insurance with drug coverage. He has contacted drug company to apply for medication assistance and does not qualify. Pt understands hospital can provide no financial assistance with medication. He has been on warfarin in the past and would rather pay for Eliqus than be on warfarin again.

## 2017-10-17 NOTE — Progress Notes (Signed)
Inpatient Diabetes Program Recommendations  AACE/ADA: New Consensus Statement on Inpatient Glycemic Control (2015)  Target Ranges:  Prepandial:   less than 140 mg/dL      Peak postprandial:   less than 180 mg/dL (1-2 hours)      Critically ill patients:  140 - 180 mg/dL   Lab Results  Component Value Date   GLUCAP 242 (H) 10/17/2017   HGBA1C 11.5 (H) 06/27/2017    Review of Glycemic Control Results for TIMMEY, LAMBA (MRN [id number]) as of 10/17/2017 10:56  Ref. Range 10/17/2017 01:44 10/17/2017 08:02  Glucose-Capillary Latest Ref Range: 70 - 99 mg/dL 289 (H) 242 (H)   Diabetes history: DM2 Outpatient Diabetes medications: Glucotrol 10 mg + Sctos 15 mg + Metformin 1 gm bid Current orders for Inpatient glycemic control: Novolog sensitive tid  Inpatient Diabetes Program Recommendations:   Noted hyperglycemia. Add Lantus 13 units while oral diabetes medications held (0.15 unit/kg  x 86 kg)  Thank you, Bethena Roys E. Maiyah Goyne, RN, MSN, CDE  Diabetes Coordinator Inpatient Glycemic Control Team Team Pager [phone] (8am-5pm) 10/17/2017 11:01 AM

## 2017-10-17 NOTE — Progress Notes (Signed)
Patient complaining of increased chest/jaw pain and shortness of breath. BP 39P systolic from hydralazine and fentanyl received. Held nitro until bolus ordered, titrating to control CP. Patient placed on 2L [place] for comfort. MD aware.

## 2017-10-17 NOTE — Progress Notes (Signed)
ANTICOAGULATION CONSULT NOTE - Initial Consult  Pharmacy Consult for heparin Indication: chest pain/ACS  Allergies  Allergen Reactions  . Plavix [Clopidogrel]     Leg pain  . Statins     Leg pain    Patient Measurements: Height: 6' (182.9 cm) Weight: 189 lb 9.5 oz (86 kg) IBW/kg (Calculated) : 77.6 Heparin Dosing Weight:  86Kg   Vital Signs: Temp: 97.8 F (36.6 C) (08/26 0605) Temp Source: Oral (08/26 0605) BP: 178/96 (08/26 0615) Pulse Rate: 65 (08/26 0605)  Labs: Recent Labs    10/16/17 1623 10/16/17 2326 10/17/17 0548  HGB 12.0*  --  12.5*  HCT 36.6*  --  37.4*  PLT 163  --  137*  CREATININE 1.43*  --  1.04  TROPONINI <0.03 <0.03 <0.03    Estimated Creatinine Clearance: 71.5 mL/min (by C-G formula based on SCr of 1.04 mg/dL).   Medical History: Past Medical History:  Diagnosis Date  . CAD (coronary artery disease)    DES x 2 proximal to mid RCA 2007  . Essential hypertension   . History of atrial flutter    Status post ablation 2008 - Dr. Caryl Comes  . History of transient ischemic attack (TIA)   . Mixed hyperlipidemia    Statin intolerance  . Pneumonia   . Type 2 diabetes mellitus [facility])    Assessment: Pharmacy consulted to dose heparin for this 61 yom with history of CAD and PCI/stent placement (in 2007). He was admitted with chest pain, relieved with NTG, but troponins negative x3 and no acute EKG changes.  Goal of Therapy:  Heparin level 0.3-0.7 units/ml Monitor platelets by anticoagulation protocol: Yes   Plan: monitor platelets closely d/t 26K drop in past day (was on sub-q heparin) Start heparin infusion at 1000 units/hr Check anti-Xa level in 6-8 hours and daily while on heparin Continue to monitor H&H and platelets  Despina Pole, Pharm. D. Clinical Pharmacist 10/17/2017 10:36 AM

## 2017-10-17 NOTE — Interval H&P Note (Signed)
History and Physical Interval Note:  10/17/2017 4:05 PM  Richard [name]  has presented today for surgery, with the diagnosis of cp  The various methods of treatment have been discussed with the patient and family. After consideration of risks, benefits and other options for treatment, the patient has consented to  Procedure(s): LEFT HEART CATH AND CORONARY ANGIOGRAPHY (N/A) as a surgical intervention .  The patient's history has been reviewed, patient examined, no change in status, stable for surgery.  I have reviewed the patient's chart and labs.  Questions were answered to the patient's satisfaction.   Cath Lab Visit (complete for each Cath Lab visit)  Clinical Evaluation Leading to the Procedure:   ACS: Yes.    Non-ACS:    Anginal Classification: CCS III  Anti-ischemic medical therapy: Minimal Therapy (1 class of medications)  Non-Invasive Test Results: No non-invasive testing performed  Prior CABG: No previous CABG        Collier [facility] 10/17/2017 4:05 PM

## 2017-10-17 NOTE — Progress Notes (Signed)
Patient seen post PCI. He continues to have chest pain and jaw pain. Angiograms reviewed with excellent result. Ecg is normal. BP is still high but improved from procedure. Will put on IV Ntg overnight. Give single dose of IV fentanyl and observe.   Kassandra Meriweather [place] MD, [facility]  5:48 PM

## 2017-10-17 NOTE — H&P (View-Only) (Signed)
Cardiology Consultation:   Patient ID: Richard [name]; [id number]; January 06, 1946   Admit date: 10/16/2017 Date of Consult: 10/17/2017  Primary Care Provider: Dione Housekeeper, MD Primary Cardiologist: Previously Dr. Gwenlyn Found 2008 - lives in [place] Primary Electrophysiologist:  Dr. Caryl Comes   Patient Profile:   Richard [name] is a 72 y.o. male with a hx of CAD who is being seen today for the evaluation of chest pain at the request of Dr.Emokpae.  History of Present Illness:   Richard [name] is a 72 yo male patient with history of CAD status post PCI stenting of his proximal mid RCA using Cypher drug-eluting stents March 2007. F/U cath 2008 patent stents and normal LAD and Cfx, LVEF 45-50%. Atrial flutter S/P Ablation by Dr. Caryl Comes 2008. TIA right brain 06/2017, HTN, HLD, DM Hgb A1C 11.5 in May.Hasn't been followed by Cardiology.  Patient was admitted with chest pain relieved with NTG. Troponins negative x 3. EKG without acute change. Patient describes several year history of chest tightness into his jaw assoc with a headache. Usually exertional-digging hole and lasts 10-15 sec and eases spontaneously. Yesterday they had a party and he was busy-3 episodes of chest tightness occurring at rest, more intense than usual, relieved with 1 SL NTG. Doesn't take care of himself, loves to eat, doesn't take a statin, stopped plavix after his stent, stopped eliquis b/c it's putting him in the donut hole-takes ASA BID instead. Quit smoking in May after TIA.   Past Medical History:  Diagnosis Date  . CAD (coronary artery disease)    DES x 2 proximal to mid RCA 2007  . Essential hypertension   . History of atrial flutter    Status post ablation 2008 - Dr. Caryl Comes  . History of transient ischemic attack (TIA)   . Mixed hyperlipidemia    Statin intolerance  . Pneumonia   . Type 2 diabetes mellitus ([place])     Past Surgical History:  Procedure Laterality Date  . BALLOON ANGIOPLASTY, ARTERY    . CARDIAC  ELECTROPHYSIOLOGY MAPPING AND ABLATION    . CARPAL TUNNEL RELEASE Bilateral   . cervical neck fusion     x 4  . CORONARY STENT PLACEMENT     x 2  . ROTATOR CUFF REPAIR Right    x 4  . TIBIA FRACTURE SURGERY Left      Home Medications:  Prior to Admission medications   Medication Sig Start Date End Date Taking? Authorizing Provider  aspirin 325 MG tablet Take by mouth 2 (two) times daily.    Yes [provider]  glipiZIDE (GLUCOTROL) 10 MG tablet Take 10 mg by mouth daily.    Yes [provider]  glucose blood (ONE TOUCH ULTRA TEST) test strip 1 each. 10/02/13  Yes [provider]  metFORMIN (GLUCOPHAGE) 1000 MG tablet Take 1,000 mg by mouth 2 (two) times daily.  06/23/17  Yes [provider]  metoprolol (LOPRESSOR) 100 MG tablet Take 100 mg by mouth daily. 10/07/12  Yes [provider]  nitroGLYCERIN (NITROSTAT) 0.4 MG SL tablet PLACE ONE TABLET (0.4 MG DOSE) UNDER THE TONGUE EVERY 5 (FIVE) MINUTES AS NEEDED FOR CHEST PAIN. 06/27/17  Yes [provider]  pioglitazone (ACTOS) 15 MG tablet Take 15 mg by mouth daily.  07/26/17  Yes [provider]  apixaban (ELIQUIS) 5 MG TABS tablet Take 1 tablet (5 mg total) by mouth 2 (two) times daily. 06/29/17   Geradine Girt, DO  Evolocumab (REPATHA) 140 MG/ML SOSY  Inject 140 mg into the skin every 14 (fourteen) days. Patient not taking: Reported on 10/16/2017 08/16/17   Venancio Poisson, NP    Inpatient Medications: Scheduled Meds: . aspirin  325 mg Oral Daily  . heparin  5,000 Units Subcutaneous Q8H  . insulin aspart  0-9 Units Subcutaneous TID WC  . metoprolol tartrate  100 mg Oral Daily  . sodium chloride flush  3 mL Intravenous Q12H   Continuous Infusions: . sodium chloride    . sodium chloride 50 mL/hr at 10/17/17 0009   PRN Meds: sodium chloride, acetaminophen **OR** acetaminophen, albuterol, hydrALAZINE, nitroGLYCERIN, ondansetron **OR** ondansetron (ZOFRAN) IV, polyethylene  glycol, sodium chloride flush, traZODone  Allergies:    Allergies  Allergen Reactions  . Plavix [Clopidogrel]     Leg pain  . Statins     Leg pain    Social History:   Social History   Socioeconomic History  . Marital status: Married    Spouse name: Not on file  . Number of children: 4  . Years of education: Not on file  . Highest education level: Not on file  Occupational History  . Occupation: truck Animator Needs  . Financial resource strain: Not on file  . Food insecurity:    Worry: Not on file    Inability: Not on file  . Transportation needs:    Medical: Not on file    Non-medical: Not on file  Tobacco Use  . Smoking status: Former Smoker    Packs/day: 1.00    Years: 54.00    Pack years: 54.00    Types: Cigarettes    Last attempt to quit: 07/11/2017    Years since quitting: 0.2  . Smokeless tobacco: Never Used  Substance and Sexual Activity  . Alcohol use: No    Alcohol/week: 0.0 standard drinks    Comment: rare  . Drug use: No  . Sexual activity: Not on file  Lifestyle  . Physical activity:    Days per week: Not on file    Minutes per session: Not on file  . Stress: Not on file  Relationships  . Social connections:    Talks on phone: Not on file    Gets together: Not on file    Attends religious service: Not on file    Active member of club or organization: Not on file    Attends meetings of clubs or organizations: Not on file    Relationship status: Not on file  . Intimate partner violence:    Fear of current or ex partner: Not on file    Emotionally abused: Not on file    Physically abused: Not on file    Forced sexual activity: Not on file  Other Topics Concern  . Not on file  Social History Narrative  . Not on file    Family History:    Family History  Problem Relation Age of Onset  . Diabetes Mother   . Heart disease Mother   . Heart disease Brother   . Emphysema Father   . Lung cancer Brother        x 2  . Colon cancer  Neg Hx   . Esophageal cancer Neg Hx   . Rectal cancer Neg Hx   . Stomach cancer Neg Hx   . Prostate cancer Neg Hx      ROS:  Please see the history of present illness.  Review of Systems  Constitution: Negative.  HENT: Negative.   Cardiovascular: Positive  for chest pain and dyspnea on exertion.  Respiratory: Negative.   Endocrine: Negative.   Hematologic/Lymphatic: Negative.   Musculoskeletal: Positive for joint pain and muscle cramps.  Gastrointestinal: Negative.   Genitourinary: Negative.   Neurological: Negative.     All other ROS reviewed and negative.     Physical Exam/Data:   Vitals:   10/16/17 1900 10/16/17 2054 10/17/17 0605 10/17/17 0615  BP: (!) 159/84 (!) 175/85 (!) 193/101 (!) 178/96  Pulse: 78 69 65   Resp: 17  14   Temp:  97.7 F (36.5 C) 97.8 F (36.6 C)   TempSrc:  Oral Oral   SpO2: 98% 98% 96%   Weight:  86 kg    Height:  6' (1.829 m)      Intake/Output Summary (Last 24 hours) at 10/17/2017 0955 Last data filed at 10/17/2017 0700 Gross per 24 hour  Intake 242.5 ml  Output 200 ml  Net 42.5 ml   Filed Weights   10/16/17 1602 10/16/17 2054  Weight: 86.2 kg 86 kg   Body mass index is 25.71 kg/m.  General:  Well nourished, well developed, in no acute distress  HEENT: normal Lymph: no adenopathy Neck: no JVD Endocrine:  No thryomegaly Vascular: No carotid bruits; FA pulses 2+ bilaterally without bruits  Cardiac:  normal S1, S2; RRR; S4 1/6 sys murmur Lungs:  clear to auscultation bilaterally, no wheezing, rhonchi or rales  Abd: soft, nontender, no hepatomegaly  Ext: no edema Musculoskeletal:  No deformities, BUE and BLE strength normal and equal Skin: warm and dry  Neuro:  CNs 2-12 intact, no focal abnormalities noted Psych:  Normal affect   EKG:  The EKG was personally reviewed and demonstrates:   NSR with old ant MI no acute change Telemetry:  Telemetry was personally reviewed and demonstrates:  NSR  Relevant CV Studies: 2Decho  06/28/17 Study Conclusions   - Left ventricle: The cavity size was normal. Wall thickness was   increased in a pattern of moderate LVH. Systolic function was   normal. The estimated ejection fraction was in the range of 55%   to 60%. Wall motion was normal; there were no regional wall   motion abnormalities. Doppler parameters are consistent with   abnormal left ventricular relaxation (grade 1 diastolic   dysfunction).   Impressions:   - Normal LV systolic function; mild diastolic dysfunction;   moderate LVH. SELECTIVE CORONARY ANGIOGRAPHY: 12/2006  1. Left main normal.  2. LAD normal.  3. Left circumflex normal.  4. Right coronary artery is dominant with two patent stents in the      proximal mid portion and no other significant disease.    LEFT VENTRICULOGRAPHY;  RAO left ventriculogram was performed using 25  mL of Visipaque dye at 12 mL per second.  The overall LVEF was estimated  between 45 and 50% with mild to moderate inferobasal hypokinesia.    IMPRESSION:  Richard [name] has widely patent right coronary artery stents  with noncritical disease and preserved left ventricular function.  His  anatomy is unchanged since his prior cath done 5 months ago.  His  heparin and nitroglycerin were discontinued.  ACT was measured and the  sheath was sewn securely in place.  The patient left the  lab in stable condition. The sheath will be removed once the ACT falls  below 175.  He will remain recumbent for 5 hours at which time he will  be discharged home and will see him back in the office in  1-2 weeks for  follow-up.  He left the lab in stable condition.     Laboratory Data:  Chemistry Recent Labs  Lab 10/16/17 1623 10/17/17 0548  NA 138 139  K 4.5 4.2  CL 103 106  CO2 28 26  GLUCOSE 325* 273*  BUN 23 21  CREATININE 1.43* 1.04  CALCIUM 8.6* 8.7*  GFRNONAA 48* >60  GFRAA 55* >60  ANIONGAP 7 7    No results for input(s): PROT, ALBUMIN, AST, ALT, ALKPHOS, BILITOT in the  last 168 hours. Hematology Recent Labs  Lab 10/16/17 1623 10/17/17 0548  WBC 6.5 5.6  RBC 4.23 4.30  HGB 12.0* 12.5*  HCT 36.6* 37.4*  MCV 86.5 87.0  MCH 28.4 29.1  MCHC 32.8 33.4  RDW 14.2 14.4  PLT 163 137*   Cardiac Enzymes Recent Labs  Lab 10/16/17 1623 10/16/17 2326 10/17/17 0548  TROPONINI <0.03 <0.03 <0.03   No results for input(s): TROPIPOC in the last 168 hours.  BNPNo results for input(s): BNP, PROBNP in the last 168 hours.  DDimer No results for input(s): DDIMER in the last 168 hours.  Radiology/Studies:  Dg Chest 2 View  Result Date: 10/16/2017 CLINICAL DATA:  Chest copper, chest pain. EXAM: CHEST - 2 VIEW COMPARISON:  Chest x-rays dated 06/28/2017 and 10/22/2013. FINDINGS: The heart size and mediastinal contours are within normal limits. Both lungs are clear. The visualized skeletal structures are unremarkable. IMPRESSION: No active cardiopulmonary disease. No evidence of pneumonia or pulmonary edema. Electronically Signed   By: Franki Cabot M.D.   On: 10/16/2017 17:04    Assessment and Plan:   Chest pain MI ruled out with negative troponins and EKG without acute change. Yest chest pain at rest and worrisome for ischemia especially in light of uncontrolled DM and uncontrolled HTN. Recommend transfer to [facility] for cardiac cath. Risk factor modification and compliance issue with patient. Add IV heparin.I have reviewed the risks, indications, and alternatives to angioplasty and stenting with the patient. Risks include but are not limited to bleeding, infection, vascular injury, stroke, myocardial infection, arrhythmia, kidney injury, radiation-related injury in the case of prolonged fluoroscopy use, emergency cardiac surgery, and death. The patient understands the risks of serious complication is low (<0%) and patient agrees to proceed.    CAD S/P Cypher stent RCA x 2 04/2005, f/u cath 12/2006 patent stents and normal LAD and Cfx  Normal LVEF55-60% with grade 1 DD  06/2017  TIA 06/2017 on Eliquis but patient stopped and self medicating with ASA BID  HTN uncontrolled-only on  Metoprolol 100 mg daily-add amlopdipine and would also benefit from ACEI/ARB with DM  DM Hgb A1C 11.5d in May  Atrial flutter ablation 2008 Dr. Caryl Comes  Hyperlipidemia    LDL 191, goal < 70, [id number] recommended but never started  COPD quit smoking in May   For questions or updates, please contact [place] Please consult [email or web address] for contact info under Cardiology/STEMI.   Sumner Boast, PA-C 10/17/2017 9:55 AM   Attending note:  Patient seen and examined.  I reviewed extensive records and updated his chart.  Case discussed with Ms. Bonnell Public PA-C.  Richard [name] has a history of CAD status post DES x2 to the proximal and mid RCA back in 2007, also history of atrial flutter ablation in 2008.  He has hyperlipidemia with statin intolerance, also poorly controlled type 2 diabetes mellitus, prior long-standing tobacco use history but quit smoking in May with diagnosis of TIA.  He was started on Eliquis  after his TIA although has not been compliant with the medication due to cost, also not taking Repatha.  He presents describing a fairly long-standing history of exertional angina, with worsening symptoms and rest pain in the last 24 hours, responsive to nitroglycerin.  He describes his symptoms as being suddenly unable to breathe with a feeling of weight on his chest, also headache and pain in his neck.  On examination this morning he is chest pain-free.  Systolic blood pressures in the 170s, heart rate in the 70s in sinus rhythm by telemetry which I personally reviewed.  Lungs are clear without labored breathing at rest.  Cardiac exam reveals RRR without gallop.  Distal pulses are full.  Lab work shows potassium 4.2, BUN 21, creatinine 1.04, troponin I levels negative x3, hemoglobin 12.5, platelets 137. Chest x-ray reports no acute infiltrates.  I personally reviewed his ECG  which shows sinus rhythm with decreased R wave progression.  Patient presents with unstable angina, escalating symptoms within the last 24 hours, although troponin I negative and ECG without acute ST segment changes.  He has poorly controlled type 2 diabetes mellitus (hemoglobin A1c 11.5), long-standing tobacco abuse although did quit in May, statin intolerance with LDL 191.  He has had some degree of angina symptoms over the last few years, no regular cardiology follow-up.  We discussed cardiac diagnostic testing including noninvasive and invasive techniques.  After reviewing the risks and benefits, he is in agreement to proceed with a diagnostic cardiac catheterization with eye toward revascularization strategies.  In reviewing his chart, he was placed on Eliquis after TIA event in May given overall CHADSVASC score, although has not had any documented atrial arrhythmias other than his atrial flutter ablation back in 2008.  He has not been compliant with this medication recently, and was also to start on Repatha although this has not occurred.  Satira Sark, M.D., F.A.C.C.

## 2017-10-17 NOTE — Consult Note (Addendum)
Cardiology Consultation:   Patient ID: Richard [name]; [id number]; [birth year]   Admit date: 10/16/2017 Date of Consult: 10/17/2017  Primary Care Provider: Dione Housekeeper, MD Primary Cardiologist: Previously Dr. Gwenlyn Found 2008 - lives in [place] Primary Electrophysiologist:  Dr. Caryl Comes   Patient Profile:   Richard [name] is a 72 y.o. male with a hx of CAD who is being seen today for the evaluation of chest pain at the request of Dr.Emokpae.  History of Present Illness:   Richard [name] is a 72 yo male patient with history of CAD status post PCI stenting of his proximal mid RCA using Cypher drug-eluting stents March 2007. F/U cath 2008 patent stents and normal LAD and Cfx, LVEF 45-50%. Atrial flutter S/P Ablation by Dr. Caryl Comes 2008. TIA right brain 06/2017, HTN, HLD, DM Hgb A1C 11.5 in May.Hasn't been followed by Cardiology.  Patient was admitted with chest pain relieved with NTG. Troponins negative x 3. EKG without acute change. Patient describes several year history of chest tightness into his jaw assoc with a headache. Usually exertional-digging hole and lasts 10-15 sec and eases spontaneously. Yesterday they had a party and he was busy-3 episodes of chest tightness occurring at rest, more intense than usual, relieved with 1 SL NTG. Doesn't take care of himself, loves to eat, doesn't take a statin, stopped plavix after his stent, stopped eliquis b/c it's putting him in the donut hole-takes ASA BID instead. Quit smoking in May after TIA.   Past Medical History:  Diagnosis Date  . CAD (coronary artery disease)    DES x 2 proximal to mid RCA 2007  . Essential hypertension   . History of atrial flutter    Status post ablation 2008 - Dr. Caryl Comes  . History of transient ischemic attack (TIA)   . Mixed hyperlipidemia    Statin intolerance  . Pneumonia   . Type 2 diabetes mellitus ([place])     Past Surgical History:  Procedure Laterality Date  . BALLOON ANGIOPLASTY, ARTERY    . CARDIAC  ELECTROPHYSIOLOGY MAPPING AND ABLATION    . CARPAL TUNNEL RELEASE Bilateral   . cervical neck fusion     x 4  . CORONARY STENT PLACEMENT     x 2  . ROTATOR CUFF REPAIR Right    x 4  . TIBIA FRACTURE SURGERY Left      Home Medications:  Prior to Admission medications   Medication Sig Start Date End Date Taking? Authorizing Provider  aspirin 325 MG tablet Take by mouth 2 (two) times daily.    Yes [provider]  glipiZIDE (GLUCOTROL) 10 MG tablet Take 10 mg by mouth daily.    Yes [provider]  glucose blood (ONE TOUCH ULTRA TEST) test strip 1 each. 10/02/13  Yes [provider]  metFORMIN (GLUCOPHAGE) 1000 MG tablet Take 1,000 mg by mouth 2 (two) times daily.  06/23/17  Yes [provider]  metoprolol (LOPRESSOR) 100 MG tablet Take 100 mg by mouth daily. 10/07/12  Yes [provider]  nitroGLYCERIN (NITROSTAT) 0.4 MG SL tablet PLACE ONE TABLET (0.4 MG DOSE) UNDER THE TONGUE EVERY 5 (FIVE) MINUTES AS NEEDED FOR CHEST PAIN. 06/27/17  Yes [provider]  pioglitazone (ACTOS) 15 MG tablet Take 15 mg by mouth daily.  07/26/17  Yes [provider]  apixaban (ELIQUIS) 5 MG TABS tablet Take 1 tablet (5 mg total) by mouth 2 (two) times daily. 06/29/17   Geradine Girt, DO  Evolocumab (REPATHA) 140 MG/ML SOSY  Inject 140 mg into the skin every 14 (fourteen) days. Patient not taking: Reported on 10/16/2017 08/16/17   Venancio Poisson, NP    Inpatient Medications: Scheduled Meds: . aspirin  325 mg Oral Daily  . heparin  5,000 Units Subcutaneous Q8H  . insulin aspart  0-9 Units Subcutaneous TID WC  . metoprolol tartrate  100 mg Oral Daily  . sodium chloride flush  3 mL Intravenous Q12H   Continuous Infusions: . sodium chloride    . sodium chloride 50 mL/hr at 10/17/17 0009   PRN Meds: sodium chloride, acetaminophen **OR** acetaminophen, albuterol, hydrALAZINE, nitroGLYCERIN, ondansetron **OR** ondansetron (ZOFRAN) IV, polyethylene  glycol, sodium chloride flush, traZODone  Allergies:    Allergies  Allergen Reactions  . Plavix [Clopidogrel]     Leg pain  . Statins     Leg pain    Social History:   Social History   Socioeconomic History  . Marital status: Married    Spouse name: Not on file  . Number of children: 4  . Years of education: Not on file  . Highest education level: Not on file  Occupational History  . Occupation: truck Animator Needs  . Financial resource strain: Not on file  . Food insecurity:    Worry: Not on file    Inability: Not on file  . Transportation needs:    Medical: Not on file    Non-medical: Not on file  Tobacco Use  . Smoking status: Former Smoker    Packs/day: 1.00    Years: 54.00    Pack years: 54.00    Types: Cigarettes    Last attempt to quit: 07/11/2017    Years since quitting: 0.2  . Smokeless tobacco: Never Used  Substance and Sexual Activity  . Alcohol use: No    Alcohol/week: 0.0 standard drinks    Comment: rare  . Drug use: No  . Sexual activity: Not on file  Lifestyle  . Physical activity:    Days per week: Not on file    Minutes per session: Not on file  . Stress: Not on file  Relationships  . Social connections:    Talks on phone: Not on file    Gets together: Not on file    Attends religious service: Not on file    Active member of club or organization: Not on file    Attends meetings of clubs or organizations: Not on file    Relationship status: Not on file  . Intimate partner violence:    Fear of current or ex partner: Not on file    Emotionally abused: Not on file    Physically abused: Not on file    Forced sexual activity: Not on file  Other Topics Concern  . Not on file  Social History Narrative  . Not on file    Family History:    Family History  Problem Relation Age of Onset  . Diabetes Mother   . Heart disease Mother   . Heart disease Brother   . Emphysema Father   . Lung cancer Brother        x 2  . Colon cancer  Neg Hx   . Esophageal cancer Neg Hx   . Rectal cancer Neg Hx   . Stomach cancer Neg Hx   . Prostate cancer Neg Hx      ROS:  Please see the history of present illness.  Review of Systems  Constitution: Negative.  HENT: Negative.   Cardiovascular: Positive  for chest pain and dyspnea on exertion.  Respiratory: Negative.   Endocrine: Negative.   Hematologic/Lymphatic: Negative.   Musculoskeletal: Positive for joint pain and muscle cramps.  Gastrointestinal: Negative.   Genitourinary: Negative.   Neurological: Negative.     All other ROS reviewed and negative.     Physical Exam/Data:   Vitals:   10/16/17 1900 10/16/17 2054 10/17/17 0605 10/17/17 0615  BP: (!) 159/84 (!) 175/85 (!) 193/101 (!) 178/96  Pulse: 78 69 65   Resp: 17  14   Temp:  97.7 F (36.5 C) 97.8 F (36.6 C)   TempSrc:  Oral Oral   SpO2: 98% 98% 96%   Weight:  86 kg    Height:  6' (1.829 m)      Intake/Output Summary (Last 24 hours) at 10/17/2017 0955 Last data filed at 10/17/2017 0700 Gross per 24 hour  Intake 242.5 ml  Output 200 ml  Net 42.5 ml   Filed Weights   10/16/17 1602 10/16/17 2054  Weight: 86.2 kg 86 kg   Body mass index is 25.71 kg/m.  General:  Well nourished, well developed, in no acute distress  HEENT: normal Lymph: no adenopathy Neck: no JVD Endocrine:  No thryomegaly Vascular: No carotid bruits; FA pulses 2+ bilaterally without bruits  Cardiac:  normal S1, S2; RRR; S4 1/6 sys murmur Lungs:  clear to auscultation bilaterally, no wheezing, rhonchi or rales  Abd: soft, nontender, no hepatomegaly  Ext: no edema Musculoskeletal:  No deformities, BUE and BLE strength normal and equal Skin: warm and dry  Neuro:  CNs 2-12 intact, no focal abnormalities noted Psych:  Normal affect   EKG:  The EKG was personally reviewed and demonstrates:   NSR with old ant MI no acute change Telemetry:  Telemetry was personally reviewed and demonstrates:  NSR  Relevant CV Studies: 2Decho  06/28/17 Study Conclusions   - Left ventricle: The cavity size was normal. Wall thickness was   increased in a pattern of moderate LVH. Systolic function was   normal. The estimated ejection fraction was in the range of 55%   to 60%. Wall motion was normal; there were no regional wall   motion abnormalities. Doppler parameters are consistent with   abnormal left ventricular relaxation (grade 1 diastolic   dysfunction).   Impressions:   - Normal LV systolic function; mild diastolic dysfunction;   moderate LVH. SELECTIVE CORONARY ANGIOGRAPHY: 12/2006  1. Left main normal.  2. LAD normal.  3. Left circumflex normal.  4. Right coronary artery is dominant with two patent stents in the      proximal mid portion and no other significant disease.    LEFT VENTRICULOGRAPHY;  RAO left ventriculogram was performed using 25  mL of Visipaque dye at 12 mL per second.  The overall LVEF was estimated  between 45 and 50% with mild to moderate inferobasal hypokinesia.    IMPRESSION:  Richard [name] has widely patent right coronary artery stents  with noncritical disease and preserved left ventricular function.  His  anatomy is unchanged since his prior cath done 5 months ago.  His  heparin and nitroglycerin were discontinued.  ACT was measured and the  sheath was sewn securely in place.  The patient left the  lab in stable condition. The sheath will be removed once the ACT falls  below 175.  He will remain recumbent for 5 hours at which time he will  be discharged home and will see him back in the office in  1-2 weeks for  follow-up.  He left the lab in stable condition.     Laboratory Data:  Chemistry Recent Labs  Lab 10/16/17 1623 10/17/17 0548  NA 138 139  K 4.5 4.2  CL 103 106  CO2 28 26  GLUCOSE 325* 273*  BUN 23 21  CREATININE 1.43* 1.04  CALCIUM 8.6* 8.7*  GFRNONAA 48* >60  GFRAA 55* >60  ANIONGAP 7 7    No results for input(s): PROT, ALBUMIN, AST, ALT, ALKPHOS, BILITOT in the  last 168 hours. Hematology Recent Labs  Lab 10/16/17 1623 10/17/17 0548  WBC 6.5 5.6  RBC 4.23 4.30  HGB 12.0* 12.5*  HCT 36.6* 37.4*  MCV 86.5 87.0  MCH 28.4 29.1  MCHC 32.8 33.4  RDW 14.2 14.4  PLT 163 137*   Cardiac Enzymes Recent Labs  Lab 10/16/17 1623 10/16/17 2326 10/17/17 0548  TROPONINI <0.03 <0.03 <0.03   No results for input(s): TROPIPOC in the last 168 hours.  BNPNo results for input(s): BNP, PROBNP in the last 168 hours.  DDimer No results for input(s): DDIMER in the last 168 hours.  Radiology/Studies:  Dg Chest 2 View  Result Date: 10/16/2017 CLINICAL DATA:  Chest copper, chest pain. EXAM: CHEST - 2 VIEW COMPARISON:  Chest x-rays dated 06/28/2017 and 10/22/2013. FINDINGS: The heart size and mediastinal contours are within normal limits. Both lungs are clear. The visualized skeletal structures are unremarkable. IMPRESSION: No active cardiopulmonary disease. No evidence of pneumonia or pulmonary edema. Electronically Signed   By: Franki Cabot M.D.   On: 10/16/2017 17:04    Assessment and Plan:   Chest pain MI ruled out with negative troponins and EKG without acute change. Yest chest pain at rest and worrisome for ischemia especially in light of uncontrolled DM and uncontrolled HTN. Recommend transfer to [facility] for cardiac cath. Risk factor modification and compliance issue with patient. Add IV heparin.I have reviewed the risks, indications, and alternatives to angioplasty and stenting with the patient. Risks include but are not limited to bleeding, infection, vascular injury, stroke, myocardial infection, arrhythmia, kidney injury, radiation-related injury in the case of prolonged fluoroscopy use, emergency cardiac surgery, and death. The patient understands the risks of serious complication is low (<6%) and patient agrees to proceed.    CAD S/P Cypher stent RCA x 2 04/2005, f/u cath 12/2006 patent stents and normal LAD and Cfx  Normal LVEF55-60% with grade 1 DD  06/2017  TIA 06/2017 on Eliquis but patient stopped and self medicating with ASA BID  HTN uncontrolled-only on  Metoprolol 100 mg daily-add amlopdipine and would also benefit from ACEI/ARB with DM  DM Hgb A1C 11.5d in May  Atrial flutter ablation 2008 Dr. Caryl Comes  Hyperlipidemia    LDL 191, goal < 70, [id number] recommended but never started  COPD quit smoking in May   For questions or updates, please contact [place] Please consult [email or web address] for contact info under Cardiology/STEMI.   Sumner Boast, PA-C 10/17/2017 9:55 AM   Attending note:  Patient seen and examined.  I reviewed extensive records and updated his chart.  Case discussed with Ms. Bonnell Public PA-C.  Richard [name] has a history of CAD status post DES x2 to the proximal and mid RCA back in 2007, also history of atrial flutter ablation in 2008.  He has hyperlipidemia with statin intolerance, also poorly controlled type 2 diabetes mellitus, prior long-standing tobacco use history but quit smoking in May with diagnosis of TIA.  He was started on Eliquis  after his TIA although has not been compliant with the medication due to cost, also not taking Repatha.  He presents describing a fairly long-standing history of exertional angina, with worsening symptoms and rest pain in the last 24 hours, responsive to nitroglycerin.  He describes his symptoms as being suddenly unable to breathe with a feeling of weight on his chest, also headache and pain in his neck.  On examination this morning he is chest pain-free.  Systolic blood pressures in the 170s, heart rate in the 70s in sinus rhythm by telemetry which I personally reviewed.  Lungs are clear without labored breathing at rest.  Cardiac exam reveals RRR without gallop.  Distal pulses are full.  Lab work shows potassium 4.2, BUN 21, creatinine 1.04, troponin I levels negative x3, hemoglobin 12.5, platelets 137. Chest x-ray reports no acute infiltrates.  I personally reviewed his ECG  which shows sinus rhythm with decreased R wave progression.  Patient presents with unstable angina, escalating symptoms within the last 24 hours, although troponin I negative and ECG without acute ST segment changes.  He has poorly controlled type 2 diabetes mellitus (hemoglobin A1c 11.5), long-standing tobacco abuse although did quit in May, statin intolerance with LDL 191.  He has had some degree of angina symptoms over the last few years, no regular cardiology follow-up.  We discussed cardiac diagnostic testing including noninvasive and invasive techniques.  After reviewing the risks and benefits, he is in agreement to proceed with a diagnostic cardiac catheterization with eye toward revascularization strategies.  In reviewing his chart, he was placed on Eliquis after TIA event in May given overall CHADSVASC score, although has not had any documented atrial arrhythmias other than his atrial flutter ablation back in 2008.  He has not been compliant with this medication recently, and was also to start on Repatha although this has not occurred.  Satira Sark, M.D., F.A.C.C.

## 2017-10-17 NOTE — Progress Notes (Signed)
I notified Dr.Skains about continued CP mid sternal ache 6/10 with jaw pain and shortness of breath. BP elevated 162/90 and NSR 89.The plan is to give labetalol more frequent and give morphine. Right radial site with TR band unable to start deflate at this time due to bleeding this will be delayed until site stable.  I will continue to monitor.

## 2017-10-18 ENCOUNTER — Encounter (HOSPITAL_COMMUNITY): Payer: Self-pay | Admitting: Cardiology

## 2017-10-18 DIAGNOSIS — J449 Chronic obstructive pulmonary disease, unspecified: Secondary | ICD-10-CM | POA: Diagnosis present

## 2017-10-18 DIAGNOSIS — E785 Hyperlipidemia, unspecified: Secondary | ICD-10-CM | POA: Diagnosis not present

## 2017-10-18 DIAGNOSIS — E1165 Type 2 diabetes mellitus with hyperglycemia: Secondary | ICD-10-CM | POA: Diagnosis present

## 2017-10-18 DIAGNOSIS — R079 Chest pain, unspecified: Secondary | ICD-10-CM | POA: Diagnosis not present

## 2017-10-18 DIAGNOSIS — I10 Essential (primary) hypertension: Secondary | ICD-10-CM | POA: Diagnosis present

## 2017-10-18 DIAGNOSIS — I200 Unstable angina: Secondary | ICD-10-CM | POA: Diagnosis not present

## 2017-10-18 DIAGNOSIS — Z87891 Personal history of nicotine dependence: Secondary | ICD-10-CM | POA: Diagnosis not present

## 2017-10-18 DIAGNOSIS — Z7901 Long term (current) use of anticoagulants: Secondary | ICD-10-CM | POA: Diagnosis not present

## 2017-10-18 DIAGNOSIS — Z981 Arthrodesis status: Secondary | ICD-10-CM | POA: Diagnosis not present

## 2017-10-18 DIAGNOSIS — Z8249 Family history of ischemic heart disease and other diseases of the circulatory system: Secondary | ICD-10-CM | POA: Diagnosis not present

## 2017-10-18 DIAGNOSIS — Z9114 Patient's other noncompliance with medication regimen: Secondary | ICD-10-CM | POA: Diagnosis not present

## 2017-10-18 DIAGNOSIS — Z955 Presence of coronary angioplasty implant and graft: Secondary | ICD-10-CM | POA: Diagnosis not present

## 2017-10-18 DIAGNOSIS — R072 Precordial pain: Secondary | ICD-10-CM | POA: Diagnosis present

## 2017-10-18 DIAGNOSIS — N179 Acute kidney failure, unspecified: Secondary | ICD-10-CM | POA: Diagnosis present

## 2017-10-18 DIAGNOSIS — E118 Type 2 diabetes mellitus with unspecified complications: Secondary | ICD-10-CM | POA: Diagnosis not present

## 2017-10-18 DIAGNOSIS — E1169 Type 2 diabetes mellitus with other specified complication: Secondary | ICD-10-CM | POA: Diagnosis not present

## 2017-10-18 DIAGNOSIS — Z7982 Long term (current) use of aspirin: Secondary | ICD-10-CM | POA: Diagnosis not present

## 2017-10-18 DIAGNOSIS — Z8673 Personal history of transient ischemic attack (TIA), and cerebral infarction without residual deficits: Secondary | ICD-10-CM | POA: Diagnosis not present

## 2017-10-18 DIAGNOSIS — Z79899 Other long term (current) drug therapy: Secondary | ICD-10-CM | POA: Diagnosis not present

## 2017-10-18 DIAGNOSIS — I4891 Unspecified atrial fibrillation: Secondary | ICD-10-CM | POA: Diagnosis present

## 2017-10-18 DIAGNOSIS — R0981 Nasal congestion: Secondary | ICD-10-CM

## 2017-10-18 DIAGNOSIS — E782 Mixed hyperlipidemia: Secondary | ICD-10-CM | POA: Diagnosis present

## 2017-10-18 DIAGNOSIS — Z833 Family history of diabetes mellitus: Secondary | ICD-10-CM | POA: Diagnosis not present

## 2017-10-18 DIAGNOSIS — I25110 Atherosclerotic heart disease of native coronary artery with unstable angina pectoris: Secondary | ICD-10-CM | POA: Diagnosis present

## 2017-10-18 DIAGNOSIS — I252 Old myocardial infarction: Secondary | ICD-10-CM | POA: Diagnosis not present

## 2017-10-18 DIAGNOSIS — Z794 Long term (current) use of insulin: Secondary | ICD-10-CM | POA: Diagnosis not present

## 2017-10-18 LAB — GLUCOSE, CAPILLARY
GLUCOSE-CAPILLARY: 228 mg/dL — AB (ref 70–99)
GLUCOSE-CAPILLARY: 306 mg/dL — AB (ref 70–99)
Glucose-Capillary: 201 mg/dL — ABNORMAL HIGH (ref 70–99)
Glucose-Capillary: 251 mg/dL — ABNORMAL HIGH (ref 70–99)

## 2017-10-18 LAB — BASIC METABOLIC PANEL
Anion gap: 8 (ref 5–15)
BUN: 16 mg/dL (ref 8–23)
CO2: 23 mmol/L (ref 22–32)
Calcium: 8.6 mg/dL — ABNORMAL LOW (ref 8.9–10.3)
Chloride: 107 mmol/L (ref 98–111)
Creatinine, Ser: 1.2 mg/dL (ref 0.61–1.24)
GFR, EST NON AFRICAN AMERICAN: 59 mL/min — AB (ref >60)
Glucose, Bld: 304 mg/dL — ABNORMAL HIGH (ref 70–99)
POTASSIUM: 4.3 mmol/L (ref 3.5–5.1)
Sodium: 138 mmol/L (ref 135–145)

## 2017-10-18 LAB — CBC
HCT: 37 % — ABNORMAL LOW (ref 39.0–52.0)
Hemoglobin: 12 g/dL — ABNORMAL LOW (ref 13.0–17.0)
MCH: 28.6 pg (ref 26.0–34.0)
MCHC: 32.4 g/dL (ref 30.0–36.0)
MCV: 88.1 fL (ref 78.0–100.0)
PLATELETS: 152 10*3/uL (ref 150–400)
RBC: 4.2 MIL/uL — AB (ref 4.22–5.81)
RDW: 14.5 % (ref 11.5–15.5)
WBC: 9.3 10*3/uL (ref 4.0–10.5)

## 2017-10-18 LAB — HEMOGLOBIN A1C
HEMOGLOBIN A1C: 9.6 % — AB (ref 4.8–5.6)
Mean Plasma Glucose: 228.82 mg/dL

## 2017-10-18 LAB — TROPONIN I: TROPONIN I: 1.48 ng/mL — AB (ref <0.03)

## 2017-10-18 MED ORDER — IRBESARTAN 150 MG PO TABS
75.0000 mg | ORAL_TABLET | Freq: Every day | ORAL | Status: DC
  Administered 2017-10-18 – 2017-10-19 (×2): 75 mg via ORAL
  Filled 2017-10-18 (×2): qty 1

## 2017-10-18 MED ORDER — METOPROLOL TARTRATE 50 MG PO TABS
100.0000 mg | ORAL_TABLET | Freq: Two times a day (BID) | ORAL | Status: DC
  Administered 2017-10-18 – 2017-10-19 (×2): 100 mg via ORAL
  Filled 2017-10-18 (×2): qty 2

## 2017-10-18 MED ORDER — ISOSORBIDE MONONITRATE ER 30 MG PO TB24
30.0000 mg | ORAL_TABLET | Freq: Every day | ORAL | Status: DC
  Administered 2017-10-18 – 2017-10-19 (×2): 30 mg via ORAL
  Filled 2017-10-18 (×2): qty 1

## 2017-10-18 MED ORDER — LORATADINE 10 MG PO TABS
10.0000 mg | ORAL_TABLET | Freq: Every day | ORAL | Status: DC
  Administered 2017-10-18 – 2017-10-19 (×2): 10 mg via ORAL
  Filled 2017-10-18 (×2): qty 1

## 2017-10-18 MED FILL — Hydralazine HCl Inj 20 MG/ML: INTRAMUSCULAR | Qty: 1 | Status: AC

## 2017-10-18 NOTE — Progress Notes (Addendum)
Progress Note  Patient Name: Richard [name] Date of Encounter: 10/18/2017  Primary Cardiologist:[place]  Subjective   He has multiple complaints including persistent precordial and mid back discomfort associated with pain in the left jaw.  Discomfort has been unrelenting since the procedure.  EKG this morning does not reveal any acute ischemic change.  Episodes of shortness of breath occur after he has Brilinta.  Previously on Plavix but states he cannot tolerate it due to muscle aching.  Hospital stay for TIA.  Was placed on Eliquis but stopped taking it because of cost.  Self-medicating with aspirin twice daily since he stopped Brilinta.  History of atrial flutter status post ablation.  Terrible risk factor control with LDL 191 hemoglobin A1c 11.5.  Inpatient Medications    Scheduled Meds: . aspirin  81 mg Oral Daily  . insulin aspart  0-9 Units Subcutaneous TID AC & HS  . metoprolol tartrate  100 mg Oral Daily  . sodium chloride flush  3 mL Intravenous Q12H  . sodium chloride flush  3 mL Intravenous Q12H  . sodium chloride flush  3 mL Intravenous Q12H  . ticagrelor  90 mg Oral Q12H   Continuous Infusions: . sodium chloride    . sodium chloride    . nitroGLYCERIN 20 mcg/min (10/18/17 0516)   PRN Meds: sodium chloride, sodium chloride, acetaminophen **OR** acetaminophen, albuterol, hydrALAZINE, morphine injection, nitroGLYCERIN, ondansetron **OR** ondansetron (ZOFRAN) IV, polyethylene glycol, sodium chloride flush, sodium chloride flush, sodium chloride flush, traZODone   Vital Signs    Vitals:   10/18/17 0300 10/18/17 0332 10/18/17 0722 10/18/17 0742  BP: (!) 141/71 (!) 141/71 (!) 150/79   Pulse: 89 76 71   Resp: 11 10 11    Temp: 98.6 F (37 C) 98.6 F (37 C) 97.7 F (36.5 C)   TempSrc:  Oral Oral   SpO2: 100% 100% 100% 100%  Weight:      Height:        Intake/Output Summary (Last 24 hours) at 10/18/2017 0759 Last data filed at 10/18/2017 0743 Gross  per 24 hour  Intake 1532 ml  Output 650 ml  Net 882 ml   Filed Weights   10/16/17 2054 10/17/17 1243 10/17/17 1735  Weight: 86 kg 86.2 kg 87.5 kg    Telemetry    Sinus rhythm and sinus bradycardia- Personally Reviewed  ECG    Normal sinus rhythm with occasional PAC- Personally Reviewed  Physical Exam  Complaining of pain GEN: No acute distress.   Neck: No JVD Cardiac: RRR, no murmurs, rubs, or gallops.  Respiratory: Clear to auscultation bilaterally. GI: Soft, nontender, non-distended  MS: No edema; No deformity. Neuro:  Nonfocal  Psych: Normal affect   Labs    Chemistry Recent Labs  Lab 10/16/17 1623 10/17/17 0548 10/18/17 0211  NA 138 139 138  K 4.5 4.2 4.3  CL 103 106 107  CO2 28 26 23   GLUCOSE 325* 273* 304*  BUN 23 21 16   CREATININE 1.43* 1.04 1.20  CALCIUM 8.6* 8.7* 8.6*  GFRNONAA 48* >60 59*  GFRAA 55* >60 >60  ANIONGAP 7 7 8      Hematology Recent Labs  Lab 10/16/17 1623 10/17/17 0548 10/18/17 0211  WBC 6.5 5.6 9.3  RBC 4.23 4.30 4.20*  HGB 12.0* 12.5* 12.0*  HCT 36.6* 37.4* 37.0*  MCV 86.5 87.0 88.1  MCH 28.4 29.1 28.6  MCHC 32.8 33.4 32.4  RDW 14.2 14.4 14.5  PLT 163 137* 152    Cardiac Enzymes Recent  Labs  Lab 10/16/17 1623 10/16/17 2326 10/17/17 0548 10/17/17 1040  TROPONINI <0.03 <0.03 <0.03 <0.03   No results for input(s): TROPIPOC in the last 168 hours.   BNPNo results for input(s): BNP, PROBNP in the last 168 hours.   DDimer No results for input(s): DDIMER in the last 168 hours.   Radiology    Dg Chest 2 View  Result Date: 10/16/2017 CLINICAL DATA:  Chest copper, chest pain. EXAM: CHEST - 2 VIEW COMPARISON:  Chest x-rays dated 06/28/2017 and 10/22/2013. FINDINGS: The heart size and mediastinal contours are within normal limits. Both lungs are clear. The visualized skeletal structures are unremarkable. IMPRESSION: No active cardiopulmonary disease. No evidence of pneumonia or pulmonary edema. Electronically Signed    By: Franki Cabot M.D.   On: 10/16/2017 17:04    Cardiac Studies   Cardiac catheterization 10/17/2017: Diagnostic Diagram       Post-Intervention Diagram        Diffuse disease noted proximal to obtuse marginal stent and residual ostial disease and obtuse marginal #1.  Patient Profile     72 y.o. male with a hx of CAD who is being seen today for the evaluation of chest pain at the request of Dr.Emokpae.  Cath demonstrated diffuse moderate to severe disease in all 3 vessels with high-grade obstruction in the mid LAD and second obtuse marginal which were both treated with DES by Dr. [place] 10/18/2017.  Patient had persistent chest pain post procedure last evening.  Assessment & Plan    1.  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  Status post LAD and obtuse marginal stent.  Significant residual disease and circumflex territory.  2.  Persistent chest and back discomfort since PCI.  No current EKG changes despite discomfort.  IV nitroglycerin did not change symptoms.  Plan check troponin, wean nitro, and ambulate. 3.  Diabetes mellitus type 2 with poor control. 4.  Severe hyperlipidemia with LDL greater than 190. 5.  Dyspnea correlating with Brilinta dosing. 6.  Nasal congestion will be treated with Zyrtec.  Plan to DC IV nitro, check cardiac markers, ambulate, and plan discharge later today or in a.m. continue Brilinta for now.  For questions or updates, please contact [place] Please consult [email or web address] for contact info under Cardiology/STEMI.      Signed, Sinclair Grooms, MD  10/18/2017, 7:59 AM

## 2017-10-18 NOTE — Progress Notes (Signed)
CARDIAC REHAB PHASE I   PRE:  Rate/Rhythm: 74 SR  BP:  Supine:   Sitting: 101/54  Standing:    SaO2: 98%RA  MODE:  Ambulation: 420 ft   POST:  Rate/Rhythm: 79 SR  BP:  Supine:   Sitting: 111/53  Standing:    SaO2: 98%RA 1320-1420. Pt walked 420 ft on RA with steady gait. No CP. Legs started to hurt a little with this distance. Discussed importance of brilinta with stent. Reviewed NTG use, carb counting and heart healthy diets, ex ed , CRP 2. Referred to [facility] CRP 2. Notified case manager needs to see re brilinta. Pt has had difficulty affording meds. Will be on eliquis and brilinta now.    Graylon Good, RN BSN  10/18/2017 2:29 PM

## 2017-10-18 NOTE — Progress Notes (Signed)
Inpatient Diabetes Program Recommendations  AACE/ADA: New Consensus Statement on Inpatient Glycemic Control (2015)  Target Ranges:  Prepandial:   less than 140 mg/dL      Peak postprandial:   less than 180 mg/dL (1-2 hours)      Critically ill patients:  140 - 180 mg/dL   Lab Results  Component Value Date   GLUCAP 201 (H) 10/18/2017   HGBA1C 9.6 (H) 10/18/2017   Results for USMAN, MILLETT (MRN [id number]) as of 10/18/2017 14:13  Ref. Range 06/27/2017 19:54 10/18/2017 02:11  Hemoglobin A1C Latest Ref Range: 4.8 - 5.6 % 11.5 (H) 9.6 (H)   Review of Glycemic Control Results for TOMMASO, CAVITT (MRN [id number]) as of 10/18/2017 14:13  Ref. Range 10/17/2017 12:53 10/17/2017 18:20 10/17/2017 23:15 10/18/2017 08:06 10/18/2017 11:27  Glucose-Capillary Latest Ref Range: 70 - 99 mg/dL 161 (H) 116 (H) 309 (H) 228 (H) 201 (H)   Diabetes history: Type 2 DM Outpatient Diabetes medications: Glucotrol 10 mg daily, Metformin 1000 mg bid, Actos 15 mg daily Current orders for Inpatient glycemic control:  Novolog sensitive tid with meals and HS  Inpatient Diabetes Program Recommendations:    Spoke with patient and wife regarding DM.  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  We discussed A1C in May of 2019. Attempted to explain that the beta cells decline in function over time with type 2 DM.   Wife feels that patient has done better since May to improve blood sugars stating that they range from 90-220 mg/dL.  A1C was pending at the time however is now back=9.6%. This is improved from May.  Will follow back up with patient and wife tomorrow, however based on our conversation today, I do not think they will be agreeable to insulin.  Will follow up on 8/28.    Thanks,  Adah Perl, RN, BC-ADM Inpatient Diabetes Coordinator Pager [phone]

## 2017-10-18 NOTE — Progress Notes (Signed)
Progress Note  Patient Name: Richard [name] Date of Encounter: 10/18/2017  Primary Cardiologist: Domenic Polite / Gwenlyn Found  -- - lives in [place]  Subjective   Had lots of CP post Cath, then some CP o/n ~ 4 PM.  Finally getting better Has not walked. No dyspnea  Inpatient Medications    Scheduled Meds: . aspirin  81 mg Oral Daily  . insulin aspart  0-9 Units Subcutaneous TID AC & HS  . irbesartan  75 mg Oral Daily  . loratadine  10 mg Oral Daily  . metoprolol tartrate  100 mg Oral BID  . sodium chloride flush  3 mL Intravenous Q12H  . sodium chloride flush  3 mL Intravenous Q12H  . sodium chloride flush  3 mL Intravenous Q12H  . ticagrelor  90 mg Oral Q12H   Continuous Infusions: . sodium chloride    . sodium chloride     PRN Meds: sodium chloride, sodium chloride, acetaminophen **OR** acetaminophen, albuterol, hydrALAZINE, morphine injection, nitroGLYCERIN, ondansetron **OR** ondansetron (ZOFRAN) IV, polyethylene glycol, sodium chloride flush, sodium chloride flush, sodium chloride flush, traZODone   Vital Signs    Vitals:   10/18/17 0300 10/18/17 0332 10/18/17 0722 10/18/17 0742  BP: (!) 141/71 (!) 141/71 (!) 150/79   Pulse: 89 76 71   Resp: 11 10 11    Temp: 98.6 F (37 C) 98.6 F (37 C) 97.7 F (36.5 C)   TempSrc:  Oral Oral   SpO2: 100% 100% 100% 100%  Weight:      Height:        Intake/Output Summary (Last 24 hours) at 10/18/2017 0914 Last data filed at 10/18/2017 0840 Gross per 24 hour  Intake 1382 ml  Output 1350 ml  Net 32 ml   Filed Weights   10/16/17 2054 10/17/17 1243 10/17/17 1735  Weight: 86 kg 86.2 kg 87.5 kg    Telemetry    NSR with PVCs - Personally Reviewed  ECG    NSR, PACs. - rate 77.  Otherwise normal axis, intervals and durations- Personally Reviewed  Physical Exam   Physical Exam  Constitutional: He is oriented to person, place, and time. He appears well-developed and well-nourished. No distress.  Chronically ill but otherwise  nontoxic appearing  HENT:  Head: Normocephalic and atraumatic.  Neck: Normal range of motion. No hepatojugular reflux and no JVD present. Carotid bruit is present (Left).  Cardiovascular: Normal rate, regular rhythm, S1 normal and S2 normal.  Occasional extrasystoles are present. PMI is not displaced. Exam reveals gallop, S4 and decreased pulses (Bilateral pedal pulses mildly decreased.).  Murmur heard.  Low-pitched harsh crescendo-decrescendo early systolic murmur is present with a grade of 1/6 at the upper right sternal border radiating to the neck. Pulmonary/Chest: Effort normal and breath sounds normal. No respiratory distress. He has no wheezes.  Mild diffuse interstitial sounds but no rales or rhonchi.  Abdominal: Soft. Bowel sounds are normal.  No HSM  Musculoskeletal: Normal range of motion. He exhibits no edema.  Cath site C/D/I  Neurological: He is alert and oriented to person, place, and time.  Psychiatric: He has a normal mood and affect. His behavior is normal. Judgment and thought content normal.  Vitals reviewed.   Labs    Chemistry Recent Labs  Lab 10/16/17 1623 10/17/17 0548 10/18/17 0211  NA 138 139 138  K 4.5 4.2 4.3  CL 103 106 107  CO2 28 26 23   GLUCOSE 325* 273* 304*  BUN 23 21 16   CREATININE 1.43* 1.04  1.20  CALCIUM 8.6* 8.7* 8.6*  GFRNONAA 48* >60 59*  GFRAA 55* >60 >60  ANIONGAP 7 7 8      Hematology Recent Labs  Lab 10/16/17 1623 10/17/17 0548 10/18/17 0211  WBC 6.5 5.6 9.3  RBC 4.23 4.30 4.20*  HGB 12.0* 12.5* 12.0*  HCT 36.6* 37.4* 37.0*  MCV 86.5 87.0 88.1  MCH 28.4 29.1 28.6  MCHC 32.8 33.4 32.4  RDW 14.2 14.4 14.5  PLT 163 137* 152    Cardiac Enzymes Recent Labs  Lab 10/16/17 1623 10/16/17 2326 10/17/17 0548 10/17/17 1040  TROPONINI <0.03 <0.03 <0.03 <0.03   No results for input(s): TROPIPOC in the last 168 hours.   BNPNo results for input(s): BNP, PROBNP in the last 168 hours.   DDimer No results for input(s): DDIMER  in the last 168 hours.   Radiology    Dg Chest 2 View  Result Date: 10/16/2017 CLINICAL DATA:  Chest copper, chest pain. EXAM: CHEST - 2 VIEW COMPARISON:  Chest x-rays dated 06/28/2017 and 10/22/2013. FINDINGS: The heart size and mediastinal contours are within normal limits. Both lungs are clear. The visualized skeletal structures are unremarkable. IMPRESSION: No active cardiopulmonary disease. No evidence of pneumonia or pulmonary edema. Electronically Signed   By: Franki Cabot M.D.   On: 10/16/2017 17:04    Cardiac Studies   2D Echo May 2019: Moderate LVH.  EF 55-60%.  GR 1 DD.  CARDIAC CATH-PCI 10/17/2017: 1. Severe 2 vessel obstructive CAD- patient has diffuse CAD: - 85% mid LAD; - 85% large OM2  2. Patent stents in RCA.  3. Normal LVEDP  4. Successful PCI of the mid LAD with DES - STENT SYNERGY DES 2.75X16 5. Successful PCI of the OM2 - STENT SYNERGY DES 3X24. Plan: aggressive risk factor modification. Need to stress compliance with medical therapy. DAPT for one year. Since he has no documented atrial arrhythmia since his ablation I would not resume DOAC. Anticipate DC in am  Recommend uninterrupted dual antiplatelet therapy with Aspirin 81mg  daily and Ticagrelor 90mg  twice daily for a minimum of 12 months (ACS - Class I recommendation). Post-Intervention Diagram    Patient Profile   72 y.o. male with a long-standing history of CAD status post 2 stents in the proximal and mid RCA back in 2007 and patent stents in 2008.  EF was about 45 to 50% at that time.  He is status post A. fib ablation in 2008. Was doing relatively well but had a TIA in May 2019. His cardiac risk factors include long-standing smoking but quit in May 2019, poorly controlled hypertension with A1c over 11 along with hypertension hyperlipidemia.  Hyperlipidemia has been difficult because of statin intolerance -he never started Repatha because of cost. He does not take care of himself.  He does not take statin  because of intolerance, stop Plavix after his stents.  He stopped Eliquis because of cost.  He continued to smoke until his TIA in May 2019 and has poorly controlled diabetes.  He was admitted to [facility] on 8/25 & transferred to [facility] and seen by [name] for 3 episodes of resting chest tightness that were quite intense.  Relieved with nitroglycerin.  This is concerning for unstable angina and he was planned for cardiac catheterization with PCI.  Assessment & Plan    Principal Problem:   Unstable angina (HCC) Active Problems:   Essential hypertension   Type 2 diabetes mellitus with complication, with long-term current use of insulin ([place])  Hyperlipidemia associated with type 2 diabetes mellitus (HCC) - Statin intolerant   Coronary artery disease involving native coronary artery of native heart with unstable angina pectoris (HCC)   Atrial flutter ([place]) - s/p ablation  Principal Problem:   Unstable angina ([place]) /Coronary artery disease involving native coronary artery of native heart with unstable angina pectoris ([place])  Cath with LAD and circumflex makes lesions treated with DES stents yesterday. -->  Post-cath chest pain probably related to hypertension and spasm, will start a nitroglycerin drip.  We will convert from nitroglycerin drip to Imdur  Ensure that metoprolol is written twice daily --if BP tolerates after adding ARB, restart amlodipine  On aspirin plus Brilinta now.  Case management consult to assist with Brilinta.  Will need to continue to stress the importance of staying on DAPT    Essential hypertension  Has been receiving PRN medications all night long and is on nitroglycerin drip, will add ARB Avapro 75 mg daily.  Change metoprolol to 100 mg twice daily -> if BP tolerates, would then use amlodipine.  DC nitroglycerin drip and start Imdur   Type 2 diabetes mellitus with complication, with long-term current use of insulin (HCC)  Currently on sliding scale  insulin.  Will probably need to be on insulin at home, will get diabetes counseling here.  Metformin on hold, can restart 48 hours post cath  He is on Actos and glipizide at home, would prefer to consider another option however financial issues may be of the limiting factor.   Hyperlipidemia associated with type 2 diabetes mellitus (HCC) - Statin intolerant  Adamantly refused to take statins because of prior history.  Was supposed to be on Repatha.  Will need to reestablish with [facility] pharmacist team to assist.      Atrial flutter [facility]) - s/p ablation  Had a history of TIA back in May, difficult to blame this on atrial flutter since he had ablation.  Was on Eliquis post TIA but at this point with him needing to be on DAPT, and financial issues being a concern, would probably not restart Eliquis.  Continue beta-blocker.  Monitor on telemetry for arrhythmia.  Plan will be to titrate medications wean off nitroglycerin and transferred to the oncology today.  If stable discharge tomorrow. He will need close follow-up with both cardiology (and [facility]) along with his PCP to better control diabetes.   For questions or updates, please contact [place] Please consult [email or web address] for contact info under Cardiology/STEMI.      Signed, Glenetta Hew, MD  10/18/2017, 9:14 AM

## 2017-10-18 NOTE — Progress Notes (Signed)
CRITICAL VALUE ALERT  Critical Value: trop 1.48  Date & Time Notied: 8/27 4961  Provider Notified: Tamala Julian MD  Orders Received/Actions taken: recheck trop, monitor pt

## 2017-10-18 NOTE — Care Management (Signed)
10-18-17  BENEFITS CHECK :  #  9.  S/W MARIA  @ [facility] # [phone]  BRILINTA  90 MG BID COVER- YES CO-PAY- $ 45.00 TIER- 3 DRUG PRIOR APPROVAL- NO  PREFERRED PHARMACY : YES   [facility]

## 2017-10-18 NOTE — Care Management (Signed)
10-18-17  BENEFITS CHECK:  #  3.   S/W MARIA   @ [place] [facility] # [phone]  1. ELIQUIS  2.5 MG BID  COVER- YES CO-PAY- $ 45.00 TIER- 3 DRUG PRIOR APPROVAL-NO  2. ELIQUIS  5 MG BID COVER- YES CO-PAY- $ 45.00 TIER- 3 DRUG PRIOR APPROVAL- NO  PREFERRED PHARMACY : YES   [facility]

## 2017-10-19 ENCOUNTER — Encounter (HOSPITAL_COMMUNITY): Payer: Self-pay | Admitting: Physician Assistant

## 2017-10-19 DIAGNOSIS — Z794 Long term (current) use of insulin: Secondary | ICD-10-CM

## 2017-10-19 DIAGNOSIS — E118 Type 2 diabetes mellitus with unspecified complications: Secondary | ICD-10-CM

## 2017-10-19 DIAGNOSIS — E1169 Type 2 diabetes mellitus with other specified complication: Secondary | ICD-10-CM

## 2017-10-19 LAB — CBC
HCT: 37.6 % — ABNORMAL LOW (ref 39.0–52.0)
Hemoglobin: 12.1 g/dL — ABNORMAL LOW (ref 13.0–17.0)
MCH: 28.4 pg (ref 26.0–34.0)
MCHC: 32.2 g/dL (ref 30.0–36.0)
MCV: 88.3 fL (ref 78.0–100.0)
PLATELETS: 165 10*3/uL (ref 150–400)
RBC: 4.26 MIL/uL (ref 4.22–5.81)
RDW: 14.5 % (ref 11.5–15.5)
WBC: 7.9 10*3/uL (ref 4.0–10.5)

## 2017-10-19 LAB — GLUCOSE, CAPILLARY
GLUCOSE-CAPILLARY: 201 mg/dL — AB (ref 70–99)
Glucose-Capillary: 292 mg/dL — ABNORMAL HIGH (ref 70–99)

## 2017-10-19 LAB — TROPONIN I: TROPONIN I: 1.43 ng/mL — AB (ref <0.03)

## 2017-10-19 MED ORDER — NITROGLYCERIN 0.4 MG SL SUBL: 0.4000 mg | SUBLINGUAL_TABLET | SUBLINGUAL | 2 refills | Status: DC | PRN

## 2017-10-19 MED ORDER — HYDRALAZINE HCL 25 MG PO TABS: 25.0000 mg | ORAL_TABLET | Freq: Four times a day (QID) | ORAL | 6 refills | Status: DC | PRN

## 2017-10-19 MED ORDER — IRBESARTAN 75 MG PO TABS: 75.0000 mg | ORAL_TABLET | Freq: Every day | ORAL | 6 refills | Status: DC

## 2017-10-19 MED ORDER — TICAGRELOR 90 MG PO TABS: 90.0000 mg | ORAL_TABLET | Freq: Two times a day (BID) | ORAL | 3 refills | Status: DC

## 2017-10-19 MED ORDER — METFORMIN HCL 1000 MG PO TABS: 1000.0000 mg | ORAL_TABLET | Freq: Two times a day (BID) | ORAL | Status: DC

## 2017-10-19 MED ORDER — ASPIRIN 81 MG PO CHEW: 81.0000 mg | CHEWABLE_TABLET | Freq: Every day | ORAL | 6 refills | Status: DC

## 2017-10-19 MED ORDER — METOPROLOL TARTRATE 100 MG PO TABS: 100.0000 mg | ORAL_TABLET | Freq: Two times a day (BID) | ORAL | 6 refills | Status: DC

## 2017-10-19 MED ORDER — ISOSORBIDE MONONITRATE ER 30 MG PO TB24: 30.0000 mg | ORAL_TABLET | Freq: Every day | ORAL | 6 refills | Status: DC

## 2017-10-19 MED ORDER — TICAGRELOR 90 MG PO TABS: 90.0000 mg | ORAL_TABLET | Freq: Two times a day (BID) | ORAL | 0 refills | Status: DC

## 2017-10-19 NOTE — Discharge Summary (Addendum)
The patient has been seen in conjunction with Lorenso Quarry, PA-C. All aspects of care have been considered and discussed. The patient has been personally interviewed, examined, and all clinical data has been reviewed.   Residual disease despite two-vessel PCI.  Had prolonged back and chest discomfort postprocedure without significant enzyme rise.  EKG has remained normal.  Long discussion concerning risk factor modification: Lipids, hemoglobin A1c, blood pressure, and physical activity.  He should be started on PC SK 9 therapy for lipid management.  He is intolerant of statins.  LDL target less than 7.  Blood pressure less than 130/80 mmHg.  Instructed to use nitroglycerin if recurrent chest discomfort and report to his primary cardiologist, [name].  Radial access site is unremarkable.  He is ready for discharge.  Discharge Summary    Patient ID: Richard [name],  MRN: [id number], DOB/AGE: May 02, 1945 72 y.o.  Admit date: 10/16/2017 Discharge date: 10/19/2017  Primary Care Provider: Dione Housekeeper Primary Cardiologist: No primary care provider on file.  Discharge Diagnoses    Principal Problem:   Unstable angina (HCC) Active Problems:   Essential hypertension   Type 2 diabetes mellitus with complication, with long-term current use of insulin (HCC)   Coronary artery disease involving native coronary artery of native heart with unstable angina pectoris (HCC)   Atrial flutter ([place]) - s/p ablation   Chest pain   Hyperlipidemia associated with type 2 diabetes mellitus (HCC) - Statin intolerant   Allergies Allergies  Allergen Reactions  . Plavix [Clopidogrel]     Leg pain  . Statins     Leg pain    Diagnostic Studies/Procedures    10/17/2017 LHC with PCI  Prox RCA lesion is 15% stenosed.  Mid RCA lesion is 30% stenosed.  Mid LM lesion is 25% stenosed.  Mid LAD lesion is 85% stenosed.  A drug-eluting stent was successfully placed using a STENT SYNERGY  DES 2.75X16.  Post intervention, there is a 0% residual stenosis.  Ost 2nd Mrg lesion is 50% stenosed.  Mid Cx lesion is 45% stenosed.  3rd Mrg lesion is 85% stenosed.  A drug-eluting stent was successfully placed using a STENT SYNERGY DES 3X24.  Post intervention, there is a 0% residual stenosis.  LV end diastolic pressure is normal.   1. Severe 2 vessel obstructive CAD- patient has diffuse CAD    - 85% mid LAD    - 85% large OM2 2. Patent stents in RCA.  3. Normal LVEDP 4. Successful PCI of the mid LAD with DES 5. Successful PCI of the OM2  Plan: aggressive risk factor modification. Need to stress compliance with medical therapy. DAPT for one year. Since he has no documented atrial arrhythmia since his ablation I would not resume DOAC. Anticipate DC in am  Recommend uninterrupted dual antiplatelet therapy with Aspirin 81mg  daily and Ticagrelor 90mg  twice daily for a minimum of 12 months (ACS - Class I recommendation).   History of Present Illness     72 yo male with history significant for CAD s/p PCI and RCA stenting x2 of his proximal mid RCA using Cypher drug-eluting stents March 2007. Follow-up cardiac catheterization in 2008 showed 2 patent stents and normal LAD / Cfx coronary arteries with a dominant RCA. LVEF 45-50%. In 2008, the patient underwent an ablation by Dr. Caryl Comes for atrial flutter. In 06/2017, he suffered a right sided TIA but did not continue medical management / anticoagulation with Eliquis due to cost, and instead he self medicated  with ASA BID. He is not taking his home Repatha. His past medical history is also significant for uncontrolled hypertension on Metoprolol 100 mg daily, hyperlipidemia with LDL 191 not at goal and reported statin intolerance, poorly controlled diabetes with A1C 11.5 in May 2019, and COPD s/p quitting smoking in May 2019 after a long-standing tobacco use history.   Most recent CV studies 06/28/17 showed normal EF 55-60% and was consistent  with grade 1 diastolic dysfunction and moderate LV. Patient hasn't been followed by Cardiology and reportedly has not taken very good care of himself, stating that he loves to eat and is not complaint with his atorvastatin. He reported he stopped anticoagulation with Plavix after his 2007 stenting with c/o muscle aches. He also stopped Eliquis after his 2019 TIA.   The patient has a h/o ongoing, intermittent and progressively worse exertional and anterior chest pressure that reportedly radiates to his jaw, neck, and back of head and is associated with shortness of breath. On 10/16/2017, patient experienced three episodes of worsening (progressing to severe) substernal chest discomfort that started at rest and around 3PM that same day. He reported each episode lasted ~ 2 minutes, was worse in severity, and occured 15 minutes following the previous one. He further described the chest pain as a substernal pressure / tightness and associated with some shortness of breath. He denied n/v/d. He took nitroglycerin after the third episode with some but not complete alleviation of the pain / discomfort; however, the tightness / pain returned by the time he had presented to the [facility] Emergency Department for the chest pressure / discomfort.   Hospital Course     In the [facility] Emergency Department, the patient received nitroglycerin paste for chest pressure and ASA for associated HA likely also due to the nitroglycerin. Labs were noted to be significant for negative troponin x3 and EKG without acute ST segment changes. The patient continued to experience pain, and cardiac diagnostic testing and noninvasive and invasive techniques were discussed with the patient with patient consent to proceed with diagnostic cardiac catheretization with possible revascularization.   On 8/26, cardiac catheterization was successfully performed by Dr. Martin[place] without complication and with results as above. Catheterization revealed  moderate to severe disease in all 3 vessels with severe 2 vessel obstructive disease: 85% mid LAD and 85% large OM2 / second obtuse marginal. It also showed patent stents in RCA, as well as normal LVEDP. Successful stenting was subsequently performed with DES to the mid LAD and OM2. Per documentation, patient was counseled regarding compliance with medical therapy given his history of non-compliance with DAPT for one year. This documentation also noted that, since the patient has not yet had a documented atrial arrhythmia since his ablation, it was not necessary to resume DOAC.   Following cardiac catheterization, the patient reported ongoing chest pressure with mid sternal ache, radiating to his jaw, and with associated shortness of breath. His blood pressure remained elevated post procedure with systolic pressures in the 160s-170s, and EKG was documented as significant for NSR without any acute changes. Medication adjustments were made and patient received labetalol, IV nitro, IV fentanyl, hydralazine, morphine for pain. He was also placed on 2L [place] for shortness of breath. Of note, on further questioning of the patient, it was discovered that the shortness of breath occurred after taking Brillinta.   Patient is planned for discharge 8/28 with plan for dual antiplatelet therapy of Aspirin 81mg  daily and Ticagrelor 90mg  twice daily for a minimum  of 12 months (ACS - Class I recommendation). His discharge medications also include metoprolol tartrate and nitrates for additional chest pain management prn. Patient has also been informed to hold his metformin for 24 hours following the procedure. Patient has new documentation of statin intolerance, and after discussion with pharmacist, he will be scheduled to follow-up with the lipid clinic for further management and medication recommendations. Aggressive risk factor modification and lifestyle changes have been discussed with the patient.   The patient has been  examined by Dr. Daneen Schick today 10/19/2017 and does not report chest pain / pressure or shortness of breath. Per Dr. Thompson Caul note, cardiac catheterization site at patient's right wrist was examined and noted to be without swelling / infection with intact, clean, and dry dressing. He completed cardiac rehabilitation phase I and will be scheduled to complete cardiac rehabilitation phase 2, as well as for outpatient cardiology follow-up and labs in [place] with [name]. Patient will be discharged home and in good condition.   Discharge Vitals Blood pressure 131/71, pulse 67, temperature 98.1 F (36.7 C), temperature source Oral, resp. rate 19, height 6' (1.829 m), weight 87.5 kg, SpO2 98 %.  Filed Weights   10/16/17 2054 10/17/17 1243 10/17/17 1735  Weight: 86 kg 86.2 kg 87.5 kg    Labs & Radiologic Studies    CBC Recent Labs    10/18/17 0211 10/19/17 0229  WBC 9.3 7.9  HGB 12.0* 12.1*  HCT 37.0* 37.6*  MCV 88.1 88.3  PLT 152 732   Basic Metabolic Panel Recent Labs    10/17/17 0548 10/18/17 0211  NA 139 138  K 4.2 4.3  CL 106 107  CO2 26 23  GLUCOSE 273* 304*  BUN 21 16  CREATININE 1.04 1.20  CALCIUM 8.7* 8.6*   Liver Function Tests No results for input(s): AST, ALT, ALKPHOS, BILITOT, PROT, ALBUMIN in the last 72 hours. No results for input(s): LIPASE, AMYLASE in the last 72 hours. Cardiac Enzymes Recent Labs    10/17/17 1040 10/18/17 0845 10/19/17 0753  TROPONINI <0.03 1.48* 1.43*   BNP Invalid input(s): POCBNP D-Dimer No results for input(s): DDIMER in the last 72 hours. Hemoglobin A1C Recent Labs    10/18/17 0211  HGBA1C 9.6*   Fasting Lipid Panel No results for input(s): CHOL, HDL, LDLCALC, TRIG, CHOLHDL, LDLDIRECT in the last 72 hours. Thyroid Function Tests No results for input(s): TSH, T4TOTAL, T3FREE, THYROIDAB in the last 72 hours.  Invalid input(s): FREET3 _____________  Dg Chest 2 View  Result Date: 10/16/2017 CLINICAL DATA:  Chest  copper, chest pain. EXAM: CHEST - 2 VIEW COMPARISON:  Chest x-rays dated 06/28/2017 and 10/22/2013. FINDINGS: The heart size and mediastinal contours are within normal limits. Both lungs are clear. The visualized skeletal structures are unremarkable. IMPRESSION: No active cardiopulmonary disease. No evidence of pneumonia or pulmonary edema. Electronically Signed   By: Franki Cabot M.D.   On: 10/16/2017 17:04   Disposition   Pt is being discharged home today in good condition.  Follow-up Plans & Appointments    Follow-up Information    Satira Sark, MD Follow up.   Specialty:  Cardiology Why:  Our office will call you to schedule the office follow-up visit.   Please call the office at [phone] if you have not heard from the office within a week of discharge.  Contact information: [place] [address] [phone]          Discharge Instructions    Amb  Referral to Cardiac Rehabilitation   Complete by:  As directed    Diagnosis:  Coronary Stents      Discharge Medications   Allergies as of 10/19/2017      Reactions   Plavix [clopidogrel]    Leg pain   Statins    Leg pain      Medication List    STOP taking these medications   apixaban 5 MG Tabs tablet Commonly known as:  ELIQUIS   aspirin 325 MG tablet Replaced by:  aspirin 81 MG chewable tablet   Evolocumab 140 MG/ML [place]   ONE TOUCH ULTRA TEST test strip Generic drug:  glucose blood     TAKE these medications   aspirin 81 MG chewable tablet Chew 1 tablet (81 mg total) by mouth daily. Start taking on:  10/20/2017 Replaces:  aspirin 325 MG tablet   glipiZIDE 10 MG tablet Commonly known as:  GLUCOTROL Take 10 mg by mouth daily.   hydrALAZINE 25 MG tablet Commonly known as:  APRESOLINE Take 1 tablet (25 mg total) by mouth every 6 (six) hours as needed (BP > 160/100).   irbesartan 75 MG tablet Commonly known as:  AVAPRO Take 1 tablet (75 mg total) by mouth daily. Start  taking on:  10/20/2017   isosorbide mononitrate 30 MG 24 hr tablet Commonly known as:  IMDUR Take 1 tablet (30 mg total) by mouth daily. Start taking on:  10/20/2017   metFORMIN 1000 MG tablet Commonly known as:  GLUCOPHAGE Take 1 tablet (1,000 mg total) by mouth 2 (two) times daily. Start taking on:  10/20/2017   metoprolol tartrate 100 MG tablet Commonly known as:  LOPRESSOR Take 1 tablet (100 mg total) by mouth 2 (two) times daily. What changed:  when to take this Notes to patient:  Do not take your metformin for 48 hours after your procedure. You may resume your metformin after 48 hours as regularly prescribed.   nitroGLYCERIN 0.4 MG SL tablet Commonly known as:  NITROSTAT Place 1 tablet (0.4 mg total) under the tongue every 5 (five) minutes as needed for chest pain. What changed:  See the new instructions. Notes to patient:  Take up to three times daily as needed for chest pain. If taking your third dose, call 911.    pioglitazone 15 MG tablet Commonly known as:  ACTOS Take 15 mg by mouth daily.   ticagrelor 90 MG Tabs tablet Commonly known as:  BRILINTA Take 1 tablet (90 mg total) by mouth every 12 (twelve) hours.   ticagrelor 90 MG Tabs tablet Commonly known as:  BRILINTA Take 1 tablet (90 mg total) by mouth 2 (two) times daily.        Acute coronary syndrome (MI, NSTEMI, STEMI, etc) this admission?: Yes.     AHA/ACC Clinical Performance & Quality Measures: 6. Aspirin prescribed? - Yes 7. ADP Receptor Inhibitor (Plavix/Clopidogrel, Brilinta/Ticagrelor or Effient/Prasugrel) prescribed (includes medically managed patients)? - Yes 8. Beta Blocker prescribed? - Yes 9. High Intensity Statin (Lipitor 40-80mg  or Crestor 20-40mg ) prescribed? - No - Lipid clinic due to statin intolerance for managment of lipids 10. EF assessed during THIS hospitalization? - Yes 11. For EF <40%, was ACEI/ARB prescribed? - Not Applicable (EF >/= 72%) 12. For EF <40%, Aldosterone Antagonist  (Spironolactone or Eplerenone) prescribed? - Not Applicable (EF >/= 09%) 13. Cardiac Rehab Phase II ordered (Included Medically managed Patients)? - Yes     Outstanding Labs/Studies   Patient will need BMET and CBC at post hospital follow-up.  Duration of Discharge Encounter   Greater than 30 minutes including physician time.  Signed, Arvil Chaco, PA-C 10/19/2017, 12:47 PM Pager [phone]

## 2017-10-19 NOTE — Care Management Note (Signed)
Case Management Note  Patient Details  Name: GEDALIA MCMILLON MRN: [id number] Date of Birth: April 24, 1945  Subjective/Objective:   Pt admitted with CP - s/p cath and stent placement                Action/Plan:  PTA independent from home, previously on Eliquis however pt will discharge home only Brilinta.  CM submitted benefit check - results documented in Epic.  CM provided free 30 day card to pt and informed of Copay post initial 30 days, pt also encouraged to contact [employer] for possible assistance when he is in the donut whole.  However, pt informed CM that he contacted manufacturer of Eliquis for assistance and was denied due to property ownership.  Pt has PCP.   Expected Discharge Date:  10/17/17               Expected Discharge Plan:  Home/Self Care  In-House Referral:     Discharge planning Services  CM Consult, Medication Assistance  Post Acute Care Choice:    Choice offered to:     DME Arranged:    DME Agency:     HH Arranged:    HH Agency:     Status of Service:     If discussed at [employer], dates discussed:    Additional Comments:  Maryclare Labrador, RN 10/19/2017, 8:04 AM

## 2017-10-19 NOTE — Progress Notes (Signed)
Removed PIV access and pt & pt's wife received the discharge instructions. They understood well. Pt took his all belongings. HS [employer]

## 2017-10-19 NOTE — Progress Notes (Signed)
Progress Note  Patient Name: Richard [name] Date of Encounter: 10/19/2017  Primary Cardiologist:McDowell/Berry  Subjective   Troponin yesterday was noted to be elevated to 1.5.  Nitroglycerin was discontinued and he was able to ambulate with rehab.  Relatively quiet night.  Inpatient Medications    Scheduled Meds: . aspirin  81 mg Oral Daily  . insulin aspart  0-9 Units Subcutaneous TID AC & HS  . irbesartan  75 mg Oral Daily  . isosorbide mononitrate  30 mg Oral Daily  . loratadine  10 mg Oral Daily  . metoprolol tartrate  100 mg Oral BID  . sodium chloride flush  3 mL Intravenous Q12H  . sodium chloride flush  3 mL Intravenous Q12H  . ticagrelor  90 mg Oral Q12H   Continuous Infusions: . sodium chloride    . sodium chloride     PRN Meds: sodium chloride, sodium chloride, acetaminophen **OR** acetaminophen, albuterol, hydrALAZINE, nitroGLYCERIN, ondansetron **OR** ondansetron (ZOFRAN) IV, polyethylene glycol, sodium chloride flush, sodium chloride flush, traZODone   Vital Signs    Vitals:   10/18/17 2326 10/19/17 0402 10/19/17 0453 10/19/17 0748  BP: (!) 142/77 (!) 152/80 138/72 (!) 155/85  Pulse:  76 78 76  Resp: 15 (!) 22 (!) 22 16  Temp: 98.3 F (36.8 C) 98.3 F (36.8 C)  98.2 F (36.8 C)  TempSrc: Oral Oral  Oral  SpO2: 96% 99%  97%  Weight:      Height:        Intake/Output Summary (Last 24 hours) at 10/19/2017 0806 Last data filed at 10/19/2017 0402 Gross per 24 hour  Intake 1100 ml  Output 1600 ml  Net -500 ml   Filed Weights   10/16/17 2054 10/17/17 1243 10/17/17 1735  Weight: 86 kg 86.2 kg 87.5 kg    Telemetry    Sinus rhythm and sinus bradycardia- Personally Reviewed  ECG    Sinus rhythm, prominent voltage, overall normal in appearance.- Personally Reviewed  Physical Exam  Lying comfortably. GEN: No acute distress.   Neck: No JVD Cardiac: RRR, without rub. Respiratory: Clear to auscultation bilaterally. GI: Soft, nontender,  non-distended  MS:  Radial access site is unremarkable. Neuro:  Nonfocal  Psych: Normal affect   Labs    Chemistry Recent Labs  Lab 10/16/17 1623 10/17/17 0548 10/18/17 0211  NA 138 139 138  K 4.5 4.2 4.3  CL 103 106 107  CO2 28 26 23   GLUCOSE 325* 273* 304*  BUN 23 21 16   CREATININE 1.43* 1.04 1.20  CALCIUM 8.6* 8.7* 8.6*  GFRNONAA 48* >60 59*  GFRAA 55* >60 >60  ANIONGAP 7 7 8      Hematology Recent Labs  Lab 10/17/17 0548 10/18/17 0211 10/19/17 0229  WBC 5.6 9.3 7.9  RBC 4.30 4.20* 4.26  HGB 12.5* 12.0* 12.1*  HCT 37.4* 37.0* 37.6*  MCV 87.0 88.1 88.3  MCH 29.1 28.6 28.4  MCHC 33.4 32.4 32.2  RDW 14.4 14.5 14.5  PLT 137* 152 165    Cardiac Enzymes Recent Labs  Lab 10/16/17 2326 10/17/17 0548 10/17/17 1040 10/18/17 0845  TROPONINI <0.03 <0.03 <0.03 1.48*   No results for input(s): TROPIPOC in the last 168 hours.   BNPNo results for input(s): BNP, PROBNP in the last 168 hours.   DDimer No results for input(s): DDIMER in the last 168 hours.   Radiology    No results found.  Cardiac Studies   Cardiac catheterization 10/17/2017: Diagnostic Diagram  Post-Intervention Diagram        Diffuse disease noted proximal to obtuse marginal stent and residual ostial disease and obtuse marginal #1.  Patient Profile     72 y.o. male with a hx of CAD who is being seen today for the evaluation of chest pain at the request of Dr.Emokpae.  Cath demonstrated diffuse moderate to severe disease in all 3 vessels with high-grade obstruction in the mid LAD and second obtuse marginal which were both treated with DES by Dr. Martinique 10/18/2017.  Patient had persistent chest pain post procedure last evening.  Assessment & Plan    1.  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  Stable post to site PCI. 2.  Persistent chest and back discomfort resolved.   3.  Diabetes mellitus type 2 with poor control.  Has received instruction while in hospital  concerning importance of therapy. 4.  Severe hyperlipidemia with LDL greater than 190. 5.  Dyspnea correlating with Brilinta dosing.  States that he is adjusted to Brilinta and that shortness of breath is no longer a problem. 6.  Elevated troponin consistent with PCI related enzyme elevation and not MI  Plan discharge today.   Plan aspirin and Brilinta for 12 months.  Consider PCSK9 therapy to lower lipids to target.  Discussed aggressive risk factor modification.  Phase 2 cardiac rehab would also be appropriate.  Is to follow-up in [place] with [name].   For questions or updates, please contact [place] Please consult [email or web address] for contact info under Cardiology/STEMI.      Signed, Sinclair Grooms, MD  10/19/2017, 8:06 AM

## 2017-10-19 NOTE — Progress Notes (Signed)
Inpatient Diabetes Program Recommendations  AACE/ADA: New Consensus Statement on Inpatient Glycemic Control (2015)  Target Ranges:  Prepandial:   less than 140 mg/dL      Peak postprandial:   less than 180 mg/dL (1-2 hours)      Critically ill patients:  140 - 180 mg/dL   Lab Results  Component Value Date   GLUCAP 201 (H) 10/19/2017   HGBA1C 9.6 (H) 10/18/2017    Review of Glycemic ControlResults for Richard [name], Richard [name] (MRN [id number]) as of 10/19/2017 11:10  Ref. Range 10/18/2017 08:06 10/18/2017 11:27 10/18/2017 16:20 10/18/2017 21:50 10/19/2017 07:48  Glucose-Capillary Latest Ref Range: 70 - 99 mg/dL 228 (H) 201 (H) 306 (H) 251 (H) 201 (H)   Note plans for patient to d/c home today.  Briefly visited with patient to discuss A1C.  Informed him that A1C does look better and has moved from 11.5% down to 9.6%.  We discussed that he needs to follow-up with his PCP.  I also informed him that goal A1C=7%.  He states "I have been working hard, but I have to eat".  Congratulated him for the efforts he has made and encouraged him to continue.   Thanks,  Adah Perl, RN, BC-ADM Inpatient Diabetes Coordinator Pager [phone] (8a-5p)

## 2017-10-19 NOTE — Discharge Instructions (Signed)
Radial Site Care Refer to this sheet in the next few weeks. These instructions provide you with information on caring for yourself after your procedure. Your caregiver may also give you more specific instructions. Your treatment has been planned according to current medical practices, but problems sometimes occur. Call your caregiver if you have any problems or questions after your procedure. HOME CARE INSTRUCTIONS  You may shower the day after the procedure.Remove the bandage (dressing) and gently wash the site with plain soap and water.Gently pat the site dry.   Do not apply powder or lotion to the site.   Do not submerge the affected site in water for 3 to 5 days.   Inspect the site at least twice daily.   Do not flex or bend the affected arm for 24 hours.   No lifting over 5 pounds (2.3 kg) for 5 days after your procedure.   Do not drive home if you are discharged the same day of the procedure. Have someone else drive you. What to expect:  Any bruising will usually fade within 1 to 2 weeks.   Blood that collects in the tissue (hematoma) may be painful to the touch. It should usually decrease in size and tenderness within 1 to 2 weeks.  SEEK IMMEDIATE MEDICAL CARE IF:  You have unusual pain at the radial site.   You have redness, warmth, swelling, or pain at the radial site.   You have drainage (other than a small amount of blood on the dressing).   You have chills.   You have a fever or persistent symptoms for more than 72 hours.   You have a fever and your symptoms suddenly get worse.   Your arm becomes pale, cool, tingly, or numb.   You have heavy bleeding from the site. Hold pressure on the site.    NO HEAVY LIFTING OR SEXUAL ACTIVITY X 7 DAYS. NO DRIVING X 2-3 DAYS. NO SOAKING BATHS, HOT TUBS, POOLS, ETC., X 7 DAYS. Keep procedure site clean & dry. If you notice increased pain, swelling, bleeding or pus, call/return!  You may shower, but no soaking baths/hot  tubs/pools for 1 week.     **PLEASE REMEMBER TO BRING ALL OF YOUR MEDICATIONS TO EACH OF YOUR FOLLOW-UP OFFICE VISITS.     10 Habits of Highly Healthy [place] wants to help you get well and stay well.  Live a longer, healthier life by practicing healthy habits every day.  1.  Visit your primary care provider regularly. 2.  Make time for family and friends.  Healthy relationships are important. 3.  Take medications as directed by your provider. 4.  Maintain a healthy weight and a trim waistline. 5.  Eat healthy meals and snacks, rich in fruits, vegetables, whole grains, and lean proteins. 6.  Get moving every day - aim for 150 minutes of moderate physical activity each week. 7.  Don't smoke. 8.  Avoid alcohol or drink in moderation. 9.  Manage stress through meditation or mindful relaxation. 10.  Get seven to nine hours of quality sleep each night.  Want more information on healthy habits?  To learn more about these and other healthy habits, visit [email or web address]. _____________      Dennis Bast have received care from [facility] during this hospital stay and we look forward to continuing to provide you with excellent care in our office settings after you've left the hospital.  In order to assure a smoother transition to  home following your discharge from the hospital, we will likely have you see one of our nurse practitioners or physician assistants within a few weeks of discharge.  Our advanced practice providers work closely with your physician in order to address all of your heart's needs in a timely manner.  More information about all of our providers may be found here: [email or web address]  Please plan to bring all of your prescriptions to your follow-up appointment and don't hesitate to contact us with questions or concerns.  [facility] [place] - [phone] - [place] - [place] - [phone] [facility] [place] - [phone] [facility] [place] [phone] [place] [phone]

## 2017-10-24 NOTE — Progress Notes (Signed)
Cardiology Office Note    Date:  10/25/2017   ID:  Richard [name], DOB 12/09/1945, MRN [id number]  PCP:  Dione Housekeeper, MD  Cardiologist: Rozann Lesches, MD    Chief Complaint  Patient presents with  . Hospitalization Follow-up    History of Present Illness:    Richard [name] is a 72 y.o. male with past medical history of CAD (s/p DES to RCA in 2007), atrial flutter (s/p ablation in 2008), HTN, HLD, Type 2 DM, prior TIA, and former tobacco use (quit in 06/2017) who presents to the office today for hospital follow-up.  He was recently admitted to [facility] on 10/16/2017 for evaluation of chest pain with associated discomfort radiating up to his jaw. Reported symptoms occurred with exertion and were relieved with sublingual nitroglycerin. Cyclic troponin values were negative and his EKG showed no acute ischemic changes. Given his presenting symptoms and known CAD, he was transferred to [facility] for a cardiac catheterization. This was performed by Dr. Martinique and showed 85% mid LAD stenosis, 25% LM stenosis, 30% mid RCA stenosis, 50% 2nd Mrg, 85% 2nd Mrg, and 45% LCx stenosis. He underwent PCI/DES placement to the mid-LAD and OM2. He was started on DAPT with ASA and Brilinta and DOAC was not resumed given no documented atrial arrhythmia since his ablation. Troponin values following his cath were noted to be elevated to 1.48 which was thought to be PCI related. He did have pain following his procedure in the setting of elevated BP and required IV NTG which was transitioned to Imdur prior to discharge. Was not started on statin therapy given his history of statin intolerance (LDL 191 in 06/2017) and referral to the [facility] as an outpatient was recommended.    In talking with the patient today, he reports having frequent episodes of acute dyspnea which will occur for several minutes and spontaneously resolve. Reports this started while in the hospital and was thought to be due to Brilinta and  symptoms have not improved with the use of caffeine. Episodes occur 20-30 minutes after taking Brilinta and typically resolve within an hour. No association with exertion or positional changes. He denies any recurrent episodes of chest discomfort. No associated orthopnea, PND, lower extremity edema, or palpitations.  He asks several questions about his current medication regimen as he wishes to stop these as soon as possible. He does report good compliance with ASA and Brilinta and denies missing any recent doses. No evidence of active bleeding. His wife who is a former nurse has been following his blood pressure at home and says this has overall been well-controlled. Is slightly elevated at 142/74 during today's visit.   Past Medical History:  Diagnosis Date  . CAD (coronary artery disease)    a. 2007 - DES x 2 proximal to mid RCA b. 09/2017: DES to mid LAD and OM2. Patent stents along RCA.   . Essential hypertension   . History of atrial flutter    Status post ablation 2008 - Dr. Caryl Comes  . History of transient ischemic attack (TIA)   . Mixed hyperlipidemia    Statin intolerance  . Pneumonia   . Type 2 diabetes mellitus (HCC)    A1C 11.5 06/2017    Past Surgical History:  Procedure Laterality Date  . BALLOON ANGIOPLASTY, ARTERY    . CARDIAC ELECTROPHYSIOLOGY MAPPING AND ABLATION    . CARPAL TUNNEL RELEASE Bilateral   . cervical neck fusion     x 4  .  CORONARY STENT INTERVENTION N/A 10/17/2017   Procedure: CORONARY STENT INTERVENTION;  Surgeon: Martinique, Peter M, MD;  Location: [place] [facility];  Service: Cardiovascular;  Laterality: N/A;  . CORONARY STENT PLACEMENT     x 2  . LEFT HEART CATH AND CORONARY ANGIOGRAPHY N/A 10/17/2017   Procedure: LEFT HEART CATH AND CORONARY ANGIOGRAPHY;  Surgeon: Martinique, Peter M, MD;  Location: [place] [facility];  Service: Cardiovascular;  Laterality: N/A;  . ROTATOR CUFF REPAIR Right    x 4  . TIBIA FRACTURE SURGERY Left     Current  Medications: Outpatient Medications Prior to Visit  Medication Sig Dispense Refill  . glipiZIDE (GLUCOTROL) 10 MG tablet Take 10 mg by mouth daily.     . hydrALAZINE (APRESOLINE) 25 MG tablet Take 1 tablet (25 mg total) by mouth every 6 (six) hours as needed (BP > 160/100). 120 tablet 6  . irbesartan (AVAPRO) 75 MG tablet Take 1 tablet (75 mg total) by mouth daily. 30 tablet 6  . isosorbide mononitrate (IMDUR) 30 MG 24 hr tablet Take 1 tablet (30 mg total) by mouth daily. 30 tablet 6  . metFORMIN (GLUCOPHAGE) 1000 MG tablet Take 1 tablet (1,000 mg total) by mouth 2 (two) times daily.    . metoprolol tartrate (LOPRESSOR) 100 MG tablet Take 1 tablet (100 mg total) by mouth 2 (two) times daily. 60 tablet 6  . nitroGLYCERIN (NITROSTAT) 0.4 MG SL tablet Place 1 tablet (0.4 mg total) under the tongue every 5 (five) minutes as needed for chest pain. 25 tablet 2  . pioglitazone (ACTOS) 15 MG tablet Take 15 mg by mouth daily.     [name] aspirin 81 MG chewable tablet Chew 1 tablet (81 mg total) by mouth daily. 30 tablet 6  . ticagrelor (BRILINTA) 90 MG TABS tablet Take 1 tablet (90 mg total) by mouth every 12 (twelve) hours. 180 tablet 3  . ticagrelor (BRILINTA) 90 MG TABS tablet Take 1 tablet (90 mg total) by mouth 2 (two) times daily. 60 tablet 0   No facility-administered medications prior to visit.      Allergies:   Statins   Social History   Socioeconomic History  . Marital status: Married    Spouse name: Not on file  . Number of children: 4  . Years of education: Not on file  . Highest education level: Not on file  Occupational History  . Occupation: truck Animator Needs  . Financial resource strain: Not on file  . Food insecurity:    Worry: Not on file    Inability: Not on file  . Transportation needs:    Medical: Not on file    Non-medical: Not on file  Tobacco Use  . Smoking status: Former Smoker    Packs/day: 1.00    Years: 54.00    Pack years: 54.00    Types: Cigarettes     Last attempt to quit: 07/11/2017    Years since quitting: 0.2  . Smokeless tobacco: Never Used  Substance and Sexual Activity  . Alcohol use: No    Alcohol/week: 0.0 standard drinks    Comment: rare  . Drug use: No  . Sexual activity: Not on file  Lifestyle  . Physical activity:    Days per week: Not on file    Minutes per session: Not on file  . Stress: Not on file  Relationships  . Social connections:    Talks on phone: Not on file    Gets together:  Not on file    Attends religious service: Not on file    Active member of club or organization: Not on file    Attends meetings of clubs or organizations: Not on file    Relationship status: Not on file  Other Topics Concern  . Not on file  Social History Narrative  . Not on file     Family History:  The patient's family history includes Diabetes in his mother; Emphysema in his father; Heart disease in his brother and mother; Lung cancer in his brother.   Review of Systems:   Please see the history of present illness.     General:  No chills, fever, night sweats or weight changes.  Cardiovascular:  No chest pain, edema, orthopnea, palpitations, paroxysmal nocturnal dyspnea. Positive for episodic dyspnea.  Dermatological: No rash, lesions/masses Respiratory: No cough. Urologic: No hematuria, dysuria Abdominal:   No nausea, vomiting, diarrhea, bright red blood per rectum, melena, or hematemesis Neurologic:  No visual changes, wkns, changes in mental status. All other systems reviewed and are otherwise negative except as noted above.   Physical Exam:    VS:  BP (!) 142/74 (BP Location: Left Arm)   Pulse 68   Ht 6' (1.829 m)   Wt 189 lb (85.7 kg)   SpO2 98%   BMI 25.63 kg/m    General: Well developed, well nourished Caucasian male appearing in no acute distress. Head: Normocephalic, atraumatic, sclera non-icteric, no xanthomas, nares are without discharge.  Neck: No carotid bruits. JVD not elevated.  Lungs:  Respirations regular and unlabored, without wheezes or rales.  Heart: Regular rate and rhythm. No S3 or S4.  No murmur, no rubs, or gallops appreciated. Abdomen: Soft, non-tender, non-distended with normoactive bowel sounds. No hepatomegaly. No rebound/guarding. No obvious abdominal masses. Msk:  Strength and tone appear normal for age. No joint deformities or effusions. Extremities: No clubbing or cyanosis. No lower extremity edema.  Distal pedal pulses are 2+ bilaterally. Radial site stable with slight ecchymosis. No evidence of a hematoma. Neuro: Alert and oriented X 3. Moves all extremities spontaneously. No focal deficits noted. Psych:  Responds to questions appropriately with a normal affect. Skin: No rashes or lesions noted  Wt Readings from Last 3 Encounters:  10/25/17 189 lb (85.7 kg)  10/17/17 192 lb 14.4 oz (87.5 kg)  08/16/17 192 lb (87.1 kg)     Studies/Labs Reviewed:   EKG:  EKG is ordered today. The ekg ordered today demonstrates NSR, HR 65, with no acute ST changes when compared to prior tracings.   Recent Labs: 06/27/2017: ALT 20; TSH 3.672 10/18/2017: BUN 16; Creatinine, Ser 1.20; Potassium 4.3; Sodium 138 10/19/2017: Hemoglobin 12.1; Platelets 165   Lipid Panel    Component Value Date/Time   CHOL 262 (H) 06/28/2017 0723   TRIG 159 (H) 06/28/2017 0723   HDL 39 (L) 06/28/2017 0723   CHOLHDL 6.7 06/28/2017 0723   VLDL 32 06/28/2017 0723   LDLCALC 191 (H) 06/28/2017 0723    Additional studies/ records that were reviewed today include:   Echocardiogram: 06/2017 Study Conclusions  - Left ventricle: The cavity size was normal. Wall thickness was   increased in a pattern of moderate LVH. Systolic function was   normal. The estimated ejection fraction was in the range of 55%   to 60%. Wall motion was normal; there were no regional wall   motion abnormalities. Doppler parameters are consistent with   abnormal left ventricular relaxation (grade 1 diastolic  dysfunction).  Impressions:  - Normal LV systolic function; mild diastolic dysfunction;   moderate LVH.  Cardiac Catheterization: 10/17/2017  Prox RCA lesion is 15% stenosed.  Mid RCA lesion is 30% stenosed.  Mid LM lesion is 25% stenosed.  Mid LAD lesion is 85% stenosed.  A drug-eluting stent was successfully placed using a STENT SYNERGY DES 2.75X16.  Post intervention, there is a 0% residual stenosis.  Ost 2nd Mrg lesion is 50% stenosed.  Mid Cx lesion is 45% stenosed.  3rd Mrg lesion is 85% stenosed.  A drug-eluting stent was successfully placed using a STENT SYNERGY DES 3X24.  Post intervention, there is a 0% residual stenosis.  LV end diastolic pressure is normal.   1. Severe 2 vessel obstructive CAD- patient has diffuse CAD    - 85% mid LAD    - 85% large OM2 2. Patent stents in RCA.  3. Normal LVEDP 4. Successful PCI of the mid LAD with DES 5. Successful PCI of the OM2  Plan: aggressive risk factor modification. Need to stress compliance with medical therapy. DAPT for one year. Since he has no documented atrial arrhythmia since his ablation I would not resume DOAC. Anticipate DC in am  Recommend uninterrupted dual antiplatelet therapy with Aspirin 81mg  daily and Ticagrelor 90mg  twice daily for a minimum of 12 months (ACS - Class I recommendation).  Assessment:    1. Coronary artery disease involving native coronary artery of native heart with angina pectoris ([place])   2. History of atrial flutter   3. Essential hypertension   4. Hyperlipidemia LDL goal <70   5. Type 2 diabetes mellitus with complication, without long-term current use of insulin ([place])      Plan:   In order of problems listed above:  1. CAD - s/p DES to RCA in 2007. Recently admitted for unstable angina and cardiac catheterization showed 85% mid LAD stenosis, 25% LM stenosis, 30% mid RCA stenosis, 50% 2nd Mrg, 85% 2nd Mrg, and 45% LCx stenosis. He underwent PCI/DES placement to the  mid-LAD and OM2.  - He denies any recurrent chest discomfort but has been experiencing episodic dyspnea following his doses of Brilinta which can last up to an hour at a time. No association with exertion or positional changes and no improvement with the use of caffeine. Plavix is listed as an allergy in his chart but he reports having tolerated this well in 2007. Will stop Brilinta after tonight's dose and load with Plavix 300 mg daily starting tomorrow then 75 mg daily thereafter. Continue beta-blocker and Imdur.  Intolerant to statins as outlined below.    2. History of Atrial Flutter - s/p ablation in 2008 with no known recurrence since. He denies any recent palpitations. No longer on anticoagulation given the need for DAPT and no recent recurrence..  3. HTN - BP is slightly elevated at 142/74 during today's visit. He reports this is well-controlled at home.  I have asked him to keep a detailed blood pressure log. He remains on Irbesartan 75 mg daily, Imdur 30 mg daily, and Lopressor 100 mg twice daily. If BP remains above goal, would further titrate Irbesartan.  4. HLD - FLP in 06/2017 showed total cholesterol 262, triglycerides 159, HDL 39, and LDL 191. He refuses to be on statin therapy due to history of myalgias and is not interested in an alternative such as Zetia. We did discuss referral to the [facility] Clinic and PCSK9 inhibitors but he is not interested at this time. He has  made significant dietary changes since his TIA in 06/2017.  Reviewed with the patient and his wife that he should have a repeat FLP which can be obtained by [place] or his PCP (not fasting today). If LDL remains above goal as I would suspect due to his significant elevation in 06/2017, would again review referral to the [facility]. The patient's wife who is a former Therapist, sports seems more in agreement with this than the patient does at this time.   5. Type 2 DM - Hemoglobin A1c elevated to 9.6 during recent admission. Followed by PCP.     Medication Adjustments/Labs and Tests Ordered: Current medicines are reviewed at length with the patient today.  Concerns regarding medicines are outlined above.  Medication changes, Labs and Tests ordered today are listed in the Patient Instructions below. Patient Instructions  Medication Instructions:  Your physician has recommended you make the following change in your medication:  Stop taking Brilinta after tonight  Start Taking Plavix 300 mg on day one and then 75 mg Daily.    Labwork: NONE   Testing/Procedures: NONE   Follow-Up: Your physician recommends that you schedule a follow-up appointment in: 3 Months with [name].   Any Other Special Instructions Will Be Listed Below (If Applicable).  If you need a refill on your cardiac medications before your next appointment, please call your pharmacy. Thank you for choosing [facility]!     Signed, Erma Heritage, PA-C  10/25/2017 5:39 PM    [facility] [address] [place], [place] [address] Phone: [phone]

## 2017-10-25 ENCOUNTER — Encounter: Payer: Self-pay | Admitting: Student

## 2017-10-25 ENCOUNTER — Ambulatory Visit (INDEPENDENT_AMBULATORY_CARE_PROVIDER_SITE_OTHER): Payer: Medicare Other | Admitting: Student

## 2017-10-25 VITALS — BP 142/74 | HR 68 | Ht 72.0 in | Wt 189.0 lb

## 2017-10-25 DIAGNOSIS — E785 Hyperlipidemia, unspecified: Secondary | ICD-10-CM | POA: Diagnosis not present

## 2017-10-25 DIAGNOSIS — I25119 Atherosclerotic heart disease of native coronary artery with unspecified angina pectoris: Secondary | ICD-10-CM

## 2017-10-25 DIAGNOSIS — I10 Essential (primary) hypertension: Secondary | ICD-10-CM

## 2017-10-25 DIAGNOSIS — E118 Type 2 diabetes mellitus with unspecified complications: Secondary | ICD-10-CM

## 2017-10-25 DIAGNOSIS — Z8679 Personal history of other diseases of the circulatory system: Secondary | ICD-10-CM

## 2017-10-25 MED ORDER — ASPIRIN 81 MG PO CHEW: 81.0000 mg | CHEWABLE_TABLET | Freq: Every day | ORAL | 6 refills | Status: DC

## 2017-10-25 MED ORDER — CLOPIDOGREL BISULFATE 75 MG PO TABS: 75.0000 mg | ORAL_TABLET | Freq: Every day | ORAL | 3 refills | Status: DC

## 2017-10-25 NOTE — Patient Instructions (Signed)
Medication Instructions:  Your physician has recommended you make the following change in your medication:  Stop taking Brilinta after tonight  Start Taking Plavix 300 mg on day one and then 75 mg Daily.    Labwork: NONE   Testing/Procedures: NONE   Follow-Up: Your physician recommends that you schedule a follow-up appointment in: 3 Months with [name].    Any Other Special Instructions Will Be Listed Below (If Applicable).     If you need a refill on your cardiac medications before your next appointment, please call your pharmacy. Thank you for choosing [facility]!

## 2018-01-30 ENCOUNTER — Encounter: Payer: Self-pay | Admitting: Cardiology

## 2018-01-30 ENCOUNTER — Ambulatory Visit: Payer: Medicare Other | Admitting: Cardiology

## 2018-01-30 VITALS — BP 140/70 | HR 71 | Ht 72.0 in | Wt 201.2 lb

## 2018-01-30 DIAGNOSIS — Z8679 Personal history of other diseases of the circulatory system: Secondary | ICD-10-CM | POA: Diagnosis not present

## 2018-01-30 DIAGNOSIS — I25119 Atherosclerotic heart disease of native coronary artery with unspecified angina pectoris: Secondary | ICD-10-CM

## 2018-01-30 DIAGNOSIS — E782 Mixed hyperlipidemia: Secondary | ICD-10-CM | POA: Diagnosis not present

## 2018-01-30 DIAGNOSIS — Z720 Tobacco use: Secondary | ICD-10-CM | POA: Diagnosis not present

## 2018-01-30 DIAGNOSIS — E118 Type 2 diabetes mellitus with unspecified complications: Secondary | ICD-10-CM

## 2018-01-30 MED ORDER — EVOLOCUMAB 140 MG/ML ~~LOC~~ SOAJ: 140.0000 mg | SUBCUTANEOUS | 1 refills | Status: DC

## 2018-01-30 NOTE — Progress Notes (Signed)
Cardiology Office Note  Date: 01/30/2018   ID: Richard [name], DOB [birth year], MRN [id number]  PCP: Dione Housekeeper, MD  Primary Cardiologist: Rozann Lesches, MD   Chief Complaint  Patient presents with  . Coronary Artery Disease    History of Present Illness: Richard [name] is a 72 y.o. male last seen in September by Ms. Strader PA-C.  He is status post DES intervention to the mid LAD and OM 2 in August of this year.  He has been switched from Brilinta to Plavix due to complaints of intermittent dyspnea.  He presents today for follow-up, states that the dyspnea has improved.  He is not report any angina symptoms or nitroglycerin use.  He has a history of atrial flutter status post ablation in 2008, is no longer anticoagulated with no obvious recurrences.  He does not report any palpitations.  He has a history of significant hyperlipidemia with statin intolerance.  He has also not been amenable to considering other treatment options such as Zetia.  I did talk with him about the possibility of PCSK9 inhibitors.  Last LDL was 179 per Dr. Edrick Oh in November.  We will look into this.  He did start smoking after he was discharged from the hospital back in August, but has since quit.  I reviewed his medications, he does have bruising on his arms sometimes with aspirin and Plavix, no other major bleeding episodes or changes in stools.  I discussed with him the rationale for uninterrupted therapy over the next 6 months anticipating a 1 year treatment course.  Past Medical History:  Diagnosis Date  . CAD (coronary artery disease)    a. 2007 - DES x 2 proximal to mid RCA b. 09/2017: DES to mid LAD and OM2. Patent stents along RCA.   . Essential hypertension   . History of atrial flutter    Status post ablation 2008 - Dr. Caryl Comes  . History of transient ischemic attack (TIA)   . Mixed hyperlipidemia    Statin intolerance  . Pneumonia   . Type 2 diabetes mellitus (HCC)    A1C 11.5 06/2017      Past Surgical History:  Procedure Laterality Date  . BALLOON ANGIOPLASTY, ARTERY    . CARDIAC ELECTROPHYSIOLOGY MAPPING AND ABLATION    . CARPAL TUNNEL RELEASE Bilateral   . cervical neck fusion     x 4  . CORONARY STENT INTERVENTION N/A 10/17/2017   Procedure: CORONARY STENT INTERVENTION;  Surgeon: Martinique, Peter M, MD;  Location: [place] [place] [facility];  Service: Cardiovascular;  Laterality: N/A;  . CORONARY STENT PLACEMENT     x 2  . LEFT HEART CATH AND CORONARY ANGIOGRAPHY N/A 10/17/2017   Procedure: LEFT HEART CATH AND CORONARY ANGIOGRAPHY;  Surgeon: Martinique, Peter M, MD;  Location: [place] [facility];  Service: Cardiovascular;  Laterality: N/A;  . ROTATOR CUFF REPAIR Right    x 4  . TIBIA FRACTURE SURGERY Left     Current Outpatient Medications  Medication Sig Dispense Refill  . aspirin 81 MG chewable tablet Chew 1 tablet (81 mg total) by mouth daily. 30 tablet 6  . clopidogrel (PLAVIX) 75 MG tablet Take 1 tablet (75 mg total) by mouth daily. Take 4 tablets (300mg ) on 9/4 then 1 tablet (75mg ) once daily. 90 tablet 3  . glipiZIDE (GLUCOTROL) 10 MG tablet Take 10 mg by mouth daily.     . hydrALAZINE (APRESOLINE) 25 MG tablet Take 1 tablet (25 mg total) by mouth  every 6 (six) hours as needed (BP > 160/100). 120 tablet 6  . irbesartan (AVAPRO) 75 MG tablet Take 1 tablet (75 mg total) by mouth daily. 30 tablet 6  . isosorbide mononitrate (IMDUR) 30 MG 24 hr tablet Take 1 tablet (30 mg total) by mouth daily. 30 tablet 6  . metFORMIN (GLUCOPHAGE) 1000 MG tablet Take 1 tablet (1,000 mg total) by mouth 2 (two) times daily.    . metoprolol tartrate (LOPRESSOR) 100 MG tablet Take 1 tablet (100 mg total) by mouth 2 (two) times daily. 60 tablet 6  . nitroGLYCERIN (NITROSTAT) 0.4 MG SL tablet Place 1 tablet (0.4 mg total) under the tongue every 5 (five) minutes as needed for chest pain. 25 tablet 2  . pioglitazone (ACTOS) 15 MG tablet Take 15 mg by mouth daily.     . Evolocumab (REPATHA  SURECLICK) 254 MG/ML SOAJ Inject 140 mg into the skin every 14 (fourteen) days. 2 pen 1   No current facility-administered medications for this visit.    Allergies:  Statins   Social History: The patient  reports that he quit smoking about 6 months ago. His smoking use included cigarettes. He has a 54.00 pack-year smoking history. He has never used smokeless tobacco. He reports that he does not drink alcohol or use drugs.   ROS:  Please see the history of present illness. Otherwise, complete review of systems is positive for none.  All other systems are reviewed and negative.   Physical Exam: VS:  BP 140/70   Pulse 71   Ht 6' (1.829 m)   Wt 201 lb 3.2 oz (91.3 kg)   SpO2 97%   BMI 27.29 kg/m , BMI Body mass index is 27.29 kg/m.  Wt Readings from Last 3 Encounters:  01/30/18 201 lb 3.2 oz (91.3 kg)  10/25/17 189 lb (85.7 kg)  10/17/17 192 lb 14.4 oz (87.5 kg)    General: Patient appears comfortable at rest. HEENT: Conjunctiva and lids normal, oropharynx clear. Neck: Supple, no elevated JVP or carotid bruits, no thyromegaly. Lungs: Clear to auscultation, nonlabored breathing at rest. Cardiac: Regular rate and rhythm, no S3, soft systolic murmur. Abdomen: Soft, nontender, bowel sounds present. Extremities: Few ecchymoses on the forearms, no pitting edema, distal pulses 2+. Skin: Warm and dry. Musculoskeletal: No kyphosis. Neuropsychiatric: Alert and oriented x3, affect grossly appropriate.  ECG: I personally reviewed the tracing from 10/25/2017 which showed normal sinus rhythm.  Recent Labwork: 06/27/2017: ALT 20; AST 21; TSH 3.672 10/18/2017: BUN 16; Creatinine, Ser 1.20; Potassium 4.3; Sodium 138 10/19/2017: Hemoglobin 12.1; Platelets 165     Component Value Date/Time   CHOL 262 (H) 06/28/2017 0723   TRIG 159 (H) 06/28/2017 0723   HDL 39 (L) 06/28/2017 0723   CHOLHDL 6.7 06/28/2017 0723   VLDL 32 06/28/2017 0723   LDLCALC 191 (H) 06/28/2017 0723    Other Studies Reviewed  Today:  Cardiac catheterization and PCI 10/17/2017:  Prox RCA lesion is 15% stenosed.  Mid RCA lesion is 30% stenosed.  Mid LM lesion is 25% stenosed.  Mid LAD lesion is 85% stenosed.  A drug-eluting stent was successfully placed using a STENT SYNERGY DES 2.75X16.  Post intervention, there is a 0% residual stenosis.  Ost 2nd Mrg lesion is 50% stenosed.  Mid Cx lesion is 45% stenosed.  3rd Mrg lesion is 85% stenosed.  A drug-eluting stent was successfully placed using a STENT SYNERGY DES 3X24.  Post intervention, there is a 0% residual stenosis.  LV end diastolic  pressure is normal.   1. Severe 2 vessel obstructive CAD- patient has diffuse CAD    - 85% mid LAD    - 85% large OM2 2. Patent stents in RCA.  3. Normal LVEDP 4. Successful PCI of the mid LAD with DES 5. Successful PCI of the OM2  Plan: aggressive risk factor modification. Need to stress compliance with medical therapy. DAPT for one year. Since he has no documented atrial arrhythmia since his ablation I would not resume DOAC. Anticipate DC in am  Recommend uninterrupted dual antiplatelet therapy with Aspirin 81mg  daily and Ticagrelor 90mg  twice daily for a minimum of 12 months (ACS - Class I recommendation).  Echocardiogram 06/28/2017: Study Conclusions  - Left ventricle: The cavity size was normal. Wall thickness was   increased in a pattern of moderate LVH. Systolic function was   normal. The estimated ejection fraction was in the range of 55%   to 60%. Wall motion was normal; there were no regional wall   motion abnormalities. Doppler parameters are consistent with   abnormal left ventricular relaxation (grade 1 diastolic   dysfunction).  Impressions:  - Normal LV systolic function; mild diastolic dysfunction;   moderate LVH.  Assessment and Plan:  1.  CAD with history of DES x2 to the RCA in 2007, and more recently DES to the mid LAD and OM 2 in August of this year.  I reinforced compliance  with dual antiplatelet therapy, he is tolerating aspirin and Plavix with some bruising on his forearms but no other major bleeding events.  2.  Mixed hyperlipidemia, recent LDL 179.  He has had significant statin intolerance over time.  We will look into possibility of coverage for Repatha.  3.  Intermittent tobacco abuse, has quit since resuming after hospital discharge in August.  4.  History of atrial flutter ablation, quiescent.  No longer anticoagulated.  5.  Type 2 diabetes mellitus, follows with Dr. Edrick Oh.  Recent LDL 9.8.  Current medicines were reviewed with the patient today.  Disposition: Follow-up in 3 to 4 months.  Signed, Satira Sark, MD, [facility] 01/30/2018 11:34 AM    [facility] [place]. [address] [place], [place] [address] Phone: [phone]; Fax: [phone]

## 2018-01-30 NOTE — Patient Instructions (Addendum)
Medication Instructions:   Your physician recommends that you continue on your current medications as directed. Please refer to the Current Medication list given to you today.  We are sending a prescription to your pharmacy for repatha to check on the price of it.   Labwork:  NONE  Testing/Procedures:  NONE  Follow-Up:  Your physician recommends that you schedule a follow-up appointment in: 3-4 months with Domenic Polite or [employer].  Any Other Special Instructions Will Be Listed Below (If Applicable).  If you need a refill on your cardiac medications before your next appointment, please call your pharmacy.

## 2018-02-07 ENCOUNTER — Ambulatory Visit: Payer: Medicare Other | Admitting: Adult Health

## 2018-05-03 NOTE — Progress Notes (Signed)
Cardiology Office Note  Date: 05/04/2018   ID: Richard [name], DOB 06-Jan-1946, MRN [id number]  PCP: Dione Housekeeper, MD  Primary Cardiologist: Rozann Lesches, MD   Chief Complaint  Patient presents with  . Coronary Artery Disease    History of Present Illness: Richard [name] is a 73 y.o. male last seen in December 2019.  He presents for a routine follow-up visit.  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  He does not report any specific angina symptoms or nitroglycerin use.  I reviewed his medications.  He has a statin intolerance and also declines use of Zetia.  I talked with him about considering PCSK9 inhibitor although he is very worried about his actual cost per month.  We will check with the lipid clinic.  I also reviewed his medications.  We discussed moving his Imdur to an evening dose.  Past Medical History:  Diagnosis Date  . CAD (coronary artery disease)    a. 2007 - DES x 2 proximal to mid RCA b. 09/2017: DES to mid LAD and OM2. Patent stents along RCA.   . Essential hypertension   . History of atrial flutter    Status post ablation 2008 - Dr. Caryl Comes  . History of transient ischemic attack (TIA)   . Mixed hyperlipidemia    Statin intolerance  . Pneumonia   . Type 2 diabetes mellitus (HCC)    A1C 11.5 06/2017    Past Surgical History:  Procedure Laterality Date  . BALLOON ANGIOPLASTY, ARTERY    . CARDIAC ELECTROPHYSIOLOGY MAPPING AND ABLATION    . CARPAL TUNNEL RELEASE Bilateral   . cervical neck fusion     x 4  . CORONARY STENT INTERVENTION N/A 10/17/2017   Procedure: CORONARY STENT INTERVENTION;  Surgeon: Martinique, Peter M, MD;  Location: [facility];  Service: Cardiovascular;  Laterality: N/A;  . CORONARY STENT PLACEMENT     x 2  . LEFT HEART CATH AND CORONARY ANGIOGRAPHY N/A 10/17/2017   Procedure: LEFT HEART CATH AND CORONARY ANGIOGRAPHY;  Surgeon: Martinique, Peter M, MD;  Location: [facility];  Service:  Cardiovascular;  Laterality: N/A;  . ROTATOR CUFF REPAIR Right    x 4  . TIBIA FRACTURE SURGERY Left     Current Outpatient Medications  Medication Sig Dispense Refill  . aspirin 81 MG chewable tablet Chew 1 tablet (81 mg total) by mouth daily. 30 tablet 6  . clopidogrel (PLAVIX) 75 MG tablet Take 1 tablet (75 mg total) by mouth daily. Take 4 tablets (300mg ) on 9/4 then 1 tablet (75mg ) once daily. 90 tablet 3  . glipiZIDE (GLUCOTROL) 10 MG tablet Take 10 mg by mouth daily.     . hydrALAZINE (APRESOLINE) 25 MG tablet Take 1 tablet (25 mg total) by mouth every 6 (six) hours as needed (BP > 160/100). 120 tablet 6  . irbesartan (AVAPRO) 300 MG tablet Take 300 mg by mouth daily.    . isosorbide mononitrate (IMDUR) 30 MG 24 hr tablet Take 1 tablet (30 mg total) by mouth at bedtime. 30 tablet 6  . metFORMIN (GLUCOPHAGE) 1000 MG tablet Take 1 tablet (1,000 mg total) by mouth 2 (two) times daily.    . metoprolol tartrate (LOPRESSOR) 100 MG tablet Take 1 tablet (100 mg total) by mouth 2 (two) times daily. 60 tablet 6  . nitroGLYCERIN (NITROSTAT) 0.4 MG SL tablet Place 1 tablet (0.4 mg total) under the tongue every 5 (five)  minutes as needed for chest pain. 25 tablet 2  . pioglitazone (ACTOS) 15 MG tablet Take 15 mg by mouth daily.      No current facility-administered medications for this visit.    Allergies:  Statins   Social History: The patient  reports that he has been smoking cigarettes. He has a 54.00 pack-year smoking history. He has never used smokeless tobacco. He reports that he does not drink alcohol or use drugs.   ROS:  Please see the history of present illness. Otherwise, complete review of systems is positive for none.  All other systems are reviewed and negative.   Physical Exam: VS:  BP (!) 150/80   Pulse 90   Ht 6' (1.829 m)   SpO2 96%   BMI 27.29 kg/m , BMI Body mass index is 27.29 kg/m.  Wt Readings from Last 3 Encounters:  01/30/18 201 lb 3.2 oz (91.3 kg)  10/25/17  189 lb (85.7 kg)  10/17/17 192 lb 14.4 oz (87.5 kg)    General: Patient appears comfortable at rest. HEENT: Conjunctiva and lids normal, oropharynx clear. Neck: Supple, no elevated JVP or carotid bruits, no thyromegaly. Lungs: Clear to auscultation, nonlabored breathing at rest. Cardiac: Regular rate and rhythm, no S3, soft systolic murmur. Abdomen: Soft, nontender, bowel sounds present. Extremities: No pitting edema, distal pulses 2+. Skin: Warm and dry. Musculoskeletal: No kyphosis. Neuropsychiatric: Alert and oriented x3, affect grossly appropriate.  ECG: I personally reviewed the tracing from 10/25/2017 which showed normal sinus rhythm.  Recent Labwork: 06/27/2017: ALT 20; AST 21; TSH 3.672 10/18/2017: BUN 16; Creatinine, Ser 1.20; Potassium 4.3; Sodium 138 10/19/2017: Hemoglobin 12.1; Platelets 165     Component Value Date/Time   CHOL 262 (H) 06/28/2017 0723   TRIG 159 (H) 06/28/2017 0723   HDL 39 (L) 06/28/2017 0723   CHOLHDL 6.7 06/28/2017 0723   VLDL 32 06/28/2017 0723   LDLCALC 191 (H) 06/28/2017 0723  November 2019: BUN 21, creatinine 1.09, potassium 5.0, AST 18, ALT 19, hemoglobin 13.2, platelets 197, hemoglobin A1c 9.8%, cholesterol 258, triglycerides 182, HDL 43, LDL 179  Other Studies Reviewed Today:  Cardiac catheterization and PCI 10/17/2017:  Prox RCA lesion is 15% stenosed.  Mid RCA lesion is 30% stenosed.  Mid LM lesion is 25% stenosed.  Mid LAD lesion is 85% stenosed.  A drug-eluting stent was successfully placed using a STENT SYNERGY DES 2.75X16.  Post intervention, there is a 0% residual stenosis.  Ost 2nd Mrg lesion is 50% stenosed.  Mid Cx lesion is 45% stenosed.  3rd Mrg lesion is 85% stenosed.  A drug-eluting stent was successfully placed using a STENT SYNERGY DES 3X24.  Post intervention, there is a 0% residual stenosis.  LV end diastolic pressure is normal.  1. Severe 2 vessel obstructive CAD- patient has diffuse CAD - 85% mid LAD  - 85% large OM2 2. Patent stents in RCA.  3. Normal LVEDP 4. Successful PCI of the mid LAD with DES 5. Successful PCI of the OM2  Plan: aggressive risk factor modification. Need to stress compliance with medical therapy. DAPT for one year. Since he has no documented atrial arrhythmia since his ablation I would not resume DOAC. Anticipate DC in am  Recommend uninterrupted dual antiplatelet therapy with Aspirin 81mg  daily and Ticagrelor 90mg  twice dailyfor a minimum of 12 months (ACS - Class I recommendation).  Echocardiogram 06/28/2017: Study Conclusions  - Left ventricle: The cavity size was normal. Wall thickness was increased in a pattern of moderate LVH. Systolic  function was normal. The estimated ejection fraction was in the range of 55% to 60%. Wall motion was normal; there were no regional wall motion abnormalities. Doppler parameters are consistent with abnormal left ventricular relaxation (grade 1 diastolic dysfunction).  Impressions:  - Normal LV systolic function; mild diastolic dysfunction; moderate LVH.  Assessment and Plan:  1.  CAD status post DES x2 to the RCA in 2007 and DES to the mid LAD and OM 2 in August 2019.  He continues on dual antiplatelet therapy and reports no active angina symptoms.  Continue present regimen although move Imdur to an evening dose.  2.  Mixed hyperlipidemia with statin intolerance, also declines Zetia.  We will investigate his cost of Repatha versus Praluent, he has significant concerns about this.  Otherwise would be willing to consider it.  3.  History of atrial flutter ablation.  No obvious recurrent arrhythmias.  4.  Intermittent tobacco abuse.  We have discussed complete cessation.  Current medicines were reviewed with the patient today.  Disposition: Follow-up in 6 months.  Signed, Satira Sark, MD, [facility] 05/04/2018 12:13 PM    [place] at [facility] [address],  [place], [place] [address] Phone: [phone]; Fax: [phone]

## 2018-05-04 ENCOUNTER — Other Ambulatory Visit: Payer: Self-pay

## 2018-05-04 ENCOUNTER — Encounter: Payer: Self-pay | Admitting: Cardiology

## 2018-05-04 ENCOUNTER — Ambulatory Visit (INDEPENDENT_AMBULATORY_CARE_PROVIDER_SITE_OTHER): Payer: Medicare Other | Admitting: Cardiology

## 2018-05-04 VITALS — BP 150/80 | HR 90 | Ht 72.0 in

## 2018-05-04 DIAGNOSIS — Z8679 Personal history of other diseases of the circulatory system: Secondary | ICD-10-CM | POA: Diagnosis not present

## 2018-05-04 DIAGNOSIS — E782 Mixed hyperlipidemia: Secondary | ICD-10-CM

## 2018-05-04 DIAGNOSIS — Z720 Tobacco use: Secondary | ICD-10-CM

## 2018-05-04 DIAGNOSIS — I25119 Atherosclerotic heart disease of native coronary artery with unspecified angina pectoris: Secondary | ICD-10-CM | POA: Diagnosis not present

## 2018-05-04 MED ORDER — ISOSORBIDE MONONITRATE ER 30 MG PO TB24: 30.0000 mg | ORAL_TABLET | Freq: Every day | ORAL | 6 refills | Status: DC

## 2018-05-04 NOTE — Patient Instructions (Addendum)
Medication Instructions:  Your physician recommends that you continue on your current medications as directed. Please refer to the Current Medication list given to you today.  Take your Imdur at night   If you need a refill on your cardiac medications before your next appointment, please call your pharmacy.   Lab work: NONE   If you have labs (blood work) drawn today and your tests are completely normal, you will receive your results only by: [name] MyChart Message (if you have MyChart) OR . A paper copy in the mail If you have any lab test that is abnormal or we need to change your treatment, we will call you to review the results.  Testing/Procedures: NONE   Follow-Up: At [facility], you and your health needs are our priority.  As part of our continuing mission to provide you with exceptional heart care, we have created designated Provider Care Teams.  These Care Teams include your primary Cardiologist (physician) and Advanced Practice Providers (APPs -  Physician Assistants and Nurse Practitioners) who all work together to provide you with the care you need, when you need it. You will need a follow up appointment in 6 months.  Please call our office 2 months in advance to schedule this appointment.  You may see Rozann Lesches, MD or one of the following Advanced Practice Providers on your designated Care Team:   Bernerd Pho, PA-C [facility]) . Ermalinda Barrios, PA-C ([place])  Any Other Special Instructions Will Be Listed Below (If Applicable). Thank you for choosing [facility]!

## 2018-06-12 DIAGNOSIS — F17210 Nicotine dependence, cigarettes, uncomplicated: Secondary | ICD-10-CM | POA: Insufficient documentation

## 2018-10-19 ENCOUNTER — Other Ambulatory Visit: Payer: Self-pay | Admitting: Student

## 2019-01-05 ENCOUNTER — Encounter: Payer: Self-pay | Admitting: Cardiology

## 2019-01-05 ENCOUNTER — Ambulatory Visit: Payer: Medicare Other | Admitting: Cardiology

## 2019-01-05 ENCOUNTER — Other Ambulatory Visit: Payer: Self-pay

## 2019-01-05 VITALS — BP 156/90 | HR 70 | Temp 97.8°F | Ht 72.0 in | Wt 189.0 lb

## 2019-01-05 DIAGNOSIS — I25110 Atherosclerotic heart disease of native coronary artery with unstable angina pectoris: Secondary | ICD-10-CM | POA: Diagnosis not present

## 2019-01-05 DIAGNOSIS — I10 Essential (primary) hypertension: Secondary | ICD-10-CM | POA: Diagnosis not present

## 2019-01-05 DIAGNOSIS — E1169 Type 2 diabetes mellitus with other specified complication: Secondary | ICD-10-CM | POA: Diagnosis not present

## 2019-01-05 DIAGNOSIS — E785 Hyperlipidemia, unspecified: Secondary | ICD-10-CM

## 2019-01-05 NOTE — Progress Notes (Signed)
Chart reviewed with [name].

## 2019-01-05 NOTE — Progress Notes (Signed)
Cardiology Office Note  Date: 01/05/2019   ID: [name], DOB Jun 05, 1945, MRN [id number]  PCP:  Richard Leash, PA-C  Cardiologist:  Rozann Lesches, MD Electrophysiologist:  None   Chief Complaint  Patient presents with  . Cardiac follow-up    History of Present Illness: Richard [name] is a 73 y.o. male last seen in March.  He presents for a routine visit.  Since last assessment he has had no obvious angina symptoms or nitroglycerin use.  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  Prior PCP Dr. Edrick Oh has retired as well, he is seeing a new provider in [facility].  He has history of statin intolerance as discussed previously, declined use of Zetia.  I have talked with him about PCSK9 inhibitor and referral to the lipid clinic.  He states that he will follow through on this, although not certain that he wants to take anything else.  Lipids are not well controlled however and I talked with him about further risk reduction with ischemic heart disease.  We went over his medications which are outlined below.  He reports easy bruising on dual antiplatelet therapy and is over 1 year out from his last DES intervention.  We will plan to stop Plavix.  Past Medical History:  Diagnosis Date  . CAD (coronary artery disease)    a. 2007 - DES x 2 proximal to mid RCA b. 09/2017: DES to mid LAD and OM2. Patent stents along RCA.   . Essential hypertension   . History of atrial flutter    Status post ablation 2008 - Dr. Caryl Comes  . History of transient ischemic attack (TIA)   . Mixed hyperlipidemia    Statin intolerance  . Pneumonia   . Type 2 diabetes mellitus (HCC)    A1C 11.5 06/2017    Past Surgical History:  Procedure Laterality Date  . BALLOON ANGIOPLASTY, ARTERY    . CARDIAC ELECTROPHYSIOLOGY MAPPING AND ABLATION    . CARPAL TUNNEL RELEASE Bilateral   . cervical neck fusion     x 4  . CORONARY STENT INTERVENTION N/A 10/17/2017   Procedure:  CORONARY STENT INTERVENTION;  Surgeon: Martinique, Peter M, MD;  Location: [facility];  Service: Cardiovascular;  Laterality: N/A;  . CORONARY STENT PLACEMENT     x 2  . LEFT HEART CATH AND CORONARY ANGIOGRAPHY N/A 10/17/2017   Procedure: LEFT HEART CATH AND CORONARY ANGIOGRAPHY;  Surgeon: Martinique, Peter M, MD;  Location: [facility];  Service: Cardiovascular;  Laterality: N/A;  . ROTATOR CUFF REPAIR Right    x 4  . TIBIA FRACTURE SURGERY Left     Current Outpatient Medications  Medication Sig Dispense Refill  . aspirin 81 MG chewable tablet Chew 1 tablet (81 mg total) by mouth daily. 30 tablet 6  . clopidogrel (PLAVIX) 75 MG tablet TAKE 4 TABLETS ON 10/26/17, THEN TAKE 1 TABLET DAILY 90 tablet 3  . glipiZIDE (GLUCOTROL) 10 MG tablet Take 10 mg by mouth daily.     . hydrALAZINE (APRESOLINE) 25 MG tablet Take 1 tablet (25 mg total) by mouth every 6 (six) hours as needed (BP > 160/100). 120 tablet 6  . irbesartan (AVAPRO) 300 MG tablet Take 300 mg by mouth daily.    . isosorbide mononitrate (IMDUR) 30 MG 24 hr tablet Take 1 tablet (30 mg total) by mouth at bedtime. 30 tablet 6  . metFORMIN (GLUCOPHAGE) 1000 MG tablet Take 1  tablet (1,000 mg total) by mouth 2 (two) times daily.    . metoprolol tartrate (LOPRESSOR) 100 MG tablet Take 1 tablet (100 mg total) by mouth 2 (two) times daily. 60 tablet 6  . nitroGLYCERIN (NITROSTAT) 0.4 MG SL tablet Place 1 tablet (0.4 mg total) under the tongue every 5 (five) minutes as needed for chest pain. 25 tablet 2  . pioglitazone (ACTOS) 15 MG tablet Take 15 mg by mouth daily.      No current facility-administered medications for this visit.    Allergies:  Statins   Social History: The patient  reports that he has been smoking cigarettes. He has a 54.00 pack-year smoking history. He has never used smokeless tobacco. He reports that he does not drink alcohol or use drugs.   ROS:  Please see the history of present illness. Otherwise, complete review  of systems is positive for none.  All other systems are reviewed and negative.   Physical Exam: VS:  BP (!) 156/90   Pulse 70   Temp 97.8 F (36.6 C)   Ht 6' (1.829 m)   Wt 189 lb (85.7 kg)   SpO2 95%   BMI 25.63 kg/m , BMI Body mass index is 25.63 kg/m.  Wt Readings from Last 3 Encounters:  01/05/19 189 lb (85.7 kg)  01/30/18 201 lb 3.2 oz (91.3 kg)  10/25/17 189 lb (85.7 kg)    General: Patient appears comfortable at rest. HEENT: Conjunctiva and lids normal, wearing a mask. Neck: Supple, no elevated JVP or carotid bruits, no thyromegaly. Lungs: Clear to auscultation, nonlabored breathing at rest. Cardiac: Regular rate and rhythm, no S3, soft systolic murmur. Abdomen: Soft, nontender, bowel sounds present. Extremities: No pitting edema, distal pulses 2+. Skin: Warm and dry. Musculoskeletal: No kyphosis. Neuropsychiatric: Alert and oriented x3, affect grossly appropriate.  ECG:  An ECG dated 10/25/2017 was personally reviewed today and demonstrated:  Normal sinus rhythm.  Recent Labwork:  November 2019: BUN 21, creatinine 1.09, potassium 5.0, AST 18, ALT 19, hemoglobin 13.2, platelets 197, hemoglobin A1c 9.8%, cholesterol 258, triglycerides 182, HDL 43, LDL 179  Other Studies Reviewed Today:  Cardiac catheterization and PCI 10/17/2017:  Prox RCA lesion is 15% stenosed.  Mid RCA lesion is 30% stenosed.  Mid LM lesion is 25% stenosed.  Mid LAD lesion is 85% stenosed.  A drug-eluting stent was successfully placed using a STENT SYNERGY DES 2.75X16.  Post intervention, there is a 0% residual stenosis.  Ost 2nd Mrg lesion is 50% stenosed.  Mid Cx lesion is 45% stenosed.  3rd Mrg lesion is 85% stenosed.  A drug-eluting stent was successfully placed using a STENT SYNERGY DES 3X24.  Post intervention, there is a 0% residual stenosis.  LV end diastolic pressure is normal.  1. Severe 2 vessel obstructive CAD- patient has diffuse CAD - 85% mid LAD - 85% large  OM2 2. Patent stents in RCA.  3. Normal LVEDP 4. Successful PCI of the mid LAD with DES 5. Successful PCI of the OM2  Plan: aggressive risk factor modification. Need to stress compliance with medical therapy. DAPT for one year. Since he has no documented atrial arrhythmia since his ablation I would not resume DOAC. Anticipate DC in am  Recommend uninterrupted dual antiplatelet therapy with Aspirin 81mg  daily and Ticagrelor 90mg  twice dailyfor a minimum of 12 months (ACS - Class I recommendation).  Echocardiogram 06/28/2017: Study Conclusions  - Left ventricle: The cavity size was normal. Wall thickness was increased in a pattern of moderate  LVH. Systolic function was normal. The estimated ejection fraction was in the range of 55% to 60%. Wall motion was normal; there were no regional wall motion abnormalities. Doppler parameters are consistent with abnormal left ventricular relaxation (grade 1 diastolic dysfunction).  Impressions:  - Normal LV systolic function; mild diastolic dysfunction; moderate LVH.  Assessment and Plan:  1.  Is post DES x2 to the RCA in 2007 and more recently DES to the mid LAD and OM 2 in August 2019.  He reports no active angina on medical therapy.  We will stop Plavix but otherwise continue aspirin, Imdur, and beta-blocker.  2.  Mixed hyperlipidemia with multistatin intolerance.  He also declines trial of Zetia.  In light of his lipid panel he would be a good candidate for PCSK9 inhibitor if covered.  I have talked to him about referral to the lipid clinic for assessment.  3.  History of atrial flutter status post radiofrequency ablation.  He has had no obvious recurrences and is not anticoagulated.   Medication Adjustments/Labs and Tests Ordered: Current medicines are reviewed at length with the patient today.  Concerns regarding medicines are outlined above.   Tests Ordered: Orders Placed This Encounter  Procedures  . AMB Referral  to [facility]    Medication Changes: No orders of the defined types were placed in this encounter.   Disposition:  Follow up 6 months in the [place] office.  Signed, Satira Sark, MD, [facility] 01/05/2019 12:12 PM    [place] at [place] [address] [place], [place] [address] Phone: [phone]; Fax: [phone]

## 2019-01-05 NOTE — Patient Instructions (Signed)
Medication Instructions:  STOP Plavix after you finish your current bottle  *If you need a refill on your cardiac medications before your next appointment, please call your pharmacy*  Lab Work: None today If you have labs (blood work) drawn today and your tests are completely normal, you will receive your results only by: [name] MyChart Message (if you have MyChart) OR . A paper copy in the mail If you have any lab test that is abnormal or we need to change your treatment, we will call you to review the results.  Testing/Procedures: None today  Follow-Up: At [facility], you and your health needs are our priority.  As part of our continuing mission to provide you with exceptional heart care, we have created designated Provider Care Teams.  These Care Teams include your primary Cardiologist (physician) and Advanced Practice Providers (APPs -  Physician Assistants and Nurse Practitioners) who all work together to provide you with the care you need, when you need it.  Your next appointment:   6 months  The format for your next appointment:   In Person  Provider:   Rozann Lesches, MD  Other Instructions Wee referring you to the [facility].They will call you.

## 2019-02-21 ENCOUNTER — Encounter: Payer: Self-pay | Admitting: Physician Assistant

## 2019-05-08 DIAGNOSIS — E785 Hyperlipidemia, unspecified: Secondary | ICD-10-CM | POA: Diagnosis not present

## 2019-05-08 DIAGNOSIS — I10 Essential (primary) hypertension: Secondary | ICD-10-CM | POA: Diagnosis not present

## 2019-05-08 DIAGNOSIS — E119 Type 2 diabetes mellitus without complications: Secondary | ICD-10-CM | POA: Diagnosis not present

## 2019-06-08 DIAGNOSIS — E119 Type 2 diabetes mellitus without complications: Secondary | ICD-10-CM | POA: Diagnosis not present

## 2019-06-08 DIAGNOSIS — I10 Essential (primary) hypertension: Secondary | ICD-10-CM | POA: Diagnosis not present

## 2019-06-08 DIAGNOSIS — E785 Hyperlipidemia, unspecified: Secondary | ICD-10-CM | POA: Diagnosis not present

## 2019-07-08 NOTE — Progress Notes (Signed)
Cardiology Office Note  Date: 07/09/2019   ID: Richard [name], DOB 13-Sep-1945, MRN [id number]  PCP:  Candi Leash, PA-C  Cardiologist:  Rozann Lesches, MD Electrophysiologist:  None   Chief Complaint  Patient presents with  . Cardiac follow-up    History of Present Illness: Richard [name] is a 74 y.o. male last seen in November 2020.  He presents for a routine visit.  Since last evaluation he does not report any definitive angina symptoms or nitroglycerin use.  He has been experiencing more of a generalized discomfort in his epigastric area, has been using Tums frequently.  Sometimes feels lightheaded when he drinks cold water, no palpitations or syncope.  He has a history of multistatin intolerance and also declines Zetia.  We have discussed PCSK9 inhibitor.  He has been placed on low-dose Lipitor by PCP and so far tolerating this in the last few weeks.  I reviewed the remainder of his cardiac medications which are outlined below.  I personally reviewed his ECG today which shows normal sinus rhythm.  He is due for follow-up of carotid artery disease, we discussed this today.  Past Medical History:  Diagnosis Date  . CAD (coronary artery disease)    a. 2007 - DES x 2 proximal to mid RCA b. 09/2017: DES to mid LAD and OM2. Patent stents along RCA.   . Essential hypertension   . History of atrial flutter    Status post ablation 2008 - Dr. Caryl Comes  . History of transient ischemic attack (TIA)   . Mixed hyperlipidemia    Statin intolerance  . Pneumonia   . Type 2 diabetes mellitus (HCC)    A1C 11.5 06/2017    Past Surgical History:  Procedure Laterality Date  . BALLOON ANGIOPLASTY, ARTERY    . CARDIAC ELECTROPHYSIOLOGY MAPPING AND ABLATION    . CARPAL TUNNEL RELEASE Bilateral   . cervical neck fusion     x 4  . CORONARY STENT INTERVENTION N/A 10/17/2017   Procedure: CORONARY STENT INTERVENTION;  Surgeon: Martinique, Peter M, MD;  Location: [facility];  Service:  Cardiovascular;  Laterality: N/A;  . CORONARY STENT PLACEMENT     x 2  . LEFT HEART CATH AND CORONARY ANGIOGRAPHY N/A 10/17/2017   Procedure: LEFT HEART CATH AND CORONARY ANGIOGRAPHY;  Surgeon: Martinique, Peter M, MD;  Location: [facility];  Service: Cardiovascular;  Laterality: N/A;  . ROTATOR CUFF REPAIR Right    x 4  . TIBIA FRACTURE SURGERY Left     Current Outpatient Medications  Medication Sig Dispense Refill  . aspirin 325 MG EC tablet Take 325 mg by mouth daily.    [name] atorvastatin (LIPITOR) 10 MG tablet Take 10 mg by mouth daily.    [name] FARXIGA 10 MG TABS tablet Take 10 mg by mouth daily.    [name] glipiZIDE (GLUCOTROL) 10 MG tablet Take 10 mg by mouth daily.     . hydrALAZINE (APRESOLINE) 25 MG tablet Take 1 tablet (25 mg total) by mouth every 6 (six) hours as needed (BP > 160/100). 120 tablet 6  . irbesartan (AVAPRO) 300 MG tablet Take 300 mg by mouth daily.    . isosorbide mononitrate (IMDUR) 30 MG 24 hr tablet Take 1 tablet (30 mg total) by mouth at bedtime. 30 tablet 6  . metFORMIN (GLUCOPHAGE) 1000 MG tablet Take 1 tablet (1,000 mg total) by mouth 2 (two) times daily.    . metoprolol tartrate (LOPRESSOR) 100 MG tablet Take  1 tablet (100 mg total) by mouth 2 (two) times daily. 60 tablet 6  . pioglitazone (ACTOS) 15 MG tablet Take 15 mg by mouth daily.     . nitroGLYCERIN (NITROSTAT) 0.4 MG SL tablet Place 1 tablet (0.4 mg total) under the tongue every 5 (five) minutes as needed for chest pain. 25 tablet 3   No current facility-administered medications for this visit.   Allergies:  Statins   ROS:   No palpitations or syncope.  Physical Exam: VS:  BP 138/80   Pulse 94   Ht 6' (1.829 m)   Wt 182 lb 3.2 oz (82.6 kg)   SpO2 98%   BMI 24.71 kg/m , BMI Body mass index is 24.71 kg/m.  Wt Readings from Last 3 Encounters:  07/09/19 182 lb 3.2 oz (82.6 kg)  01/05/19 189 lb (85.7 kg)  01/30/18 201 lb 3.2 oz (91.3 kg)    General: Patient appears comfortable at rest. HEENT:  Conjunctiva and lids normal, wearing a mask. Neck: Supple, no elevated JVP, bilateral carotid bruits, no thyromegaly. Lungs: Clear to auscultation, nonlabored breathing at rest. Cardiac: Regular rate and rhythm, no S3, soft systolic murmur, no pericardial rub. Abdomen: Soft, nontender, bowel sounds present, no guarding or rebound. Extremities: No pitting edema, distal pulses 2+.  ECG:  An ECG dated 10/25/2017 was personally reviewed today and demonstrated:  Normal sinus rhythm.  Recent Labwork:    Component Value Date/Time   CHOL 262 (H) 06/28/2017 0723   TRIG 159 (H) 06/28/2017 0723   HDL 39 (L) 06/28/2017 0723   CHOLHDL 6.7 06/28/2017 0723   VLDL 32 06/28/2017 0723   LDLCALC 191 (H) 06/28/2017 0723    Other Studies Reviewed Today:  Cardiac catheterization and PCI 10/17/2017:  Prox RCA lesion is 15% stenosed.  Mid RCA lesion is 30% stenosed.  Mid LM lesion is 25% stenosed.  Mid LAD lesion is 85% stenosed.  A drug-eluting stent was successfully placed using a STENT SYNERGY DES 2.75X16.  Post intervention, there is a 0% residual stenosis.  Ost 2nd Mrg lesion is 50% stenosed.  Mid Cx lesion is 45% stenosed.  3rd Mrg lesion is 85% stenosed.  A drug-eluting stent was successfully placed using a STENT SYNERGY DES 3X24.  Post intervention, there is a 0% residual stenosis.  LV end diastolic pressure is normal.  1. Severe 2 vessel obstructive CAD- patient has diffuse CAD - 85% mid LAD - 85% large OM2 2. Patent stents in RCA.  3. Normal LVEDP 4. Successful PCI of the mid LAD with DES 5. Successful PCI of the OM2  Plan: aggressive risk factor modification. Need to stress compliance with medical therapy. DAPT for one year. Since he has no documented atrial arrhythmia since his ablation I would not resume DOAC. Anticipate DC in am  Recommend uninterrupted dual antiplatelet therapy with Aspirin 81mg  daily and Ticagrelor 90mg  twice dailyfor a minimum of 12 months  (ACS - Class I recommendation).  Echocardiogram 06/28/2017: Study Conclusions  - Left ventricle: The cavity size was normal. Wall thickness was increased in a pattern of moderate LVH. Systolic function was normal. The estimated ejection fraction was in the range of 55% to 60%. Wall motion was normal; there were no regional wall motion abnormalities. Doppler parameters are consistent with abnormal left ventricular relaxation (grade 1 diastolic dysfunction).  Impressions:  - Normal LV systolic function; mild diastolic dysfunction; moderate LVH.  Assessment and Plan:  1.  CAD status post DES x2 to the RCA in 2007 and  DES to the mid LAD as well as OM 2 in 2019.  He does not report any definite angina symptoms nitroglycerin use.  Refill provided for fresh bottle of nitroglycerin.  ECG reviewed and normal today.  Continue aspirin, Lipitor, Avapro, Imdur, and Lopressor.  2.  Carotid artery disease with bruits.  CTA from 2019 reviewed.  Arrange follow-up carotid Dopplers.  Continue aspirin and statin.  3.  Mixed hyperlipidemia with history of statin intolerance.  He has been on Lipitor 10 mg daily per PCP, tolerating so far.  I have talked with him about the possibility of PCSK9 inhibitors.  4.  Epigastric discomfort, recommended trial of over-the-counter Prilosec to see if this is effective.  Medication Adjustments/Labs and Tests Ordered: Current medicines are reviewed at length with the patient today.  Concerns regarding medicines are outlined above.   Tests Ordered: Orders Placed This Encounter  Procedures  . US Carotid Duplex Bilateral  . EKG 12-Lead    Medication Changes: Meds ordered this encounter  Medications  . DISCONTD: nitroGLYCERIN (NITROSTAT) 0.4 MG SL tablet    Sig: Place 1 tablet (0.4 mg total) under the tongue every 5 (five) minutes as needed for chest pain.    Dispense:  25 tablet    Refill:  3  . nitroGLYCERIN (NITROSTAT) 0.4 MG SL tablet     Sig: Place 1 tablet (0.4 mg total) under the tongue every 5 (five) minutes as needed for chest pain.    Dispense:  25 tablet    Refill:  3    Disposition:  Follow up 6 months in the [place] office.  Signed, Satira Sark, MD, [facility] 07/09/2019 11:50 AM    [facility] [place]. [address] [place], [place] [address] Phone: [phone]; Fax: [phone]

## 2019-07-09 ENCOUNTER — Encounter: Payer: Self-pay | Admitting: Cardiology

## 2019-07-09 ENCOUNTER — Other Ambulatory Visit: Payer: Self-pay

## 2019-07-09 ENCOUNTER — Ambulatory Visit: Payer: Medicare Other | Admitting: Cardiology

## 2019-07-09 VITALS — BP 138/80 | HR 94 | Ht 72.0 in | Wt 182.2 lb

## 2019-07-09 DIAGNOSIS — Z8679 Personal history of other diseases of the circulatory system: Secondary | ICD-10-CM

## 2019-07-09 DIAGNOSIS — E782 Mixed hyperlipidemia: Secondary | ICD-10-CM

## 2019-07-09 DIAGNOSIS — I25119 Atherosclerotic heart disease of native coronary artery with unspecified angina pectoris: Secondary | ICD-10-CM

## 2019-07-09 MED ORDER — NITROGLYCERIN 0.4 MG SL SUBL: 0.4000 mg | SUBLINGUAL_TABLET | SUBLINGUAL | 3 refills | Status: AC | PRN

## 2019-07-09 MED ORDER — NITROGLYCERIN 0.4 MG SL SUBL: 0.4000 mg | SUBLINGUAL_TABLET | SUBLINGUAL | 3 refills | Status: DC | PRN

## 2019-07-09 NOTE — Patient Instructions (Signed)
Medication Instructions:  Your physician recommends that you continue on your current medications as directed. Please refer to the Current Medication list given to you today.  *If you need a refill on your cardiac medications before your next appointment, please call your pharmacy*   Lab Work: None today   If you have labs (blood work) drawn today and your tests are completely normal, you will receive your results only by: . MyChart Message (if you have MyChart) OR . A paper copy in the mail If you have any lab test that is abnormal or we need to change your treatment, we will call you to review the results.   Testing/Procedures: Your physician has requested that you have a carotid duplex. This test is an ultrasound of the carotid arteries in your neck. It looks at blood flow through these arteries that supply the brain with blood. Allow one hour for this exam. There are no restrictions or special instructions.     Follow-Up: At [facility], you and your health needs are our priority.  As part of our continuing mission to provide you with exceptional heart care, we have created designated Provider Care Teams.  These Care Teams include your primary Cardiologist (physician) and Advanced Practice Providers (APPs -  Physician Assistants and Nurse Practitioners) who all work together to provide you with the care you need, when you need it.  We recommend signing up for the patient portal called "MyChart".  Sign up information is provided on this After Visit Summary.  MyChart is used to connect with patients for Virtual Visits (Telemedicine).  Patients are able to view lab/test results, encounter notes, upcoming appointments, etc.  Non-urgent messages can be sent to your provider as well.   To learn more about what you can do with MyChart, go to [email or web address].    Your next appointment:   6 month(s)  The format for your next appointment:   In Person  Provider:   Samuel  McDowell, MD   Other Instructions None     Thank you for choosing [facility] !         

## 2019-07-16 ENCOUNTER — Ambulatory Visit (HOSPITAL_COMMUNITY)
Admission: RE | Admit: 2019-07-16 | Discharge: 2019-07-16 | Disposition: A | Discharge status: Home / Self Care | Payer: Medicare Other | Source: Ambulatory Visit | Attending: Cardiology | Admitting: Cardiology

## 2019-07-16 ENCOUNTER — Other Ambulatory Visit: Payer: Self-pay

## 2019-07-16 DIAGNOSIS — I25119 Atherosclerotic heart disease of native coronary artery with unspecified angina pectoris: Secondary | ICD-10-CM | POA: Diagnosis not present

## 2019-07-16 DIAGNOSIS — I6522 Occlusion and stenosis of left carotid artery: Secondary | ICD-10-CM | POA: Diagnosis not present

## 2019-08-08 DIAGNOSIS — I10 Essential (primary) hypertension: Secondary | ICD-10-CM | POA: Diagnosis not present

## 2019-08-08 DIAGNOSIS — E785 Hyperlipidemia, unspecified: Secondary | ICD-10-CM | POA: Diagnosis not present

## 2019-08-08 DIAGNOSIS — E119 Type 2 diabetes mellitus without complications: Secondary | ICD-10-CM | POA: Diagnosis not present

## 2019-08-21 DIAGNOSIS — M545 Low back pain: Secondary | ICD-10-CM | POA: Diagnosis not present

## 2019-08-21 DIAGNOSIS — R319 Hematuria, unspecified: Secondary | ICD-10-CM | POA: Diagnosis not present

## 2019-08-21 DIAGNOSIS — I10 Essential (primary) hypertension: Secondary | ICD-10-CM | POA: Diagnosis not present

## 2019-09-14 DIAGNOSIS — R310 Gross hematuria: Secondary | ICD-10-CM | POA: Diagnosis not present

## 2019-09-14 DIAGNOSIS — E119 Type 2 diabetes mellitus without complications: Secondary | ICD-10-CM | POA: Diagnosis not present

## 2019-09-15 ENCOUNTER — Other Ambulatory Visit: Payer: Self-pay

## 2019-09-15 ENCOUNTER — Encounter (HOSPITAL_COMMUNITY): Payer: Self-pay | Admitting: Emergency Medicine

## 2019-09-15 ENCOUNTER — Emergency Department (HOSPITAL_COMMUNITY): Payer: Medicare Other

## 2019-09-15 ENCOUNTER — Inpatient Hospital Stay (HOSPITAL_COMMUNITY)
Admission: EM | Admit: 2019-09-15 | Discharge: 2019-09-17 | DRG: 069 | Disposition: A | Discharge status: Home / Self Care | Payer: Medicare Other | Attending: Internal Medicine | Admitting: Internal Medicine

## 2019-09-15 DIAGNOSIS — G8194 Hemiplegia, unspecified affecting left nondominant side: Secondary | ICD-10-CM | POA: Diagnosis present

## 2019-09-15 DIAGNOSIS — R4781 Slurred speech: Secondary | ICD-10-CM | POA: Diagnosis not present

## 2019-09-15 DIAGNOSIS — R918 Other nonspecific abnormal finding of lung field: Secondary | ICD-10-CM | POA: Diagnosis not present

## 2019-09-15 DIAGNOSIS — I959 Hypotension, unspecified: Secondary | ICD-10-CM | POA: Diagnosis not present

## 2019-09-15 DIAGNOSIS — Z888 Allergy status to other drugs, medicaments and biological substances status: Secondary | ICD-10-CM

## 2019-09-15 DIAGNOSIS — Z833 Family history of diabetes mellitus: Secondary | ICD-10-CM | POA: Diagnosis not present

## 2019-09-15 DIAGNOSIS — Z8249 Family history of ischemic heart disease and other diseases of the circulatory system: Secondary | ICD-10-CM | POA: Diagnosis not present

## 2019-09-15 DIAGNOSIS — Z87442 Personal history of urinary calculi: Secondary | ICD-10-CM

## 2019-09-15 DIAGNOSIS — G4489 Other headache syndrome: Secondary | ICD-10-CM | POA: Diagnosis not present

## 2019-09-15 DIAGNOSIS — R6889 Other general symptoms and signs: Secondary | ICD-10-CM | POA: Diagnosis not present

## 2019-09-15 DIAGNOSIS — G459 Transient cerebral ischemic attack, unspecified: Secondary | ICD-10-CM | POA: Diagnosis not present

## 2019-09-15 DIAGNOSIS — K5730 Diverticulosis of large intestine without perforation or abscess without bleeding: Secondary | ICD-10-CM | POA: Diagnosis not present

## 2019-09-15 DIAGNOSIS — E782 Mixed hyperlipidemia: Secondary | ICD-10-CM | POA: Diagnosis not present

## 2019-09-15 DIAGNOSIS — N179 Acute kidney failure, unspecified: Secondary | ICD-10-CM | POA: Diagnosis present

## 2019-09-15 DIAGNOSIS — R319 Hematuria, unspecified: Secondary | ICD-10-CM | POA: Diagnosis present

## 2019-09-15 DIAGNOSIS — Z8673 Personal history of transient ischemic attack (TIA), and cerebral infarction without residual deficits: Secondary | ICD-10-CM

## 2019-09-15 DIAGNOSIS — I6502 Occlusion and stenosis of left vertebral artery: Secondary | ICD-10-CM | POA: Diagnosis not present

## 2019-09-15 DIAGNOSIS — E119 Type 2 diabetes mellitus without complications: Secondary | ICD-10-CM

## 2019-09-15 DIAGNOSIS — I10 Essential (primary) hypertension: Secondary | ICD-10-CM | POA: Diagnosis present

## 2019-09-15 DIAGNOSIS — Z955 Presence of coronary angioplasty implant and graft: Secondary | ICD-10-CM | POA: Diagnosis not present

## 2019-09-15 DIAGNOSIS — F17210 Nicotine dependence, cigarettes, uncomplicated: Secondary | ICD-10-CM | POA: Diagnosis present

## 2019-09-15 DIAGNOSIS — R29818 Other symptoms and signs involving the nervous system: Secondary | ICD-10-CM | POA: Diagnosis not present

## 2019-09-15 DIAGNOSIS — Z20822 Contact with and (suspected) exposure to covid-19: Secondary | ICD-10-CM | POA: Diagnosis present

## 2019-09-15 DIAGNOSIS — E860 Dehydration: Secondary | ICD-10-CM | POA: Diagnosis present

## 2019-09-15 DIAGNOSIS — Z743 Need for continuous supervision: Secondary | ICD-10-CM | POA: Diagnosis not present

## 2019-09-15 DIAGNOSIS — R531 Weakness: Secondary | ICD-10-CM | POA: Diagnosis not present

## 2019-09-15 DIAGNOSIS — Z79899 Other long term (current) drug therapy: Secondary | ICD-10-CM

## 2019-09-15 DIAGNOSIS — I639 Cerebral infarction, unspecified: Secondary | ICD-10-CM | POA: Diagnosis not present

## 2019-09-15 DIAGNOSIS — I25110 Atherosclerotic heart disease of native coronary artery with unstable angina pectoris: Secondary | ICD-10-CM | POA: Diagnosis present

## 2019-09-15 DIAGNOSIS — I2510 Atherosclerotic heart disease of native coronary artery without angina pectoris: Secondary | ICD-10-CM | POA: Diagnosis present

## 2019-09-15 DIAGNOSIS — I4892 Unspecified atrial flutter: Secondary | ICD-10-CM | POA: Diagnosis present

## 2019-09-15 DIAGNOSIS — E1165 Type 2 diabetes mellitus with hyperglycemia: Secondary | ICD-10-CM | POA: Diagnosis present

## 2019-09-15 DIAGNOSIS — Z825 Family history of asthma and other chronic lower respiratory diseases: Secondary | ICD-10-CM

## 2019-09-15 DIAGNOSIS — R29702 NIHSS score 2: Secondary | ICD-10-CM | POA: Diagnosis not present

## 2019-09-15 DIAGNOSIS — I6389 Other cerebral infarction: Secondary | ICD-10-CM | POA: Diagnosis not present

## 2019-09-15 DIAGNOSIS — Z801 Family history of malignant neoplasm of trachea, bronchus and lung: Secondary | ICD-10-CM

## 2019-09-15 DIAGNOSIS — R310 Gross hematuria: Secondary | ICD-10-CM | POA: Diagnosis not present

## 2019-09-15 DIAGNOSIS — I700 Atherosclerosis of aorta: Secondary | ICD-10-CM | POA: Diagnosis not present

## 2019-09-15 DIAGNOSIS — N3289 Other specified disorders of bladder: Secondary | ICD-10-CM | POA: Diagnosis not present

## 2019-09-15 DIAGNOSIS — I6522 Occlusion and stenosis of left carotid artery: Secondary | ICD-10-CM | POA: Diagnosis not present

## 2019-09-15 DIAGNOSIS — M549 Dorsalgia, unspecified: Secondary | ICD-10-CM | POA: Diagnosis present

## 2019-09-15 DIAGNOSIS — Z7982 Long term (current) use of aspirin: Secondary | ICD-10-CM

## 2019-09-15 DIAGNOSIS — R55 Syncope and collapse: Secondary | ICD-10-CM | POA: Diagnosis not present

## 2019-09-15 DIAGNOSIS — I499 Cardiac arrhythmia, unspecified: Secondary | ICD-10-CM | POA: Diagnosis not present

## 2019-09-15 DIAGNOSIS — R001 Bradycardia, unspecified: Secondary | ICD-10-CM | POA: Diagnosis not present

## 2019-09-15 DIAGNOSIS — Z981 Arthrodesis status: Secondary | ICD-10-CM

## 2019-09-15 DIAGNOSIS — Z7984 Long term (current) use of oral hypoglycemic drugs: Secondary | ICD-10-CM

## 2019-09-15 LAB — URINALYSIS, ROUTINE W REFLEX MICROSCOPIC
Bacteria, UA: NONE SEEN
Bilirubin Urine: NEGATIVE
Glucose, UA: 50 mg/dL — AB
Ketones, ur: NEGATIVE mg/dL
Leukocytes,Ua: NEGATIVE
Nitrite: NEGATIVE
Protein, ur: 100 mg/dL — AB
RBC / HPF: >50 RBC/hpf — ABNORMAL HIGH (ref 0–5)
Specific Gravity, Urine: 1.042 — ABNORMAL HIGH (ref 1.005–1.030)
pH: 5 (ref 5.0–8.0)

## 2019-09-15 LAB — COMPREHENSIVE METABOLIC PANEL
ALT: 25 U/L (ref 0–44)
AST: 21 U/L (ref 15–41)
Albumin: 4 g/dL (ref 3.5–5.0)
Alkaline Phosphatase: 91 U/L (ref 38–126)
Anion gap: 10 (ref 5–15)
BUN: 34 mg/dL — ABNORMAL HIGH (ref 8–23)
CO2: 26 mmol/L (ref 22–32)
Calcium: 9.3 mg/dL (ref 8.9–10.3)
Chloride: 100 mmol/L (ref 98–111)
Creatinine, Ser: 2 mg/dL — ABNORMAL HIGH (ref 0.61–1.24)
GFR calc Af Amer: 37 mL/min — ABNORMAL LOW (ref >60)
GFR calc non Af Amer: 32 mL/min — ABNORMAL LOW (ref >60)
Glucose, Bld: 243 mg/dL — ABNORMAL HIGH (ref 70–99)
Potassium: 4.6 mmol/L (ref 3.5–5.1)
Sodium: 136 mmol/L (ref 135–145)
Total Bilirubin: 0.5 mg/dL (ref 0.3–1.2)
Total Protein: 6.9 g/dL (ref 6.5–8.1)

## 2019-09-15 LAB — RAPID URINE DRUG SCREEN, HOSP PERFORMED
Amphetamines: NOT DETECTED
Barbiturates: NOT DETECTED
Benzodiazepines: NOT DETECTED
Cocaine: NOT DETECTED
Opiates: NOT DETECTED
Tetrahydrocannabinol: NOT DETECTED

## 2019-09-15 LAB — CBC
HCT: 40.3 % (ref 39.0–52.0)
Hemoglobin: 13 g/dL (ref 13.0–17.0)
MCH: 29.1 pg (ref 26.0–34.0)
MCHC: 32.3 g/dL (ref 30.0–36.0)
MCV: 90.2 fL (ref 80.0–100.0)
Platelets: 194 10*3/uL (ref 150–400)
RBC: 4.47 MIL/uL (ref 4.22–5.81)
RDW: 14.4 % (ref 11.5–15.5)
WBC: 7.9 10*3/uL (ref 4.0–10.5)
nRBC: 0 % (ref 0.0–0.2)

## 2019-09-15 LAB — DIFFERENTIAL
Abs Immature Granulocytes: 0.02 10*3/uL (ref 0.00–0.07)
Basophils Absolute: 0.1 10*3/uL (ref 0.0–0.1)
Basophils Relative: 1 %
Eosinophils Absolute: 0.3 10*3/uL (ref 0.0–0.5)
Eosinophils Relative: 3 %
Immature Granulocytes: 0 %
Lymphocytes Relative: 22 %
Lymphs Abs: 1.7 10*3/uL (ref 0.7–4.0)
Monocytes Absolute: 0.6 10*3/uL (ref 0.1–1.0)
Monocytes Relative: 8 %
Neutro Abs: 5.2 10*3/uL (ref 1.7–7.7)
Neutrophils Relative %: 66 %

## 2019-09-15 LAB — PROTIME-INR
INR: 1 (ref 0.8–1.2)
Prothrombin Time: 12.8 seconds (ref 11.4–15.2)

## 2019-09-15 LAB — APTT: aPTT: 24 seconds (ref 24–36)

## 2019-09-15 LAB — CBG MONITORING, ED: Glucose-Capillary: 214 mg/dL — ABNORMAL HIGH (ref 70–99)

## 2019-09-15 LAB — ETHANOL: Alcohol, Ethyl (B): <10 mg/dL (ref <10)

## 2019-09-15 MED ORDER — IOHEXOL 350 MG/ML SOLN
100.0000 mL | Freq: Once | INTRAVENOUS | Status: AC | PRN
  Administered 2019-09-15: 100 mL via INTRAVENOUS

## 2019-09-15 NOTE — H&P (Addendum)
TRH H&P    Patient Demographics:    Richard [name], is a 74 y.o. male  MRN: [id number]  DOB - 02/07/1946  Admit Date - 09/15/2019  Referring MD/NP/PA: Dr. Sabra Heck  Outpatient Primary MD for the patient is Wannetta Sender, FNP  Patient coming from: home  Chief complaint- headache and diaphoresis   HPI:    Richard [name]  is a 74 y.o. male, with history of type 2 diabetes mellitus, hyperlipidemia, transient ischemic attack, hypertension, CAD, and questionable atrial flutter presents to the ED with a chief complaint of feeling badly.  Patient reports that between 7 and 8 PM he was getting ready to eat supper when he had sudden onset of headache and diaphoresis.  He felt like he was going to collapse.  He had generalized weakness, and wife could barely get him to his recliner chair.  Wife reports slurred, slow speech, and that he was panicky.  Patient reports dyspnea and epigastric abdominal pain secondary to anxiety.  Wife reports that he had noncoherent speech at this time as well.  Patient describes that his headache is located behind his left eye and wrapped around the back of his head to the left.  He describes the pain as sharp.  It was severe.  The headache is resolved now.  Patient reports that, although he was generally weak, he felt that he had more weakness on the left side. Patient feels as though he is back to baseline at this time. Patient is otherwise been in his normal state of health.  He does report that he has had hematuria, for which he is seeing his PCP.  Their is post to follow-up with urology.  Patient reports no dysuria.  Patient has had nephrolithiasis before. Patient is a current smoker.  He reports it relaxes him.  He enjoys it and does not plan to quit.  He has tried to quit before with Chantix, and patches without success.    Review of systems:    Review of Systems  Constitutional:  Positive for diaphoresis and malaise/fatigue. Negative for chills and fever.  HENT: Negative for congestion, sinus pain and sore throat.   Eyes: Positive for pain. Negative for blurred vision, double vision and discharge.  Respiratory: Positive for shortness of breath. Negative for cough, sputum production and wheezing.   Cardiovascular: Negative for chest pain, palpitations and leg swelling.  Gastrointestinal: Positive for abdominal pain (epigastric pain). Negative for nausea and vomiting.  Genitourinary: Positive for hematuria. Negative for dysuria, frequency and urgency.  Musculoskeletal: Negative for myalgias.  Neurological: Positive for speech change, focal weakness and headaches.  Psychiatric/Behavioral: Negative for substance abuse.    All other systems reviewed and are negative.    Past History of the following :    Past Medical History:  Diagnosis Date  . CAD (coronary artery disease)    a. 2007 - DES x 2 proximal to mid RCA b. 09/2017: DES to mid LAD and OM2. Patent stents along RCA.   . Essential hypertension   . History of atrial  flutter    Status post ablation 2008 - Dr. Caryl Comes  . History of transient ischemic attack (TIA)   . Mixed hyperlipidemia    Statin intolerance  . Pneumonia   . Type 2 diabetes mellitus (HCC)    A1C 11.5 06/2017      Past Surgical History:  Procedure Laterality Date  . BALLOON ANGIOPLASTY, ARTERY    . CARDIAC ELECTROPHYSIOLOGY MAPPING AND ABLATION    . CARPAL TUNNEL RELEASE Bilateral   . cervical neck fusion     x 4  . CORONARY STENT INTERVENTION N/A 10/17/2017   Procedure: CORONARY STENT INTERVENTION;  Surgeon: Martinique, Peter M, MD;  Location: [facility];  Service: Cardiovascular;  Laterality: N/A;  . CORONARY STENT PLACEMENT     x 2  . LEFT HEART CATH AND CORONARY ANGIOGRAPHY N/A 10/17/2017   Procedure: LEFT HEART CATH AND CORONARY ANGIOGRAPHY;  Surgeon: Martinique, Peter M, MD;  Location: [facility];  Service: Cardiovascular;   Laterality: N/A;  . ROTATOR CUFF REPAIR Right    x 4  . TIBIA FRACTURE SURGERY Left       Social History:      Social History   Tobacco Use  . Smoking status: Current Every Day Smoker    Packs/day: 1.00    Years: 54.00    Pack years: 54.00    Types: Cigarettes    Last attempt to quit: 07/11/2017    Years since quitting: 2.1  . Smokeless tobacco: Never Used  Substance Use Topics  . Alcohol use: No    Alcohol/week: 0.0 standard drinks    Comment: rare       Family History :     Family History  Problem Relation Age of Onset  . Diabetes Mother   . Heart disease Mother   . Heart disease Brother   . Emphysema Father   . Lung cancer Brother        x 2  . Colon cancer Neg Hx   . Esophageal cancer Neg Hx   . Rectal cancer Neg Hx   . Stomach cancer Neg Hx   . Prostate cancer Neg Hx       Home Medications:   Prior to Admission medications   Medication Sig Start Date End Date Taking? Authorizing Provider  glipiZIDE (GLUCOTROL XL) 10 MG 24 hr tablet Take 10 mg by mouth 2 (two) times daily. 07/16/19  Yes [provider]  pioglitazone (ACTOS) 30 MG tablet Take 30 mg by mouth daily. 07/27/19  Yes [provider]  aspirin 325 MG EC tablet Take 325 mg by mouth daily.    [provider]  atorvastatin (LIPITOR) 10 MG tablet Take 10 mg by mouth daily. 06/08/19   [provider]  irbesartan (AVAPRO) 300 MG tablet Take 300 mg by mouth daily.    [provider]  isosorbide mononitrate (IMDUR) 30 MG 24 hr tablet Take 1 tablet (30 mg total) by mouth at bedtime. 05/04/18   Satira Sark, MD  metoprolol tartrate (LOPRESSOR) 100 MG tablet Take 1 tablet (100 mg total) by mouth 2 (two) times daily. 10/19/17   Marrianne Mood D, PA-C  nitroGLYCERIN (NITROSTAT) 0.4 MG SL tablet Place 1 tablet (0.4 mg total) under the tongue every 5 (five) minutes as needed for chest pain. 07/09/19   Satira Sark, MD     Allergies:     Allergies    Allergen Reactions  . Statins     Leg pain  Physical Exam:   Vitals  Blood pressure (!) 146/92, pulse 51, temperature 97.6 F (36.4 C), temperature source Oral, resp. rate 15, height 6' (1.829 m), weight 81.2 kg, SpO2 99 %.  1.  General: Lying supine in bed in no acute distress  2. Psychiatric: Mood and behavior are normal for situation  3. Neurologic: CN II-XII intact Finger to nose intact Upper extremities are equal in strength Lower extremities are 5/5 BL, but left is slightly weaker than right Speech has returned to baseline  4. HEENMT:  Head is atraumatic normocephalic Pupils reactive to light Mucous membranes moist Trachea midline  5. Respiratory : LCTABL  6. Cardiovascular : HR normal Rhythm is regular with occasional PVCs No murmurs  7. Gastrointestinal:  Soft, nondistended, non tender,  Bowel sounds active  8. Skin:  No acute lesions on limited skin exam  9.Musculoskeletal:  No deformity or peripheral edema    Data Review:    CBC Recent Labs  Lab 09/15/19 2057  WBC 7.9  HGB 13.0  HCT 40.3  PLT 194  MCV 90.2  MCH 29.1  MCHC 32.3  RDW 14.4  LYMPHSABS 1.7  MONOABS 0.6  EOSABS 0.3  BASOSABS 0.1   ------------------------------------------------------------------------------------------------------------------  Results for orders placed or performed during the hospital encounter of 09/15/19 (from the past 48 hour(s))  Ethanol     Status: None   Collection Time: 09/15/19  8:57 PM  Result Value Ref Range   Alcohol, Ethyl (B) <10 <10 mg/dL    Comment: (NOTE) Lowest detectable limit for serum alcohol is 10 mg/dL.  For medical purposes only. Performed at [facility], [address], [place], [place] [address]   Protime-INR     Status: None   Collection Time: 09/15/19  8:57 PM  Result Value Ref Range   Prothrombin Time 12.8 11.4 - 15.2 seconds   INR 1.0 0.8 - 1.2    Comment: (NOTE) INR goal varies based on device and disease  states. Performed at [facility], [address], [place], [place] [address]   APTT     Status: None   Collection Time: 09/15/19  8:57 PM  Result Value Ref Range   aPTT 24 24 - 36 seconds    Comment: Performed at [facility], [address] [place], [place] [address]  CBC     Status: None   Collection Time: 09/15/19  8:57 PM  Result Value Ref Range   WBC 7.9 4.0 - 10.5 K/uL   RBC 4.47 4.22 - 5.81 MIL/uL   Hemoglobin 13.0 13.0 - 17.0 g/dL   HCT 40.3 39 - 52 %   MCV 90.2 80.0 - 100.0 fL   MCH 29.1 26.0 - 34.0 pg   MCHC 32.3 30.0 - 36.0 g/dL   RDW 14.4 11.5 - 15.5 %   Platelets 194 150 - 400 K/uL   nRBC 0.0 0.0 - 0.2 %    Comment: Performed at [facility], [address], [place], [place] [address]  Differential     Status: None   Collection Time: 09/15/19  8:57 PM  Result Value Ref Range   Neutrophils Relative % 66 %   Neutro Abs 5.2 1.7 - 7.7 K/uL   Lymphocytes Relative 22 %   Lymphs Abs 1.7 0.7 - 4.0 K/uL   Monocytes Relative 8 %   Monocytes Absolute 0.6 0 - 1 K/uL   Eosinophils Relative 3 %   Eosinophils Absolute 0.3 0 - 0 K/uL   Basophils Relative 1 %  Basophils Absolute 0.1 0 - 0 K/uL   Immature Granulocytes 0 %   Abs Immature Granulocytes 0.02 0.00 - 0.07 K/uL    Comment: Performed at [facility], [address], [place], [place] [address]  Comprehensive metabolic panel     Status: Abnormal   Collection Time: 09/15/19  8:57 PM  Result Value Ref Range   Sodium 136 135 - 145 mmol/L   Potassium 4.6 3.5 - 5.1 mmol/L   Chloride 100 98 - 111 mmol/L   CO2 26 22 - 32 mmol/L   Glucose, Bld 243 (H) 70 - 99 mg/dL    Comment: Glucose reference range applies only to samples taken after fasting for at least 8 hours.   BUN 34 (H) 8 - 23 mg/dL   Creatinine, Ser 2.00 (H) 0.61 - 1.24 mg/dL   Calcium 9.3 8.9 - 10.3 mg/dL   Total Protein 6.9 6.5 - 8.1 g/dL   Albumin 4.0 3.5 - 5.0 g/dL   AST 21 15 - 41 U/L   ALT 25 0 - 44 U/L   Alkaline Phosphatase 91 38 - 126 U/L   Total  Bilirubin 0.5 0.3 - 1.2 mg/dL   GFR calc non Af Amer 32 (L) >60 mL/min   GFR calc Af Amer 37 (L) >60 mL/min   Anion gap 10 5 - 15    Comment: Performed at [facility], [address], [place], [place] [address]  CBG monitoring, ED     Status: Abnormal   Collection Time: 09/15/19  9:30 PM  Result Value Ref Range   Glucose-Capillary 214 (H) 70 - 99 mg/dL    Comment: Glucose reference range applies only to samples taken after fasting for at least 8 hours.    Chemistries  Recent Labs  Lab 09/15/19 2057  NA 136  K 4.6  CL 100  CO2 26  GLUCOSE 243*  BUN 34*  CREATININE 2.00*  CALCIUM 9.3  AST 21  ALT 25  ALKPHOS 91  BILITOT 0.5   ------------------------------------------------------------------------------------------------------------------  ------------------------------------------------------------------------------------------------------------------ GFR: Estimated Creatinine Clearance: 36.1 mL/min (A) (by C-G formula based on SCr of 2 mg/dL (H)). Liver Function Tests: Recent Labs  Lab 09/15/19 2057  AST 21  ALT 25  ALKPHOS 91  BILITOT 0.5  PROT 6.9  ALBUMIN 4.0   No results for input(s): LIPASE, AMYLASE in the last 168 hours. No results for input(s): AMMONIA in the last 168 hours. Coagulation Profile: Recent Labs  Lab 09/15/19 2057  INR 1.0   Cardiac Enzymes: No results for input(s): CKTOTAL, CKMB, CKMBINDEX, TROPONINI in the last 168 hours. BNP (last 3 results) No results for input(s): PROBNP in the last 8760 hours. HbA1C: No results for input(s): HGBA1C in the last 72 hours. CBG: Recent Labs  Lab 09/15/19 2130  GLUCAP 214*   Lipid Profile: No results for input(s): CHOL, HDL, LDLCALC, TRIG, CHOLHDL, LDLDIRECT in the last 72 hours. Thyroid Function Tests: No results for input(s): TSH, T4TOTAL, FREET4, T3FREE, THYROIDAB in the last 72 hours. Anemia Panel: No results for input(s): VITAMINB12, FOLATE, FERRITIN, TIBC, IRON, RETICCTPCT in the last 72  hours.  --------------------------------------------------------------------------------------------------------------- Urine analysis:    Component Value Date/Time   COLORURINE YELLOW 01/04/2007 1000   APPEARANCEUR CLEAR 01/04/2007 1000   LABSPEC 1.022 01/04/2007 1000   PHURINE 5.5 01/04/2007 1000   GLUCOSEU >1000 (A) 01/04/2007 1000   HGBUR NEGATIVE 01/04/2007 1000   BILIRUBINUR NEGATIVE 01/04/2007 1000   KETONESUR NEGATIVE 01/04/2007 1000   PROTEINUR NEGATIVE 01/04/2007 1000   UROBILINOGEN  0.2 01/04/2007 1000   NITRITE NEGATIVE 01/04/2007 1000   LEUKOCYTESUR NEGATIVE 01/04/2007 1000      Imaging Results:    CT Angio Head W or Wo Contrast  Result Date: 09/15/2019 CLINICAL DATA:  New onset left-sided weakness. EXAM: CT ANGIOGRAPHY HEAD AND NECK TECHNIQUE: Multidetector CT imaging of the head and neck was performed using the standard protocol during bolus administration of intravenous contrast. Multiplanar CT image reconstructions and MIPs were obtained to evaluate the vascular anatomy. Carotid stenosis measurements (when applicable) are obtained utilizing NASCET criteria, using the distal internal carotid diameter as the denominator. CONTRAST:  131mL OMNIPAQUE IOHEXOL 350 MG/ML SOLN COMPARISON:  CT head without contrast 09/15/2019. CTA head and neck 06/27/2017 FINDINGS: CTA NECK FINDINGS Aortic arch: Dense atherosclerotic calcifications are again seen at the aortic arch and origins the great vessels without significant stenosis. No significant interval change is present. Right carotid system: The right common carotid artery is within normal limits. Bifurcation is unremarkable. Cervical right ICA is normal. Left carotid system: Left common carotid artery is somewhat irregular without focal stenosis. Minimal calcifications present bifurcation without significant stenosis. Cervical left ICA is normal. Vertebral arteries: The right vertebral artery is the dominant vessel. The left vertebral  artery is hypoplastic. Segmental stenoses are present in the left vertebral artery in the neck. Moderate stenosis is present at C2, C4, and C6-7. Skeleton: Cervical spine is fused C2-6. Slight adjacent level anterolisthesis at C7-T1 is stable. Other neck: Soft tissues of the neck are otherwise unremarkable. Upper chest: Centrilobular emphysematous changes are present. Paraseptal emphysematous changes are present as well. Bilateral ill-defined pulmonary nodules measure up to 5.5 mm on the right and 5.8 cm on the left. Thoracic inlet is within normal limits. Review of the MIP images confirms the above findings CTA HEAD FINDINGS Anterior circulation: Atherosclerotic calcifications are present within the cavernous internal carotid arteries bilaterally. Mild pre cavernous left ICA narrowing is present measuring 60% relative to the more distal vessel. ICA termini are within normal limits. The A1 and M1 segments are normal. Anterior communicating artery is patent. MCA bifurcations are intact. Early bifurcation is present on the right, a normal variant. ACA and MCA branch vessels are within normal limits. Posterior circulation: High-grade stenosis is present in the hypoplastic left vertebral artery at the dural margin. The right vertebral artery is within normal limits. Vertebrobasilar junction is normal. Mild narrowing is present in the proximal basilar artery. Basilar artery is otherwise within normal limits. Both posterior cerebral arteries originate from the basilar tip. Mild narrowing is present in the proximal left P2 segment. Branch vessels are within normal limits bilaterally. Venous sinuses: Dural sinuses are patent. The straight sinus and deep cerebral veins are intact. Cortical veins are unremarkable. Anatomic variants: None Review of the MIP images confirms the above findings IMPRESSION: 1. No emergent large vessel occlusion. 2. 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  Multiple segmental stenoses are present in the left vertebral artery throughout the neck. 3. Mild narrowing of the proximal basilar artery and proximal left P2 segment. 4. Mild narrowing of the pre cavernous left ICA measuring 60% relative to the more distal vessel. 5. No significant stenosis in the neck. 6. Multiple bilateral pulmonary nodules measuring up to 5 mm. These are likely inflammatory. Recommend CT of the chest without contrast for follow-up in 2-3 months. 7. Aortic Atherosclerosis (ICD10-I70.0) and Emphysema (ICD10-J43.9). These results were called by telephone at the time of interpretation on 09/15/2019 at 9:41 pm  to provider Noemi Chapel , who verbally acknowledged these results. Electronically Signed   By: San Morelle M.D.   On: 09/15/2019 21:43   CT Angio Neck W and/or Wo Contrast  Result Date: 09/15/2019 CLINICAL DATA:  New onset left-sided weakness. EXAM: CT ANGIOGRAPHY HEAD AND NECK TECHNIQUE: Multidetector CT imaging of the head and neck was performed using the standard protocol during bolus administration of intravenous contrast. Multiplanar CT image reconstructions and MIPs were obtained to evaluate the vascular anatomy. Carotid stenosis measurements (when applicable) are obtained utilizing NASCET criteria, using the distal internal carotid diameter as the denominator. CONTRAST:  159mL OMNIPAQUE IOHEXOL 350 MG/ML SOLN COMPARISON:  CT head without contrast 09/15/2019. CTA head and neck 06/27/2017 FINDINGS: CTA NECK FINDINGS Aortic arch: Dense atherosclerotic calcifications are again seen at the aortic arch and origins the great vessels without significant stenosis. No significant interval change is present. Right carotid system: The right common carotid artery is within normal limits. Bifurcation is unremarkable. Cervical right ICA is normal. Left carotid system: Left common carotid artery is somewhat irregular without focal stenosis. Minimal calcifications present bifurcation  without significant stenosis. Cervical left ICA is normal. Vertebral arteries: The right vertebral artery is the dominant vessel. The left vertebral artery is hypoplastic. Segmental stenoses are present in the left vertebral artery in the neck. Moderate stenosis is present at C2, C4, and C6-7. Skeleton: Cervical spine is fused C2-6. Slight adjacent level anterolisthesis at C7-T1 is stable. Other neck: Soft tissues of the neck are otherwise unremarkable. Upper chest: Centrilobular emphysematous changes are present. Paraseptal emphysematous changes are present as well. Bilateral ill-defined pulmonary nodules measure up to 5.5 mm on the right and 5.8 cm on the left. Thoracic inlet is within normal limits. Review of the MIP images confirms the above findings CTA HEAD FINDINGS Anterior circulation: Atherosclerotic calcifications are present within the cavernous internal carotid arteries bilaterally. Mild pre cavernous left ICA narrowing is present measuring 60% relative to the more distal vessel. ICA termini are within normal limits. The A1 and M1 segments are normal. Anterior communicating artery is patent. MCA bifurcations are intact. Early bifurcation is present on the right, a normal variant. ACA and MCA branch vessels are within normal limits. Posterior circulation: High-grade stenosis is present in the hypoplastic left vertebral artery at the dural margin. The right vertebral artery is within normal limits. Vertebrobasilar junction is normal. Mild narrowing is present in the proximal basilar artery. Basilar artery is otherwise within normal limits. Both posterior cerebral arteries originate from the basilar tip. Mild narrowing is present in the proximal left P2 segment. Branch vessels are within normal limits bilaterally. Venous sinuses: Dural sinuses are patent. The straight sinus and deep cerebral veins are intact. Cortical veins are unremarkable. Anatomic variants: None Review of the MIP images confirms the  above findings IMPRESSION: 1. No emergent large vessel occlusion. 2. 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 Multiple segmental stenoses are present in the left vertebral artery throughout the neck. 3. Mild narrowing of the proximal basilar artery and proximal left P2 segment. 4. Mild narrowing of the pre cavernous left ICA measuring 60% relative to the more distal vessel. 5. No significant stenosis in the neck. 6. Multiple bilateral pulmonary nodules measuring up to 5 mm. These are likely inflammatory. Recommend CT of the chest without contrast for follow-up in 2-3 months. 7. Aortic Atherosclerosis (ICD10-I70.0) and Emphysema (ICD10-J43.9). These results were called by telephone at the time of interpretation on 09/15/2019 at 9:41  pm to provider Noemi Chapel , who verbally acknowledged these results. Electronically Signed   By: San Morelle M.D.   On: 09/15/2019 21:43   CT HEAD CODE STROKE WO CONTRAST  Result Date: 09/15/2019 CLINICAL DATA:  Code stroke. New onset left-sided weakness beginning 1 hour ago. EXAM: CT HEAD WITHOUT CONTRAST TECHNIQUE: Contiguous axial images were obtained from the base of the skull through the vertex without intravenous contrast. COMPARISON:  CT head without contrast/6/19 FINDINGS: Brain: No acute infarct, hemorrhage, or mass lesion is present. Mild white matter changes are stable, and likely within normal limits for age. Basal ganglia are intact. Insular ribbon is normal bilaterally. The ventricles are of normal size. No significant extraaxial fluid collection is present. The brainstem and cerebellum are within normal limits. Vascular: Atherosclerotic calcifications are present within the cavernous internal carotid arteries bilaterally. No hyperdense vessel is present. Skull: Calvarium is intact. No focal lytic or blastic lesions are present. No significant extracranial soft tissue lesion is present. Sinuses/Orbits: The paranasal  sinuses and mastoid air cells are clear. The globes and orbits are within normal limits. ASPECTS [facility] Stroke Program Early CT Score) - Ganglionic level infarction (caudate, lentiform nuclei, internal capsule, insula, M1-M3 cortex): 7/7 - Supraganglionic infarction (M4-M6 cortex): 3/3 Total score (0-10 with 10 being normal): 10/10 IMPRESSION: 1. Stable mild white matter disease. 2. No acute intracranial abnormality or significant interval change. 3. ASPECTS is 10/10. These results were called by telephone at the time of interpretation on 09/15/2019 at 9:12 pm to provider [facility] , who verbally acknowledged these results. Electronically Signed   By: San Morelle M.D.   On: 09/15/2019 21:13    My personal review of EKG: Rhythm NSR, Rate 74 /min, QTc 454 ,no Acute ST changes   Assessment & Plan:    Active Problems:   TIA (transient ischemic attack)   1. TIA vs CVA 1. Left sided weakness, headache, slurred speech 2. NIHSS of 2 with tele neuro 3. Advised against TPA given improving symptoms and hematuria 4. CTA, CT =Abnormal without emergent large vessel occlusion. See details of in radiology report. 5. CT head - Mild white matter disease. No acute intracranial abnormality or significant interval change 6. MRI ordered for Monday 7. Continue aspirin and statin 8. Holding antihypertensives 9. Monitor on tele 10. Stroke order set utilized 2. AKI 1. Cr baseline 1.20 2. Cr today 2.0 3. Patient reports hematuria that he has seen his PCP for 4. Urology referral by PCP pending 5. UA here pending 6. Continue fluids 7. Continue to monitor 3. HTN 1. Holding antihypertensives for permissive HTN 2. BP stable at admission 4. T2DM 1. Holding oral hypoglycemics 2. Sliding scale coverage 3. HgbA1C pending 5. Hematuria 1. UA pending 2. Hgb stable at 13 3. Trend cbc in the AM   DVT Prophylaxis-   SCDs  AM Labs Ordered, also please review Full Orders  Family Communication:  Admission, patients condition and plan of care including tests being ordered have been discussed with the patient and wife who indicate understanding and agree with the plan and Code Status.  Code Status:  Full  Admission status: Inpatient :The appropriate admission status for this patient is INPATIENT. Inpatient status is judged to be reasonable and necessary in order to provide the required intensity of service to ensure the patient's safety. The patient's presenting symptoms, physical exam findings, and initial radiographic and laboratory data in the context of their chronic comorbidities is felt to place them at high risk for  further clinical deterioration. Furthermore, it is not anticipated that the patient will be medically stable for discharge from the hospital within 2 midnights of admission. The following factors support the admission status of inpatient.     The patient's presenting symptoms include Left sided weakness, headache, diaphoresis The worrisome physical exam findings include NIHSS 2 The initial radiographic and laboratory data are worrisome because of stenosis on CTA The chronic co-morbidities include previous TIA, HTN, DM, CAD       * I certify that at the point of admission it is my clinical judgment that the patient will require inpatient hospital care spanning beyond 2 midnights from the point of admission due to high intensity of service, high risk for further deterioration and high frequency of surveillance required.*  Time spent in minutes : Rodney

## 2019-09-15 NOTE — Consult Note (Signed)
[facility] Tele[facility]urology Consult Services   Date of Service:   09/15/2019 21:13:49  Impression:     .  I63.9 - Cerebrovascular accident (CVA), unspecified mechanism ([place])  Comments/Sign-Out: 74 year old male who presents to the hospital because of left side weakness. Presentation is concerning for possible small vessel stroke.  Metrics: Last Known Well: 09/15/2019 20:00:00 [facility] Notification Time: 09/15/2019 21:13:49 Arrival Time: 09/15/2019 20:49:00 Stamp Time: 09/15/2019 21:13:49 Telephone Response Time: 09/15/2019 21:21:00 Time First Login Attempt: 09/15/2019 21:21:00 Symptoms: Left side weakness NIHSS Start Assessment Time: 09/15/2019 21:30:14 Patient is not a candidate for Thrombolytic. Thrombolytic Medical Decision: 09/15/2019 21:35:00 Patient was not deemed candidate for Thrombolytic because of following reasons: No disabling symptoms. Patient reports severe GU bleeding recently and does not want to get TPA given low NIHSS. .  CT head showed no acute hemorrhage or acute core infarct.  ED Physician notified of diagnostic impression and management plan on 09/15/2019 21:47:19  Advanced Imaging: CTA Head and Neck Completed.  LVO:No   Our recommendations are outlined below.  Recommendations:     .  Activate Stroke Protocol Admission/Order Set     .  Stroke/Telemetry Floor     .  Neuro Checks     .  Bedside Swallow Eval     .  DVT Prophylaxis     .  IV Fluids, Normal Saline     .  Head of Bed 30 Degrees     .  Euglycemia and Avoid Hyperthermia (PRN Acetaminophen)     .  Antiplatelet Therapy Recommended  Routine Consultation with [facility] for Follow up Care  Sign Out:     .  Discussed with Emergency Department Provider    ------------------------------------------------------------------------------  History of Present Illness: Patient is a 74 year old Male.  Patient was brought by EMS for symptoms of Left side  weakness  74 year old male with a history significant for CAD and A flutter s/p ablation who presents to the hospital because of left side weakness. Patient was getting ready to eat dinner and was standing when he suddenly went weak on the left and had a severe 9/10 headache. On exam patient reported he was improving but still had mild left leg weakness.    Past Medical History:     . Coronary Artery Disease     . A flutter s/p ablation    Antiplatelet use:      Examination: BP(140/82), Pulse(74), Blood Glucose(214) 1A: Level of Consciousness - Alert; keenly responsive + 0 1B: Ask Month and Age - Both Questions Right + 0 1C: Blink Eyes & Squeeze Hands - Performs Both Tasks + 0 2: Test Horizontal Extraocular Movements - Normal + 0 3: Test Visual Fields - No Visual Loss + 0 4: Test Facial Palsy (Use Grimace if Obtunded) - Normal symmetry + 0 5A: Test Left Arm Motor Drift - No Drift for 10 Seconds + 0 5B: Test Right Arm Motor Drift - No Drift for 10 Seconds + 0 6A: Test Left Leg Motor Drift - Drift, but doesn't hit bed + 1 6B: Test Right Leg Motor Drift - No Drift for 5 Seconds + 0 7: Test Limb Ataxia (FNF/Heel-Shin) - No Ataxia + 0 8: Test Sensation - Mild-Moderate Loss: Less Sharp/More Dull + 1 9: Test Language/Aphasia - Normal; No aphasia + 0 10: Test Dysarthria - Normal + 0 11: Test Extinction/Inattention - No abnormality + 0  NIHSS Score: 2  Pre-Morbid Modified Rankin Scale: Unable to assess  Patient/Family was informed the Neurology Consult would occur via TeleHealth consult by way of interactive audio and video telecommunications and consented to receiving care in this manner.   Patient is being evaluated for possible acute neurologic impairment and high probability of imminent or life-threatening deterioration. I spent total of 30 minutes providing care to this patient, including time for face to face visit via telemedicine, review of medical records, imaging studies  and discussion of findings with providers, the patient and/or family.   Dr Tsosie Billing   [facility] [phone]  Case [id number]

## 2019-09-15 NOTE — ED Notes (Signed)
Wife at bedside.

## 2019-09-15 NOTE — ED Provider Notes (Signed)
[facility] EMERGENCY DEPARTMENT Provider Note   CSN: [id number] Arrival date & time: 09/15/19  2049  An emergency department physician performed an initial assessment on this suspected stroke patient at 2049.  History Chief Complaint  Patient presents with  . Code Stroke    Richard [name] is a 74 y.o. male.  HPI   This patient is a 74 year old male, he has a known history of a transient ischemic attack in the past, history of type 2 diabetes, history of hypertension and atrial flutter status post ablation. He currently is taking medications including a full aspirin but no other anticoagulants.  He presents on EMS transport with a complaint of left-sided weakness which occurred acutely at 8:00 PM, this was approximately 50 minutes prior to arrival. This weakness occurred while he was helping his wife make dinner. Evidently the patient was having a severe left-sided headache, he was very sweaty, felt like he was going to pass out and developed left-sided weakness including both the arm and the leg with some difficulty speaking. By the time the paramedics arrived they noticed upper and lower left-sided weakness in extremities as well as a possible difficulties speech problem according to the wife. The patient was not noted to have any right-sided symptoms or visual symptoms. He was recently told that he had blood behind his left eye at the ophthalmologist exam office.  The patient symptoms have gradually improved but he is still having left leg weakness. He was noted to be diaphoretic and hypotensive according to paramedics who found his blood pressure in the upper 80s, this has improved in route to the hospital. He is not having any chest pain shortness of breath or abdominal pain, no nausea vomiting or diarrhea.  I discussed the case with the neurologist on-call through teleneurology, they agree with activating code stroke, considering TPA  Past Medical History:  Diagnosis Date  . CAD  (coronary artery disease)    a. 2007 - DES x 2 proximal to mid RCA b. 09/2017: DES to mid LAD and OM2. Patent stents along RCA.   . Essential hypertension   . History of atrial flutter    Status post ablation 2008 - Dr. Caryl Comes  . History of transient ischemic attack (TIA)   . Mixed hyperlipidemia    Statin intolerance  . Pneumonia   . Type 2 diabetes mellitus ([place])    A1C 11.5 06/2017    Patient Active Problem List   Diagnosis Date Noted  . Hyperlipidemia associated with type 2 diabetes mellitus (HCC) - Statin intolerant 10/18/2017  . Unstable angina ([place])   . Chest pain 10/16/2017  . Atrial flutter ([place]) - s/p ablation   . TIA (transient ischemic attack) 06/27/2017  . Essential hypertension 11/14/2012  . RLL pneumonia 11/14/2012  . Type 2 diabetes mellitus with complication, with long-term current use of insulin ([place]) 11/14/2012  . Coronary artery disease involving native coronary artery of native heart with unstable angina pectoris ([place]) 11/14/2012  . HIATAL HERNIA 11/24/1983    Past Surgical History:  Procedure Laterality Date  . BALLOON ANGIOPLASTY, ARTERY    . CARDIAC ELECTROPHYSIOLOGY MAPPING AND ABLATION    . CARPAL TUNNEL RELEASE Bilateral   . cervical neck fusion     x 4  . CORONARY STENT INTERVENTION N/A 10/17/2017   Procedure: CORONARY STENT INTERVENTION;  Surgeon: Martinique, Peter M, MD;  Location: [facility];  Service: Cardiovascular;  Laterality: N/A;  . CORONARY STENT PLACEMENT     x 2  .  LEFT HEART CATH AND CORONARY ANGIOGRAPHY N/A 10/17/2017   Procedure: LEFT HEART CATH AND CORONARY ANGIOGRAPHY;  Surgeon: Martinique, Peter M, MD;  Location: [facility];  Service: Cardiovascular;  Laterality: N/A;  . ROTATOR CUFF REPAIR Right    x 4  . TIBIA FRACTURE SURGERY Left        Family History  Problem Relation Age of Onset  . Diabetes Mother   . Heart disease Mother   . Heart disease Brother   . Emphysema Father   . Lung cancer Brother        x 2  .  Colon cancer Neg Hx   . Esophageal cancer Neg Hx   . Rectal cancer Neg Hx   . Stomach cancer Neg Hx   . Prostate cancer Neg Hx     Social History   Tobacco Use  . Smoking status: Current Every Day Smoker    Packs/day: 1.00    Years: 54.00    Pack years: 54.00    Types: Cigarettes    Last attempt to quit: 07/11/2017    Years since quitting: 2.1  . Smokeless tobacco: Never Used  Vaping Use  . Vaping Use: Never used  Substance Use Topics  . Alcohol use: No    Alcohol/week: 0.0 standard drinks    Comment: rare  . Drug use: No    Home Medications Prior to Admission medications   Medication Sig Start Date End Date Taking? Authorizing Provider  aspirin 325 MG EC tablet Take 325 mg by mouth daily.    [provider]  atorvastatin (LIPITOR) 10 MG tablet Take 10 mg by mouth daily. 06/08/19   [provider]  FARXIGA 10 MG TABS tablet Take 10 mg by mouth daily. 07/07/19   [provider]  glipiZIDE (GLUCOTROL) 10 MG tablet Take 10 mg by mouth daily.     [provider]  hydrALAZINE (APRESOLINE) 25 MG tablet Take 1 tablet (25 mg total) by mouth every 6 (six) hours as needed (BP > 160/100). 10/19/17   Marrianne Mood D, PA-C  irbesartan (AVAPRO) 300 MG tablet Take 300 mg by mouth daily.    [provider]  isosorbide mononitrate (IMDUR) 30 MG 24 hr tablet Take 1 tablet (30 mg total) by mouth at bedtime. 05/04/18   Satira Sark, MD  metFORMIN (GLUCOPHAGE) 1000 MG tablet Take 1 tablet (1,000 mg total) by mouth 2 (two) times daily. 10/20/17   Marrianne Mood D, PA-C  metoprolol tartrate (LOPRESSOR) 100 MG tablet Take 1 tablet (100 mg total) by mouth 2 (two) times daily. 10/19/17   Marrianne Mood D, PA-C  nitroGLYCERIN (NITROSTAT) 0.4 MG SL tablet Place 1 tablet (0.4 mg total) under the tongue every 5 (five) minutes as needed for chest pain. 07/09/19   Satira Sark, MD  pioglitazone (ACTOS) 15 MG tablet Take 15 mg by mouth daily.  07/26/17    [provider]    Allergies    Statins  Review of Systems   Review of Systems  All other systems reviewed and are negative.   Physical Exam Updated Vital Signs BP (!) 158/83   Pulse 82   Temp 97.6 F (36.4 C) (Oral)   Resp 19   Ht 1.829 m (6')   Wt 81.2 kg   SpO2 100%   BMI 24.28 kg/m   Physical Exam Vitals and nursing note reviewed.  Constitutional:      General: He is not in acute distress.  Appearance: He is well-developed.  HENT:     Head: Normocephalic and atraumatic.     Mouth/Throat:     Pharynx: No oropharyngeal exudate.  Eyes:     General: No scleral icterus.       Right eye: No discharge.        Left eye: No discharge.     Conjunctiva/sclera: Conjunctivae normal.     Pupils: Pupils are equal, round, and reactive to light.  Neck:     Thyroid: No thyromegaly.     Vascular: No JVD.  Cardiovascular:     Rate and Rhythm: Normal rate and regular rhythm.     Heart sounds: Normal heart sounds. No murmur heard.  No friction rub. No gallop.   Pulmonary:     Effort: Pulmonary effort is normal. No respiratory distress.     Breath sounds: Normal breath sounds. No wheezing or rales.  Abdominal:     General: Bowel sounds are normal. There is no distension.     Palpations: Abdomen is soft. There is no mass.     Tenderness: There is no abdominal tenderness.  Musculoskeletal:        General: No tenderness. Normal range of motion.     Cervical back: Normal range of motion and neck supple.  Lymphadenopathy:     Cervical: No cervical adenopathy.  Skin:    General: Skin is warm and dry.     Findings: No erythema or rash.  Neurological:     Mental Status: He is alert.     Coordination: Coordination normal.     Comments: The patient is able to lift bilateral arms, there is normal finger-nose-finger, there is normal grips, there is no drift of the arms. Cranial nerves III through XII are normal. Normal speech, normal facial symmetry, peripheral visual  fields are normal.  Lower extremities have some weakness on the left compared to the right. He is having a difficult time getting the leg off the bed and holding it in the air. He also has a decrease sensation to light touch in the left leg compared to the right leg.  Psychiatric:        Behavior: Behavior normal.     ED Results / Procedures / Treatments   Labs (all labs ordered are listed, but only abnormal results are displayed) Labs Reviewed  COMPREHENSIVE METABOLIC PANEL - Abnormal; Notable for the following components:      Result Value   Glucose, Bld 243 (*)    BUN 34 (*)    Creatinine, Ser 2.00 (*)    GFR calc non Af Amer 32 (*)    GFR calc Af Amer 37 (*)    All other components within normal limits  CBG MONITORING, ED - Abnormal; Notable for the following components:   Glucose-Capillary 214 (*)    All other components within normal limits  ETHANOL  PROTIME-INR  APTT  CBC  DIFFERENTIAL  RAPID URINE DRUG SCREEN, HOSP PERFORMED  URINALYSIS, ROUTINE W REFLEX MICROSCOPIC  I-STAT CHEM 8, ED    EKG EKG Interpretation  Date/Time:  Saturday September 15 2019 21:21:42 EDT Ventricular Rate:  74 PR Interval:    QRS Duration: 90 QT Interval:  409 QTC Calculation: 454 R Axis:   22 Text Interpretation: Sinus rhythm since last tracing no significant change Confirmed by Noemi Chapel [phone]) on 09/15/2019 9:23:54 PM   Radiology CT HEAD CODE STROKE WO CONTRAST  Result Date: 09/15/2019 CLINICAL DATA:  Code stroke. New onset left-sided weakness beginning  1 hour ago. EXAM: CT HEAD WITHOUT CONTRAST TECHNIQUE: Contiguous axial images were obtained from the base of the skull through the vertex without intravenous contrast. COMPARISON:  CT head without contrast/6/19 FINDINGS: Brain: No acute infarct, hemorrhage, or mass lesion is present. Mild white matter changes are stable, and likely within normal limits for age. Basal ganglia are intact. Insular ribbon is normal bilaterally. The  ventricles are of normal size. No significant extraaxial fluid collection is present. The brainstem and cerebellum are within normal limits. Vascular: Atherosclerotic calcifications are present within the cavernous internal carotid arteries bilaterally. No hyperdense vessel is present. Skull: Calvarium is intact. No focal lytic or blastic lesions are present. No significant extracranial soft tissue lesion is present. Sinuses/Orbits: The paranasal sinuses and mastoid air cells are clear. The globes and orbits are within normal limits. ASPECTS [facility] Stroke Program Early CT Score) - Ganglionic level infarction (caudate, lentiform nuclei, internal capsule, insula, M1-M3 cortex): 7/7 - Supraganglionic infarction (M4-M6 cortex): 3/3 Total score (0-10 with 10 being normal): 10/10 IMPRESSION: 1. Stable mild white matter disease. 2. No acute intracranial abnormality or significant interval change. 3. ASPECTS is 10/10. These results were called by telephone at the time of interpretation on 09/15/2019 at 9:12 pm to provider [facility] , who verbally acknowledged these results. Electronically Signed   By: San Morelle M.D.   On: 09/15/2019 21:13    Procedures Procedures (including critical care time)  Medications Ordered in ED Medications  iohexol (OMNIPAQUE) 350 MG/ML injection 100 mL (100 mLs Intravenous Contrast Given 09/15/19 2115)    ED Course  I have reviewed the triage vital signs and the nursing notes.  Pertinent labs & imaging results that were available during my care of the patient were reviewed by me and considered in my medical decision making (see chart for details).    MDM Rules/Calculators/A&P                          This patient's exam is consistent with a diagnosis of a possible stroke. It could also be consistent with other etiologies. Code stroke was activated immediately on arrival. The patient was transported to the CT scan machine, labs will be drawn and the teleneurology  consultation will be obtained. Again his last seen normal was approximately 8:00 PM according to paramedics  This patient is getting better, he does comment on having some hematuria recently.  I have spoken with the teleneurology specialist, they recommended the patient not get TPA due to the comments above, he is improving significantly and currently has an NIH of 1.  He will need to be admitted for the rest of stroke work-up.  Paged hospitalist for admission D/w hostpiatlist who will admit  Final Clinical Impression(s) / ED Diagnoses Final diagnoses:  Acute ischemic stroke [facility])      Noemi Chapel, MD 09/15/19 2232

## 2019-09-15 NOTE — ED Triage Notes (Signed)
Pt arrives from home with EMS c/o LEFT sided weakness that started at [place]. EDP at bedside on arrival, pt cleared for CT.

## 2019-09-15 NOTE — ED Notes (Signed)
Per EMS, pt reported a severe H/A at onset of symptoms, and was diaphoretic and pale.

## 2019-09-16 ENCOUNTER — Inpatient Hospital Stay (HOSPITAL_COMMUNITY): Payer: Medicare Other

## 2019-09-16 ENCOUNTER — Encounter (HOSPITAL_COMMUNITY): Payer: Self-pay | Admitting: Family Medicine

## 2019-09-16 DIAGNOSIS — I10 Essential (primary) hypertension: Secondary | ICD-10-CM

## 2019-09-16 DIAGNOSIS — I25110 Atherosclerotic heart disease of native coronary artery with unstable angina pectoris: Secondary | ICD-10-CM

## 2019-09-16 DIAGNOSIS — N179 Acute kidney failure, unspecified: Secondary | ICD-10-CM | POA: Diagnosis present

## 2019-09-16 DIAGNOSIS — I639 Cerebral infarction, unspecified: Secondary | ICD-10-CM

## 2019-09-16 DIAGNOSIS — I6389 Other cerebral infarction: Secondary | ICD-10-CM

## 2019-09-16 DIAGNOSIS — R918 Other nonspecific abnormal finding of lung field: Secondary | ICD-10-CM | POA: Diagnosis present

## 2019-09-16 DIAGNOSIS — I4892 Unspecified atrial flutter: Secondary | ICD-10-CM

## 2019-09-16 DIAGNOSIS — R319 Hematuria, unspecified: Secondary | ICD-10-CM | POA: Diagnosis present

## 2019-09-16 LAB — GLUCOSE, CAPILLARY
Glucose-Capillary: 284 mg/dL — ABNORMAL HIGH (ref 70–99)
Glucose-Capillary: 288 mg/dL — ABNORMAL HIGH (ref 70–99)
Glucose-Capillary: 89 mg/dL (ref 70–99)
Glucose-Capillary: 292 mg/dL — ABNORMAL HIGH (ref 70–99)

## 2019-09-16 LAB — LIPID PANEL
Cholesterol: 157 mg/dL (ref 0–200)
HDL: 39 mg/dL — ABNORMAL LOW (ref >40)
LDL Cholesterol: 94 mg/dL (ref 0–99)
Total CHOL/HDL Ratio: 4 RATIO
Triglycerides: 121 mg/dL (ref <150)
VLDL: 24 mg/dL (ref 0–40)

## 2019-09-16 LAB — COMPREHENSIVE METABOLIC PANEL
ALT: 21 U/L (ref 0–44)
AST: 15 U/L (ref 15–41)
Albumin: 3.3 g/dL — ABNORMAL LOW (ref 3.5–5.0)
Alkaline Phosphatase: 83 U/L (ref 38–126)
Anion gap: 8 (ref 5–15)
BUN: 35 mg/dL — ABNORMAL HIGH (ref 8–23)
CO2: 26 mmol/L (ref 22–32)
Calcium: 8.7 mg/dL — ABNORMAL LOW (ref 8.9–10.3)
Chloride: 101 mmol/L (ref 98–111)
Creatinine, Ser: 1.73 mg/dL — ABNORMAL HIGH (ref 0.61–1.24)
GFR calc Af Amer: 44 mL/min — ABNORMAL LOW (ref >60)
GFR calc non Af Amer: 38 mL/min — ABNORMAL LOW (ref >60)
Glucose, Bld: 339 mg/dL — ABNORMAL HIGH (ref 70–99)
Potassium: 4 mmol/L (ref 3.5–5.1)
Sodium: 135 mmol/L (ref 135–145)
Total Bilirubin: 0.4 mg/dL (ref 0.3–1.2)
Total Protein: 6.1 g/dL — ABNORMAL LOW (ref 6.5–8.1)

## 2019-09-16 LAB — CBC
HCT: 36.7 % — ABNORMAL LOW (ref 39.0–52.0)
Hemoglobin: 12.1 g/dL — ABNORMAL LOW (ref 13.0–17.0)
MCH: 29.4 pg (ref 26.0–34.0)
MCHC: 33 g/dL (ref 30.0–36.0)
MCV: 89.1 fL (ref 80.0–100.0)
Platelets: 170 10*3/uL (ref 150–400)
RBC: 4.12 MIL/uL — ABNORMAL LOW (ref 4.22–5.81)
RDW: 14.6 % (ref 11.5–15.5)
WBC: 7.5 10*3/uL (ref 4.0–10.5)
nRBC: 0 % (ref 0.0–0.2)

## 2019-09-16 LAB — SARS CORONAVIRUS 2 BY RT PCR (HOSPITAL ORDER, PERFORMED IN ~~LOC~~ HOSPITAL LAB): SARS Coronavirus 2: NEGATIVE

## 2019-09-16 LAB — ECHOCARDIOGRAM COMPLETE
Area-P 1/2: 3.61 cm2
Height: 72 in
S' Lateral: 3.65 cm
Weight: 2864 oz

## 2019-09-16 LAB — HEMOGLOBIN A1C
Hgb A1c MFr Bld: 10.6 % — ABNORMAL HIGH (ref 4.8–5.6)
Mean Plasma Glucose: 257.52 mg/dL

## 2019-09-16 MED ORDER — IOHEXOL 300 MG/ML  SOLN
100.0000 mL | Freq: Once | INTRAMUSCULAR | Status: AC | PRN
  Administered 2019-09-16: 100 mL via INTRAVENOUS

## 2019-09-16 MED ORDER — ACETAMINOPHEN 325 MG PO TABS: 650.0000 mg | ORAL_TABLET | ORAL | Status: DC | PRN

## 2019-09-16 MED ORDER — ATORVASTATIN CALCIUM 10 MG PO TABS
10.0000 mg | ORAL_TABLET | Freq: Every day | ORAL | Status: DC
  Administered 2019-09-16: 10 mg via ORAL
  Filled 2019-09-16: qty 1

## 2019-09-16 MED ORDER — ACETAMINOPHEN 160 MG/5ML PO SOLN: 650.0000 mg | ORAL | Status: DC | PRN

## 2019-09-16 MED ORDER — SODIUM CHLORIDE 0.9 % IV SOLN: INTRAVENOUS | Status: AC

## 2019-09-16 MED ORDER — INSULIN ASPART 100 UNIT/ML ~~LOC~~ SOLN
0.0000 [IU] | Freq: Three times a day (TID) | SUBCUTANEOUS | Status: DC
  Administered 2019-09-16: 5 [IU] via SUBCUTANEOUS

## 2019-09-16 MED ORDER — INSULIN ASPART 100 UNIT/ML ~~LOC~~ SOLN
0.0000 [IU] | Freq: Every day | SUBCUTANEOUS | Status: DC
  Administered 2019-09-16: 3 [IU] via SUBCUTANEOUS

## 2019-09-16 MED ORDER — INSULIN ASPART 100 UNIT/ML ~~LOC~~ SOLN
0.0000 [IU] | Freq: Three times a day (TID) | SUBCUTANEOUS | Status: DC
  Administered 2019-09-16: 8 [IU] via SUBCUTANEOUS
  Administered 2019-09-17: 5 [IU] via SUBCUTANEOUS
  Administered 2019-09-17: 2 [IU] via SUBCUTANEOUS
  Administered 2019-09-17: 11 [IU] via SUBCUTANEOUS

## 2019-09-16 MED ORDER — ASPIRIN EC 325 MG PO TBEC
325.0000 mg | DELAYED_RELEASE_TABLET | Freq: Every day | ORAL | Status: DC
  Administered 2019-09-16: 325 mg via ORAL
  Filled 2019-09-16: qty 1

## 2019-09-16 MED ORDER — ACETAMINOPHEN 650 MG RE SUPP: 650.0000 mg | RECTAL | Status: DC | PRN

## 2019-09-16 MED ORDER — SENNOSIDES-DOCUSATE SODIUM 8.6-50 MG PO TABS: 1.0000 | ORAL_TABLET | Freq: Every evening | ORAL | Status: DC | PRN

## 2019-09-16 MED ORDER — STROKE: EARLY STAGES OF RECOVERY BOOK: Freq: Once | Status: AC

## 2019-09-16 MED ORDER — SODIUM CHLORIDE 0.45 % IV SOLN: INTRAVENOUS | Status: DC

## 2019-09-16 NOTE — ED Notes (Signed)
Pt given meal tray.

## 2019-09-16 NOTE — Progress Notes (Signed)
*  PRELIMINARY RESULTS* Echocardiogram 2D Echocardiogram has been performed.  [name] 09/16/2019, 11:20 AM

## 2019-09-16 NOTE — Plan of Care (Signed)
  Problem: Education: Goal: Knowledge of secondary prevention will improve 09/16/2019 0900 by Santa Lighter, RN Outcome: Progressing 09/16/2019 0858 by Santa Lighter, RN Outcome: Progressing Goal: Knowledge of patient specific risk factors addressed and post discharge goals established will improve 09/16/2019 0900 by Santa Lighter, RN Outcome: Progressing 09/16/2019 0858 by Santa Lighter, RN Outcome: Progressing   Problem: Coping: Goal: Will identify appropriate support needs 09/16/2019 0900 by Santa Lighter, RN Outcome: Progressing 09/16/2019 0858 by Santa Lighter, RN Outcome: Progressing   Problem: Self-Care: Goal: Verbalization of feelings and concerns over difficulty with self-care will improve 09/16/2019 0900 by Santa Lighter, RN Outcome: Progressing 09/16/2019 0858 by Santa Lighter, RN Outcome: Progressing Goal: Ability to communicate needs accurately will improve 09/16/2019 0900 by Santa Lighter, RN Outcome: Progressing 09/16/2019 0858 by Santa Lighter, RN Outcome: Progressing   Problem: Education: Goal: Knowledge of disease or condition will improve Outcome: Progressing Goal: Knowledge of secondary prevention will improve Outcome: Progressing Goal: Knowledge of patient specific risk factors addressed and post discharge goals established will improve Outcome: Progressing   Problem: Coping: Goal: Will verbalize positive feelings about self Outcome: Progressing Goal: Will identify appropriate support needs Outcome: Progressing   Problem: Health Behavior/Discharge Planning: Goal: Ability to manage health-related needs will improve Outcome: Progressing   Problem: Ischemic Stroke/TIA Tissue Perfusion: Goal: Complications of ischemic stroke/TIA will be minimized Outcome: Progressing   Problem: Education: Goal: Knowledge of General Education information will improve Description: Including pain rating scale, medication(s)/side effects and non-pharmacologic  comfort measures Outcome: Progressing   Problem: Health Behavior/Discharge Planning: Goal: Ability to manage health-related needs will improve Outcome: Progressing

## 2019-09-16 NOTE — Progress Notes (Signed)
PROGRESS NOTE    Richard [name]  TDD:[id number] DOB: 07/26/1945 DOA: 09/15/2019 PCP: Wannetta Sender, FNP    Brief Narrative:   Richard [name]  is a 74 y.o. male, with history of type 2 diabetes mellitus, hyperlipidemia, transient ischemic attack, hypertension, CAD, and questionable atrial flutter presents to the ED with a chief complaint of feeling badly.  Patient reports that between 7 and 8 PM he was getting ready to eat supper when he had sudden onset of headache and diaphoresis.  He felt like he was going to collapse.  He had generalized weakness, and wife could barely get him to his recliner chair.  Wife reports slurred, slow speech, and that he was panicky.  Patient reports dyspnea and epigastric abdominal pain secondary to anxiety.  Wife reports that he had noncoherent speech at this time as well.  Patient describes that his headache is located behind his left eye and wrapped around the back of his head to the left.  He describes the pain as sharp.  It was severe.  The headache is resolved now.  Patient reports that, although he was generally weak, he felt that he had more weakness on the left side. Patient feels as though he is back to baseline at this time. Patient is otherwise been in his normal state of health.  He does report that he has had hematuria, for which he is seeing his PCP.  Their is post to follow-up with urology.  Patient reports no dysuria.  Patient has had nephrolithiasis before. Patient is a current smoker.  He reports it relaxes him.  He enjoys it and does not plan to quit.  He has tried to quit before with Chantix, and patches without success.   Assessment & Plan:   Active Problems:   Essential hypertension   Type 2 diabetes mellitus with complication, with long-term current use of insulin (HCC)   Coronary artery disease involving native coronary artery of native heart with unstable angina pectoris (HCC)   TIA (transient ischemic attack)   Atrial flutter (HCC)  - s/p ablation   Hematuria   AKI (acute kidney injury) ([place])   Pulmonary nodules   1. Left-sided weakness with slurred speech, secondary to TIA versus CVA.  CT head did not show any acute infarct.  No large vessel occlusion.  MRI brain has been ordered.  He is chronically on aspirin.  He has difficulty tolerating statins due to myalgias, but has been started on low-dose Lipitor which appears to be tolerating.  Holding antihypertensives to allow permissive hypertension.  Echocardiogram ordered per stroke protocol.  Case reviewed with Dr. Lorrin Goodell with neurology at [facility] who will see the patient in consult 2. Hematuria.  Etiology is not entirely clear.  Will check CT of the abdomen and pelvis to rule out any underlying urothelial lesions.  Discussed with Dr. Clovis Riley with radiology and patient can receive reduced contrast dosing in order to better evaluate for any underlying masses.  Plans are for him to follow-up with urology as an outpatient.  May need inpatient evaluation if hematuria persist/hemoglobin begins to decline. 3. Diabetes.  A1c of 10.6.  He is chronically on glipizide and pioglitazone.  Oral agents currently on hold.  Continue on sliding scale insulin. 4. Atrial flutter status post ablation.  Heart rate is currently stable. 5. Pulmonary nodules.  He will need repeat CT chest in the next 2 to 3 months for surveillance. 6. Coronary artery disease.  No complaints of chest pain  at this time.  He was previously on aspirin and Plavix, but Plavix was discontinued since he not had any recent DES and dual antiplatelet therapy have led to easy bruising. 7. Acute kidney injury.  Unclear if he has any chronic kidney disease since prior labs are from 2019.  Will hold ARB.  Continue on IV fluids.  Monitor urine output. 8. Hypertension holding antihypertensives to allow for permissive hypertension.  Continue to follow.   DVT prophylaxis: SCD's Start: 09/16/19 0123  Code Status: Full code Family  Communication: Discussed with wife over the phone.  Also updated patient's daughter Disposition Plan: Status is: Inpatient  Remains inpatient appropriate because:Ongoing diagnostic testing needed not appropriate for outpatient work up   Dispo: The patient is from: Home              Anticipated d/c is to: Home              Anticipated d/c date is: 1 day              Patient currently is not medically stable to d/c.    Consultants:   Neurology  Procedures:     Antimicrobials:       Subjective: Feels a left-sided weakness has improved.  He does report hematuria which has been present for the last several days.  Describes it as gross, bloody urine  Objective: Vitals:   09/16/19 0921 09/16/19 1121 09/16/19 1321 09/16/19 1721  BP: (!) 150/82 (!) 148/80 (!) 146/80 (!) 177/89  Pulse: 80 81 80 89  Resp: 16 16 16 16   Temp: 98 F (36.7 C) 98.1 F (36.7 C) 98 F (36.7 C) 98 F (36.7 C)  TempSrc: Oral Oral Oral Oral  SpO2: 99% 99% 99% 98%  Weight:      Height:        Intake/Output Summary (Last 24 hours) at 09/16/2019 1904 Last data filed at 09/16/2019 1800 Gross per 24 hour  Intake 965.15 ml  Output --  Net 965.15 ml   Filed Weights   09/15/19 2115  Weight: 81.2 kg    Examination:  General exam: Appears calm and comfortable  Respiratory system: Clear to auscultation. Respiratory effort normal. Cardiovascular system: S1 & S2 heard, RRR. No JVD, murmurs, rubs, gallops or clicks. No pedal edema. Gastrointestinal system: Abdomen is nondistended, soft and nontender. No organomegaly or masses felt. Normal bowel sounds heard. Central nervous system: Alert and oriented. No focal neurological deficits. Extremities: Symmetric 5 x 5 power. Skin: No rashes, lesions or ulcers Psychiatry: Judgement and insight appear normal. Mood & affect appropriate.     Data Reviewed: I have personally reviewed following labs and imaging studies  CBC: Recent Labs  Lab 09/15/19 2057  09/16/19 0614  WBC 7.9 7.5  NEUTROABS 5.2  --   HGB 13.0 12.1*  HCT 40.3 36.7*  MCV 90.2 89.1  PLT 194 284   Basic Metabolic Panel: Recent Labs  Lab 09/15/19 2057 09/16/19 0614  NA 136 135  K 4.6 4.0  CL 100 101  CO2 26 26  GLUCOSE 243* 339*  BUN 34* 35*  CREATININE 2.00* 1.73*  CALCIUM 9.3 8.7*   GFR: Estimated Creatinine Clearance: 41.7 mL/min (A) (by C-G formula based on SCr of 1.73 mg/dL (H)). Liver Function Tests: Recent Labs  Lab 09/15/19 2057 09/16/19 0614  AST 21 15  ALT 25 21  ALKPHOS 91 83  BILITOT 0.5 0.4  PROT 6.9 6.1*  ALBUMIN 4.0 3.3*   No results for input(s):  LIPASE, AMYLASE in the last 168 hours. No results for input(s): AMMONIA in the last 168 hours. Coagulation Profile: Recent Labs  Lab 09/15/19 2057  INR 1.0   Cardiac Enzymes: No results for input(s): CKTOTAL, CKMB, CKMBINDEX, TROPONINI in the last 168 hours. BNP (last 3 results) No results for input(s): PROBNP in the last 8760 hours. HbA1C: Recent Labs    09/16/19 0614  HGBA1C 10.6*   CBG: Recent Labs  Lab 09/15/19 2130 09/16/19 0820 09/16/19 1137 09/16/19 1615  GLUCAP 214* 284* 288* 89   Lipid Profile: Recent Labs    09/16/19 0614  CHOL 157  HDL 39*  LDLCALC 94  TRIG 121  CHOLHDL 4.0   Thyroid Function Tests: No results for input(s): TSH, T4TOTAL, FREET4, T3FREE, THYROIDAB in the last 72 hours. Anemia Panel: No results for input(s): VITAMINB12, FOLATE, FERRITIN, TIBC, IRON, RETICCTPCT in the last 72 hours. Sepsis Labs: No results for input(s): PROCALCITON, LATICACIDVEN in the last 168 hours.  Recent Results (from the past 240 hour(s))  SARS Coronavirus 2 by RT PCR (hospital order, performed in [facility] hospital lab) Nasopharyngeal Nasopharyngeal Swab     Status: None   Collection Time: 09/15/19 11:10 PM   Specimen: Nasopharyngeal Swab  Result Value Ref Range Status   SARS Coronavirus 2 NEGATIVE NEGATIVE Final    Comment: (NOTE) SARS-CoV-2 target nucleic  acids are NOT DETECTED.  The SARS-CoV-2 RNA is generally detectable in upper and lower respiratory specimens during the acute phase of infection. The lowest concentration of SARS-CoV-2 viral copies this assay can detect is 250 copies / mL. A negative result does not preclude SARS-CoV-2 infection and should not be used as the sole basis for treatment or other patient management decisions.  A negative result may occur with improper specimen collection / handling, submission of specimen other than nasopharyngeal swab, presence of viral mutation(s) within the areas targeted by this assay, and inadequate number of viral copies (<250 copies / mL). A negative result must be combined with clinical observations, patient history, and epidemiological information.  Fact Sheet for Patients:   StrictlyIdeas.no  Fact Sheet for Healthcare Providers: BankingDealers.co.za  This test is not yet approved or  cleared by the [place] FDA and has been authorized for detection and/or diagnosis of SARS-CoV-2 by FDA under an Emergency Use Authorization (EUA).  This EUA will remain in effect (meaning this test can be used) for the duration of the COVID-19 declaration under Section 564(b)(1) of the Act, 21 U.S.C. section 360bbb-3(b)(1), unless the authorization is terminated or revoked sooner.  Performed at [facility], [address], [place], [place] [address]          Radiology Studies: CT ABDOMEN PELVIS W WO CONTRAST  Result Date: 09/16/2019 CLINICAL DATA:  Hematuria EXAM: CT ABDOMEN AND PELVIS WITHOUT AND WITH CONTRAST TECHNIQUE: Multidetector CT imaging of the abdomen and pelvis was performed following the standard protocol before and following the bolus administration of intravenous contrast. CONTRAST:  80 mL OMNIPAQUE IOHEXOL 300 MG/ML  SOLN COMPARISON:  CT chest and abdomen angiogram, 11/25/2005 FINDINGS: Lower chest: No acute abnormality.  Hepatobiliary: No solid liver abnormality is seen. No gallstones, gallbladder wall thickening, or biliary dilatation. Pancreas: Unremarkable. No pancreatic ductal dilatation or surrounding inflammatory changes. Spleen: Normal in size without significant abnormality. Adrenals/Urinary Tract: Adrenal glands are unremarkable. There is excreted contrast in the urinary tract, in keeping with contrast enhanced examination performed approximately 12 hours prior. Perinephric stranding and fluid. There is a contrast enhancing endoluminal mass of  the left aspect of the anterior bladder dome measuring approximately 2.7 x 2.3 x 1.8 cm (series 2, image 64, series 5, image 51). Stomach/Bowel: Stomach is within normal limits. Appendix appears normal. No evidence of bowel wall thickening, distention, or inflammatory changes. Colonic diverticulosis. Vascular/Lymphatic: Aortic atherosclerosis. Prominent subcentimeter retroperitoneal lymph nodes unchanged compared to remote prior examination dated 2007. Reproductive: No mass or other significant abnormality. Other: No abdominal wall hernia or abnormality. No abdominopelvic ascites. Musculoskeletal: No acute or significant osseous findings. IMPRESSION: 1. 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 2. No evidence of lymphadenopathy or metastatic disease in the abdomen or pelvis. Prominent subcentimeter retroperitoneal lymph nodes unchanged compared to remote prior examination dated 2007. 3. Aortic Atherosclerosis (ICD10-I70.0). Electronically Signed   By: Eddie Candle M.D.   On: 09/16/2019 14:58   CT Angio Head W or Wo Contrast  Result Date: 09/15/2019 CLINICAL DATA:  New onset left-sided weakness. EXAM: CT ANGIOGRAPHY HEAD AND NECK TECHNIQUE: Multidetector CT imaging of the head and neck was performed using the standard protocol during bolus  administration of intravenous contrast. Multiplanar CT image reconstructions and MIPs were obtained to evaluate the vascular anatomy. Carotid stenosis measurements (when applicable) are obtained utilizing NASCET criteria, using the distal internal carotid diameter as the denominator. CONTRAST:  138mL OMNIPAQUE IOHEXOL 350 MG/ML SOLN COMPARISON:  CT head without contrast 09/15/2019. CTA head and neck 06/27/2017 FINDINGS: CTA NECK FINDINGS Aortic arch: Dense atherosclerotic calcifications are again seen at the aortic arch and origins the great vessels without significant stenosis. No significant interval change is present. Right carotid system: The right common carotid artery is within normal limits. Bifurcation is unremarkable. Cervical right ICA is normal. Left carotid system: Left common carotid artery is somewhat irregular without focal stenosis. Minimal calcifications present bifurcation without significant stenosis. Cervical left ICA is normal. Vertebral arteries: The right vertebral artery is the dominant vessel. The left vertebral artery is hypoplastic. Segmental stenoses are present in the left vertebral artery in the neck. Moderate stenosis is present at C2, C4, and C6-7. Skeleton: Cervical spine is fused C2-6. Slight adjacent level anterolisthesis at C7-T1 is stable. Other neck: Soft tissues of the neck are otherwise unremarkable. Upper chest: Centrilobular emphysematous changes are present. Paraseptal emphysematous changes are present as well. Bilateral ill-defined pulmonary nodules measure up to 5.5 mm on the right and 5.8 cm on the left. Thoracic inlet is within normal limits. Review of the MIP images confirms the above findings CTA HEAD FINDINGS Anterior circulation: Atherosclerotic calcifications are present within the cavernous internal carotid arteries bilaterally. Mild pre cavernous left ICA narrowing is present measuring 60% relative to the more distal vessel. ICA termini are within normal limits.  The A1 and M1 segments are normal. Anterior communicating artery is patent. MCA bifurcations are intact. Early bifurcation is present on the right, a normal variant. ACA and MCA branch vessels are within normal limits. Posterior circulation: High-grade stenosis is present in the hypoplastic left vertebral artery at the dural margin. The right vertebral artery is within normal limits. Vertebrobasilar junction is normal. Mild narrowing is present in the proximal basilar artery. Basilar artery is otherwise within normal limits. Both posterior cerebral arteries originate from the basilar tip. Mild narrowing is present in the proximal left P2 segment. Branch vessels are within normal limits bilaterally. Venous sinuses: Dural sinuses are patent. The straight sinus and deep cerebral veins are intact. Cortical  veins are unremarkable. Anatomic variants: None Review of the MIP images confirms the above findings IMPRESSION: 1. No emergent large vessel occlusion. 2. 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 Multiple segmental stenoses are present in the left vertebral artery throughout the neck. 3. Mild narrowing of the proximal basilar artery and proximal left P2 segment. 4. Mild narrowing of the pre cavernous left ICA measuring 60% relative to the more distal vessel. 5. No significant stenosis in the neck. 6. Multiple bilateral pulmonary nodules measuring up to 5 mm. These are likely inflammatory. Recommend CT of the chest without contrast for follow-up in 2-3 months. 7. Aortic Atherosclerosis (ICD10-I70.0) and Emphysema (ICD10-J43.9). These results were called by telephone at the time of interpretation on 09/15/2019 at 9:41 pm to provider [facility] , who verbally acknowledged these results. Electronically Signed   By: San Morelle M.D.   On: 09/15/2019 21:43   CT Angio Neck W and/or Wo Contrast  Result Date: 09/15/2019 CLINICAL DATA:  New onset left-sided weakness. EXAM:  CT ANGIOGRAPHY HEAD AND NECK TECHNIQUE: Multidetector CT imaging of the head and neck was performed using the standard protocol during bolus administration of intravenous contrast. Multiplanar CT image reconstructions and MIPs were obtained to evaluate the vascular anatomy. Carotid stenosis measurements (when applicable) are obtained utilizing NASCET criteria, using the distal internal carotid diameter as the denominator. CONTRAST:  119mL OMNIPAQUE IOHEXOL 350 MG/ML SOLN COMPARISON:  CT head without contrast 09/15/2019. CTA head and neck 06/27/2017 FINDINGS: CTA NECK FINDINGS Aortic arch: Dense atherosclerotic calcifications are again seen at the aortic arch and origins the great vessels without significant stenosis. No significant interval change is present. Right carotid system: The right common carotid artery is within normal limits. Bifurcation is unremarkable. Cervical right ICA is normal. Left carotid system: Left common carotid artery is somewhat irregular without focal stenosis. Minimal calcifications present bifurcation without significant stenosis. Cervical left ICA is normal. Vertebral arteries: The right vertebral artery is the dominant vessel. The left vertebral artery is hypoplastic. Segmental stenoses are present in the left vertebral artery in the neck. Moderate stenosis is present at C2, C4, and C6-7. Skeleton: Cervical spine is fused C2-6. Slight adjacent level anterolisthesis at C7-T1 is stable. Other neck: Soft tissues of the neck are otherwise unremarkable. Upper chest: Centrilobular emphysematous changes are present. Paraseptal emphysematous changes are present as well. Bilateral ill-defined pulmonary nodules measure up to 5.5 mm on the right and 5.8 cm on the left. Thoracic inlet is within normal limits. Review of the MIP images confirms the above findings CTA HEAD FINDINGS Anterior circulation: Atherosclerotic calcifications are present within the cavernous internal carotid arteries  bilaterally. Mild pre cavernous left ICA narrowing is present measuring 60% relative to the more distal vessel. ICA termini are within normal limits. The A1 and M1 segments are normal. Anterior communicating artery is patent. MCA bifurcations are intact. Early bifurcation is present on the right, a normal variant. ACA and MCA branch vessels are within normal limits. Posterior circulation: High-grade stenosis is present in the hypoplastic left vertebral artery at the dural margin. The right vertebral artery is within normal limits. Vertebrobasilar junction is normal. Mild narrowing is present in the proximal basilar artery. Basilar artery is otherwise within normal limits. Both posterior cerebral arteries originate from the basilar tip. Mild narrowing is present in the proximal left P2 segment. Branch vessels are within normal limits bilaterally. Venous sinuses: Dural sinuses are patent. The straight sinus and deep cerebral veins are intact.  Cortical veins are unremarkable. Anatomic variants: None Review of the MIP images confirms the above findings IMPRESSION: 1. No emergent large vessel occlusion. 2. 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 Multiple segmental stenoses are present in the left vertebral artery throughout the neck. 3. Mild narrowing of the proximal basilar artery and proximal left P2 segment. 4. Mild narrowing of the pre cavernous left ICA measuring 60% relative to the more distal vessel. 5. No significant stenosis in the neck. 6. Multiple bilateral pulmonary nodules measuring up to 5 mm. These are likely inflammatory. Recommend CT of the chest without contrast for follow-up in 2-3 months. 7. Aortic Atherosclerosis (ICD10-I70.0) and Emphysema (ICD10-J43.9). These results were called by telephone at the time of interpretation on 09/15/2019 at 9:41 pm to provider [facility] , who verbally acknowledged these results. Electronically Signed   By: San Morelle M.D.   On: 09/15/2019 21:43   ECHOCARDIOGRAM COMPLETE  Result Date: 09/16/2019    ECHOCARDIOGRAM REPORT   Patient Name:   REGINOLD BEALE Date of Exam: 09/16/2019 Medical Rec #:  [id number]      Height:       72.0 in Accession #:    [id number]     Weight:       179.0 lb Date of Birth:  04-Aug-1945      BSA:          2.032 m Patient Age:    72 years       BP:           154/89 mmHg Patient Gender: M              HR:           77 bpm. Exam Location:  [facility] Procedure: 2D Echo Indications:    TIA 435.9 / G45.9  History:        Patient has no prior history of Echocardiogram examinations,                 most recent 06/28/2017. CAD, TIA, Arrythmias:Atrial Flutter,                 Signs/Symptoms:Chest Pain; Risk Factors:Hypertension, Diabetes,                 Dyslipidemia and Current Smoker.  Sonographer:    [name] RDCS (AE) Referring Phys: [id number] ASIA B [place]  1. Left ventricular ejection fraction, by estimation, is 60 to 65%. The left ventricle has normal function. The left ventricle has no regional wall motion abnormalities. There is mild left ventricular hypertrophy. Left ventricular diastolic parameters are consistent with Grade I diastolic dysfunction (impaired relaxation). Elevated left ventricular end-diastolic pressure.  2. Right ventricular systolic function is normal. The right ventricular size is normal. There is normal pulmonary artery systolic pressure.  3. The mitral valve is grossly normal. Trivial mitral valve regurgitation.  4. The aortic valve is tricuspid. Aortic valve regurgitation is not visualized. Mild aortic valve sclerosis is present, with no evidence of aortic valve stenosis.  5. The inferior vena cava is normal in size with greater than 50% respiratory variability, suggesting right atrial pressure of 3 mmHg. FINDINGS  Left Ventricle: Left ventricular ejection fraction, by estimation, is 60 to 65%. The left ventricle has normal function. The left ventricle  has no regional wall motion abnormalities. The left ventricular internal cavity size was normal in size. There is  mild left ventricular hypertrophy. Left ventricular diastolic parameters are consistent  with Grade I diastolic dysfunction (impaired relaxation). Elevated left ventricular end-diastolic pressure. Right Ventricle: The right ventricular size is normal. No increase in right ventricular wall thickness. Right ventricular systolic function is normal. There is normal pulmonary artery systolic pressure. The tricuspid regurgitant velocity is 1.75 m/s, and  with an assumed right atrial pressure of 3 mmHg, the estimated right ventricular systolic pressure is 09.7 mmHg. Left Atrium: Left atrial size was normal in size. Right Atrium: Right atrial size was normal in size. Pericardium: There is no evidence of pericardial effusion. Mitral Valve: The mitral valve is grossly normal. Trivial mitral valve regurgitation. Tricuspid Valve: The tricuspid valve is grossly normal. Tricuspid valve regurgitation is trivial. Aortic Valve: The aortic valve is tricuspid. Aortic valve regurgitation is not visualized. Mild aortic valve sclerosis is present, with no evidence of aortic valve stenosis. Pulmonic Valve: The pulmonic valve was normal in structure. Pulmonic valve regurgitation is not visualized. Aorta: The aortic root and ascending aorta are structurally normal, with no evidence of dilitation. Venous: The inferior vena cava is normal in size with greater than 50% respiratory variability, suggesting right atrial pressure of 3 mmHg. IAS/Shunts: The interatrial septum was not well visualized.  LEFT VENTRICLE PLAX 2D LVIDd:         4.71 cm  Diastology LVIDs:         3.65 cm  LV e' lateral:   5.98 cm/s LV PW:         1.37 cm  LV E/e' lateral: 17.4 LV IVS:        1.18 cm  LV e' medial:    5.98 cm/s LVOT diam:     2.00 cm  LV E/e' medial:  17.4 LVOT Area:     3.14 cm  RIGHT VENTRICLE RV S prime:     18.80 cm/s TAPSE (M-mode): 3.3  cm LEFT ATRIUM             Index       RIGHT ATRIUM           Index LA diam:        3.60 cm 1.77 cm/m  RA Area:     17.30 cm LA Vol (A2C):   49.0 ml 24.11 ml/m RA Volume:   50.20 ml  24.70 ml/m LA Vol (A4C):   36.1 ml 17.76 ml/m LA Biplane Vol: 46.0 ml 22.63 ml/m   AORTA Ao Root diam: 3.10 cm MITRAL VALVE                TRICUSPID VALVE MV Area (PHT): 3.61 cm     TR Peak grad:   12.2 mmHg MV Decel Time: 210 msec     TR Vmax:        175.00 cm/s MV E velocity: 104.00 cm/s MV A velocity: 102.00 cm/s  SHUNTS MV E/A ratio:  1.02         Systemic Diam: 2.00 cm Lyman Bishop MD Electronically signed by Lyman Bishop MD Signature Date/Time: 09/16/2019/1:31:31 PM    Final    CT HEAD CODE STROKE WO CONTRAST  Result Date: 09/15/2019 CLINICAL DATA:  Code stroke. New onset left-sided weakness beginning 1 hour ago. EXAM: CT HEAD WITHOUT CONTRAST TECHNIQUE: Contiguous axial images were obtained from the base of the skull through the vertex without intravenous contrast. COMPARISON:  CT head without contrast/6/19 FINDINGS: Brain: No acute infarct, hemorrhage, or mass lesion is present. Mild white matter changes are stable, and likely within normal limits for age. Basal ganglia are intact. Insular  ribbon is normal bilaterally. The ventricles are of normal size. No significant extraaxial fluid collection is present. The brainstem and cerebellum are within normal limits. Vascular: Atherosclerotic calcifications are present within the cavernous internal carotid arteries bilaterally. No hyperdense vessel is present. Skull: Calvarium is intact. No focal lytic or blastic lesions are present. No significant extracranial soft tissue lesion is present. Sinuses/Orbits: The paranasal sinuses and mastoid air cells are clear. The globes and orbits are within normal limits. ASPECTS [facility] Stroke Program Early CT Score) - Ganglionic level infarction (caudate, lentiform nuclei, internal capsule, insula, M1-M3 cortex): 7/7 -  Supraganglionic infarction (M4-M6 cortex): 3/3 Total score (0-10 with 10 being normal): 10/10 IMPRESSION: 1. Stable mild white matter disease. 2. No acute intracranial abnormality or significant interval change. 3. ASPECTS is 10/10. These results were called by telephone at the time of interpretation on 09/15/2019 at 9:12 pm to provider [facility] , who verbally acknowledged these results. Electronically Signed   By: San Morelle M.D.   On: 09/15/2019 21:13        Scheduled Meds: . aspirin  325 mg Oral Daily  . atorvastatin  10 mg Oral Daily  . insulin aspart  0-15 Units Subcutaneous TID WC  . insulin aspart  0-5 Units Subcutaneous QHS   Continuous Infusions: . sodium chloride 100 mL/hr at 09/16/19 1800     LOS: 1 day    Time spent: 50mins    Kathie Dike, MD [facility]   If 7PM-7AM, please contact night-coverage [email or web address]  09/16/2019, 7:04 PM

## 2019-09-16 NOTE — Therapy (Signed)
Per chart review patient is back to baseline. No immediate therapy needs noted at this time. Will assess Monday on caseload if indicated.   9:04 AM, 09/16/19 Jerene Pitch, DPT Physical Therapy with [facility]  [phone] office

## 2019-09-16 NOTE — Plan of Care (Signed)
  Problem: Education: Goal: Knowledge of secondary prevention will improve Outcome: Progressing Goal: Knowledge of patient specific risk factors addressed and post discharge goals established will improve Outcome: Progressing   Problem: Coping: Goal: Will identify appropriate support needs Outcome: Progressing   Problem: Self-Care: Goal: Verbalization of feelings and concerns over difficulty with self-care will improve Outcome: Progressing Goal: Ability to communicate needs accurately will improve Outcome: Progressing   Problem: Education: Goal: Knowledge of General Education information will improve Description: Including pain rating scale, medication(s)/side effects and non-pharmacologic comfort measures Outcome: Progressing   Problem: Health Behavior/Discharge Planning: Goal: Ability to manage health-related needs will improve Outcome: Progressing

## 2019-09-16 NOTE — Progress Notes (Signed)
Called report to Zacarias Pontes to [facility] [employer] on [address]

## 2019-09-17 ENCOUNTER — Telehealth: Payer: Self-pay

## 2019-09-17 DIAGNOSIS — R310 Gross hematuria: Secondary | ICD-10-CM

## 2019-09-17 DIAGNOSIS — R55 Syncope and collapse: Secondary | ICD-10-CM

## 2019-09-17 DIAGNOSIS — R319 Hematuria, unspecified: Secondary | ICD-10-CM

## 2019-09-17 DIAGNOSIS — R918 Other nonspecific abnormal finding of lung field: Secondary | ICD-10-CM

## 2019-09-17 LAB — GLUCOSE, CAPILLARY
Glucose-Capillary: 148 mg/dL — ABNORMAL HIGH (ref 70–99)
Glucose-Capillary: 220 mg/dL — ABNORMAL HIGH (ref 70–99)
Glucose-Capillary: 336 mg/dL — ABNORMAL HIGH (ref 70–99)

## 2019-09-17 LAB — HEMOGLOBIN AND HEMATOCRIT, BLOOD
HCT: 40.1 % (ref 39.0–52.0)
Hemoglobin: 13 g/dL (ref 13.0–17.0)

## 2019-09-17 LAB — BASIC METABOLIC PANEL
Anion gap: 8 (ref 5–15)
BUN: 17 mg/dL (ref 8–23)
CO2: 27 mmol/L (ref 22–32)
Calcium: 8.8 mg/dL — ABNORMAL LOW (ref 8.9–10.3)
Chloride: 105 mmol/L (ref 98–111)
Creatinine, Ser: 1.23 mg/dL (ref 0.61–1.24)
GFR calc Af Amer: >60 mL/min (ref >60)
GFR calc non Af Amer: 58 mL/min — ABNORMAL LOW (ref >60)
Glucose, Bld: 276 mg/dL — ABNORMAL HIGH (ref 70–99)
Potassium: 4.3 mmol/L (ref 3.5–5.1)
Sodium: 140 mmol/L (ref 135–145)

## 2019-09-17 LAB — MAGNESIUM: Magnesium: 1.5 mg/dL — ABNORMAL LOW (ref 1.7–2.4)

## 2019-09-17 MED ORDER — ATORVASTATIN CALCIUM 40 MG PO TABS
40.0000 mg | ORAL_TABLET | Freq: Every day | ORAL | Status: DC
  Administered 2019-09-17: 40 mg via ORAL
  Filled 2019-09-17: qty 4

## 2019-09-17 MED ORDER — INSULIN STARTER KIT- PEN NEEDLES (ENGLISH)
1.0000 | Freq: Once | Status: AC
  Administered 2019-09-17: 1
  Filled 2019-09-17: qty 1

## 2019-09-17 MED ORDER — CLOPIDOGREL BISULFATE 75 MG PO TABS
75.0000 mg | ORAL_TABLET | Freq: Every day | ORAL | Status: DC
  Filled 2019-09-17: qty 1

## 2019-09-17 MED ORDER — ASPIRIN EC 325 MG PO TBEC: 325.0000 mg | DELAYED_RELEASE_TABLET | Freq: Every day | ORAL | Status: DC

## 2019-09-17 MED ORDER — MAGNESIUM SULFATE 2 GM/50ML IV SOLN
2.0000 g | Freq: Once | INTRAVENOUS | Status: AC
  Administered 2019-09-17: 2 g via INTRAVENOUS
  Filled 2019-09-17: qty 50

## 2019-09-17 MED ORDER — ASPIRIN EC 81 MG PO TBEC
81.0000 mg | DELAYED_RELEASE_TABLET | Freq: Every day | ORAL | Status: DC
  Administered 2019-09-17: 81 mg via ORAL
  Filled 2019-09-17: qty 1

## 2019-09-17 MED ORDER — SODIUM CHLORIDE 0.9 % IV SOLN: INTRAVENOUS | Status: DC

## 2019-09-17 MED ORDER — LIVING WELL WITH DIABETES BOOK
Freq: Once | Status: AC
  Filled 2019-09-17: qty 1

## 2019-09-17 NOTE — Telephone Encounter (Signed)
-----   Message from Irine Seal, MD sent at 09/17/2019  2:46 PM EDT ----- This fellow is currently at [facility] after transfer from [facility].  He needs to be seen by one of [place] in the next couple of weeks for a bladder mass with hematuria.

## 2019-09-17 NOTE — Discharge Summary (Signed)
Physician Discharge Summary  BORDEN THUNE HYW:[id number] DOB: September 18, 1945 DOA: 09/15/2019  PCP: Wannetta Sender, FNP  Admit date: 09/15/2019 Discharge date: 09/17/2019  Time spent: 45 minutes  Recommendations for Outpatient Follow-up:  Patient will be discharged to home.  Patient will need to follow up with primary care provider within one week of discharge, repeat CBC and BMP, magnesium.  Follow up with cardiology, neurology, urology.  Patient should continue medications as prescribed.  Patient should follow a heart healthy/carb modified diet.   Discharge Diagnoses:  Left sided weakness with slurred speech, suspect TIA  Hematuria Acute kidney injury Hypomagnesemia Diabetes mellitus, type II, uncontrolled with hyperglycemia Atrial flutter status post ablation Pulmonary nodules Coronary artery disease Essential hypertension  Discharge Condition: Stable  Diet recommendation: heart healthy/carb modified  Filed Weights   09/15/19 2115 09/16/19 2119  Weight: 81.2 kg 86.4 kg    History of present illness:  On 09/15/2019 by Dr. Carlynn Purl Demarrius Guerrero  is a 74 y.o. male, with history of type 2 diabetes mellitus, hyperlipidemia, transient ischemic attack, hypertension, CAD, and questionable atrial flutter presents to the ED with a chief complaint of feeling badly.  Patient reports that between 7 and 8 PM he was getting ready to eat supper when he had sudden onset of headache and diaphoresis.  He felt like he was going to collapse.  He had generalized weakness, and wife could barely get him to his recliner chair.  Wife reports slurred, slow speech, and that he was panicky.  Patient reports dyspnea and epigastric abdominal pain secondary to anxiety.  Wife reports that he had noncoherent speech at this time as well.  Patient describes that his headache is located behind his left eye and wrapped around the back of his head to the left.  He describes the pain as sharp.  It was  severe.  The headache is resolved now.  Patient reports that, although he was generally weak, he felt that he had more weakness on the left side. Patient feels as though he is back to baseline at this time. Patient is otherwise been in his normal state of health.  He does report that he has had hematuria, for which he is seeing his PCP.  Their is post to follow-up with urology.  Patient reports no dysuria.  Patient has had nephrolithiasis before. Patient is a current smoker.  He reports it relaxes him.  He enjoys it and does not plan to quit.  He has tried to quit before with Chantix, and patches without success.  Hospital Course:  Left sided weakness with slurred speech, suspect TIA  -Symptoms resolved -CT head: no acute intracranial abnormality -MRI brain: No acute intercranial abnormality -MRA was normal intracranial MRA -Echocardiogram 5%, grade 1 diastolic dysfunction, mild aortic valve sclerosis -LDL 94, hemoglobin A1c 10.6 -PT, OT, speech recommended no further follow-up -Neurology consulted and appreciated discussed with Dr. Erlinda Hong, no further inpatient work-up.  Would recommend continuing aspirin if okay with urology. 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 Jeffie Pollock, recommended outpatient follow-up with cystoscopy, and hold aspirin -Hemoglobin is remained stable, currently 13 -Repeat CBC in 1 week  Acute kidney injury -Likely secondary to dehydration -Creatinine was 2 on admission -Placed on IV fluids -Creatinine down to 1.23 today -Repeat BMP in 1 week  Hypomagnesemia -Magnesium 1.5, received replaced -Repeat in 1  week  Diabetes mellitus, type II, uncontrolled with hyperglycemia -hemoglobin A1c 10.6 -Currently on glipizide and pioglitazone  Atrial flutter status post  ablation -Patient follows with [name], cardiology  Pulmonary nodules -Repeat CT chest in 2 to 3 months for surveillance  Coronary artery disease -Currently no complaints of chest pain -Patient is previously on aspirin and Plavix however Plavix was discontinued  Essential hypertension -Continue home medications on discharge  Procedures: Echocardiogram  Consultations: Neurology Urology, via phone, Dr. Jeffie Pollock  Discharge Exam: Vitals:   09/17/19 1227 09/17/19 1528  BP: (!) 180/92 (!) 170/89  Pulse: 80 88  Resp: 18 18  Temp: 97.6 F (36.4 C) 98.2 F (36.8 C)  SpO2: 98% 97%     General: Well developed, well nourished, NAD, appears stated age  HEENT: NCAT, mucous membranes moist.  Cardiovascular: S1 S2 auscultated, RRR  Respiratory: Clear to auscultation bilaterally  Abdomen: Soft, nontender, nondistended, + bowel sounds  Extremities: warm dry without cyanosis clubbing or edema  Neuro: AAOx3, nonfocal  Psych: Normal affect and demeanor with intact judgement and insight  Discharge Instructions Discharge Instructions    Discharge instructions   Complete by: As directed    Patient will be discharged to home.  Patient will need to follow up with primary care provider within one week of discharge, repeat CBC and BMP, magnesium.  Follow up with cardiology, neurology, urology.  Patient should continue medications as prescribed.  Patient should follow a heart healthy/carb modified diet.     Allergies as of 09/17/2019      Reactions   Statins    Leg pain      Medication List    STOP taking these medications   aspirin 325 MG EC tablet     TAKE these medications   atorvastatin 10 MG tablet Commonly known as: LIPITOR Take 10 mg by mouth daily.   glipiZIDE 10 MG 24 hr tablet Commonly known as: GLUCOTROL XL Take 10 mg by mouth 2 (two) times daily.   irbesartan 300 MG tablet Commonly known as: AVAPRO Take 300 mg by mouth daily.   isosorbide mononitrate  30 MG 24 hr tablet Commonly known as: IMDUR Take 1 tablet (30 mg total) by mouth at bedtime.   metoprolol tartrate 100 MG tablet Commonly known as: LOPRESSOR Take 1 tablet (100 mg total) by mouth 2 (two) times daily.   nitroGLYCERIN 0.4 MG SL tablet Commonly known as: NITROSTAT Place 1 tablet (0.4 mg total) under the tongue every 5 (five) minutes as needed for chest pain.   pioglitazone 30 MG tablet Commonly known as: ACTOS Take 30 mg by mouth daily.      Allergies  Allergen Reactions  . Statins     Leg pain    Follow-up Information    Wannetta Sender, FNP. Schedule an appointment as soon as possible for a visit in 1 week(s).   Specialty: Family Medicine Why: Hospital follow up Contact information: [employer] [address] [place] [address] [phone]        Satira Sark, MD .   Specialty: Cardiology Contact information: [place] [address] [phone]        Irine Seal, MD. Schedule an appointment as soon as possible for a visit.   Specialty: Urology Why: Office will contact you for an appointment Contact information: [address] [place] [address] [phone]  The results of significant diagnostics from this hospitalization (including imaging, microbiology, ancillary and laboratory) are listed below for reference.    Significant Diagnostic Studies: CT ABDOMEN PELVIS W WO CONTRAST  Result Date: 09/16/2019 CLINICAL DATA:  Hematuria EXAM: CT ABDOMEN AND PELVIS WITHOUT AND WITH CONTRAST TECHNIQUE: Multidetector CT imaging of the abdomen and pelvis was performed following the standard protocol before and following the bolus administration of intravenous contrast. CONTRAST:  80 mL OMNIPAQUE IOHEXOL 300 MG/ML  SOLN COMPARISON:  CT chest and abdomen angiogram, 11/25/2005 FINDINGS: Lower chest: No acute abnormality. Hepatobiliary: No solid liver  abnormality is seen. No gallstones, gallbladder wall thickening, or biliary dilatation. Pancreas: Unremarkable. No pancreatic ductal dilatation or surrounding inflammatory changes. Spleen: Normal in size without significant abnormality. Adrenals/Urinary Tract: Adrenal glands are unremarkable. There is excreted contrast in the urinary tract, in keeping with contrast enhanced examination performed approximately 12 hours prior. Perinephric stranding and fluid. There is a contrast enhancing endoluminal mass of the left aspect of the anterior bladder dome measuring approximately 2.7 x 2.3 x 1.8 cm (series 2, image 64, series 5, image 51). Stomach/Bowel: Stomach is within normal limits. Appendix appears normal. No evidence of bowel wall thickening, distention, or inflammatory changes. Colonic diverticulosis. Vascular/Lymphatic: Aortic atherosclerosis. Prominent subcentimeter retroperitoneal lymph nodes unchanged compared to remote prior examination dated 2007. Reproductive: No mass or other significant abnormality. Other: No abdominal wall hernia or abnormality. No abdominopelvic ascites. Musculoskeletal: No acute or significant osseous findings. IMPRESSION: 1. 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 2. No evidence of lymphadenopathy or metastatic disease in the abdomen or pelvis. Prominent subcentimeter retroperitoneal lymph nodes unchanged compared to remote prior examination dated 2007. 3. Aortic Atherosclerosis (ICD10-I70.0). Electronically Signed   By: Eddie Candle M.D.   On: 09/16/2019 14:58   CT Angio Head W or Wo Contrast  Result Date: 09/15/2019 CLINICAL DATA:  New onset left-sided weakness. EXAM: CT ANGIOGRAPHY HEAD AND NECK TECHNIQUE: Multidetector CT imaging of the head and neck was performed using the standard protocol during bolus administration of intravenous contrast.  Multiplanar CT image reconstructions and MIPs were obtained to evaluate the vascular anatomy. Carotid stenosis measurements (when applicable) are obtained utilizing NASCET criteria, using the distal internal carotid diameter as the denominator. CONTRAST:  176mL OMNIPAQUE IOHEXOL 350 MG/ML SOLN COMPARISON:  CT head without contrast 09/15/2019. CTA head and neck 06/27/2017 FINDINGS: CTA NECK FINDINGS Aortic arch: Dense atherosclerotic calcifications are again seen at the aortic arch and origins the great vessels without significant stenosis. No significant interval change is present. Right carotid system: The right common carotid artery is within normal limits. Bifurcation is unremarkable. Cervical right ICA is normal. Left carotid system: Left common carotid artery is somewhat irregular without focal stenosis. Minimal calcifications present bifurcation without significant stenosis. Cervical left ICA is normal. Vertebral arteries: The right vertebral artery is the dominant vessel. The left vertebral artery is hypoplastic. Segmental stenoses are present in the left vertebral artery in the neck. Moderate stenosis is present at C2, C4, and C6-7. Skeleton: Cervical spine is fused C2-6. Slight adjacent level anterolisthesis at C7-T1 is stable. Other neck: Soft tissues of the neck are otherwise unremarkable. Upper chest: Centrilobular emphysematous changes are present. Paraseptal emphysematous changes are present as well. Bilateral ill-defined pulmonary nodules measure up to 5.5 mm on the right and 5.8 cm on the left. Thoracic inlet is within normal limits. Review of the  MIP images confirms the above findings CTA HEAD FINDINGS Anterior circulation: Atherosclerotic calcifications are present within the cavernous internal carotid arteries bilaterally. Mild pre cavernous left ICA narrowing is present measuring 60% relative to the more distal vessel. ICA termini are within normal limits. The A1 and M1 segments are normal.  Anterior communicating artery is patent. MCA bifurcations are intact. Early bifurcation is present on the right, a normal variant. ACA and MCA branch vessels are within normal limits. Posterior circulation: High-grade stenosis is present in the hypoplastic left vertebral artery at the dural margin. The right vertebral artery is within normal limits. Vertebrobasilar junction is normal. Mild narrowing is present in the proximal basilar artery. Basilar artery is otherwise within normal limits. Both posterior cerebral arteries originate from the basilar tip. Mild narrowing is present in the proximal left P2 segment. Branch vessels are within normal limits bilaterally. Venous sinuses: Dural sinuses are patent. The straight sinus and deep cerebral veins are intact. Cortical veins are unremarkable. Anatomic variants: None Review of the MIP images confirms the above findings IMPRESSION: 1. No emergent large vessel occlusion. 2. 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 Multiple segmental stenoses are present in the left vertebral artery throughout the neck. 3. Mild narrowing of the proximal basilar artery and proximal left P2 segment. 4. Mild narrowing of the pre cavernous left ICA measuring 60% relative to the more distal vessel. 5. No significant stenosis in the neck. 6. Multiple bilateral pulmonary nodules measuring up to 5 mm. These are likely inflammatory. Recommend CT of the chest without contrast for follow-up in 2-3 months. 7. Aortic Atherosclerosis (ICD10-I70.0) and Emphysema (ICD10-J43.9). These results were called by telephone at the time of interpretation on 09/15/2019 at 9:41 pm to provider [facility] , who verbally acknowledged these results. Electronically Signed   By: San Morelle M.D.   On: 09/15/2019 21:43   CT Angio Neck W and/or Wo Contrast  Result Date: 09/15/2019 CLINICAL DATA:  New onset left-sided weakness. EXAM: CT ANGIOGRAPHY HEAD AND NECK  TECHNIQUE: Multidetector CT imaging of the head and neck was performed using the standard protocol during bolus administration of intravenous contrast. Multiplanar CT image reconstructions and MIPs were obtained to evaluate the vascular anatomy. Carotid stenosis measurements (when applicable) are obtained utilizing NASCET criteria, using the distal internal carotid diameter as the denominator. CONTRAST:  137mL OMNIPAQUE IOHEXOL 350 MG/ML SOLN COMPARISON:  CT head without contrast 09/15/2019. CTA head and neck 06/27/2017 FINDINGS: CTA NECK FINDINGS Aortic arch: Dense atherosclerotic calcifications are again seen at the aortic arch and origins the great vessels without significant stenosis. No significant interval change is present. Right carotid system: The right common carotid artery is within normal limits. Bifurcation is unremarkable. Cervical right ICA is normal. Left carotid system: Left common carotid artery is somewhat irregular without focal stenosis. Minimal calcifications present bifurcation without significant stenosis. Cervical left ICA is normal. Vertebral arteries: The right vertebral artery is the dominant vessel. The left vertebral artery is hypoplastic. Segmental stenoses are present in the left vertebral artery in the neck. Moderate stenosis is present at C2, C4, and C6-7. Skeleton: Cervical spine is fused C2-6. Slight adjacent level anterolisthesis at C7-T1 is stable. Other neck: Soft tissues of the neck are otherwise unremarkable. Upper chest: Centrilobular emphysematous changes are present. Paraseptal emphysematous changes are present as well. Bilateral ill-defined pulmonary nodules measure up to 5.5 mm on the right and 5.8 cm on the left. Thoracic inlet is within normal limits. Review of  the MIP images confirms the above findings CTA HEAD FINDINGS Anterior circulation: Atherosclerotic calcifications are present within the cavernous internal carotid arteries bilaterally. Mild pre cavernous left  ICA narrowing is present measuring 60% relative to the more distal vessel. ICA termini are within normal limits. The A1 and M1 segments are normal. Anterior communicating artery is patent. MCA bifurcations are intact. Early bifurcation is present on the right, a normal variant. ACA and MCA branch vessels are within normal limits. Posterior circulation: High-grade stenosis is present in the hypoplastic left vertebral artery at the dural margin. The right vertebral artery is within normal limits. Vertebrobasilar junction is normal. Mild narrowing is present in the proximal basilar artery. Basilar artery is otherwise within normal limits. Both posterior cerebral arteries originate from the basilar tip. Mild narrowing is present in the proximal left P2 segment. Branch vessels are within normal limits bilaterally. Venous sinuses: Dural sinuses are patent. The straight sinus and deep cerebral veins are intact. Cortical veins are unremarkable. Anatomic variants: None Review of the MIP images confirms the above findings IMPRESSION: 1. No emergent large vessel occlusion. 2. 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 Multiple segmental stenoses are present in the left vertebral artery throughout the neck. 3. Mild narrowing of the proximal basilar artery and proximal left P2 segment. 4. Mild narrowing of the pre cavernous left ICA measuring 60% relative to the more distal vessel. 5. No significant stenosis in the neck. 6. Multiple bilateral pulmonary nodules measuring up to 5 mm. These are likely inflammatory. Recommend CT of the chest without contrast for follow-up in 2-3 months. 7. Aortic Atherosclerosis (ICD10-I70.0) and Emphysema (ICD10-J43.9). These results were called by telephone at the time of interpretation on 09/15/2019 at 9:41 pm to provider [facility] , who verbally acknowledged these results. Electronically Signed   By: San Morelle M.D.   On: 09/15/2019 21:43   MR  ANGIO HEAD WO CONTRAST  Result Date: 09/16/2019 CLINICAL DATA:  Left-sided weakness, acute onset EXAM: MRI HEAD WITHOUT CONTRAST MRA HEAD WITHOUT CONTRAST TECHNIQUE: Multiplanar, multiecho pulse sequences of the brain and surrounding structures were obtained without intravenous contrast. Angiographic images of the head were obtained using MRA technique without contrast. COMPARISON:  None. FINDINGS: MRI HEAD FINDINGS BRAIN: There is no acute infarct, acute hemorrhage or extra-axial collection. The white matter signal is normal for the patient's age. There is generalized atrophy without lobar predilection. Blood-sensitive sequences show no chronic microhemorrhage or superficial siderosis. The midline structures are normal. SKULL AND UPPER CERVICAL SPINE: The visualized skull base, calvarium, upper cervical spine and extracranial soft tissues are normal. SINUSES/ORBITS: No fluid levels or advanced mucosal thickening. No mastoid or middle ear effusion. The orbits are normal. MRA HEAD FINDINGS POSTERIOR CIRCULATION: --Basilar artery: Normal. --Posterior cerebral arteries: Normal. Both originate from the basilar artery. --Superior cerebellar arteries: Normal. --Inferior cerebellar arteries: Normal anterior and posterior inferior cerebellar arteries. ANTERIOR CIRCULATION: --Intracranial internal carotid arteries: Normal. --Anterior cerebral arteries: Normal. Both A1 segments are present. Patent anterior communicating artery. --Middle cerebral arteries: Normal. --Posterior communicating arteries: Present on the right, absent on the left. IMPRESSION: 1. No acute intracranial abnormality. 2. Generalized atrophy without lobar predilection. 3. Normal intracranial MRA. Electronically Signed   By: Ulyses Jarred M.D.   On: 09/16/2019 22:21   MR BRAIN WO CONTRAST  Result Date: 09/16/2019 CLINICAL DATA:  Left-sided weakness, acute onset EXAM: MRI HEAD WITHOUT CONTRAST MRA HEAD WITHOUT CONTRAST TECHNIQUE: Multiplanar, multiecho  pulse sequences of the brain and  surrounding structures were obtained without intravenous contrast. Angiographic images of the head were obtained using MRA technique without contrast. COMPARISON:  None. FINDINGS: MRI HEAD FINDINGS BRAIN: There is no acute infarct, acute hemorrhage or extra-axial collection. The white matter signal is normal for the patient's age. There is generalized atrophy without lobar predilection. Blood-sensitive sequences show no chronic microhemorrhage or superficial siderosis. The midline structures are normal. SKULL AND UPPER CERVICAL SPINE: The visualized skull base, calvarium, upper cervical spine and extracranial soft tissues are normal. SINUSES/ORBITS: No fluid levels or advanced mucosal thickening. No mastoid or middle ear effusion. The orbits are normal. MRA HEAD FINDINGS POSTERIOR CIRCULATION: --Basilar artery: Normal. --Posterior cerebral arteries: Normal. Both originate from the basilar artery. --Superior cerebellar arteries: Normal. --Inferior cerebellar arteries: Normal anterior and posterior inferior cerebellar arteries. ANTERIOR CIRCULATION: --Intracranial internal carotid arteries: Normal. --Anterior cerebral arteries: Normal. Both A1 segments are present. Patent anterior communicating artery. --Middle cerebral arteries: Normal. --Posterior communicating arteries: Present on the right, absent on the left. IMPRESSION: 1. No acute intracranial abnormality. 2. Generalized atrophy without lobar predilection. 3. Normal intracranial MRA. Electronically Signed   By: Ulyses Jarred M.D.   On: 09/16/2019 22:21   ECHOCARDIOGRAM COMPLETE  Result Date: 09/16/2019    ECHOCARDIOGRAM REPORT   Patient Name:   OLUWATOMISIN DEMAN Date of Exam: 09/16/2019 Medical Rec #:  [id number]      Height:       72.0 in Accession #:    [id number]     Weight:       179.0 lb Date of Birth:  [birth year]      BSA:          2.032 m Patient Age:    43 years       BP:           154/89 mmHg Patient Gender: M               HR:           77 bpm. Exam Location:  [facility] Procedure: 2D Echo Indications:    TIA 435.9 / G45.9  History:        Patient has no prior history of Echocardiogram examinations,                 most recent 06/28/2017. CAD, TIA, Arrythmias:Atrial Flutter,                 Signs/Symptoms:Chest Pain; Risk Factors:Hypertension, Diabetes,                 Dyslipidemia and Current Smoker.  Sonographer:    [name] RDCS (AE) Referring Phys: [id number] ASIA B Kaser  1. Left ventricular ejection fraction, by estimation, is 60 to 65%. The left ventricle has normal function. The left ventricle has no regional wall motion abnormalities. There is mild left ventricular hypertrophy. Left ventricular diastolic parameters are consistent with Grade I diastolic dysfunction (impaired relaxation). Elevated left ventricular end-diastolic pressure.  2. Right ventricular systolic function is normal. The right ventricular size is normal. There is normal pulmonary artery systolic pressure.  3. The mitral valve is grossly normal. Trivial mitral valve regurgitation.  4. The aortic valve is tricuspid. Aortic valve regurgitation is not visualized. Mild aortic valve sclerosis is present, with no evidence of aortic valve stenosis.  5. The inferior vena cava is normal in size with greater than 50% respiratory variability, suggesting right atrial pressure of 3 mmHg. FINDINGS  Left Ventricle: Left ventricular ejection fraction, by estimation,  is 60 to 65%. The left ventricle has normal function. The left ventricle has no regional wall motion abnormalities. The left ventricular internal cavity size was normal in size. There is  mild left ventricular hypertrophy. Left ventricular diastolic parameters are consistent with Grade I diastolic dysfunction (impaired relaxation). Elevated left ventricular end-diastolic pressure. Right Ventricle: The right ventricular size is normal. No increase in right ventricular wall thickness. Right  ventricular systolic function is normal. There is normal pulmonary artery systolic pressure. The tricuspid regurgitant velocity is 1.75 m/s, and  with an assumed right atrial pressure of 3 mmHg, the estimated right ventricular systolic pressure is 10.9 mmHg. Left Atrium: Left atrial size was normal in size. Right Atrium: Right atrial size was normal in size. Pericardium: There is no evidence of pericardial effusion. Mitral Valve: The mitral valve is grossly normal. Trivial mitral valve regurgitation. Tricuspid Valve: The tricuspid valve is grossly normal. Tricuspid valve regurgitation is trivial. Aortic Valve: The aortic valve is tricuspid. Aortic valve regurgitation is not visualized. Mild aortic valve sclerosis is present, with no evidence of aortic valve stenosis. Pulmonic Valve: The pulmonic valve was normal in structure. Pulmonic valve regurgitation is not visualized. Aorta: The aortic root and ascending aorta are structurally normal, with no evidence of dilitation. Venous: The inferior vena cava is normal in size with greater than 50% respiratory variability, suggesting right atrial pressure of 3 mmHg. IAS/Shunts: The interatrial septum was not well visualized.  LEFT VENTRICLE PLAX 2D LVIDd:         4.71 cm  Diastology LVIDs:         3.65 cm  LV e' lateral:   5.98 cm/s LV PW:         1.37 cm  LV E/e' lateral: 17.4 LV IVS:        1.18 cm  LV e' medial:    5.98 cm/s LVOT diam:     2.00 cm  LV E/e' medial:  17.4 LVOT Area:     3.14 cm  RIGHT VENTRICLE RV S prime:     18.80 cm/s TAPSE (M-mode): 3.3 cm LEFT ATRIUM             Index       RIGHT ATRIUM           Index LA diam:        3.60 cm 1.77 cm/m  RA Area:     17.30 cm LA Vol (A2C):   49.0 ml 24.11 ml/m RA Volume:   50.20 ml  24.70 ml/m LA Vol (A4C):   36.1 ml 17.76 ml/m LA Biplane Vol: 46.0 ml 22.63 ml/m   AORTA Ao Root diam: 3.10 cm MITRAL VALVE                TRICUSPID VALVE MV Area (PHT): 3.61 cm     TR Peak grad:   12.2 mmHg MV Decel Time: 210 msec      TR Vmax:        175.00 cm/s MV E velocity: 104.00 cm/s MV A velocity: 102.00 cm/s  SHUNTS MV E/A ratio:  1.02         Systemic Diam: 2.00 cm Lyman Bishop MD Electronically signed by Lyman Bishop MD Signature Date/Time: 09/16/2019/1:31:31 PM    Final    CT HEAD CODE STROKE WO CONTRAST  Result Date: 09/15/2019 CLINICAL DATA:  Code stroke. New onset left-sided weakness beginning 1 hour ago. EXAM: CT HEAD WITHOUT CONTRAST TECHNIQUE: Contiguous axial images were obtained from the base of  the skull through the vertex without intravenous contrast. COMPARISON:  CT head without contrast/6/19 FINDINGS: Brain: No acute infarct, hemorrhage, or mass lesion is present. Mild white matter changes are stable, and likely within normal limits for age. Basal ganglia are intact. Insular ribbon is normal bilaterally. The ventricles are of normal size. No significant extraaxial fluid collection is present. The brainstem and cerebellum are within normal limits. Vascular: Atherosclerotic calcifications are present within the cavernous internal carotid arteries bilaterally. No hyperdense vessel is present. Skull: Calvarium is intact. No focal lytic or blastic lesions are present. No significant extracranial soft tissue lesion is present. Sinuses/Orbits: The paranasal sinuses and mastoid air cells are clear. The globes and orbits are within normal limits. ASPECTS [facility] Stroke Program Early CT Score) - Ganglionic level infarction (caudate, lentiform nuclei, internal capsule, insula, M1-M3 cortex): 7/7 - Supraganglionic infarction (M4-M6 cortex): 3/3 Total score (0-10 with 10 being normal): 10/10 IMPRESSION: 1. Stable mild white matter disease. 2. No acute intracranial abnormality or significant interval change. 3. ASPECTS is 10/10. These results were called by telephone at the time of interpretation on 09/15/2019 at 9:12 pm to provider [facility] , who verbally acknowledged these results. Electronically Signed   By: San Morelle M.D.   On: 09/15/2019 21:13    Microbiology: Recent Results (from the past 240 hour(s))  SARS Coronavirus 2 by RT PCR (hospital order, performed in [facility] hospital lab) Nasopharyngeal Nasopharyngeal Swab     Status: None   Collection Time: 09/15/19 11:10 PM   Specimen: Nasopharyngeal Swab  Result Value Ref Range Status   SARS Coronavirus 2 NEGATIVE NEGATIVE Final    Comment: (NOTE) SARS-CoV-2 target nucleic acids are NOT DETECTED.  The SARS-CoV-2 RNA is generally detectable in upper and lower respiratory specimens during the acute phase of infection. The lowest concentration of SARS-CoV-2 viral copies this assay can detect is 250 copies / mL. A negative result does not preclude SARS-CoV-2 infection and should not be used as the sole basis for treatment or other patient management decisions.  A negative result may occur with improper specimen collection / handling, submission of specimen other than nasopharyngeal swab, presence of viral mutation(s) within the areas targeted by this assay, and inadequate number of viral copies (<250 copies / mL). A negative result must be combined with clinical observations, patient history, and epidemiological information.  Fact Sheet for Patients:   StrictlyIdeas.no  Fact Sheet for Healthcare Providers: BankingDealers.co.za  This test is not yet approved or  cleared by the [place] FDA and has been authorized for detection and/or diagnosis of SARS-CoV-2 by FDA under an Emergency Use Authorization (EUA).  This EUA will remain in effect (meaning this test can be used) for the duration of the COVID-19 declaration under Section 564(b)(1) of the Act, 21 U.S.C. section 360bbb-3(b)(1), unless the authorization is terminated or revoked sooner.  Performed at [facility], [address], [place], [place] [address]      Labs: Basic Metabolic Panel: Recent Labs  Lab 09/15/19 2057  09/16/19 0614 09/17/19 0754  NA 136 135 140  K 4.6 4.0 4.3  CL 100 101 105  CO2 26 26 27   GLUCOSE 243* 339* 276*  BUN 34* 35* 17  CREATININE 2.00* 1.73* 1.23  CALCIUM 9.3 8.7* 8.8*  MG  --   --  1.5*   Liver Function Tests: Recent Labs  Lab 09/15/19 2057 09/16/19 0614  AST 21 15  ALT 25 21  ALKPHOS 91 83  BILITOT 0.5 0.4  PROT 6.9 6.1*  ALBUMIN 4.0 3.3*   No results for input(s): LIPASE, AMYLASE in the last 168 hours. No results for input(s): AMMONIA in the last 168 hours. CBC: Recent Labs  Lab 09/15/19 2057 09/16/19 0614 09/17/19 0754  WBC 7.9 7.5  --   NEUTROABS 5.2  --   --   HGB 13.0 12.1* 13.0  HCT 40.3 36.7* 40.1  MCV 90.2 89.1  --   PLT 194 170  --    Cardiac Enzymes: No results for input(s): CKTOTAL, CKMB, CKMBINDEX, TROPONINI in the last 168 hours. BNP: BNP (last 3 results) No results for input(s): BNP in the last 8760 hours.  ProBNP (last 3 results) No results for input(s): PROBNP in the last 8760 hours.  CBG: Recent Labs  Lab 09/16/19 1137 09/16/19 1615 09/16/19 2300 09/17/19 0642 09/17/19 1230  GLUCAP 288* 89 292* 148* 220*       Signed:  Skiler Olden  [facility] 09/17/2019, 3:36 PM

## 2019-09-17 NOTE — Progress Notes (Signed)
SLP Cancellation Note  Patient Details Name: Richard [name] MRN: [id number] DOB: 18-Dec-1945   Cancelled treatment:        Screened pt for cognitive linguistic deficits. Pt oriented x4.  Pt's speech is clear and he is able to participate in conversation. Pt denies deficits and states that he has returned to baseline. MRI negative.  Evaluation deferred. SLP will sign off at this time, no needs identified.   Celedonio Savage, [place], [place] Office: [phone]  09/17/2019, 10:22 AM

## 2019-09-17 NOTE — Progress Notes (Addendum)
Patient ready for discharge to home; discharge instructions given and reviewed; no new RX's. Patient administered his 1600 SSI with good tech.; observation by this RN.  Diabetic starter kit and diabetes teaching book given to patient; wife present at bedside. All appointments to follow up reviewed with patient; urology appointment date not present on patients copy of AVS; told patient and his wife to call Dr.Wrenn's office if they don't receive the call today.  All belongings returned; patient discharged out via wheelchair.

## 2019-09-17 NOTE — Progress Notes (Signed)
Code Stroke Call none Beeper none Exam started 2103 Exam finished 2103  Images sent to soc 2103 Exam completed in [facility] 2105 [facility] radiology called 2103

## 2019-09-17 NOTE — Progress Notes (Addendum)
Inpatient Diabetes Program Recommendations  AACE/ADA: New Consensus Statement on Inpatient Glycemic Control (2015)  Target Ranges:  Prepandial:   less than 140 mg/dL      Peak postprandial:   less than 180 mg/dL (1-2 hours)      Critically ill patients:  140 - 180 mg/dL   Lab Results  Component Value Date   GLUCAP 220 (H) 09/17/2019   HGBA1C 10.6 (H) 09/16/2019    Review of Glycemic Control  Results for Richard, [name] (MRN [id number]) as of 09/17/2019 14:30  Ref. Range 09/16/2019 08:20 09/16/2019 11:37 09/16/2019 16:15 09/16/2019 23:00 09/17/2019 06:42 09/17/2019 12:30  Glucose-Capillary Latest Ref Range: 70 - 99 mg/dL 284 (H) Novolog 5units @10 :14 288 (H) Novolog 8units @12 :16 89 292 (H) Novolog 3 units 148 (H) Novolog 2units at 07:00 220 (H) Novolog 5units at  13:08   Diabetes history:  DM2 Outpatient Diabetes medications:  Actos 30 mg daily  Glipizide 10 mg BID Metformin 1000 mg BID Current orders for Inpatient glycemic control:  Novolog 0-15 units tid  Novolog 0-5 QHS   Inpatient Diabetes Recommendations:  Novolog meal coverage 3 units TID with meals if eats at least 50% of meal.  Fasting BS was 148 mg/dl this am.  Might consider low dose basal if fasting is elevated in am.     Note: Spoke with patient at bedside..  Reviewed patient's current A1c of 10.6%. Explained what a A1c is and what it measures. Also reviewed goal A1c with patient, importance of good glucose control @ home, and blood sugar goals.  He states his PCP has educated him on insulin and sent in a prescription for 70/30 10 units BID but he has not picked it up yet.  He states he realizes he needs to start administering insulin because he states, "I don't want to lose my feet".  Educated patient on insulin pen use at home. Reviewed contents of insulin flexpen starter kit. Reviewed all steps if insulin pen including attachment of needle, 2-unit air shot, dialing up dose, giving injection, removing needle, disposal  of sharps, storage of unused insulin, disposal of insulin etc. Explained that each insulin pen has multiple doses and can last 3-4 weeks.  Patient able to provide successful return demonstration. Also reviewed troubleshooting with insulin pen. He does not drink and beverages with regular sugar and states he tries to watch his CHO intake but her really likes bread.  Reviewed The Plate Method and goal CHO per meal of 50-60 grams.  Discussed how exercise can help lower blood sugar.  Educated on hypoglycemia, symptoms and treatment. Ordered Living Well with Diabetes booklet.  He is current With Dr. Lorel Monaco for PCP and will need close follow up at discharge.  He is aware to bring his meter with him to PCP appointment for review.    Please allow patient to administer insulin with the guidance of the RN.  Will continue to follow while inpatient.  Thank you, Reche Dixon, RN, BSN Diabetes Coordinator Inpatient Diabetes Program [phone] (team pager from 8a-5p)

## 2019-09-17 NOTE — Discharge Instructions (Signed)
Transient Ischemic Attack  A transient ischemic attack (TIA) is a "warning stroke" that causes stroke-like symptoms that go away quickly. A TIA does not cause lasting damage to the brain. But having a TIA is a sign that you may be at risk for a stroke. Lifestyle changes and medical treatments can help prevent a stroke. It is important to know the symptoms of a TIA and what to do. Get help right away, even if your symptoms go away. The symptoms of a TIA are the same as those of a stroke. They can happen fast, and they usually go away within minutes or hours. They can include:  Weakness or loss of feeling in your face, arm, or leg. This often happens on one side of your body.  Trouble walking.  Trouble moving your arms or legs.  Trouble talking or understanding what people are saying.  Trouble seeing.  Seeing two of one object (double vision).  Feeling dizzy.  Feeling confused.  Loss of balance or coordination.  Feeling sick to your stomach (nauseous) and throwing up (vomiting).  A very bad headache for no reason. What increases the risk? Certain things may make you more likely to have a TIA. Some of these are things that you can change, such as:  Being very overweight (obese).  Using products that contain nicotine or tobacco, such as cigarettes and e-cigarettes.  Taking birth control pills.  Not being active.  Drinking too much alcohol.  Using drugs. Other risk factors include:  Having an irregular heartbeat (atrial fibrillation).  Being African American or Hispanic.  Having had blood clots, stroke, TIA, or heart attack in the past.  Being a woman with a history of high blood pressure in pregnancy (preeclampsia).  Being over the age of 60.  Being male.  Having family history of stroke.  Having the following diseases or conditions: ? High blood pressure. ? High cholesterol. ? Diabetes. ? Heart disease. ? Sickle cell disease. ? Sleep apnea. ? Migraine  headache. ? Long-term (chronic) diseases that cause soreness and swelling (inflammation). ? Disorders that affect how your blood clots. Follow these instructions at home: Medicines   Take over-the-counter and prescription medicines only as told by your doctor.  If you were told to take aspirin or another medicine to thin your blood, take it exactly as told by your doctor. ? Taking too much of the medicine can cause bleeding. ? Taking too little of the medicine may not work to treat the problem. Eating and drinking   Eat 5 or more servings of fruits and vegetables each day.  Follow instructions from your doctor about your diet. You may need to follow a certain diet to help lower your risk of having a stroke. You may need to: ? Eat a diet that is low in fat and salt. ? Eat foods that contain a lot of fiber. ? Limit the amount of carbohydrates and sugar in your diet.  Limit alcohol intake to 1 drink a day for nonpregnant women and 2 drinks a day for men. One drink equals 12 oz of beer, 5 oz of wine, or 1 oz of hard liquor. General instructions  Keep a healthy weight.  Stay active. Try to get at least 30 minutes of activity on all or most days.  Find out if you have a condition called sleep apnea. Get treatment if needed.  Do not use any products that contain nicotine or tobacco, such as cigarettes and e-cigarettes. If you need help quitting,   ask your doctor.  Do not abuse drugs.  Keep all follow-up visits as told by your doctor. This is important. Get help right away if:  You have any signs of stroke. "BE FAST" is an easy way to remember the main warning signs: ? B - Balance. Signs are dizziness, sudden trouble walking, or loss of balance. ? E - Eyes. Signs are trouble seeing or a sudden change in how you see. ? F - Face. Signs are sudden weakness or loss of feeling of the face, or the face or eyelid drooping on one side. ? A - Arms. Signs are weakness or loss of feeling in an  arm. This happens suddenly and usually on one side of the body. ? S - Speech. Signs are sudden trouble speaking, slurred speech, or trouble understanding what people say. ? T - Time. Time to call emergency services. Write down what time symptoms started.  You have other signs of stroke, such as: ? A sudden, very bad headache with no known cause. ? Feeling sick to your stomach (nausea). ? Throwing up (vomiting). ? Jerky movements that you cannot control (seizure). These symptoms may be an emergency. Do not wait to see if the symptoms will go away. Get medical help right away. Call your local emergency services (911 in the [place] Do not drive yourself to the hospital. Summary  A transient ischemic attack (TIA) is a "warning stroke" that causes stroke-like symptoms that go away quickly.  A TIA is a medical emergency. Get help right away, even if your symptoms go away.  A TIA does not cause lasting damage to the brain.  Having a TIA is a sign that you may be at risk for a stroke. Lifestyle changes and medical treatments can help prevent a stroke. This information is not intended to replace advice given to you by your health care provider. Make sure you discuss any questions you have with your health care provider. Document Revised: 11/04/2017 Document Reviewed: 05/12/2016 Elsevier Patient Education  2020 Elsevier Inc.  

## 2019-09-17 NOTE — Evaluation (Signed)
Physical Therapy Evaluation and Discharge Patient Details Name: Richard [name] MRN: [id number] DOB: 12-Dec-1945 Today's Date: 09/17/2019   History of Present Illness  Pt is a 74 y.o. male with PMH of HTN, CAD, TIA, type 2 DM, HLD, atrial flutter, and DDD. Presenting to ED with headache and diaphoresis, slurred speech, generalized weakness - more on L side. CT and MRI negative for stroke.  Clinical Impression   Patient evaluated by Physical Therapy with no further PT needs identified. All education has been completed and the patient has no further questions. Pt scored 24/24 on Dynamic Gait Index demonstrating low fall risk.  PT is signing off. Thank you for this referral.     Follow Up Recommendations No PT follow up    Equipment Recommendations  None recommended by PT    Recommendations for Other Services       Precautions / Restrictions Precautions Precautions: None      Mobility  Bed Mobility Overal bed mobility: Independent                Transfers Overall transfer level: Independent Equipment used: None             General transfer comment: no imbalance; close monitoring for dizziness and pt asymptomatic  Ambulation/Gait Ambulation/Gait assistance: Independent Gait Distance (Feet): 400 Feet Assistive device: None Gait Pattern/deviations: WFL(Within Functional Limits)   Gait velocity interpretation: >2.62 ft/sec, indicative of community ambulatory    Stairs Stairs: Yes Stairs assistance: Independent Stair Management: No rails;Forwards Number of Stairs: 2 General stair comments: also able to do alternating step taps on bottom step (from [place]) without difficulty  Wheelchair Mobility    Modified Rankin (Stroke Patients Only)       Balance Overall balance assessment: Independent                               Standardized Balance Assessment Standardized Balance Assessment : Dynamic Gait Index   Dynamic Gait Index Level Surface:  Normal Change in Gait Speed: Normal Gait with Horizontal Head Turns: Normal Gait with Vertical Head Turns: Normal Gait and Pivot Turn: Normal Step Over Obstacle: Normal Step Around Obstacles: Normal Steps: Normal Total Score: 24       Pertinent Vitals/Pain Pain Assessment: 0-10 Pain Score: 2  Pain Location: low back Pain Descriptors / Indicators: Aching Pain Intervention(s): Limited activity within patient's tolerance    Home Living Family/patient expects to be discharged to:: Private residence Living Arrangements: Spouse/significant other Available Help at Discharge: Family Type of Home: House Home Access: Stairs to enter Entrance Stairs-Rails: None Entrance Stairs-Number of Steps: 2 Home Layout: One level Home Equipment: Cane - single point;Other (comment) (assorted equipment from other family members)      Prior Function Level of Independence: Independent         Comments: retired Administrator; garden     Journalist, newspaper   Dominant Hand: Right    Extremity/Trunk Assessment   Upper Extremity Assessment Upper Extremity Assessment: Overall WFL for tasks assessed    Lower Extremity Assessment Lower Extremity Assessment: Overall WFL for tasks assessed    Cervical / Trunk Assessment Cervical / Trunk Assessment: Normal  Communication   Communication: No difficulties  Cognition Arousal/Alertness: Awake/alert Behavior During Therapy: WFL for tasks assessed/performed Overall Cognitive Status: Within Functional Limits for tasks assessed  General Comments      Exercises     Assessment/Plan    PT Assessment Patent does not need any further PT services  PT Problem List         PT Treatment Interventions      PT Goals (Current goals can be found in the Care Plan section)  Acute Rehab PT Goals PT Goal Formulation: All assessment and education complete, DC therapy    Frequency     Barriers to  discharge        Co-evaluation               AM-PAC PT "6 Clicks" Mobility  Outcome Measure Help needed turning from your back to your side while in a flat bed without using bedrails?: None Help needed moving from lying on your back to sitting on the side of a flat bed without using bedrails?: None Help needed moving to and from a bed to a chair (including a wheelchair)?: None Help needed standing up from a chair using your arms (e.g., wheelchair or bedside chair)?: None Help needed to walk in hospital room?: None Help needed climbing 3-5 steps with a railing? : None 6 Click Score: 24    End of Session Equipment Utilized During Treatment: Gait belt Activity Tolerance: Patient tolerated treatment well Patient left: in chair;with call bell/phone within reach (RN agreed no chair alarm needed) Nurse Communication: Mobility status;Other (comment) (no PT needs) PT Visit Diagnosis: Dizziness and giddiness (R42)    Time: 4656-8127 PT Time Calculation (min) (ACUTE ONLY): 28 min   Charges:   PT Evaluation $PT Eval Low Complexity: 1 Low PT Treatments $Therapeutic Activity: 8-22 mins         Arby Barrette, PT Pager [phone]   Rexanne Mano 09/17/2019, 9:44 AM

## 2019-09-17 NOTE — Progress Notes (Signed)
Pt  States "there is blood in my urine". Asked patient to void into a urinal, Pt's urine was bloody red.  Pt states "It burns when I void, but I did tall the doctor in [facility]"

## 2019-09-17 NOTE — Progress Notes (Addendum)
OT Cancellation Note  Patient Details Name: SHAYAAN PARKE MRN: [id number] DOB: Jul 06, 1945   Cancelled Treatment:    Reason Eval/Treat Not Completed: OT screened, no needs identified, will sign off. Pt reports he is feeling back to baseline and needs no further follow up or equipment. Pt states his only concern is his blood in his urine is waiting for follow up with the doctor to go home.   Shakenya Stoneberg/OTS Evaan Tidwell 09/17/2019, 10:03 AM

## 2019-09-17 NOTE — TOC Transition Note (Signed)
Transition of Care [facility]) - CM/SW Discharge Note   Patient Details  Name: Richard [name] MRN: [id number] Date of Birth: Jun 30, 1945  [facility] CM/SW Contact:  Pollie Friar, RN Phone Number: 09/17/2019, 4:36 PM   Clinical Narrative:    Pt is discharging home with self care. No f/u per PT/OT.  Pt has transportation home.   Final next level of care: Home/Self Care Barriers to Discharge: No Barriers Identified   Patient Goals and CMS Choice        Discharge Placement                       Discharge Plan and Services                                     Social Determinants of Health (SDOH) Interventions     Readmission Risk Interventions No flowsheet data found.

## 2019-09-17 NOTE — Consult Note (Signed)
Stroke Neurology Consultation Note  Consult Requested by: Medical Hospitalists  Reason for Consult: ? L sided weakness. Seen by teleneurology at [facility] ED on 09/15/2019 at 2148  Consult Date:  09/17/19  History of Present Illness:  Richard [name] is an 74 y.o. Caucasian male with PMH of CAD, ? AF s/p ablation and recent severe hematuria presenting to [facility] ED w/ episode of dizziness, hypotension, diaphresis. While standing, he had sudden onset lightheadedness, perfuse sweating, generalized weakness with b/l knee buckling, confused and slurry speech. He also complained of severe HA 9/10, chest pain and back pain. EMS called, on arrival his BP 80/50, with IVF his BP up to 140s in ER, symptoms gradually improved and later resolved. teleneurology saw pt and reported left sided weakness at onset which family did not endorse to me. CT negative, no tPA given due to recent hematuria, low NIH. He was recommended transfer to [facility] for admission and stroke workup.   As per pt and wife, he had hematuria for the last several days. CT abd/pelvis concerning for bladder mass. Urology consulted.   Date last known well: Date: 09/15/2019 Time last known well: Time: 20:00 tPA Given: No: recent hyematuria, low NIH, pt refused MRS:  0 NIHSS:  2 on presentation  Past Medical History:  Diagnosis Date  . CAD (coronary artery disease)    a. 2007 - DES x 2 proximal to mid RCA b. 09/2017: DES to mid LAD and OM2. Patent stents along RCA.   . Essential hypertension   . History of atrial flutter    Status post ablation 2008 - Dr. Caryl Comes  . History of transient ischemic attack (TIA)   . Mixed hyperlipidemia    Statin intolerance  . Pneumonia   . Type 2 diabetes mellitus (HCC)    A1C 11.5 06/2017     Past Surgical History:  Procedure Laterality Date  . BALLOON ANGIOPLASTY, ARTERY    . CARDIAC ELECTROPHYSIOLOGY MAPPING AND ABLATION    . CARPAL TUNNEL RELEASE Bilateral   . cervical neck  fusion     x 4  . CORONARY STENT INTERVENTION N/A 10/17/2017   Procedure: CORONARY STENT INTERVENTION;  Surgeon: Martinique, Peter M, MD;  Location: [place] [facility];  Service: Cardiovascular;  Laterality: N/A;  . CORONARY STENT PLACEMENT     x 2  . LEFT HEART CATH AND CORONARY ANGIOGRAPHY N/A 10/17/2017   Procedure: LEFT HEART CATH AND CORONARY ANGIOGRAPHY;  Surgeon: Martinique, Peter M, MD;  Location: [place] [facility];  Service: Cardiovascular;  Laterality: N/A;  . ROTATOR CUFF REPAIR Right    x 4  . TIBIA FRACTURE SURGERY Left     Family History  Problem Relation Age of Onset  . Diabetes Mother   . Heart disease Mother   . Heart disease Brother   . Emphysema Father   . Lung cancer Brother        x 2  . Colon cancer Neg Hx   . Esophageal cancer Neg Hx   . Rectal cancer Neg Hx   . Stomach cancer Neg Hx   . Prostate cancer Neg Hx     Social History:  reports that he has been smoking cigarettes. He has a 54.00 pack-year smoking history. He has never used smokeless tobacco. He reports that he does not drink alcohol and does not use drugs.  Review of Systems: A full ROS was attempted today and was able to be performed.  Systems assessed include -  Constitutional, Eyes, HENT, Respiratory, Cardiovascular, Gastrointestinal, Genitourinary, Integument/breast, Hematologic/lymphatic, Musculoskeletal, Neurological, Behavioral/Psych, Endocrine, Allergic/Immunologic - with pertinent responses as per HPI.  Allergies:  Allergies  Allergen Reactions  . Statins     Leg pain     Medications:  Prior to Admission:  Medications Prior to Admission  Medication Sig Dispense Refill Last Dose  . aspirin 325 MG EC tablet Take 325 mg by mouth daily.   09/15/2019 at 0700  . atorvastatin (LIPITOR) 10 MG tablet Take 10 mg by mouth daily.   09/15/2019  . glipiZIDE (GLUCOTROL XL) 10 MG 24 hr tablet Take 10 mg by mouth 2 (two) times daily.   09/15/2019  . irbesartan (AVAPRO) 300 MG tablet Take 300 mg by mouth  daily.   09/15/2019  . isosorbide mononitrate (IMDUR) 30 MG 24 hr tablet Take 1 tablet (30 mg total) by mouth at bedtime. 30 tablet 6 09/15/2019 at 1800  . metoprolol tartrate (LOPRESSOR) 100 MG tablet Take 1 tablet (100 mg total) by mouth 2 (two) times daily. 60 tablet 6 09/15/2019 at 1800  . nitroGLYCERIN (NITROSTAT) 0.4 MG SL tablet Place 1 tablet (0.4 mg total) under the tongue every 5 (five) minutes as needed for chest pain. 25 tablet 3 Past Month at Unknown time  . pioglitazone (ACTOS) 30 MG tablet Take 30 mg by mouth daily.   09/15/2019   Scheduled: . [START ON 09/18/2019] aspirin  325 mg Oral Daily  . atorvastatin  40 mg Oral Daily  . insulin aspart  0-15 Units Subcutaneous TID WC  . insulin aspart  0-5 Units Subcutaneous QHS    Test Results: CBC:  Recent Labs  Lab 09/15/19 2057 09/15/19 2057 09/16/19 0614 09/17/19 0754  WBC 7.9  --  7.5  --   NEUTROABS 5.2  --   --   --   HGB 13.0   < > 12.1* 13.0  HCT 40.3   < > 36.7* 40.1  MCV 90.2  --  89.1  --   PLT 194  --  170  --    < > = values in this interval not displayed.   Basic Metabolic Panel:  Recent Labs  Lab 09/16/19 0614 09/17/19 0754  NA 135 140  K 4.0 4.3  CL 101 105  CO2 26 27  GLUCOSE 339* 276*  BUN 35* 17  CREATININE 1.73* 1.23  CALCIUM 8.7* 8.8*  MG  --  1.5*   Liver Function Tests: Recent Labs  Lab 09/15/19 2057 09/16/19 0614  AST 21 15  ALT 25 21  ALKPHOS 91 83  BILITOT 0.5 0.4  PROT 6.9 6.1*  ALBUMIN 4.0 3.3*   Coagulation Studies:  Recent Labs    09/15/19 2057  LABPROT 12.8  INR 1.0   CBG:  Recent Labs  Lab 09/16/19 1137 09/16/19 1615 09/16/19 2300 09/17/19 0642 09/17/19 1230  GLUCAP 288* 89 292* 148* 220*   Urinalysis:  Recent Labs  Lab 09/15/19 2324  COLORURINE AMBER*  LABSPEC 1.042*  PHURINE 5.0  GLUCOSEU 50*  HGBUR LARGE*  BILIRUBINUR NEGATIVE  KETONESUR NEGATIVE  PROTEINUR 100*  NITRITE NEGATIVE  LEUKOCYTESUR NEGATIVE   Microbiology:  Results for orders  placed or performed during the hospital encounter of 09/15/19  SARS Coronavirus 2 by RT PCR (hospital order, performed in [facility] hospital lab) Nasopharyngeal Nasopharyngeal Swab     Status: None   Collection Time: 09/15/19 11:10 PM   Specimen: Nasopharyngeal Swab  Result Value Ref Range Status   SARS Coronavirus 2 NEGATIVE  NEGATIVE Final    Comment: (NOTE) SARS-CoV-2 target nucleic acids are NOT DETECTED.  The SARS-CoV-2 RNA is generally detectable in upper and lower respiratory specimens during the acute phase of infection. The lowest concentration of SARS-CoV-2 viral copies this assay can detect is 250 copies / mL. A negative result does not preclude SARS-CoV-2 infection and should not be used as the sole basis for treatment or other patient management decisions.  A negative result may occur with improper specimen collection / handling, submission of specimen other than nasopharyngeal swab, presence of viral mutation(s) within the areas targeted by this assay, and inadequate number of viral copies (<250 copies / mL). A negative result must be combined with clinical observations, patient history, and epidemiological information.  Fact Sheet for Patients:   StrictlyIdeas.no  Fact Sheet for Healthcare Providers: BankingDealers.co.za  This test is not yet approved or  cleared by the [place] FDA and has been authorized for detection and/or diagnosis of SARS-CoV-2 by FDA under an Emergency Use Authorization (EUA).  This EUA will remain in effect (meaning this test can be used) for the duration of the COVID-19 declaration under Section 564(b)(1) of the Act, 21 U.S.C. section 360bbb-3(b)(1), unless the authorization is terminated or revoked sooner.  Performed at [facility], [address], [place], [place] [address]    Lipid Panel:     Component Value Date/Time   CHOL 157 09/16/2019 0614   TRIG 121 09/16/2019 0614   HDL 39  (L) 09/16/2019 0614   CHOLHDL 4.0 09/16/2019 0614   VLDL 24 09/16/2019 0614   LDLCALC 94 09/16/2019 0614   HgbA1c:  Lab Results  Component Value Date   HGBA1C 10.6 (H) 09/16/2019   Urine Drug Screen:     Component Value Date/Time   LABOPIA NONE DETECTED 09/15/2019 2324   COCAINSCRNUR NONE DETECTED 09/15/2019 2324   LABBENZ NONE DETECTED 09/15/2019 2324   AMPHETMU NONE DETECTED 09/15/2019 2324   THCU NONE DETECTED 09/15/2019 2324   LABBARB NONE DETECTED 09/15/2019 2324    Alcohol Level:  Recent Labs  Lab 09/15/19 2057  ETH <10    CT ABDOMEN PELVIS W WO CONTRAST  Result Date: 09/16/2019 CLINICAL DATA:  Hematuria EXAM: CT ABDOMEN AND PELVIS WITHOUT AND WITH CONTRAST TECHNIQUE: Multidetector CT imaging of the abdomen and pelvis was performed following the standard protocol before and following the bolus administration of intravenous contrast. CONTRAST:  80 mL OMNIPAQUE IOHEXOL 300 MG/ML  SOLN COMPARISON:  CT chest and abdomen angiogram, 11/25/2005 FINDINGS: Lower chest: No acute abnormality. Hepatobiliary: No solid liver abnormality is seen. No gallstones, gallbladder wall thickening, or biliary dilatation. Pancreas: Unremarkable. No pancreatic ductal dilatation or surrounding inflammatory changes. Spleen: Normal in size without significant abnormality. Adrenals/Urinary Tract: Adrenal glands are unremarkable. There is excreted contrast in the urinary tract, in keeping with contrast enhanced examination performed approximately 12 hours prior. Perinephric stranding and fluid. There is a contrast enhancing endoluminal mass of the left aspect of the anterior bladder dome measuring approximately 2.7 x 2.3 x 1.8 cm (series 2, image 64, series 5, image 51). Stomach/Bowel: Stomach is within normal limits. Appendix appears normal. No evidence of bowel wall thickening, distention, or inflammatory changes. Colonic diverticulosis. Vascular/Lymphatic: Aortic atherosclerosis. Prominent subcentimeter  retroperitoneal lymph nodes unchanged compared to remote prior examination dated 2007. Reproductive: No mass or other significant abnormality. Other: No abdominal wall hernia or abnormality. No abdominopelvic ascites. Musculoskeletal: No acute or significant osseous findings. IMPRESSION: 1. 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 2. No evidence of lymphadenopathy or metastatic disease in the abdomen or pelvis. Prominent subcentimeter retroperitoneal lymph nodes unchanged compared to remote prior examination dated 2007. 3. Aortic Atherosclerosis (ICD10-I70.0). Electronically Signed   By: Eddie Candle M.D.   On: 09/16/2019 14:58   CT Angio Head W or Wo Contrast  Result Date: 09/15/2019 CLINICAL DATA:  New onset left-sided weakness. EXAM: CT ANGIOGRAPHY HEAD AND NECK TECHNIQUE: Multidetector CT imaging of the head and neck was performed using the standard protocol during bolus administration of intravenous contrast. Multiplanar CT image reconstructions and MIPs were obtained to evaluate the vascular anatomy. Carotid stenosis measurements (when applicable) are obtained utilizing NASCET criteria, using the distal internal carotid diameter as the denominator. CONTRAST:  179mL OMNIPAQUE IOHEXOL 350 MG/ML SOLN COMPARISON:  CT head without contrast 09/15/2019. CTA head and neck 06/27/2017 FINDINGS: CTA NECK FINDINGS Aortic arch: Dense atherosclerotic calcifications are again seen at the aortic arch and origins the great vessels without significant stenosis. No significant interval change is present. Right carotid system: The right common carotid artery is within normal limits. Bifurcation is unremarkable. Cervical right ICA is normal. Left carotid system: Left common carotid artery is somewhat irregular without focal stenosis. Minimal calcifications present bifurcation without  significant stenosis. Cervical left ICA is normal. Vertebral arteries: The right vertebral artery is the dominant vessel. The left vertebral artery is hypoplastic. Segmental stenoses are present in the left vertebral artery in the neck. Moderate stenosis is present at C2, C4, and C6-7. Skeleton: Cervical spine is fused C2-6. Slight adjacent level anterolisthesis at C7-T1 is stable. Other neck: Soft tissues of the neck are otherwise unremarkable. Upper chest: Centrilobular emphysematous changes are present. Paraseptal emphysematous changes are present as well. Bilateral ill-defined pulmonary nodules measure up to 5.5 mm on the right and 5.8 cm on the left. Thoracic inlet is within normal limits. Review of the MIP images confirms the above findings CTA HEAD FINDINGS Anterior circulation: Atherosclerotic calcifications are present within the cavernous internal carotid arteries bilaterally. Mild pre cavernous left ICA narrowing is present measuring 60% relative to the more distal vessel. ICA termini are within normal limits. The A1 and M1 segments are normal. Anterior communicating artery is patent. MCA bifurcations are intact. Early bifurcation is present on the right, a normal variant. ACA and MCA branch vessels are within normal limits. Posterior circulation: High-grade stenosis is present in the hypoplastic left vertebral artery at the dural margin. The right vertebral artery is within normal limits. Vertebrobasilar junction is normal. Mild narrowing is present in the proximal basilar artery. Basilar artery is otherwise within normal limits. Both posterior cerebral arteries originate from the basilar tip. Mild narrowing is present in the proximal left P2 segment. Branch vessels are within normal limits bilaterally. Venous sinuses: Dural sinuses are patent. The straight sinus and deep cerebral veins are intact. Cortical veins are unremarkable. Anatomic variants: None Review of the MIP images confirms the above  findings IMPRESSION: 1. No emergent large vessel occlusion. 2. 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 Multiple segmental stenoses are present in the left vertebral artery throughout the neck. 3. Mild narrowing of the proximal basilar artery and proximal left P2 segment. 4. Mild narrowing of the pre cavernous left ICA measuring 60% relative to the more distal vessel. 5. No significant stenosis in the neck. 6. Multiple bilateral pulmonary nodules measuring up to 5 mm. These  are likely inflammatory. Recommend CT of the chest without contrast for follow-up in 2-3 months. 7. Aortic Atherosclerosis (ICD10-I70.0) and Emphysema (ICD10-J43.9). These results were called by telephone at the time of interpretation on 09/15/2019 at 9:41 pm to provider [facility] , who verbally acknowledged these results. Electronically Signed   By: San Morelle M.D.   On: 09/15/2019 21:43   CT Angio Neck W and/or Wo Contrast  Result Date: 09/15/2019 CLINICAL DATA:  New onset left-sided weakness. EXAM: CT ANGIOGRAPHY HEAD AND NECK TECHNIQUE: Multidetector CT imaging of the head and neck was performed using the standard protocol during bolus administration of intravenous contrast. Multiplanar CT image reconstructions and MIPs were obtained to evaluate the vascular anatomy. Carotid stenosis measurements (when applicable) are obtained utilizing NASCET criteria, using the distal internal carotid diameter as the denominator. CONTRAST:  164mL OMNIPAQUE IOHEXOL 350 MG/ML SOLN COMPARISON:  CT head without contrast 09/15/2019. CTA head and neck 06/27/2017 FINDINGS: CTA NECK FINDINGS Aortic arch: Dense atherosclerotic calcifications are again seen at the aortic arch and origins the great vessels without significant stenosis. No significant interval change is present. Right carotid system: The right common carotid artery is within normal limits. Bifurcation is unremarkable. Cervical right ICA is  normal. Left carotid system: Left common carotid artery is somewhat irregular without focal stenosis. Minimal calcifications present bifurcation without significant stenosis. Cervical left ICA is normal. Vertebral arteries: The right vertebral artery is the dominant vessel. The left vertebral artery is hypoplastic. Segmental stenoses are present in the left vertebral artery in the neck. Moderate stenosis is present at C2, C4, and C6-7. Skeleton: Cervical spine is fused C2-6. Slight adjacent level anterolisthesis at C7-T1 is stable. Other neck: Soft tissues of the neck are otherwise unremarkable. Upper chest: Centrilobular emphysematous changes are present. Paraseptal emphysematous changes are present as well. Bilateral ill-defined pulmonary nodules measure up to 5.5 mm on the right and 5.8 cm on the left. Thoracic inlet is within normal limits. Review of the MIP images confirms the above findings CTA HEAD FINDINGS Anterior circulation: Atherosclerotic calcifications are present within the cavernous internal carotid arteries bilaterally. Mild pre cavernous left ICA narrowing is present measuring 60% relative to the more distal vessel. ICA termini are within normal limits. The A1 and M1 segments are normal. Anterior communicating artery is patent. MCA bifurcations are intact. Early bifurcation is present on the right, a normal variant. ACA and MCA branch vessels are within normal limits. Posterior circulation: High-grade stenosis is present in the hypoplastic left vertebral artery at the dural margin. The right vertebral artery is within normal limits. Vertebrobasilar junction is normal. Mild narrowing is present in the proximal basilar artery. Basilar artery is otherwise within normal limits. Both posterior cerebral arteries originate from the basilar tip. Mild narrowing is present in the proximal left P2 segment. Branch vessels are within normal limits bilaterally. Venous sinuses: Dural sinuses are patent. The  straight sinus and deep cerebral veins are intact. Cortical veins are unremarkable. Anatomic variants: None Review of the MIP images confirms the above findings IMPRESSION: 1. No emergent large vessel occlusion. 2. 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 Multiple segmental stenoses are present in the left vertebral artery throughout the neck. 3. Mild narrowing of the proximal basilar artery and proximal left P2 segment. 4. Mild narrowing of the pre cavernous left ICA measuring 60% relative to the more distal vessel. 5. No significant stenosis in the neck. 6. Multiple bilateral pulmonary nodules measuring up to 5 mm.  These are likely inflammatory. Recommend CT of the chest without contrast for follow-up in 2-3 months. 7. Aortic Atherosclerosis (ICD10-I70.0) and Emphysema (ICD10-J43.9). These results were called by telephone at the time of interpretation on 09/15/2019 at 9:41 pm to provider [facility] , who verbally acknowledged these results. Electronically Signed   By: San Morelle M.D.   On: 09/15/2019 21:43   MR ANGIO HEAD WO CONTRAST  Result Date: 09/16/2019 CLINICAL DATA:  Left-sided weakness, acute onset EXAM: MRI HEAD WITHOUT CONTRAST MRA HEAD WITHOUT CONTRAST TECHNIQUE: Multiplanar, multiecho pulse sequences of the brain and surrounding structures were obtained without intravenous contrast. Angiographic images of the head were obtained using MRA technique without contrast. COMPARISON:  None. FINDINGS: MRI HEAD FINDINGS BRAIN: There is no acute infarct, acute hemorrhage or extra-axial collection. The white matter signal is normal for the patient's age. There is generalized atrophy without lobar predilection. Blood-sensitive sequences show no chronic microhemorrhage or superficial siderosis. The midline structures are normal. SKULL AND UPPER CERVICAL SPINE: The visualized skull base, calvarium, upper cervical spine and extracranial soft tissues are normal.  SINUSES/ORBITS: No fluid levels or advanced mucosal thickening. No mastoid or middle ear effusion. The orbits are normal. MRA HEAD FINDINGS POSTERIOR CIRCULATION: --Basilar artery: Normal. --Posterior cerebral arteries: Normal. Both originate from the basilar artery. --Superior cerebellar arteries: Normal. --Inferior cerebellar arteries: Normal anterior and posterior inferior cerebellar arteries. ANTERIOR CIRCULATION: --Intracranial internal carotid arteries: Normal. --Anterior cerebral arteries: Normal. Both A1 segments are present. Patent anterior communicating artery. --Middle cerebral arteries: Normal. --Posterior communicating arteries: Present on the right, absent on the left. IMPRESSION: 1. No acute intracranial abnormality. 2. Generalized atrophy without lobar predilection. 3. Normal intracranial MRA. Electronically Signed   By: Ulyses Jarred M.D.   On: 09/16/2019 22:21   MR BRAIN WO CONTRAST  Result Date: 09/16/2019 CLINICAL DATA:  Left-sided weakness, acute onset EXAM: MRI HEAD WITHOUT CONTRAST MRA HEAD WITHOUT CONTRAST TECHNIQUE: Multiplanar, multiecho pulse sequences of the brain and surrounding structures were obtained without intravenous contrast. Angiographic images of the head were obtained using MRA technique without contrast. COMPARISON:  None. FINDINGS: MRI HEAD FINDINGS BRAIN: There is no acute infarct, acute hemorrhage or extra-axial collection. The white matter signal is normal for the patient's age. There is generalized atrophy without lobar predilection. Blood-sensitive sequences show no chronic microhemorrhage or superficial siderosis. The midline structures are normal. SKULL AND UPPER CERVICAL SPINE: The visualized skull base, calvarium, upper cervical spine and extracranial soft tissues are normal. SINUSES/ORBITS: No fluid levels or advanced mucosal thickening. No mastoid or middle ear effusion. The orbits are normal. MRA HEAD FINDINGS POSTERIOR CIRCULATION: --Basilar artery: Normal.  --Posterior cerebral arteries: Normal. Both originate from the basilar artery. --Superior cerebellar arteries: Normal. --Inferior cerebellar arteries: Normal anterior and posterior inferior cerebellar arteries. ANTERIOR CIRCULATION: --Intracranial internal carotid arteries: Normal. --Anterior cerebral arteries: Normal. Both A1 segments are present. Patent anterior communicating artery. --Middle cerebral arteries: Normal. --Posterior communicating arteries: Present on the right, absent on the left. IMPRESSION: 1. No acute intracranial abnormality. 2. Generalized atrophy without lobar predilection. 3. Normal intracranial MRA. Electronically Signed   By: Ulyses Jarred M.D.   On: 09/16/2019 22:21   ECHOCARDIOGRAM COMPLETE  Result Date: 09/16/2019    ECHOCARDIOGRAM REPORT   Patient Name:   Richard [name] Date of Exam: 09/16/2019 Medical Rec #:  [id number]      Height:       72.0 in Accession #:    [id number]     Weight:  179.0 lb Date of Birth:  1945-12-13      BSA:          2.032 m Patient Age:    49 years       BP:           154/89 mmHg Patient Gender: M              HR:           77 bpm. Exam Location:  [facility] Procedure: 2D Echo Indications:    TIA 435.9 / G45.9  History:        Patient has no prior history of Echocardiogram examinations,                 most recent 06/28/2017. CAD, TIA, Arrythmias:Atrial Flutter,                 Signs/Symptoms:Chest Pain; Risk Factors:Hypertension, Diabetes,                 Dyslipidemia and Current Smoker.  Sonographer:    [name] RDCS (AE) Referring Phys: [id number] ASIA B [place]  1. Left ventricular ejection fraction, by estimation, is 60 to 65%. The left ventricle has normal function. The left ventricle has no regional wall motion abnormalities. There is mild left ventricular hypertrophy. Left ventricular diastolic parameters are consistent with Grade I diastolic dysfunction (impaired relaxation). Elevated left ventricular end-diastolic pressure.   2. Right ventricular systolic function is normal. The right ventricular size is normal. There is normal pulmonary artery systolic pressure.  3. The mitral valve is grossly normal. Trivial mitral valve regurgitation.  4. The aortic valve is tricuspid. Aortic valve regurgitation is not visualized. Mild aortic valve sclerosis is present, with no evidence of aortic valve stenosis.  5. The inferior vena cava is normal in size with greater than 50% respiratory variability, suggesting right atrial pressure of 3 mmHg. FINDINGS  Left Ventricle: Left ventricular ejection fraction, by estimation, is 60 to 65%. The left ventricle has normal function. The left ventricle has no regional wall motion abnormalities. The left ventricular internal cavity size was normal in size. There is  mild left ventricular hypertrophy. Left ventricular diastolic parameters are consistent with Grade I diastolic dysfunction (impaired relaxation). Elevated left ventricular end-diastolic pressure. Right Ventricle: The right ventricular size is normal. No increase in right ventricular wall thickness. Right ventricular systolic function is normal. There is normal pulmonary artery systolic pressure. The tricuspid regurgitant velocity is 1.75 m/s, and  with an assumed right atrial pressure of 3 mmHg, the estimated right ventricular systolic pressure is 42.7 mmHg. Left Atrium: Left atrial size was normal in size. Right Atrium: Right atrial size was normal in size. Pericardium: There is no evidence of pericardial effusion. Mitral Valve: The mitral valve is grossly normal. Trivial mitral valve regurgitation. Tricuspid Valve: The tricuspid valve is grossly normal. Tricuspid valve regurgitation is trivial. Aortic Valve: The aortic valve is tricuspid. Aortic valve regurgitation is not visualized. Mild aortic valve sclerosis is present, with no evidence of aortic valve stenosis. Pulmonic Valve: The pulmonic valve was normal in structure. Pulmonic valve  regurgitation is not visualized. Aorta: The aortic root and ascending aorta are structurally normal, with no evidence of dilitation. Venous: The inferior vena cava is normal in size with greater than 50% respiratory variability, suggesting right atrial pressure of 3 mmHg. IAS/Shunts: The interatrial septum was not well visualized.  LEFT VENTRICLE PLAX 2D LVIDd:         4.71 cm  Diastology LVIDs:         3.65 cm  LV e' lateral:   5.98 cm/s LV PW:         1.37 cm  LV E/e' lateral: 17.4 LV IVS:        1.18 cm  LV e' medial:    5.98 cm/s LVOT diam:     2.00 cm  LV E/e' medial:  17.4 LVOT Area:     3.14 cm  RIGHT VENTRICLE RV S prime:     18.80 cm/s TAPSE (M-mode): 3.3 cm LEFT ATRIUM             Index       RIGHT ATRIUM           Index LA diam:        3.60 cm 1.77 cm/m  RA Area:     17.30 cm LA Vol (A2C):   49.0 ml 24.11 ml/m RA Volume:   50.20 ml  24.70 ml/m LA Vol (A4C):   36.1 ml 17.76 ml/m LA Biplane Vol: 46.0 ml 22.63 ml/m   AORTA Ao Root diam: 3.10 cm MITRAL VALVE                TRICUSPID VALVE MV Area (PHT): 3.61 cm     TR Peak grad:   12.2 mmHg MV Decel Time: 210 msec     TR Vmax:        175.00 cm/s MV E velocity: 104.00 cm/s MV A velocity: 102.00 cm/s  SHUNTS MV E/A ratio:  1.02         Systemic Diam: 2.00 cm Lyman Bishop MD Electronically signed by Lyman Bishop MD Signature Date/Time: 09/16/2019/1:31:31 PM    Final    CT HEAD CODE STROKE WO CONTRAST  Result Date: 09/15/2019 CLINICAL DATA:  Code stroke. New onset left-sided weakness beginning 1 hour ago. EXAM: CT HEAD WITHOUT CONTRAST TECHNIQUE: Contiguous axial images were obtained from the base of the skull through the vertex without intravenous contrast. COMPARISON:  CT head without contrast/6/19 FINDINGS: Brain: No acute infarct, hemorrhage, or mass lesion is present. Mild white matter changes are stable, and likely within normal limits for age. Basal ganglia are intact. Insular ribbon is normal bilaterally. The ventricles are of normal size. No  significant extraaxial fluid collection is present. The brainstem and cerebellum are within normal limits. Vascular: Atherosclerotic calcifications are present within the cavernous internal carotid arteries bilaterally. No hyperdense vessel is present. Skull: Calvarium is intact. No focal lytic or blastic lesions are present. No significant extracranial soft tissue lesion is present. Sinuses/Orbits: The paranasal sinuses and mastoid air cells are clear. The globes and orbits are within normal limits. ASPECTS [facility] Stroke Program Early CT Score) - Ganglionic level infarction (caudate, lentiform nuclei, internal capsule, insula, M1-M3 cortex): 7/7 - Supraganglionic infarction (M4-M6 cortex): 3/3 Total score (0-10 with 10 being normal): 10/10 IMPRESSION: 1. Stable mild white matter disease. 2. No acute intracranial abnormality or significant interval change. 3. ASPECTS is 10/10. These results were called by telephone at the time of interpretation on 09/15/2019 at 9:12 pm to provider [facility] , who verbally acknowledged these results. Electronically Signed   By: San Morelle M.D.   On: 09/15/2019 21:13     EKG: normal EKG, normal sinus rhythm.   Physical Examination: Temp:  [97.6 F (36.4 C)-98.4 F (36.9 C)] 97.6 F (36.4 C) (07/26 1227) Pulse Rate:  [58-90] 80 (07/26 1227) Resp:  [16-18] 18 (07/26 1227) BP: (146-180)/(80-102) 180/92 (07/26 1227)  SpO2:  [97 %-99 %] 98 % (07/26 1227) Weight:  [86.4 kg] 86.4 kg (07/25 2119)  General - Well nourished, well developed, in no apparent distress.  Ophthalmologic - fundi not visualized due to noncooperation.  Cardiovascular - Regular rate and rhythm.  Mental Status -  Level of arousal and orientation to time, place, and person were intact. Language including expression, naming, repetition, comprehension was assessed and found intact. Fund of Knowledge was assessed and was intact.  Cranial Nerves II - XII - II - Visual field intact  OU. III, IV, VI - Extraocular movements intact. V - Facial sensation intact bilaterally. VII - Facial movement intact bilaterally. VIII - Hearing & vestibular intact bilaterally. X - Palate elevates symmetrically. XI - Chin turning & shoulder shrug intact bilaterally. XII - Tongue protrusion intact.  Motor Strength - The patient's strength was normal in all extremities and pronator drift was absent.  Bulk was normal and fasciculations were absent.   Motor Tone - Muscle tone was assessed at the neck and appendages and was normal.  Reflexes - The patient's reflexes were symmetrical in all extremities and he had no pathological reflexes.  Sensory - Light touch, temperature/pinprick were assessed and were symmetrical.    Coordination - The patient had normal movements in the hands and feet with no ataxia or dysmetria.  Tremor was absent.  Gait and Station - deferred.   Assessment:  Richard [name] is a 74 y.o. male with history of CAD, AF s/p ablation, recent GIB presenting to [facility] ED with L sided weaness. He did not receive IV t-PA due to recent GIB, pt refused d/t low NIHSS.   Syncope vs. Presyncope - ? orthostatic hypotension vs. Arrhythmia ?  Presented with lightheadedness, diaphoresis, generalized weakness, BP 80/50   Code Stroke CT head No acute stroke. ASPECTS 10.     CTA head & neck no LVO. 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 Proximal BA and proximal L P2 w/ mild narrowing. L ICA pre-cavernous 60% stenosis.   MRI  No acute abnormality. Atrophy.  MRA  Unremarkable   2D Echo EF 60-65%. No source of embolus   LDL 94  HgbA1c 10.6   SCDs for VTE prophylaxis  aspirin 325 mg daily prior to admission, now on aspirin 325 mg daily. Further antiplatelet use per urology.   Therapy recommendations:  No therapy needs  Disposition:  Return home  Atrial Flutter  S/p ablation 2008 Caryl Comes)  Home  anticoagulation:  none   In 2019 recommended DOAC. Pt did not want to take warfarin. Started on Eliquis but pt stopped taking d/t cost. 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 Changed aspirin to aggrenox (hx myalgias on Plavix). recommended DOAC. Pt did not want to take warfarin.  Hypertension  Elevated 170-180s . BP goal normotensive . Check orthostatic vitals   Hyperlipidemia  Home meds:  lipitor 10  Now on lipitor 40  LDL 94, goal < 70  Hx statin intolerance (myalgias). adamantly refused statins. Ongoing PSCK9 inhibitor discussions  since 2019, PCP got him to take Lipitor and was tolerating   Continue statin at discharge  Diabetes type II, Uncontrolled  HgbA1c 10.6, goal < 7.0  CBGs  SSI  Close PCP follow up for better DM control  Tobacco abuse  Current smoker  Smoking cessation counseling provided  Pt will think about it  Other Stroke Risk Factors  Advanced age  Coronary artery disease s/p MI w/ DES 2007, 2019  Other Active Problems  AKI, unclear hx CKD.    Pulmonary nodules on imaging. rescan in 2-3 mos  Hospital day # 2   Thank you for allowing [place] to participate in the care of this patient. Neurology will sign off. Please call with questions. No neuro follow up needed at this time. Thanks for the consult.  Rosalin Hawking, MD PhD Stroke Neurology 09/17/2019 4:06 PM    To contact Stroke Continuity provider, please refer to [email or web address] After hours, contact General Neurology

## 2019-09-17 NOTE — Progress Notes (Signed)
Telemetry monitoring unit called and states "Pt has 16 run and 5 runs of Vtach respectively"

## 2019-09-17 NOTE — Telephone Encounter (Signed)
appt made for 8/13  Will have appt at time of dc papers given at hospital .

## 2019-09-17 NOTE — Progress Notes (Signed)
Nutrition Brief Note  Patient identified on the Malnutrition Screening Tool (MST) Report  Wt Readings from Last 15 Encounters:  09/16/19 86.4 kg  07/09/19 82.6 kg  01/05/19 85.7 kg  01/30/18 91.3 kg  10/25/17 85.7 kg  10/17/17 87.5 kg  08/16/17 87.1 kg  06/27/17 86.7 kg  08/27/14 88 kg  08/19/14 88.2 kg  11/14/12 88.9 kg   Patient with PMH significant for DM, HTN, HLD, transient ischemic attack, and CAD. Presents this admission with TIA. Pt denies loss in appetite PTA. Typically eats 2-3 meals daily. Endorses a gradual weight loss three years ago but weight has remained stable over the last two. Skin intact.   Body mass index is 25.83 kg/m.   Current diet order is heart healthy/carb modified, patient is consuming approximately 100% of meals at this time. Labs and medications reviewed.   No nutrition interventions warranted at this time. If nutrition issues arise, please consult RD.   Mariana Single RD, LDN Clinical Nutrition Pager listed in [place]

## 2019-09-20 ENCOUNTER — Encounter: Payer: Self-pay | Admitting: Urology

## 2019-09-20 ENCOUNTER — Other Ambulatory Visit: Payer: Self-pay

## 2019-09-20 ENCOUNTER — Ambulatory Visit (INDEPENDENT_AMBULATORY_CARE_PROVIDER_SITE_OTHER): Payer: Medicare Other | Admitting: Urology

## 2019-09-20 VITALS — BP 185/64 | HR 72 | Temp 98.0°F | Ht 72.0 in | Wt 179.0 lb

## 2019-09-20 DIAGNOSIS — N3289 Other specified disorders of bladder: Secondary | ICD-10-CM | POA: Insufficient documentation

## 2019-09-20 LAB — URINALYSIS, MICROSCOPIC ONLY
Bacteria, UA: NONE SEEN
Epithelial Cells (non renal): NONE SEEN /hpf (ref 0–10)
RBC, Urine: >30 /hpf — AB (ref 0–2)
Renal Epithel, UA: NONE SEEN /hpf
WBC, UA: NONE SEEN /hpf (ref 0–5)

## 2019-09-20 MED ORDER — CIPROFLOXACIN HCL 500 MG PO TABS
500.0000 mg | ORAL_TABLET | Freq: Once | ORAL | Status: AC
  Administered 2019-09-20: 500 mg via ORAL

## 2019-09-20 NOTE — Progress Notes (Signed)
Urological Symptom Review  Patient is experiencing the following symptoms: Frequent urination Burning/pain with urination Get up at night to urinate Stream starts and stops Blood in urine Erection problems (male only)  Kidney stones   Review of Systems  Gastrointestinal (upper)  : Negative for upper GI symptoms  Gastrointestinal (lower) : Constipation  Constitutional : Negative for symptoms  Skin: Negative for skin symptoms  Eyes: Negative for eye symptoms  Ear/Nose/Throat : Sinus problems  Hematologic/Lymphatic: Easy bruising  Cardiovascular : Negative for cardiovascular symptoms  Respiratory : Shortness of breath  Endocrine: Negative for endocrine symptoms  Musculoskeletal: Negative for musculoskeletal symptoms  Neurological: Negative for neurological symptoms  Psychologic: Negative for psychiatric symptoms

## 2019-09-20 NOTE — H&P (View-Only) (Signed)
09/20/2019 9:15 AM   Richard [name] [birth year] [id number]  Referring provider: Wannetta Sender, FNP [facility] [address] [place],  [place] [address]  Gross hematuria  HPI: Richard [name] is a 74yo here for evaluation of gross hematuria and new bladder mass. 1 week ago he developed gross hematuria and presented to ER. CT from 09/16/2019 shows a 2.7cm bladder mass. He has a 60pk year smoking hx.    PMH: Past Medical History:  Diagnosis Date  . CAD (coronary artery disease)    a. 2007 - DES x 2 proximal to mid RCA b. 09/2017: DES to mid LAD and OM2. Patent stents along RCA.   . Essential hypertension   . History of atrial flutter    Status post ablation 2008 - Dr. Caryl Comes  . History of transient ischemic attack (TIA)   . Mixed hyperlipidemia    Statin intolerance  . Pneumonia   . Type 2 diabetes mellitus (HCC)    A1C 11.5 06/2017    Surgical History: Past Surgical History:  Procedure Laterality Date  . BALLOON ANGIOPLASTY, ARTERY    . CARDIAC ELECTROPHYSIOLOGY MAPPING AND ABLATION    . CARPAL TUNNEL RELEASE Bilateral   . cervical neck fusion     x 4  . CORONARY STENT INTERVENTION N/A 10/17/2017   Procedure: CORONARY STENT INTERVENTION;  Surgeon: Martinique, Peter M, MD;  Location: [place] [facility];  Service: Cardiovascular;  Laterality: N/A;  . CORONARY STENT PLACEMENT     x 2  . LEFT HEART CATH AND CORONARY ANGIOGRAPHY N/A 10/17/2017   Procedure: LEFT HEART CATH AND CORONARY ANGIOGRAPHY;  Surgeon: Martinique, Peter M, MD;  Location: [place] [facility];  Service: Cardiovascular;  Laterality: N/A;  . ROTATOR CUFF REPAIR Right    x 4  . TIBIA FRACTURE SURGERY Left     Home Medications:  Allergies as of 09/20/2019      Reactions   Statins    Leg pain      Medication List       Accurate as of September 20, 2019  9:15 AM. If you have any questions, ask your nurse or doctor.        atorvastatin 10 MG tablet Commonly known as:  LIPITOR Take 10 mg by mouth daily.   glipiZIDE 10 MG 24 hr tablet Commonly known as: GLUCOTROL XL Take 10 mg by mouth 2 (two) times daily.   irbesartan 300 MG tablet Commonly known as: AVAPRO Take 300 mg by mouth daily.   isosorbide mononitrate 30 MG 24 hr tablet Commonly known as: IMDUR Take 1 tablet (30 mg total) by mouth at bedtime.   metoprolol tartrate 100 MG tablet Commonly known as: LOPRESSOR Take 1 tablet (100 mg total) by mouth 2 (two) times daily.   nitroGLYCERIN 0.4 MG SL tablet Commonly known as: NITROSTAT Place 1 tablet (0.4 mg total) under the tongue every 5 (five) minutes as needed for chest pain.   pioglitazone 30 MG tablet Commonly known as: ACTOS Take 30 mg by mouth daily.   [place] 100-33 UNT-MCG/ML Sopn Generic drug: Insulin Glargine-Lixisenatide Inject into the skin.       Allergies:  Allergies  Allergen Reactions  . Statins     Leg pain    Family History: Family History  Problem Relation Age of Onset  . Diabetes Mother   . Heart disease Mother   . Heart disease Brother   . Emphysema Father   .  Lung cancer Brother        x 2  . Colon cancer Neg Hx   . Esophageal cancer Neg Hx   . Rectal cancer Neg Hx   . Stomach cancer Neg Hx   . Prostate cancer Neg Hx     Social History:  reports that he has been smoking cigarettes. He has a 54.00 pack-year smoking history. He has never used smokeless tobacco. He reports that he does not drink alcohol and does not use drugs.  ROS: All other review of systems were reviewed and are negative except what is noted above in HPI  Physical Exam: BP (!) 185/64   Pulse 72   Temp 98 F (36.7 C)   Ht 6' (1.829 m)   Wt 179 lb (81.2 kg)   BMI 24.28 kg/m   Constitutional:  Alert and oriented, No acute distress. HEENT: [place] AT, moist mucus membranes.  Trachea midline, no masses. Cardiovascular: No clubbing, cyanosis, or edema. Respiratory: Normal respiratory effort, no increased work of breathing. GI:  Abdomen is soft, nontender, nondistended, no abdominal masses GU: No CVA tenderness.  Lymph: No cervical or inguinal lymphadenopathy. Skin: No rashes, bruises or suspicious lesions. Neurologic: Grossly intact, no focal deficits, moving all 4 extremities. Psychiatric: Normal mood and affect.  Laboratory Data: Lab Results  Component Value Date   WBC 7.5 09/16/2019   HGB 13.0 09/17/2019   HCT 40.1 09/17/2019   MCV 89.1 09/16/2019   PLT 170 09/16/2019    Lab Results  Component Value Date   CREATININE 1.23 09/17/2019    No results found for: PSA  No results found for: TESTOSTERONE  Lab Results  Component Value Date   HGBA1C 10.6 (H) 09/16/2019    Urinalysis    Component Value Date/Time   COLORURINE AMBER (A) 09/15/2019 2324   APPEARANCEUR CLOUDY (A) 09/15/2019 2324   LABSPEC 1.042 (H) 09/15/2019 2324   PHURINE 5.0 09/15/2019 2324   GLUCOSEU 50 (A) 09/15/2019 2324   HGBUR LARGE (A) 09/15/2019 2324   BILIRUBINUR NEGATIVE 09/15/2019 2324   KETONESUR NEGATIVE 09/15/2019 2324   PROTEINUR 100 (A) 09/15/2019 2324   UROBILINOGEN 0.2 01/04/2007 1000   NITRITE NEGATIVE 09/15/2019 2324   LEUKOCYTESUR NEGATIVE 09/15/2019 2324    Lab Results  Component Value Date   BACTERIA NONE SEEN 09/15/2019    Pertinent Imaging: CT 09/16/2019: Images reviewed and discussed with the patient Results for orders placed during the hospital encounter of 11/14/12  DG Abd 1 View  Narrative CLINICAL DATA:  Abdominal pain and distention with nausea.  EXAM: ABDOMEN - 1 VIEW  COMPARISON:  None.  FINDINGS: The bowel gas pattern is normal. No radio-opaque calculi or other significant radiographic abnormality are seen.  IMPRESSION: Negative.   Electronically Signed By: Inge Rise M.D. On: 11/16/2012 21:57  No results found for this or any previous visit.  No results found for this or any previous visit.  No results found for this or any previous visit.  No results found  for this or any previous visit.  No results found for this or any previous visit.  No results found for this or any previous visit.  No results found for this or any previous visit.     Cystoscopy Procedure Note  Patient identification was confirmed, informed consent was obtained, and patient was prepped using Betadine solution.  Lidocaine jelly was administered per urethral meatus.     Pre-Procedure: - Inspection reveals a normal caliber ureteral meatus.  Procedure: The flexible cystoscope  was introduced without difficulty - No urethral strictures/lesions are present. - Enlarged prostate  - Normal bladder neck - Bilateral ureteral orifices identified - 3cm posterior wall bladder tumor  - No bladder stones - No trabeculation     Post-Procedure: - Patient tolerated the procedure well     No follow-ups on file.  [name], MD  Assessment & Plan:    1. Bladder mass -We discussed the natural hx of bladder masses and the likelihood this represents a malignancy. We discussed the treatment options includuing transurethral resection and the patient wishes to proceed with surgery. Risks/benefits/alternatives dsicussed - Urine Microscopic   No follow-ups on file.  [name], MD  [facility] Urology Deport

## 2019-09-20 NOTE — Patient Instructions (Signed)
Transurethral Resection of Bladder Tumor  Transurethral resection of a bladder tumor is the removal (resection) of a cancerous growth (tumor) on the inside wall of the bladder. The bladder is the organ that holds urine. The tumor is removed through the tube that carries urine out of the body (urethra). In a transurethral resection, a thin telescope with a light, a tiny camera, and an electric cutting edge (resectoscope) is passed through the urethra. In men, the opening of the urethra is at the end of the penis. In women, it is just above the opening of the vagina. Tell a health care provider about:  Any allergies you have.  All medicines you are taking, including vitamins, herbs, eye drops, creams, and over-the-counter medicines.  Any problems you or family members have had with anesthetic medicines.  Any blood disorders you have.  Any surgeries you have had.  Any medical conditions you have.  Any recent urinary tract infections you have had.  Whether you are pregnant or may be pregnant. What are the risks? Generally, this is a safe procedure. However, problems may occur, including:  Infection.  Bleeding.  Allergic reactions to medicines.  Damage to nearby structures or organs, such as: ? The urethra. ? The tubes that drain urine from the kidneys into the bladder (ureters).  Pain and burning during urination.  Difficulty urinating due to partial blockage of the urethra.  Inability to urinate (urinary retention). What happens before the procedure? Staying hydrated Follow instructions from your health care provider about hydration, which may include:  Up to 2 hours before the procedure - you may continue to drink clear liquids, such as water, clear fruit juice, black coffee, and plain tea.  Eating and drinking restrictions Follow instructions from your health care provider about eating and drinking, which may include:  8 hours before the procedure - stop eating heavy  meals or foods, such as meat, fried foods, or fatty foods.  6 hours before the procedure - stop eating light meals or foods, such as toast or cereal.  6 hours before the procedure - stop drinking milk or drinks that contain milk.  2 hours before the procedure - stop drinking clear liquids. Medicines Ask your health care provider about:  Changing or stopping your regular medicines. This is especially important if you are taking diabetes medicines or blood thinners.  Taking medicines such as aspirin and ibuprofen. These medicines can thin your blood. Do not take these medicines unless your health care provider tells you to take them.  Taking over-the-counter medicines, vitamins, herbs, and supplements. Tests You may have exams or tests, including:  Physical exam.  Blood tests.  Urine tests.  Electrocardiogram (ECG). This test measures the electrical activity of the heart. General instructions  Plan to have someone take you home from the hospital or clinic.  Ask your health care provider how your surgical site will be marked or identified.  Ask your health care provider what steps will be taken to help prevent infection. These may include: ? Washing skin with a germ-killing soap. ? Taking antibiotic medicine. What happens during the procedure?  An IV will be inserted into one of your veins.  You will be given one or more of the following: ? A medicine to help you relax (sedative). ? A medicine to make you fall asleep (general anesthetic). ? A medicine that is injected into your spine to numb the area below and slightly above the injection site (spinal anesthetic).  Your legs will be  placed in foot rests (stirrups) so that your legs are apart and your knees are bent.  The resectoscope will be passed through your urethra and into your bladder.  The part of your bladder that is affected by the tumor will be resected using the cutting edge of the resectoscope.  The  resectoscope will be removed.  A thin, flexible tube (catheter) will be passed through your urethra and into your bladder. The catheter will drain urine into a bag outside of your body. ? Fluid may be passed through the catheter to keep the catheter open. The procedure may vary among health care providers and hospitals. What happens after the procedure?  Your blood pressure, heart rate, breathing rate, and blood oxygen level will be monitored until you leave the hospital or clinic.  You may continue to receive fluids and medicines through an IV.  You will have some pain. You will be given pain medicine to relieve pain.  You will have a catheter to drain your urine. ? You will have blood in your urine. Your catheter may be kept in until your urine is clear. ? The amount of urine will be monitored. If necessary, your bladder may be rinsed out (irrigated) by passing fluid through your catheter.  You will be encouraged to walk around as soon as possible.  You may have to wear compression stockings. These stockings help to prevent blood clots and reduce swelling in your legs.  Do not drive for 24 hours if you were given a sedative during your procedure. Summary  Transurethral resection of a bladder tumor is the removal (resection) of a cancerous growth (tumor) on the inside wall of the bladder.  To do this procedure, your health care provider uses a thin telescope with a light, a tiny camera, and an electric cutting edge (resectoscope).  Follow your health care provider's instructions. You may need to stop or change certain medicines, and you may be told to stop eating and drinking several hours before the procedure.  Your blood pressure, heart rate, breathing rate, and blood oxygen level will be monitored until you leave the hospital or clinic.  You may have to wear compression stockings. These stockings help to prevent blood clots and reduce swelling in your legs. This information is  not intended to replace advice given to you by your health care provider. Make sure you discuss any questions you have with your health care provider. Document Revised: 09/09/2017 Document Reviewed: 09/09/2017 Elsevier Patient Education  [place]. Bladder Cancer  Bladder cancer is an abnormal growth of tissue in the bladder. The bladder is the balloon-like sac in the pelvis. It collects and stores urine that comes from the kidneys through the ureters. The bladder wall is made of layers. If cancer spreads into these layers and through the wall of the bladder, it becomes more difficult to treat. What are the causes? The cause of this condition is not known. What increases the risk? The following factors may make you more likely to develop this condition:  Smoking.  Workplace risks (occupational exposures), such as rubber, leather, textile, dyes, chemicals, and paint.  Being white.  Your age. Most people with bladder cancer are over the age of 71.  Being male.  Having chronic bladder inflammation.  Having a personal history of bladder cancer.  Having a family history of bladder cancer (heredity).  Having had chemotherapy or radiation therapy to the pelvis.  Having been exposed to arsenic. What are the signs or symptoms?  Initial symptoms of this condition include:  Blood in the urine.  Painful urination.  Frequent bladder or urine infections.  Increase in urgency and frequency of urination. Advanced symptoms of this condition include:  Not being able to urinate.  Low back pain on one side.  Loss of appetite.  Weight loss.  Fatigue.  Swelling in the feet.  Bone pain. How is this diagnosed? This condition is diagnosed based on your medical history, a physical exam, urine tests, lab tests, imaging tests, and your symptoms. You may also have other tests or procedures done, such as:  A narrow tube being inserted into your bladder through your urethra  (cystoscopy) in order to view the lining of your bladder for tumors.  A biopsy to sample the tumor to see if cancer is present. If cancer is present, it will then be staged to determine its severity and extent. Staging is an assessment of:  The size of the tumor.  Whether the cancer has spread.  Where the cancer has spread. It is important to know how deeply into the bladder wall cancer has grown and whether cancer has spread to any other parts of your body. Staging may require blood tests or imaging tests, such as a CT scan, MRI, bone scan, or chest X-ray. How is this treated? Based on the stage of cancer, one treatment or a combination of treatments may be recommended. The most common forms of treatment are:  Surgery to remove the cancer. Procedures that may be done include transurethral resection and cystectomy.  Radiation therapy. This is high-energy X-rays or other particles. This is often used in combination with chemotherapy.  Chemotherapy. During this treatment, medicines are used to kill cancer cells.  Immunotherapy. This uses medicines to help your own immune system destroy cancer cells. Follow these instructions at home:  Take over-the-counter and prescription medicines only as told by your health care provider.  Maintain a healthy diet. Some of your treatments might affect your appetite.  Consider joining a support group. This may help you learn to cope with the stress of having bladder cancer.  Tell your cancer care team if you develop side effects. They may be able to recommend ways to relieve them.  Keep all follow-up visits as told by your health care provider. This is important. Where to find more information  American Cancer Society: www.cancer.[place] www.cancer.gov Contact a health care provider if:  You have symptoms of a urinary tract infection. These include: ? Fever. ? Chills. ? Weakness. ? Muscle aches. ? Abdominal  pain. ? Frequent and intense urge to urinate. ? Burning feeling in the bladder or urethra during urination. Get help right away if:  There is blood in your urine.  You cannot urinate.  You have severe pain or other symptoms that do not go away. Summary  Bladder cancer is an abnormal growth of tissue in the bladder.  This condition is diagnosed based on your medical history, a physical exam, urine tests, lab tests, imaging tests, and your symptoms.  Based on the stage of cancer, surgery, chemotherapy, or a combination of treatments may be recommended.  Consider joining a support group. This may help you learn to cope with the stress of having bladder cancer. This information is not intended to replace advice given to you by your health care provider. Make sure you discuss any questions you have with your health care provider. Document Revised: 01/21/2017 Document Reviewed: 01/13/2016 Elsevier Patient Education  [phone]  Elsevier Inc. ° °

## 2019-09-20 NOTE — Progress Notes (Signed)
09/20/2019 9:15 AM   Richard [name] 11/02/1945 [id number]  Referring provider: Wannetta Sender, FNP [facility] [address] [place],  [place] [address]  Gross hematuria  HPI: Richard [name] is a 74yo here for evaluation of gross hematuria and new bladder mass. 1 week ago he developed gross hematuria and presented to ER. CT from 09/16/2019 shows a 2.7cm bladder mass. He has a 60pk year smoking hx.    PMH: Past Medical History:  Diagnosis Date  . CAD (coronary artery disease)    a. 2007 - DES x 2 proximal to mid RCA b. 09/2017: DES to mid LAD and OM2. Patent stents along RCA.   . Essential hypertension   . History of atrial flutter    Status post ablation 2008 - Dr. Caryl Comes  . History of transient ischemic attack (TIA)   . Mixed hyperlipidemia    Statin intolerance  . Pneumonia   . Type 2 diabetes mellitus (HCC)    A1C 11.5 06/2017    Surgical History: Past Surgical History:  Procedure Laterality Date  . BALLOON ANGIOPLASTY, ARTERY    . CARDIAC ELECTROPHYSIOLOGY MAPPING AND ABLATION    . CARPAL TUNNEL RELEASE Bilateral   . cervical neck fusion     x 4  . CORONARY STENT INTERVENTION N/A 10/17/2017   Procedure: CORONARY STENT INTERVENTION;  Surgeon: Martinique, Peter M, MD;  Location: [facility];  Service: Cardiovascular;  Laterality: N/A;  . CORONARY STENT PLACEMENT     x 2  . LEFT HEART CATH AND CORONARY ANGIOGRAPHY N/A 10/17/2017   Procedure: LEFT HEART CATH AND CORONARY ANGIOGRAPHY;  Surgeon: Martinique, Peter M, MD;  Location: [facility];  Service: Cardiovascular;  Laterality: N/A;  . ROTATOR CUFF REPAIR Right    x 4  . TIBIA FRACTURE SURGERY Left     Home Medications:  Allergies as of 09/20/2019      Reactions   Statins    Leg pain      Medication List       Accurate as of September 20, 2019  9:15 AM. If you have any questions, ask your nurse or doctor.        atorvastatin 10 MG tablet Commonly known as:  LIPITOR Take 10 mg by mouth daily.   glipiZIDE 10 MG 24 hr tablet Commonly known as: GLUCOTROL XL Take 10 mg by mouth 2 (two) times daily.   irbesartan 300 MG tablet Commonly known as: AVAPRO Take 300 mg by mouth daily.   isosorbide mononitrate 30 MG 24 hr tablet Commonly known as: IMDUR Take 1 tablet (30 mg total) by mouth at bedtime.   metoprolol tartrate 100 MG tablet Commonly known as: LOPRESSOR Take 1 tablet (100 mg total) by mouth 2 (two) times daily.   nitroGLYCERIN 0.4 MG SL tablet Commonly known as: NITROSTAT Place 1 tablet (0.4 mg total) under the tongue every 5 (five) minutes as needed for chest pain.   pioglitazone 30 MG tablet Commonly known as: ACTOS Take 30 mg by mouth daily.   [place] 100-33 UNT-MCG/ML Sopn Generic drug: Insulin Glargine-Lixisenatide Inject into the skin.       Allergies:  Allergies  Allergen Reactions  . Statins     Leg pain    Family History: Family History  Problem Relation Age of Onset  . Diabetes Mother   . Heart disease Mother   . Heart disease Brother   . Emphysema Father   .  Lung cancer Brother        x 2  . Colon cancer Neg Hx   . Esophageal cancer Neg Hx   . Rectal cancer Neg Hx   . Stomach cancer Neg Hx   . Prostate cancer Neg Hx     Social History:  reports that he has been smoking cigarettes. He has a 54.00 pack-year smoking history. He has never used smokeless tobacco. He reports that he does not drink alcohol and does not use drugs.  ROS: All other review of systems were reviewed and are negative except what is noted above in HPI  Physical Exam: BP (!) 185/64   Pulse 72   Temp 98 F (36.7 C)   Ht 6' (1.829 m)   Wt 179 lb (81.2 kg)   BMI 24.28 kg/m   Constitutional:  Alert and oriented, No acute distress. HEENT: [place] AT, moist mucus membranes.  Trachea midline, no masses. Cardiovascular: No clubbing, cyanosis, or edema. Respiratory: Normal respiratory effort, no increased work of breathing. GI:  Abdomen is soft, nontender, nondistended, no abdominal masses GU: No CVA tenderness.  Lymph: No cervical or inguinal lymphadenopathy. Skin: No rashes, bruises or suspicious lesions. Neurologic: Grossly intact, no focal deficits, moving all 4 extremities. Psychiatric: Normal mood and affect.  Laboratory Data: Lab Results  Component Value Date   WBC 7.5 09/16/2019   HGB 13.0 09/17/2019   HCT 40.1 09/17/2019   MCV 89.1 09/16/2019   PLT 170 09/16/2019    Lab Results  Component Value Date   CREATININE 1.23 09/17/2019    No results found for: PSA  No results found for: TESTOSTERONE  Lab Results  Component Value Date   HGBA1C 10.6 (H) 09/16/2019    Urinalysis    Component Value Date/Time   COLORURINE AMBER (A) 09/15/2019 2324   APPEARANCEUR CLOUDY (A) 09/15/2019 2324   LABSPEC 1.042 (H) 09/15/2019 2324   PHURINE 5.0 09/15/2019 2324   GLUCOSEU 50 (A) 09/15/2019 2324   HGBUR LARGE (A) 09/15/2019 2324   BILIRUBINUR NEGATIVE 09/15/2019 2324   KETONESUR NEGATIVE 09/15/2019 2324   PROTEINUR 100 (A) 09/15/2019 2324   UROBILINOGEN 0.2 01/04/2007 1000   NITRITE NEGATIVE 09/15/2019 2324   LEUKOCYTESUR NEGATIVE 09/15/2019 2324    Lab Results  Component Value Date   BACTERIA NONE SEEN 09/15/2019    Pertinent Imaging: CT 09/16/2019: Images reviewed and discussed with the patient Results for orders placed during the hospital encounter of 11/14/12  DG Abd 1 View  Narrative CLINICAL DATA:  Abdominal pain and distention with nausea.  EXAM: ABDOMEN - 1 VIEW  COMPARISON:  None.  FINDINGS: The bowel gas pattern is normal. No radio-opaque calculi or other significant radiographic abnormality are seen.  IMPRESSION: Negative.   Electronically Signed By: Inge Rise M.D. On: 11/16/2012 21:57  No results found for this or any previous visit.  No results found for this or any previous visit.  No results found for this or any previous visit.  No results found  for this or any previous visit.  No results found for this or any previous visit.  No results found for this or any previous visit.  No results found for this or any previous visit.     Cystoscopy Procedure Note  Patient identification was confirmed, informed consent was obtained, and patient was prepped using Betadine solution.  Lidocaine jelly was administered per urethral meatus.     Pre-Procedure: - Inspection reveals a normal caliber ureteral meatus.  Procedure: The flexible cystoscope  was introduced without difficulty - No urethral strictures/lesions are present. - Enlarged prostate  - Normal bladder neck - Bilateral ureteral orifices identified - 3cm posterior wall bladder tumor  - No bladder stones - No trabeculation     Post-Procedure: - Patient tolerated the procedure well     No follow-ups on file.  [name], MD  Assessment & Plan:    1. Bladder mass -We discussed the natural hx of bladder masses and the likelihood this represents a malignancy. We discussed the treatment options includuing transurethral resection and the patient wishes to proceed with surgery. Risks/benefits/alternatives dsicussed - Urine Microscopic   No follow-ups on file.  [name], MD  [facility] [place]

## 2019-09-25 DIAGNOSIS — Z09 Encounter for follow-up examination after completed treatment for conditions other than malignant neoplasm: Secondary | ICD-10-CM | POA: Diagnosis not present

## 2019-09-25 DIAGNOSIS — D494 Neoplasm of unspecified behavior of bladder: Secondary | ICD-10-CM | POA: Diagnosis not present

## 2019-09-27 ENCOUNTER — Ambulatory Visit: Payer: Medicare Other | Admitting: Family Medicine

## 2019-09-27 ENCOUNTER — Encounter: Payer: Self-pay | Admitting: Family Medicine

## 2019-09-27 VITALS — BP 140/70 | HR 98 | Ht 72.0 in | Wt 178.8 lb

## 2019-09-27 DIAGNOSIS — I6529 Occlusion and stenosis of unspecified carotid artery: Secondary | ICD-10-CM

## 2019-09-27 DIAGNOSIS — R1013 Epigastric pain: Secondary | ICD-10-CM | POA: Diagnosis not present

## 2019-09-27 DIAGNOSIS — N3289 Other specified disorders of bladder: Secondary | ICD-10-CM

## 2019-09-27 DIAGNOSIS — I2510 Atherosclerotic heart disease of native coronary artery without angina pectoris: Secondary | ICD-10-CM | POA: Diagnosis not present

## 2019-09-27 DIAGNOSIS — E782 Mixed hyperlipidemia: Secondary | ICD-10-CM | POA: Diagnosis not present

## 2019-09-27 NOTE — Progress Notes (Addendum)
Cardiology Office Note  Date: 09/27/2019   ID: Richard [name], DOB 09-05-1945, MRN [id number]  PCP:  Wannetta Sender, FNP  Cardiologist:  Rozann Lesches, MD Electrophysiologist:  None   Chief Complaint: Follow-up CAD  History of Present Illness: Richard [name] is a 74 y.o. male with a history of CAD, HLD, history of atrial flutter.  Last saw [name] on 07/09/2019.  He did not report any definitive anginal symptoms or nitroglycerin use.  Had been experiencing more generalized discomfort in his epigastric area.  Have been using Tums frequently.  Occasionally felt lightheaded when drinking water.  No palpitations or syncope.  History of statin intolerances.  Declined Zetia in the past.  PCSK9 inhibitors were discussed.  Was placed on low-dose Lipitor by PCP and tolerating.  He was due for follow-up of carotid artery disease   Was hospitalized on 09/15/2019.  Discharge diagnosis left-sided weakness and slurred speech suspect TIA, hematuria, AKI, hypomagnesia, DM type II, atrial flutter status post ablation, pulmonary nodules.  Presented to the emergency room on 724 with complaint of feeling daily.  He was getting ready to eat supper had sudden onset of headache and diaphoresis.  Felt he was going to collapse.  Had generalized weakness and reported slow speech with slurring and feeling panicky.  He reported dyspnea and epigastric abdominal pain secondary to anxiety.  CT of the head no acute intracranial abnormality, MRI brain no acute intracranial abnormality. Normal intracranial MRA.  Echocardiogram grade 1 diastolic dysfunction, mild aortic valve sclerosis.  Neurology was consulted.  Recommended continuing aspirin and statin.  Was having hematuria.  Endoluminal mass of the left aspect of anterior bladder dome measuring 2.7 cm concerning for primary bowel or bladder malignancy.  Case was discussed with Dr. Jeffie Pollock urology.  He recommended outpatient follow-up with cystoscopy. Hemoglobin  was  13.  Acute kidney injury.  Creatinine was 2 on admission.  Prior to discharge it was 1.23.  Magnesium was low at 1.5 but was replaced.  Hemoglobin A1c was 10.6.    Patient is here today for post hospital follow-up.  He denies any significant neurological symptoms.  No focal neurologic deficits.  No slurred speech, blurred vision, unilateral weakness, or hemiplegia.  Denies any chest pain, pressure, tightness, neck, arm, back or jaw pain.  Denies any palpitations or arrhythmias, orthostatic symptoms or near syncope.  Denies any PND or orthopnea.  Denies any claudication-like symptoms, DVT or PE-like symptoms, or lower extremity edema.  He continues to smoke.  Blood pressure is elevated today at 140/70.  States his blood pressure is up and down depending on what is going on at home.  States sometimes it has been as low as systolic 370 systolic range.  He states after he smokes it seems to increase.  He states his hematuria has resolved.  Patient states he has pending bladder surgery for removal of a bladder tumor by Dr. Alyson Ingles urology.   Revised Cardiac Risk Index score of 1 = 0.9 % Perioperative risk of Major Cardiac event. Considered Low Risk. Patient is low risk from a cardiac stand point to undergo TURBT under general anesthesia by Dr Alyson Ingles.  Past Medical History:  Diagnosis Date  . CAD (coronary artery disease)    a. 2007 - DES x 2 proximal to mid RCA b. 09/2017: DES to mid LAD and OM2. Patent stents along RCA.   . Essential hypertension   . History of atrial flutter    Status post ablation 2008 -  Dr. Caryl Comes  . History of transient ischemic attack (TIA)   . Mixed hyperlipidemia    Statin intolerance  . Pneumonia   . Type 2 diabetes mellitus (HCC)    A1C 11.5 06/2017    Past Surgical History:  Procedure Laterality Date  . BALLOON ANGIOPLASTY, ARTERY    . CARDIAC ELECTROPHYSIOLOGY MAPPING AND ABLATION    . CARPAL TUNNEL RELEASE Bilateral   . cervical neck fusion     x 4  .  CORONARY STENT INTERVENTION N/A 10/17/2017   Procedure: CORONARY STENT INTERVENTION;  Surgeon: Martinique, Peter M, MD;  Location: [facility];  Service: Cardiovascular;  Laterality: N/A;  . CORONARY STENT PLACEMENT     x 2  . LEFT HEART CATH AND CORONARY ANGIOGRAPHY N/A 10/17/2017   Procedure: LEFT HEART CATH AND CORONARY ANGIOGRAPHY;  Surgeon: Martinique, Peter M, MD;  Location: [facility];  Service: Cardiovascular;  Laterality: N/A;  . ROTATOR CUFF REPAIR Right    x 4  . TIBIA FRACTURE SURGERY Left     Current Outpatient Medications  Medication Sig Dispense Refill  . atorvastatin (LIPITOR) 10 MG tablet Take 10 mg by mouth daily.    [name] glipiZIDE (GLUCOTROL XL) 10 MG 24 hr tablet Take 10 mg by mouth 2 (two) times daily.    . irbesartan (AVAPRO) 300 MG tablet Take 300 mg by mouth daily.    . isosorbide mononitrate (IMDUR) 30 MG 24 hr tablet Take 1 tablet (30 mg total) by mouth at bedtime. 30 tablet 6  . metoprolol tartrate (LOPRESSOR) 100 MG tablet Take 1 tablet (100 mg total) by mouth 2 (two) times daily. 60 tablet 6  . nitroGLYCERIN (NITROSTAT) 0.4 MG SL tablet Place 1 tablet (0.4 mg total) under the tongue every 5 (five) minutes as needed for chest pain. 25 tablet 3  . pioglitazone (ACTOS) 30 MG tablet Take 30 mg by mouth daily.    Willeen Niece 100-33 UNT-MCG/ML SOPN Inject into the skin.     No current facility-administered medications for this visit.   Allergies:  Statins   Social History: The patient  reports that he has been smoking cigarettes. He has a 54.00 pack-year smoking history. He has never used smokeless tobacco. He reports that he does not drink alcohol and does not use drugs.   Family History: The patient's family history includes Diabetes in his mother; Emphysema in his father; Heart disease in his brother and mother; Lung cancer in his brother.   ROS:  Please see the history of present illness. Otherwise, complete review of systems is positive for none.  All other  systems are reviewed and negative.   Physical Exam: VS:  BP 140/70   Pulse 98   Ht 6' (1.829 m)   Wt 178 lb 12.8 oz (81.1 kg)   SpO2 95%   BMI 24.25 kg/m , BMI Body mass index is 24.25 kg/m.  Wt Readings from Last 3 Encounters:  09/27/19 178 lb 12.8 oz (81.1 kg)  09/20/19 179 lb (81.2 kg)  09/16/19 190 lb 7.6 oz (86.4 kg)    General: Patient appears comfortable at rest. Neck: Supple, no elevated JVP or carotid bruits, no thyromegaly. Lungs: Clear to auscultation, nonlabored breathing at rest. Cardiac: Regular rate and rhythm, no S3 or significant systolic murmur, no pericardial rub. Extremities: No pitting edema, distal pulses 2+. Skin: Warm and dry. Musculoskeletal: No kyphosis. Neuropsychiatric: Alert and oriented x3, affect grossly appropriate.  ECG:  EKG 09/15/2019 shows sinus rhythm rate  of 74  Recent Labwork: 09/16/2019: ALT 21; AST 15; Platelets 170 09/17/2019: BUN 17; Creatinine, Ser 1.23; Hemoglobin 13.0; Magnesium 1.5; Potassium 4.3; Sodium 140     Component Value Date/Time   CHOL 157 09/16/2019 0614   TRIG 121 09/16/2019 0614   HDL 39 (L) 09/16/2019 0614   CHOLHDL 4.0 09/16/2019 0614   VLDL 24 09/16/2019 0614   LDLCALC 94 09/16/2019 0614    Other Studies Reviewed Today:   Echocardiogram 09/16/2019 1. Left ventricular ejection fraction, by estimation, is 60 to 65%. The left ventricle has normal function. The left ventricle has no regional wall motion abnormalities. There is mild left ventricular hypertrophy. Left ventricular diastolic parameters are consistent with Grade I diastolic dysfunction (impaired relaxation). Elevated left ventricular end-diastolic pressure. 2. Right ventricular systolic function is normal. The right ventricular size is normal. There is normal pulmonary artery systolic pressure. 3. The mitral valve is grossly normal. Trivial mitral valve regurgitation. 4. The aortic valve is tricuspid. Aortic valve regurgitation is not visualized.  Mild aortic valve sclerosis is present, with no evidence of aortic valve stenosis. 5. The inferior vena cava is normal in size with greater than 50% respiratory variability, suggesting right atrial pressure of 3 mmHg.   Carotid artery duplex study 07/16/2019 IMPRESSION: 1. Mild (1-49%) stenosis proximal left internal carotid artery secondary to mild smooth heterogeneous atherosclerotic plaque. 2. No significant atherosclerotic plaque or evidence of stenosis in the right internal carotid artery.  Mild intimal medial thickening 3. Vertebral arteries are patent with normal antegrade flow.     Cardiac catheterization and PCI 10/17/2017:  Prox RCA lesion is 15% stenosed.  Mid RCA lesion is 30% stenosed.  Mid LM lesion is 25% stenosed.  Mid LAD lesion is 85% stenosed.  A drug-eluting stent was successfully placed using a STENT SYNERGY DES 2.75X16.  Post intervention, there is a 0% residual stenosis.  Ost 2nd Mrg lesion is 50% stenosed.  Mid Cx lesion is 45% stenosed.  3rd Mrg lesion is 85% stenosed.  A drug-eluting stent was successfully placed using a STENT SYNERGY DES 3X24.  Post intervention, there is a 0% residual stenosis.  LV end diastolic pressure is normal.  1. Severe 2 vessel obstructive CAD- patient has diffuse CAD - 85% mid LAD - 85% large OM2 2. Patent stents in RCA.  3. Normal LVEDP 4. Successful PCI of the mid LAD with DES 5. Successful PCI of the OM2  Plan: aggressive risk factor modification. Need to stress compliance with medical therapy. DAPT for one year. Since he has no documented atrial arrhythmia since his ablation I would not resume DOAC. Anticipate DC in am  Recommend uninterrupted dual antiplatelet therapy with Aspirin 81mg  daily and Ticagrelor 90mg  twice dailyfor a minimum of 12 months (ACS - Class I recommendation).  Echocardiogram 06/28/2017: Study Conclusions  - Left ventricle: The cavity size was normal. Wall thickness  was increased in a pattern of moderate LVH. Systolic function was normal. The estimated ejection fraction was in the range of 55% to 60%. Wall motion was normal; there were no regional wall motion abnormalities. Doppler parameters are consistent with abnormal left ventricular relaxation (grade 1 diastolic dysfunction).  Impressions:  - Normal LV systolic function; mild diastolic dysfunction; moderate LVH.  Assessment and Plan:  1. CAD in native artery   2. Stenosis of carotid artery, unspecified laterality   3. Mixed hyperlipidemia   4. Epigastric discomfort    1. CAD in native artery History of DES x2 to RCA 2007 and  DES to mid LAD and OM 03/2017.  At last visit nitroglycerin was refilled.  He was continuing aspirin, Lipitor, Avapro, Imdur, Lopressor.  He denies any anginal or exertional symptoms today.  Continue Imdur 30 mg, metoprolol 100 mg p.o. twice daily, nitroglycerin sublingual as needed.  Aspirin was stopped due to hematuria.  2. Stenosis of carotid artery, unspecified laterality He was scheduled for repeat carotid studies secondary to history of carotid artery disease with bruits.  He was continuing aspirin and statin.  Recent carotid study showed mild 1 to 49% stenosis in left ICA.  No evidence of stenosis in right ICA.  Vertebral arteries bilaterally showed antegrade flow.  3. Mixed hyperlipidemia He was tolerating Lipitor 10 daily prescribed by PCP.  Discussion took place about PCSK9 inhibitors at last visit.  Continue atorvastatin 10 mg daily.  4. Epigastric discomfort Trial of Prilosec for epigastric discomfort started at last visit.  Patient currently not taking omeprazole.  Denies any epigastric discomfort today.   5.  Bladder neoplasm Patient has pending bladder surgery with Dr. Alyson Ingles urology.  Recent discovery of Endoluminal mass of the left aspect of anterior bladder dome measuring 2.7 cm concerning for primary bowel or bladder malignancy. See  RCRI score under HPI. Patient is low risk from cardiac standpoint to undergo TURBT under general anesthesia.   Medication Adjustments/Labs and Tests Ordered: Current medicines are reviewed at length with the patient today.  Concerns regarding medicines are outlined above.   Disposition: Follow-up with [name] for scheduled visit in November  Signed, Levell July, NP 09/27/2019 3:21 PM    [facility], [place], [place] [address] Phone: [phone]; Fax: [phone]

## 2019-09-27 NOTE — Patient Instructions (Signed)
Medication Instructions:  Continue all current medications.  Labwork: none  Testing/Procedures: none  Follow-Up: Already scheduled for November   Any Other Special Instructions Will Be Listed Below (If Applicable).  If you need a refill on your cardiac medications before your next appointment, please call your pharmacy.

## 2019-10-01 ENCOUNTER — Ambulatory Visit: Payer: Medicare Other | Admitting: Urology

## 2019-10-02 ENCOUNTER — Encounter (HOSPITAL_COMMUNITY): Payer: Self-pay

## 2019-10-04 ENCOUNTER — Other Ambulatory Visit (HOSPITAL_COMMUNITY)
Admission: RE | Admit: 2019-10-04 | Discharge: 2019-10-04 | Disposition: A | Discharge status: Home / Self Care | Payer: Medicare Other | Source: Ambulatory Visit | Attending: Urology | Admitting: Urology

## 2019-10-04 ENCOUNTER — Encounter: Payer: Self-pay | Admitting: Family Medicine

## 2019-10-04 ENCOUNTER — Encounter (HOSPITAL_COMMUNITY): Payer: Self-pay

## 2019-10-04 ENCOUNTER — Other Ambulatory Visit: Payer: Self-pay

## 2019-10-04 ENCOUNTER — Encounter (HOSPITAL_COMMUNITY)
Admission: RE | Admit: 2019-10-04 | Discharge: 2019-10-04 | Disposition: A | Discharge status: Home / Self Care | Payer: Medicare Other | Source: Ambulatory Visit | Attending: Urology | Admitting: Urology

## 2019-10-04 DIAGNOSIS — Z20822 Contact with and (suspected) exposure to covid-19: Secondary | ICD-10-CM | POA: Insufficient documentation

## 2019-10-04 DIAGNOSIS — Z01812 Encounter for preprocedural laboratory examination: Secondary | ICD-10-CM | POA: Insufficient documentation

## 2019-10-04 LAB — SARS CORONAVIRUS 2 (TAT 6-24 HRS): SARS Coronavirus 2: NEGATIVE

## 2019-10-04 NOTE — Pre-Procedure Instructions (Signed)
Interoffice message sent to Levell July concerning cardiac clearance.

## 2019-10-04 NOTE — Patient Instructions (Signed)
Your procedure is scheduled on: 10/08/2019  Report to [facility] at  9:25   AM.  Call this number if you have problems the morning of surgery: [phone]   Remember:   Do not Eat or Drink after midnight         No Smoking the morning of surgery  :  Take these medicines the morning of surgery with A SIP OF WATER: imdur and avapro   Do not wear jewelry, make-up or nail polish.  Do not wear lotions, powders, or perfumes. You may wear deodorant.  Do not shave 48 hours prior to surgery. Men may shave face and neck.  Do not bring valuables to the hospital.  Contacts, dentures or bridgework may not be worn into surgery.  Leave suitcase in the car. After surgery it may be brought to your room.  For patients admitted to the hospital, checkout time is 11:00 AM the day of discharge.   Patients discharged the day of surgery will not be allowed to drive home.    Special Instructions: Shower using CHG night before surgery and shower the day of surgery use CHG.  Use special wash - you have one bottle of CHG for all showers.  You should use approximately 1/2 of the bottle for each shower.  Transurethral Resection of Bladder Tumor, Care After This sheet gives you information about how to care for yourself after your procedure. Your health care provider may also give you more specific instructions. If you have problems or questions, contact your health care provider. What can I expect after the procedure? After the procedure, it is common to have:  A small amount of blood in your urine for up to 2 weeks.  Soreness or mild pain from your catheter. After your catheter is removed, you may have mild soreness, especially when urinating.  Pain in your lower abdomen. Follow these instructions at home: Medicines   Take over-the-counter and prescription medicines only as told by your health care provider.  If you were prescribed an antibiotic medicine, take it as told by your health care provider.  Do not stop taking the antibiotic even if you start to feel better.  Do not drive for 24 hours if you were given a sedative during your procedure.  Ask your health care provider if the medicine prescribed to you: ? Requires you to avoid driving or using heavy machinery. ? Can cause constipation. You may need to take these actions to prevent or treat constipation:  Take over-the-counter or prescription medicines.  Eat foods that are high in fiber, such as beans, whole grains, and fresh fruits and vegetables.  Limit foods that are high in fat and processed sugars, such as fried or sweet foods. Activity  Return to your normal activities as told by your health care provider. Ask your health care provider what activities are safe for you.  Do not lift anything that is heavier than 10 lb (4.5 kg), or the limit that you are told, until your health care provider says that it is safe.  Avoid intense physical activity for as long as told by your health care provider.  Rest as told by your health care provider.  Avoid sitting for a long time without moving. Get up to take short walks every 1-2 hours. This is important to improve blood flow and breathing. Ask for help if you feel weak or unsteady. General instructions   Do not drink alcohol for as long as told by your health  care provider. This is especially important if you are taking prescription pain medicines.  Do not take baths, swim, or use a hot tub until your health care provider approves. Ask your health care provider if you may take showers. You may only be allowed to take sponge baths.  If you have a catheter, follow instructions from your health care provider about caring for your catheter and your drainage bag.  Drink enough fluid to keep your urine pale yellow.  Wear compression stockings as told by your health care provider. These stockings help to prevent blood clots and reduce swelling in your legs.  Keep all follow-up  visits as told by your health care provider. This is important. ? You will need to be followed closely with regular checks of your bladder and urethra (cystoscopies) to make sure that the cancer does not come back. Contact a health care provider if:  You have pain that gets worse or does not improve with medicine.  You have blood in your urine for more than 2 weeks.  You have cloudy or bad-smelling urine.  You become constipated. Signs of constipation may include having: ? Fewer than three bowel movements in a week. ? Difficulty having a bowel movement. ? Stools that are dry, hard, or larger than normal.  You have a fever. Get help right away if:  You have: ? Severe pain. ? Bright red blood in your urine. ? Blood clots in your urine. ? A lot of blood in your urine.  Your catheter has been removed and you are not able to urinate.  You have a catheter in place and the catheter is not draining urine. Summary  After your procedure, it is common to have a small amount of blood in your urine, soreness or mild pain from your catheter, and pain in your lower abdomen.  Take over-the-counter and prescription medicines only as told by your health care provider.  Rest as told by your health care provider. Follow your health care provider's instructions about returning to normal activities. Ask what activities are safe for you.  If you have a catheter, follow instructions from your health care provider about caring for your catheter and your drainage bag.  Get help right away if you cannot urinate, you have severe pain, or you have bright red blood or blood clots in your urine. This information is not intended to replace advice given to you by your health care provider. Make sure you discuss any questions you have with your health care provider. Document Revised: 09/08/2017 Document Reviewed: 09/08/2017 Elsevier Patient Education  2020 [place] [facility], Adult, Care  After This sheet gives you information about how to care for yourself after your procedure. Your health care provider may also give you more specific instructions. If you have problems or questions, contact your health care provider. What can I expect after the procedure? After the procedure, the following side effects are common:  Pain or discomfort at the IV site.  Nausea.  Vomiting.  Sore throat.  Trouble concentrating.  Feeling cold or chills.  Weak or tired.  Sleepiness and fatigue.  Soreness and body aches. These side effects can affect parts of the body that were not involved in surgery. Follow these instructions at home:  For at least 24 hours after the procedure:  Have a responsible adult stay with you. It is important to have someone help care for you until you are awake and alert.  Rest as needed.  Do  not: ? Participate in activities in which you could fall or become injured. ? Drive. ? Use heavy machinery. ? Drink alcohol. ? Take sleeping pills or medicines that cause drowsiness. ? Make important decisions or sign legal documents. ? Take care of children on your own. Eating and drinking  Follow any instructions from your health care provider about eating or drinking restrictions.  When you feel hungry, start by eating small amounts of foods that are soft and easy to digest (bland), such as toast. Gradually return to your regular diet.  Drink enough fluid to keep your urine pale yellow.  If you vomit, rehydrate by drinking water, juice, or clear broth. General instructions  If you have sleep apnea, surgery and certain medicines can increase your risk for breathing problems. Follow instructions from your health care provider about wearing your sleep device: ? Anytime you are sleeping, including during daytime naps. ? While taking prescription pain medicines, sleeping medicines, or medicines that make you drowsy.  Return to your normal activities as told  by your health care provider. Ask your health care provider what activities are safe for you.  Take over-the-counter and prescription medicines only as told by your health care provider.  If you smoke, do not smoke without supervision.  Keep all follow-up visits as told by your health care provider. This is important. Contact a health care provider if:  You have nausea or vomiting that does not get better with medicine.  You cannot eat or drink without vomiting.  You have pain that does not get better with medicine.  You are unable to pass urine.  You develop a skin rash.  You have a fever.  You have redness around your IV site that gets worse. Get help right away if:  You have difficulty breathing.  You have chest pain.  You have blood in your urine or stool, or you vomit blood. Summary  After the procedure, it is common to have a sore throat or nausea. It is also common to feel tired.  Have a responsible adult stay with you for the first 24 hours after general anesthesia. It is important to have someone help care for you until you are awake and alert.  When you feel hungry, start by eating small amounts of foods that are soft and easy to digest (bland), such as toast. Gradually return to your regular diet.  Drink enough fluid to keep your urine pale yellow.  Return to your normal activities as told by your health care provider. Ask your health care provider what activities are safe for you. This information is not intended to replace advice given to you by your health care provider. Make sure you discuss any questions you have with your health care provider. Document Revised: 02/11/2017 Document Reviewed: 09/24/2016 Elsevier Patient Education  [place].

## 2019-10-05 ENCOUNTER — Ambulatory Visit: Payer: Medicare Other | Admitting: Urology

## 2019-10-05 ENCOUNTER — Other Ambulatory Visit (HOSPITAL_COMMUNITY): Admission: RE | Admit: 2019-10-05 | Payer: Medicare Other | Source: Ambulatory Visit

## 2019-10-08 ENCOUNTER — Encounter (HOSPITAL_COMMUNITY): Payer: Self-pay | Admitting: Urology

## 2019-10-08 ENCOUNTER — Ambulatory Visit (HOSPITAL_COMMUNITY): Payer: Medicare Other

## 2019-10-08 ENCOUNTER — Ambulatory Visit (HOSPITAL_COMMUNITY)
Admission: RE | Admit: 2019-10-08 | Discharge: 2019-10-08 | Disposition: A | Discharge status: Home / Self Care | Payer: Medicare Other | Attending: Urology | Admitting: Urology

## 2019-10-08 ENCOUNTER — Ambulatory Visit (HOSPITAL_COMMUNITY): Payer: Medicare Other | Admitting: Anesthesiology

## 2019-10-08 ENCOUNTER — Encounter (HOSPITAL_COMMUNITY)
Admission: RE | Disposition: A | Discharge status: Home / Self Care | Payer: Self-pay | Source: Home / Self Care | Attending: Urology

## 2019-10-08 ENCOUNTER — Other Ambulatory Visit: Payer: Self-pay

## 2019-10-08 DIAGNOSIS — Z8673 Personal history of transient ischemic attack (TIA), and cerebral infarction without residual deficits: Secondary | ICD-10-CM | POA: Diagnosis not present

## 2019-10-08 DIAGNOSIS — C679 Malignant neoplasm of bladder, unspecified: Secondary | ICD-10-CM | POA: Diagnosis not present

## 2019-10-08 DIAGNOSIS — Z794 Long term (current) use of insulin: Secondary | ICD-10-CM | POA: Diagnosis not present

## 2019-10-08 DIAGNOSIS — D494 Neoplasm of unspecified behavior of bladder: Secondary | ICD-10-CM | POA: Diagnosis not present

## 2019-10-08 DIAGNOSIS — C678 Malignant neoplasm of overlapping sites of bladder: Secondary | ICD-10-CM | POA: Diagnosis not present

## 2019-10-08 DIAGNOSIS — I10 Essential (primary) hypertension: Secondary | ICD-10-CM | POA: Diagnosis not present

## 2019-10-08 DIAGNOSIS — E782 Mixed hyperlipidemia: Secondary | ICD-10-CM | POA: Insufficient documentation

## 2019-10-08 DIAGNOSIS — Z955 Presence of coronary angioplasty implant and graft: Secondary | ICD-10-CM | POA: Insufficient documentation

## 2019-10-08 DIAGNOSIS — I4891 Unspecified atrial fibrillation: Secondary | ICD-10-CM | POA: Diagnosis not present

## 2019-10-08 DIAGNOSIS — N3289 Other specified disorders of bladder: Secondary | ICD-10-CM

## 2019-10-08 DIAGNOSIS — Z79899 Other long term (current) drug therapy: Secondary | ICD-10-CM | POA: Insufficient documentation

## 2019-10-08 DIAGNOSIS — F17210 Nicotine dependence, cigarettes, uncomplicated: Secondary | ICD-10-CM | POA: Diagnosis not present

## 2019-10-08 DIAGNOSIS — I25119 Atherosclerotic heart disease of native coronary artery with unspecified angina pectoris: Secondary | ICD-10-CM | POA: Insufficient documentation

## 2019-10-08 DIAGNOSIS — E119 Type 2 diabetes mellitus without complications: Secondary | ICD-10-CM | POA: Insufficient documentation

## 2019-10-08 HISTORY — PX: CYSTOSCOPY W/ RETROGRADES: SHX1426

## 2019-10-08 HISTORY — PX: TRANSURETHRAL RESECTION OF BLADDER TUMOR: SHX2575

## 2019-10-08 HISTORY — PX: CYSTOSCOPY WITH URETHRAL DILATATION: SHX5125

## 2019-10-08 LAB — GLUCOSE, CAPILLARY
Glucose-Capillary: 253 mg/dL — ABNORMAL HIGH (ref 70–99)
Glucose-Capillary: 210 mg/dL — ABNORMAL HIGH (ref 70–99)

## 2019-10-08 SURGERY — CYSTOSCOPY, WITH RETROGRADE PYELOGRAM
Anesthesia: General

## 2019-10-08 MED ORDER — LIDOCAINE 2% (20 MG/ML) 5 ML SYRINGE
INTRAMUSCULAR | Status: AC
  Filled 2019-10-08: qty 5

## 2019-10-08 MED ORDER — BELLADONNA ALKALOIDS-OPIUM 16.2-30 MG RE SUPP: 1.0000 | Freq: Two times a day (BID) | RECTAL | 0 refills | Status: DC | PRN

## 2019-10-08 MED ORDER — LIDOCAINE HCL (CARDIAC) PF 100 MG/5ML IV SOSY
PREFILLED_SYRINGE | INTRAVENOUS | Status: DC | PRN
  Administered 2019-10-08: 60 mg via INTRAVENOUS

## 2019-10-08 MED ORDER — PROPOFOL 10 MG/ML IV BOLUS
INTRAVENOUS | Status: DC | PRN
  Administered 2019-10-08: 120 mg via INTRAVENOUS

## 2019-10-08 MED ORDER — FENTANYL CITRATE (PF) 100 MCG/2ML IJ SOLN
INTRAMUSCULAR | Status: DC | PRN
  Administered 2019-10-08 (×2): 25 ug via INTRAVENOUS

## 2019-10-08 MED ORDER — DEXAMETHASONE SODIUM PHOSPHATE 10 MG/ML IJ SOLN
INTRAMUSCULAR | Status: AC
  Filled 2019-10-08: qty 1

## 2019-10-08 MED ORDER — ONDANSETRON HCL 4 MG/2ML IJ SOLN
INTRAMUSCULAR | Status: AC
  Filled 2019-10-08: qty 4

## 2019-10-08 MED ORDER — HYDROMORPHONE HCL 1 MG/ML IJ SOLN
INTRAMUSCULAR | Status: AC
  Filled 2019-10-08: qty 0.5

## 2019-10-08 MED ORDER — OXYCODONE-ACETAMINOPHEN 5-325 MG PO TABS: 1.0000 | ORAL_TABLET | Freq: Once | ORAL | Status: DC

## 2019-10-08 MED ORDER — LACTATED RINGERS IV SOLN
Freq: Once | INTRAVENOUS | Status: AC
  Administered 2019-10-08: 1000 mL via INTRAVENOUS

## 2019-10-08 MED ORDER — LABETALOL HCL 5 MG/ML IV SOLN
10.0000 mg | Freq: Once | INTRAVENOUS | Status: AC
  Administered 2019-10-08: 20 mg via INTRAVENOUS

## 2019-10-08 MED ORDER — ONDANSETRON HCL 4 MG/2ML IJ SOLN
INTRAMUSCULAR | Status: DC | PRN
  Administered 2019-10-08: 4 mg via INTRAVENOUS

## 2019-10-08 MED ORDER — DIATRIZOATE MEGLUMINE 30 % UR SOLN
URETHRAL | Status: DC | PRN
  Administered 2019-10-08: 17 mL

## 2019-10-08 MED ORDER — BELLADONNA ALKALOIDS-OPIUM 16.2-30 MG RE SUPP
1.0000 | Freq: Once | RECTAL | Status: DC
  Filled 2019-10-08: qty 1

## 2019-10-08 MED ORDER — LABETALOL HCL 5 MG/ML IV SOLN
INTRAVENOUS | Status: AC
  Filled 2019-10-08: qty 4

## 2019-10-08 MED ORDER — HYDRALAZINE HCL 20 MG/ML IJ SOLN: 5.0000 mg | Freq: Once | INTRAMUSCULAR | Status: DC

## 2019-10-08 MED ORDER — MEPERIDINE HCL 50 MG/ML IJ SOLN: 6.2500 mg | INTRAMUSCULAR | Status: DC | PRN

## 2019-10-08 MED ORDER — PHENYLEPHRINE 40 MCG/ML (10ML) SYRINGE FOR IV PUSH (FOR BLOOD PRESSURE SUPPORT)
PREFILLED_SYRINGE | INTRAVENOUS | Status: AC
  Filled 2019-10-08: qty 20

## 2019-10-08 MED ORDER — PHENYLEPHRINE 40 MCG/ML (10ML) SYRINGE FOR IV PUSH (FOR BLOOD PRESSURE SUPPORT)
PREFILLED_SYRINGE | INTRAVENOUS | Status: DC | PRN
  Administered 2019-10-08: 80 ug via INTRAVENOUS

## 2019-10-08 MED ORDER — SODIUM CHLORIDE 0.9 % IR SOLN
Status: DC | PRN
  Administered 2019-10-08 (×3): 3000 mL via INTRAVESICAL

## 2019-10-08 MED ORDER — CEFAZOLIN SODIUM-DEXTROSE 2-4 GM/100ML-% IV SOLN
2.0000 g | INTRAVENOUS | Status: AC
  Administered 2019-10-08: 2 g via INTRAVENOUS
  Filled 2019-10-08: qty 100

## 2019-10-08 MED ORDER — OXYCODONE-ACETAMINOPHEN 5-325 MG PO TABS: 1.0000 | ORAL_TABLET | ORAL | 0 refills | Status: DC | PRN

## 2019-10-08 MED ORDER — LACTATED RINGERS IV SOLN: INTRAVENOUS | Status: DC | PRN

## 2019-10-08 MED ORDER — FENTANYL CITRATE (PF) 100 MCG/2ML IJ SOLN
INTRAMUSCULAR | Status: AC
  Filled 2019-10-08: qty 2

## 2019-10-08 MED ORDER — HYDROMORPHONE HCL 1 MG/ML IJ SOLN
0.2500 mg | INTRAMUSCULAR | Status: DC | PRN
  Administered 2019-10-08: 0.5 mg via INTRAVENOUS
  Administered 2019-10-08: 0.25 mg via INTRAVENOUS

## 2019-10-08 MED ORDER — ONDANSETRON HCL 4 MG/2ML IJ SOLN
4.0000 mg | Freq: Once | INTRAMUSCULAR | Status: AC | PRN
  Administered 2019-10-08: 4 mg via INTRAVENOUS
  Filled 2019-10-08: qty 2

## 2019-10-08 MED ORDER — BELLADONNA ALKALOIDS-OPIUM 16.2-60 MG RE SUPP
1.0000 | Freq: Once | RECTAL | Status: AC
  Administered 2019-10-08: 1 via RECTAL
  Filled 2019-10-08: qty 1

## 2019-10-08 MED ORDER — ORAL CARE MOUTH RINSE: 15.0000 mL | Freq: Once | OROMUCOSAL | Status: AC

## 2019-10-08 MED ORDER — STERILE WATER FOR IRRIGATION IR SOLN
Status: DC | PRN
  Administered 2019-10-08: 500 mL

## 2019-10-08 MED ORDER — PROPOFOL 10 MG/ML IV BOLUS
INTRAVENOUS | Status: AC
  Filled 2019-10-08: qty 20

## 2019-10-08 MED ORDER — DIATRIZOATE MEGLUMINE 30 % UR SOLN
URETHRAL | Status: AC
  Filled 2019-10-08: qty 100

## 2019-10-08 MED ORDER — CHLORHEXIDINE GLUCONATE 0.12 % MT SOLN
15.0000 mL | Freq: Once | OROMUCOSAL | Status: AC
  Administered 2019-10-08: 15 mL via OROMUCOSAL
  Filled 2019-10-08: qty 15

## 2019-10-08 SURGICAL SUPPLY — 34 items
BAG DRAIN URO TABLE W/ADPT NS (BAG) ×5 IMPLANT
BAG DRN 8 ADPR NS SKTRN CSTL (BAG) ×3
BAG DRN RND TRDRP ANRFLXCHMBR (UROLOGICAL SUPPLIES) ×3
BAG HAMPER (MISCELLANEOUS) ×5 IMPLANT
BAG URINE DRAIN 2000ML AR STRL (UROLOGICAL SUPPLIES) ×5 IMPLANT
CATH FOLEY 3WAY 30CC 22F (CATHETERS) ×5 IMPLANT
CATH INTERMIT  6FR 70CM (CATHETERS) ×5 IMPLANT
CLOTH BEACON ORANGE TIMEOUT ST (SAFETY) ×5 IMPLANT
DECANTER SPIKE VIAL GLASS SM (MISCELLANEOUS) ×5 IMPLANT
ELECT LOOP 22F BIPOLAR SML (ELECTROSURGICAL) ×5
ELECT REM PT RETURN 9FT ADLT (ELECTROSURGICAL) ×5
ELECTRODE LOOP 22F BIPOLAR SML (ELECTROSURGICAL) ×3 IMPLANT
ELECTRODE REM PT RTRN 9FT ADLT (ELECTROSURGICAL) ×3 IMPLANT
GLOVE BIO SURGEON STRL SZ8 (GLOVE) ×5 IMPLANT
GLOVE BIOGEL PI IND STRL 7.0 (GLOVE) ×6 IMPLANT
GLOVE BIOGEL PI INDICATOR 7.0 (GLOVE) ×4
GLOVE ECLIPSE 6.5 STRL STRAW (GLOVE) ×5 IMPLANT
GOWN STRL REUS W/TWL LRG LVL3 (GOWN DISPOSABLE) ×5 IMPLANT
GOWN STRL REUS W/TWL XL LVL3 (GOWN DISPOSABLE) ×5 IMPLANT
GUIDEWIRE STR DUAL SENSOR (WIRE) ×5 IMPLANT
GUIDEWIRE STR ZIPWIRE 035X150 (MISCELLANEOUS) ×5 IMPLANT
IV NS IRRIG 3000ML ARTHROMATIC (IV SOLUTION) ×15 IMPLANT
KIT TURNOVER CYSTO (KITS) ×5 IMPLANT
MANIFOLD NEPTUNE II (INSTRUMENTS) ×5 IMPLANT
PACK CYSTO (CUSTOM PROCEDURE TRAY) ×5 IMPLANT
PAD ARMBOARD 7.5X6 YLW CONV (MISCELLANEOUS) ×5 IMPLANT
PLUG CATH AND CAP STER (CATHETERS) ×5 IMPLANT
STENT URET 6FRX24 CONTOUR (STENTS) ×5 IMPLANT
STENT URET 6FRX26 CONTOUR (STENTS) IMPLANT
SYR 30ML LL (SYRINGE) ×5 IMPLANT
SYR TOOMEY IRRIG 70ML (MISCELLANEOUS) ×5
SYRINGE TOOMEY IRRIG 70ML (MISCELLANEOUS) ×3 IMPLANT
TOWEL OR 17X26 4PK STRL BLUE (TOWEL DISPOSABLE) ×5 IMPLANT
WATER STERILE IRR 500ML POUR (IV SOLUTION) ×5 IMPLANT

## 2019-10-08 NOTE — Anesthesia Postprocedure Evaluation (Signed)
Anesthesia Post Note  Patient: Richard [name]  Procedure(s) Performed: CYSTOSCOPY WITH BILATERAL RETROGRADE PYELOGRAM (Bilateral ) TRANSURETHRAL RESECTION OF BLADDER TUMOR (TURBT) (N/A ) CYSTOSCOPY WITH URETHRAL DILATATION  Patient location during evaluation: PACU Anesthesia Type: General Level of consciousness: awake and alert and oriented Pain management: pain level controlled Vital Signs Assessment: post-procedure vital signs reviewed and stable Respiratory status: spontaneous breathing and respiratory function stable Cardiovascular status: blood pressure returned to baseline Postop Assessment: no apparent nausea or vomiting Anesthetic complications: no   No complications documented.   Last Vitals:  Vitals:   10/08/19 1630 10/08/19 1700  BP: (!) 169/94 (!) 185/79  Pulse: 80 84  Resp: 14 13  Temp:    SpO2: 96% 96%    Last Pain:  Vitals:   10/08/19 1630  TempSrc:   PainSc: 2                  TRUE Garciamartinez C Garren Greenman

## 2019-10-08 NOTE — Anesthesia Preprocedure Evaluation (Addendum)
Anesthesia Evaluation  Patient identified by MRN, date of birth, ID band Patient awake    Reviewed: Allergy & Precautions, NPO status , Patient's Chart, lab work & pertinent test results, reviewed documented beta blocker date and time   Airway Mallampati: II  TM Distance: >3 FB Neck ROM: Limited   Comment: cervical neck fusion  Dental  (+) Edentulous Upper, Edentulous Lower   Pulmonary shortness of breath and with exertion, pneumonia, resolved, Current Smoker and Patient abstained from smoking.,           Cardiovascular Exercise Tolerance: Good hypertension, Pt. on home beta blockers and Pt. on medications + angina + CAD and + Cardiac Stents (last stent in 09/2017)  + dysrhythmias Atrial Fibrillation  Rhythm:Regular Rate:Normal - Systolic murmurs, - Diastolic murmurs, - Friction Rub, - Carotid Bruit, - Peripheral Edema and - Systolic Click Occasional PVCs 15-Sep-2019 21:21:42 [facility] ROUTINE RECORD Sinus rhythm since last tracing no significant change Confirmed by Noemi Chapel [phone]) on 09/15/2019 9:23:54 PM 1. Left ventricular ejection fraction, by estimation, is 60 to 65%. The  left ventricle has normal function. The left ventricle has no regional  wall motion abnormalities. There is mild left ventricular hypertrophy.  Left ventricular diastolic parameters  are consistent with Grade I diastolic dysfunction (impaired relaxation).  Elevated left ventricular end-diastolic pressure.  2. Right ventricular systolic function is normal. The right ventricular  size is normal. There is normal pulmonary artery systolic pressure.  3. The mitral valve is grossly normal. Trivial mitral valve  regurgitation.  4. The aortic valve is tricuspid. Aortic valve regurgitation is not  visualized. Mild aortic valve sclerosis is present, with no evidence of  aortic valve stenosis.  5. The inferior vena cava is normal in size  with greater than 50%  respiratory variability, suggesting right atrial pressure of 3 mmHg   Neuro/Psych TIA Neuromuscular disease    GI/Hepatic negative GI ROS, Neg liver ROS,   Endo/Other  diabetes, Well Controlled, Type 2, Oral Hypoglycemic Agents  Renal/GU Renal InsufficiencyRenal disease (AKI)     Musculoskeletal  (+) Arthritis , cervical neck fusion    Abdominal   Peds  Hematology   Anesthesia Other Findings   Reproductive/Obstetrics                            Anesthesia Physical Anesthesia Plan  ASA: III  Anesthesia Plan: General   Post-op Pain Management:    Induction:   PONV Risk Score and Plan:   Airway Management Planned: LMA  Additional Equipment:   Intra-op Plan:   Post-operative Plan: Extubation in OR  Informed Consent: I have reviewed the patients History and Physical, chart, labs and discussed the procedure including the risks, benefits and alternatives for the proposed anesthesia with the patient or authorized representative who has indicated his/her understanding and acceptance.     Dental advisory given  Plan Discussed with: CRNA and Surgeon  Anesthesia Plan Comments: (Risk of renal function getting worse after anesthesia was explained.)      Anesthesia Quick Evaluation

## 2019-10-08 NOTE — Anesthesia Procedure Notes (Signed)
Procedure Name: LMA Insertion Date/Time: 10/08/2019 11:00 AM Performed by: Raenette Rover, CRNA Pre-anesthesia Checklist: Patient identified, Emergency Drugs available, Suction available and Patient being monitored Patient Re-evaluated:Patient Re-evaluated prior to induction Oxygen Delivery Method: Circle system utilized Preoxygenation: Pre-oxygenation with 100% oxygen Induction Type: IV induction LMA: LMA inserted LMA Size: 4.0 Number of attempts: 1 Placement Confirmation: positive ETCO2 and breath sounds checked- equal and bilateral Tube secured with: Tape Dental Injury: Teeth and Oropharynx as per pre-operative assessment

## 2019-10-08 NOTE — Progress Notes (Signed)
Pt has been up to BR, thought he had to have a BM but did not. Is in pain- will give IV pain med& po pain med. Nausea med given as well. Dr Modesta Messing at bedside.

## 2019-10-08 NOTE — Discharge Instructions (Signed)
Transurethral Resection of Bladder Tumor, Care After This sheet gives you information about how to care for yourself after your procedure. Your health care provider may also give you more specific instructions. If you have problems or questions, contact your health care provider. What can I expect after the procedure? After the procedure, it is common to have:  A small amount of blood in your urine for up to 2 weeks.  Soreness or mild pain from your catheter. After your catheter is removed, you may have mild soreness, especially when urinating.  Pain in your lower abdomen. Follow these instructions at home: Medicines   Take over-the-counter and prescription medicines only as told by your health care provider.  If you were prescribed an antibiotic medicine, take it as told by your health care provider. Do not stop taking the antibiotic even if you start to feel better.  Do not drive for 24 hours if you were given a sedative during your procedure.  Ask your health care provider if the medicine prescribed to you: ? Requires you to avoid driving or using heavy machinery. ? Can cause constipation. You may need to take these actions to prevent or treat constipation:  Take over-the-counter or prescription medicines.  Eat foods that are high in fiber, such as beans, whole grains, and fresh fruits and vegetables.  Limit foods that are high in fat and processed sugars, such as fried or sweet foods. Activity  Return to your normal activities as told by your health care provider. Ask your health care provider what activities are safe for you.  Do not lift anything that is heavier than 10 lb (4.5 kg), or the limit that you are told, until your health care provider says that it is safe.  Avoid intense physical activity for as long as told by your health care provider.  Rest as told by your health care provider.  Avoid sitting for a long time without moving. Get up to take short walks every  1-2 hours. This is important to improve blood flow and breathing. Ask for help if you feel weak or unsteady. General instructions   Do not drink alcohol for as long as told by your health care provider. This is especially important if you are taking prescription pain medicines.  Do not take baths, swim, or use a hot tub until your health care provider approves. Ask your health care provider if you may take showers. You may only be allowed to take sponge baths.  If you have a catheter, follow instructions from your health care provider about caring for your catheter and your drainage bag.  Drink enough fluid to keep your urine pale yellow.  Wear compression stockings as told by your health care provider. These stockings help to prevent blood clots and reduce swelling in your legs.  Keep all follow-up visits as told by your health care provider. This is important. ? You will need to be followed closely with regular checks of your bladder and urethra (cystoscopies) to make sure that the cancer does not come back. Contact a health care provider if:  You have pain that gets worse or does not improve with medicine.  You have blood in your urine for more than 2 weeks.  You have cloudy or bad-smelling urine.  You become constipated. Signs of constipation may include having: ? Fewer than three bowel movements in a week. ? Difficulty having a bowel movement. ? Stools that are dry, hard, or larger than normal.  You have  a fever. Get help right away if:  You have: ? Severe pain. ? Bright red blood in your urine. ? Blood clots in your urine. ? A lot of blood in your urine.  Your catheter has been removed and you are not able to urinate.  You have a catheter in place and the catheter is not draining urine. Summary  After your procedure, it is common to have a small amount of blood in your urine, soreness or mild pain from your catheter, and pain in your lower abdomen.  Take  over-the-counter and prescription medicines only as told by your health care provider.  Rest as told by your health care provider. Follow your health care provider's instructions about returning to normal activities. Ask what activities are safe for you.  If you have a catheter, follow instructions from your health care provider about caring for your catheter and your drainage bag.  Get help right away if you cannot urinate, you have severe pain, or you have bright red blood or blood clots in your urine. This information is not intended to replace advice given to you by your health care provider. Make sure you discuss any questions you have with your health care provider. Document Revised: 09/08/2017 Document Reviewed: 09/08/2017 Elsevier Patient Education  [place], Adult An indwelling urinary catheter is a thin tube that is put into your bladder. The tube helps to drain pee (urine) out of your body. The tube goes in through your urethra. Your urethra is where pee comes out of your body. Your pee will come out through the catheter, then it will go into a bag (drainage bag). Take good care of your catheter so it will work well. How to wear your catheter and bag Supplies needed  Sticky tape (adhesive tape) or a leg strap.  Alcohol wipe or soap and water (if you use tape).  A clean towel (if you use tape).  Large overnight bag.  Smaller bag (leg bag). Wearing your catheter Attach your catheter to your leg with tape or a leg strap.  Make sure the catheter is not pulled tight.  If a leg strap gets wet, take it off and put on a dry strap.  If you use tape to hold the bag on your leg: 1. Use an alcohol wipe or soap and water to wash your skin where the tape made it sticky before. 2. Use a clean towel to pat-dry that skin. 3. Use new tape to make the bag stay on your leg. Wearing your bags You should have been given a large overnight  bag.  You may wear the overnight bag in the day or night.  Always have the overnight bag lower than your bladder.  Do not let the bag touch the floor.  Before you go to sleep, put a clean plastic bag in a wastebasket. Then hang the overnight bag inside the wastebasket. You should also have a smaller leg bag that fits under your clothes.  Always wear the leg bag below your knee.  Do not wear your leg bag at night. How to care for your skin and catheter Supplies needed  A clean washcloth.  Water and mild soap.  A clean towel. Caring for your skin and catheter      Clean the skin around your catheter every day: 1. Wash your hands with soap and water. 2. Wet a clean washcloth in warm water and mild soap. 3. Clean  the skin around your urethra.  If you are male:  Gently spread the folds of skin around your vagina (labia).  With the washcloth in your other hand, wipe the inner side of your labia on each side. Wipe from front to back.  If you are male:  Pull back any skin that covers the end of your penis (foreskin).  With the washcloth in your other hand, wipe your penis in small circles. Start wiping at the tip of your penis, then move away from the catheter.  Move the foreskin back in place, if needed. 4. With your free hand, hold the catheter close to where it goes into your body.  Keep holding the catheter during cleaning so it does not get pulled out. 5. With the washcloth in your other hand, clean the catheter.  Only wipe downward on the catheter.  Do not wipe upward toward your body. Doing this may push germs into your urethra and cause infection. 6. Use a clean towel to pat-dry the catheter and the skin around it. Make sure to wipe off all soap. 7. Wash your hands with soap and water.  Shower every day. Do not take baths.  Do not use cream, ointment, or lotion on the area where the catheter goes into your body, unless your doctor tells you to.  Do not use  powders, sprays, or lotions on your genital area.  Check your skin around the catheter every day for signs of infection. Check for: ? Redness, swelling, or pain. ? Fluid or blood. ? Warmth. ? Pus or a bad smell. How to empty the bag Supplies needed  Rubbing alcohol.  Gauze pad or cotton ball.  Tape or a leg strap. Emptying the bag Pour the pee out of your bag when it is ?- full, or at least 2-3 times a day. Do this for your overnight bag and your leg bag. 1. Wash your hands with soap and water. 2. Separate (detach) the bag from your leg. 3. Hold the bag over the toilet or a clean pail. Keep the bag lower than your hips and bladder. This is so the pee (urine) does not go back into the tube. 4. Open the pour spout. It is at the bottom of the bag. 5. Empty the pee into the toilet or pail. Do not let the pour spout touch any surface. 6. Put rubbing alcohol on a gauze pad or cotton ball. 7. Use the gauze pad or cotton ball to clean the pour spout. 8. Close the pour spout. 9. Attach the bag to your leg with tape or a leg strap. 10. Wash your hands with soap and water. Follow instructions for cleaning the drainage bag:  From the product maker.  As told by your doctor. How to change the bag Supplies needed  Alcohol wipes.  A clean bag.  Tape or a leg strap. Changing the bag Replace your bag when it starts to leak, smell bad, or look dirty. 1. Wash your hands with soap and water. 2. Separate the dirty bag from your leg. 3. Pinch the catheter with your fingers so that pee does not spill out. 4. Separate the catheter tube from the bag tube where these tubes connect (at the connection valve). Do not let the tubes touch any surface. 5. Clean the end of the catheter tube with an alcohol wipe. Use a different alcohol wipe to clean the end of the bag tube. 6. Connect the catheter tube to the tube of the  clean bag. 7. Attach the clean bag to your leg with tape or a leg strap. Do not  make the bag tight on your leg. 8. Wash your hands with soap and water. General rules   Never pull on your catheter. Never try to take it out. Doing that can hurt you.  Always wash your hands before and after you touch your catheter or bag. Use a mild, fragrance-free soap. If you do not have soap and water, use hand sanitizer.  Always make sure there are no twists or bends (kinks) in the catheter tube.  Always make sure there are no leaks in the catheter or bag.  Drink enough fluid to keep your pee pale yellow.  Do not take baths, swim, or use a hot tub.  If you are male, wipe from front to back after you poop (have a bowel movement). Contact a doctor if:  Your pee is cloudy.  Your pee smells worse than usual.  Your catheter gets clogged.  Your catheter leaks.  Your bladder feels full. Get help right away if:  You have redness, swelling, or pain where the catheter goes into your body.  You have fluid, blood, pus, or a bad smell coming from the area where the catheter goes into your body.  Your skin feels warm where the catheter goes into your body.  You have a fever.  You have pain in your: ? Belly (abdomen). ? Legs. ? Lower back. ? Bladder.  You see blood in the catheter.  Your pee is pink or red.  You feel sick to your stomach (nauseous).  You throw up (vomit).  You have chills.  Your pee is not draining into the bag.  Your catheter gets pulled out. Summary  An indwelling urinary catheter is a thin tube that is placed into the bladder to help drain pee (urine) out of the body.  The catheter is placed into the part of the body that drains pee from the bladder (urethra).  Taking good care of your catheter will keep it working properly and help prevent problems.  Always wash your hands before and after touching your catheter or bag.  Never pull on your catheter or try to take it out. This information is not intended to replace advice given to you  by your health care provider. Make sure you discuss any questions you have with your health care provider. Document Revised: 06/02/2018 Document Reviewed: 09/24/2016 Elsevier Patient Education  2020 [place] Anesthesia, Adult, Care After This sheet gives you information about how to care for yourself after your procedure. Your health care provider may also give you more specific instructions. If you have problems or questions, contact your health care provider. What can I expect after the procedure? After the procedure, the following side effects are common:  Pain or discomfort at the IV site.  Nausea.  Vomiting.  Sore throat.  Trouble concentrating.  Feeling cold or chills.  Weak or tired.  Sleepiness and fatigue.  Soreness and body aches. These side effects can affect parts of the body that were not involved in surgery. Follow these instructions at home:  For at least 24 hours after the procedure:  Have a responsible adult stay with you. It is important to have someone help care for you until you are awake and alert.  Rest as needed.  Do not: ? Participate in activities in which you could fall or become injured. ? Drive. ?  Use heavy machinery. ? Drink alcohol. ? Take sleeping pills or medicines that cause drowsiness. ? Make important decisions or sign legal documents. ? Take care of children on your own. Eating and drinking  Follow any instructions from your health care provider about eating or drinking restrictions.  When you feel hungry, start by eating small amounts of foods that are soft and easy to digest (bland), such as toast. Gradually return to your regular diet.  Drink enough fluid to keep your urine pale yellow.  If you vomit, rehydrate by drinking water, juice, or clear broth. General instructions  If you have sleep apnea, surgery and certain medicines can increase your risk for breathing problems. Follow instructions from  your health care provider about wearing your sleep device: ? Anytime you are sleeping, including during daytime naps. ? While taking prescription pain medicines, sleeping medicines, or medicines that make you drowsy.  Return to your normal activities as told by your health care provider. Ask your health care provider what activities are safe for you.  Take over-the-counter and prescription medicines only as told by your health care provider.  If you smoke, do not smoke without supervision.  Keep all follow-up visits as told by your health care provider. This is important. Contact a health care provider if:  You have nausea or vomiting that does not get better with medicine.  You cannot eat or drink without vomiting.  You have pain that does not get better with medicine.  You are unable to pass urine.  You develop a skin rash.  You have a fever.  You have redness around your IV site that gets worse. Get help right away if:  You have difficulty breathing.  You have chest pain.  You have blood in your urine or stool, or you vomit blood. Summary  After the procedure, it is common to have a sore throat or nausea. It is also common to feel tired.  Have a responsible adult stay with you for the first 24 hours after general anesthesia. It is important to have someone help care for you until you are awake and alert.  When you feel hungry, start by eating small amounts of foods that are soft and easy to digest (bland), such as toast. Gradually return to your regular diet.  Drink enough fluid to keep your urine pale yellow.  Return to your normal activities as told by your health care provider. Ask your health care provider what activities are safe for you. This information is not intended to replace advice given to you by your health care provider. Make sure you discuss any questions you have with your health care provider. Document Revised: 02/11/2017 Document Reviewed:  09/24/2016 Elsevier Patient Education  [place].

## 2019-10-08 NOTE — Interval H&P Note (Signed)
History and Physical Interval Note:  10/08/2019 10:41 AM  Richard [name]  has presented today for surgery, with the diagnosis of bladder tumor.  The various methods of treatment have been discussed with the patient and family. After consideration of risks, benefits and other options for treatment, the patient has consented to  Procedure(s): CYSTOSCOPY WITH RETROGRADE PYELOGRAM (Bilateral) TRANSURETHRAL RESECTION OF BLADDER TUMOR (TURBT) (N/A) as a surgical intervention.  The patient's history has been reviewed, patient examined, no change in status, stable for surgery.  I have reviewed the patient's chart and labs.  Questions were answered to the patient's satisfaction.     [name]

## 2019-10-08 NOTE — Transfer of Care (Signed)
Immediate Anesthesia Transfer of Care Note  Patient: Richard [name]  Procedure(s) Performed: CYSTOSCOPY WITH BILATERAL RETROGRADE PYELOGRAM (Bilateral ) TRANSURETHRAL RESECTION OF BLADDER TUMOR (TURBT) (N/A ) CYSTOSCOPY WITH URETHRAL DILATATION  Patient Location: PACU  Anesthesia Type:General  Level of Consciousness: awake  Airway & Oxygen Therapy: Patient Spontanous Breathing  Post-op Assessment: Report given to RN and Post -op Vital signs reviewed and stable  Post vital signs: Reviewed and stable  Last Vitals:  Vitals Value Taken Time  BP    Temp    Pulse 81 10/08/19 1205  Resp 12 10/08/19 1205  SpO2 100 % 10/08/19 1205  Vitals shown include unvalidated device data.  Last Pain:  Vitals:   10/08/19 1007  TempSrc: Oral  PainSc: 0-No pain      Patients Stated Pain Goal: 5 (61/84/85 [id number])  Complications: No complications documented.

## 2019-10-08 NOTE — Op Note (Signed)
Preoperative diagnosis: Bladder Tumor  Postop diagnosis: Same  Procedure: 1.  Cystoscopy 2. Bilateral retrograde pyelography 3. Intra-operative fluoroscopy, under 1 hour, with interpretation 4.Transurethral resection of bladder tumor, large 5. Right 6x24 JJ ureteral Stent Placement     Attending: [name]  Anesthesia: General  Estimated blood loss: 5 cc  Drains: 1. 22 French Foley catheter 2. Right 6x24 JJ ureteral Stent  Specimens: Bladder tumor  Antibiotics: Ancef  Findings: 5cm dome/posterior wall tumor. 1cm papillary tumor involving the right ureteral orifice. No hydronephrosis on right or left  Indications: Patient is a 74 year old with a history of  bladder tumor found on office cystoscopy.   After discussing treatment options patient decided to proceed with transurethral resection of bladder tumor  Procedure in detail: Prior to procedure consetn was obtained. Patient was brought to the operating room and briefing was done sure correct patient, correct procedure, correct site.  General anesthesia was in administered patient was placed in the dorsal lithotomy position.  The rigid 23 French cystoscope was passed urethra and bladder.  Bladder was inspected masses or lesions and we noted a diffuse centimeter lesion on the right lateral wall as well as a 5 cm lesion on the posterior/dome and 1cm tumor involving the right ureteral orifice. The left ureteral orifice was cannulated with a 6 French ureteral catheter.  A gentle retrograde was obtained in findings noted above.   We were unable to identify the right ureteral orifice so we turned our attention to resecting the bladder tumors. We removed the cystoscope and placed a 27 French resectoscope in the bladder.  Using bipolar electrocautery were then removed the 2 bladder tumors.  We removed the bladder tumors down to the base exposing muscle.  We then removed the pieces and sent them for pathology.  To obtain hemostasis we then  cauterized the bed of the tumors. We were then abe to identify the right ureteral orifice.  We then cannulated the right ureteral orifice with a 6 French ureteral catheter.  A gentle retrograde was obtained in findings noted above.   We then placed a zip wire through the ureteral catheter and advanced up to the renal pelvis. We then placed a 6 x 24 double-J ureteral stent over the wire.  We then removed the wire and good coil was noted in the pelvis under fluoroscopy in the bladder under direct vision.   The bladder was then drained and a 20 French Foley catheter was placed. This concluded the procedure which resulted by the patient.  Complications: None  Condition: Stable,  extubated, transferred to PACU.  Plan: The patient is to return in 1 week for a voiding trial and then 2 weeks for stent removal based on pathology

## 2019-10-08 NOTE — Progress Notes (Signed)
Pt states he has not had his am meds for BP. Was given labetolol by prev RN, helped briefly.

## 2019-10-09 ENCOUNTER — Encounter (HOSPITAL_COMMUNITY): Payer: Self-pay | Admitting: Urology

## 2019-10-09 LAB — SURGICAL PATHOLOGY

## 2019-10-10 ENCOUNTER — Telehealth: Payer: Self-pay | Admitting: Urology

## 2019-10-10 NOTE — Telephone Encounter (Signed)
Pts wife called and LM regarding pain medication issue. States [facility] doesn't have pain suppositories that Dr Alyson Ingles wanted him to have.

## 2019-10-10 NOTE — Telephone Encounter (Signed)
Per wife neither [facility] or [facility] has pain medication suppository. Wife states the oxycodone oral pain medication is helping with pain at this time and will keep scheduled post op appt

## 2019-10-17 ENCOUNTER — Encounter: Payer: Self-pay | Admitting: Urology

## 2019-10-17 ENCOUNTER — Other Ambulatory Visit: Payer: Self-pay

## 2019-10-17 ENCOUNTER — Ambulatory Visit (INDEPENDENT_AMBULATORY_CARE_PROVIDER_SITE_OTHER): Payer: Medicare Other | Admitting: Urology

## 2019-10-17 VITALS — BP 192/91 | HR 77 | Temp 97.5°F | Wt 178.8 lb

## 2019-10-17 DIAGNOSIS — C671 Malignant neoplasm of dome of bladder: Secondary | ICD-10-CM | POA: Diagnosis not present

## 2019-10-17 MED ORDER — OXYCODONE-ACETAMINOPHEN 5-325 MG PO TABS: 1.0000 | ORAL_TABLET | ORAL | 0 refills | Status: DC | PRN

## 2019-10-17 NOTE — Progress Notes (Signed)
10/17/2019 9:42 AM   [name] 1945/06/23 [id number]  Referring provider: Wannetta Sender, FNP [facility] [address] [place],  [place] [address]  Bladder cancer  HPI: [name] is a 74yo here for followup after bladder tumor resection. 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 He has a right ureteral stent in place due to tumor involving the right ureteral orifice.    PMH: Past Medical History:  Diagnosis Date  . CAD (coronary artery disease)    a. 2007 - DES x 2 proximal to mid RCA b. 09/2017: DES to mid LAD and OM2. Patent stents along RCA.   . Essential hypertension   . History of atrial flutter    Status post ablation 2008 - Dr. Caryl Comes  . History of transient ischemic attack (TIA)   . Mixed hyperlipidemia    Statin intolerance  . Pneumonia   . Type 2 diabetes mellitus (HCC)    A1C 11.5 06/2017    Surgical History: Past Surgical History:  Procedure Laterality Date  . BALLOON ANGIOPLASTY, ARTERY    . CARDIAC ELECTROPHYSIOLOGY MAPPING AND ABLATION    . CARPAL TUNNEL RELEASE Bilateral   . cervical neck fusion     x 4  . CORONARY ANGIOPLASTY    . CORONARY STENT INTERVENTION N/A 10/17/2017   Procedure: CORONARY STENT INTERVENTION;  Surgeon: Martinique, Peter M, MD;  Location: [facility];  Service: Cardiovascular;  Laterality: N/A;  . CORONARY STENT PLACEMENT     x 2  . CYSTOSCOPY W/ RETROGRADES Bilateral 10/08/2019   Procedure: CYSTOSCOPY WITH BILATERAL RETROGRADE PYELOGRAM;  Surgeon: Cleon Gustin, MD;  Location: [facility];  Service: Urology;  Laterality: Bilateral;  . CYSTOSCOPY WITH URETHRAL DILATATION  10/08/2019   Procedure: CYSTOSCOPY WITH URETHRAL DILATATION;  Surgeon: Cleon Gustin, MD;  Location: [facility];  Service: Urology;;  . LEFT HEART CATH AND CORONARY ANGIOGRAPHY N/A 10/17/2017   Procedure: LEFT HEART CATH AND CORONARY ANGIOGRAPHY;  Surgeon: Martinique, Peter M, MD;  Location: [facility];  Service: Cardiovascular;  Laterality: N/A;  . ROTATOR CUFF REPAIR Right    x 4  . TIBIA FRACTURE SURGERY Left   . TRANSURETHRAL RESECTION OF BLADDER TUMOR N/A 10/08/2019   Procedure: TRANSURETHRAL RESECTION OF BLADDER TUMOR (TURBT);  Surgeon: Cleon Gustin, MD;  Location: [facility];  Service: Urology;  Laterality: N/A;    Home Medications:  Allergies as of 10/17/2019      Reactions   Statins    Leg pain, tolerates lipitor       Medication List       Accurate as of October 17, 2019  9:42 AM. If you have any questions, ask your nurse or doctor.        aspirin EC 81 MG tablet Take 81 mg by mouth daily. Swallow whole.   aspirin-sod bicarb-citric acid 325 MG Tbef tablet Commonly known as: ALKA-SELTZER Take 650 mg by mouth every 6 (six) hours as needed (indigestion).   atorvastatin 10 MG tablet Commonly known as: LIPITOR Take 10 mg by mouth daily.   belladonna-opium 16.2-30 MG suppository Commonly known as: B&O SUPPRETTES Place 1 suppository rectally 2 (two) times daily as needed for pain.   belladonna-opium 16.2-30 MG suppository Commonly known as: B&O SUPPRETTES Place 1 suppository rectally 2 (two) times daily as needed for pain.   belladonna-opium 16.2-30 MG suppository Commonly known as: B&O SUPPRETTES Place 1 suppository rectally 2 (two)  times daily as needed for pain.   glipiZIDE 10 MG 24 hr tablet Commonly known as: GLUCOTROL XL Take 10 mg by mouth 2 (two) times daily.   GOODY HEADACHE PO Take 1 packet by mouth daily as needed (pain).   irbesartan 300 MG tablet Commonly known as: AVAPRO Take 300 mg by mouth daily.   isosorbide mononitrate 30 MG 24 hr tablet Commonly known as: IMDUR Take 1 tablet (30 mg total) by mouth at bedtime.   metoprolol tartrate 100 MG tablet Commonly known as: LOPRESSOR Take 1 tablet (100 mg total) by mouth 2 (two) times daily.   nitroGLYCERIN 0.4 MG SL tablet Commonly known as: NITROSTAT Place 1 tablet (0.4  mg total) under the tongue every 5 (five) minutes as needed for chest pain.   oxyCODONE-acetaminophen 5-325 MG tablet Commonly known as: Percocet Take 1 tablet by mouth every 4 (four) hours as needed for moderate pain or severe pain.   pioglitazone 30 MG tablet Commonly known as: ACTOS Take 30 mg by mouth daily.   [place] 100-33 UNT-MCG/ML Sopn Generic drug: Insulin Glargine-Lixisenatide Inject 15 Units into the skin daily.       Allergies:  Allergies  Allergen Reactions  . Statins     Leg pain, tolerates lipitor     Family History: Family History  Problem Relation Age of Onset  . Diabetes Mother   . Heart disease Mother   . Heart disease Brother   . Emphysema Father   . Lung cancer Brother        x 2  . Colon cancer Neg Hx   . Esophageal cancer Neg Hx   . Rectal cancer Neg Hx   . Stomach cancer Neg Hx   . Prostate cancer Neg Hx     Social History:  reports that he has been smoking cigarettes. He has a 54.00 pack-year smoking history. He has never used smokeless tobacco. He reports that he does not drink alcohol and does not use drugs.  ROS: All other review of systems were reviewed and are negative except what is noted above in HPI  Physical Exam: BP (!) 192/91   Pulse 77   Temp (!) 97.5 F (36.4 C)   Wt 178 lb 12.8 oz (81.1 kg)   BMI 24.25 kg/m   Constitutional:  Alert and oriented, No acute distress. HEENT: [place] AT, moist mucus membranes.  Trachea midline, no masses. Cardiovascular: No clubbing, cyanosis, or edema. Respiratory: Normal respiratory effort, no increased work of breathing. GI: Abdomen is soft, nontender, nondistended, no abdominal masses GU: No CVA tenderness.  Lymph: No cervical or inguinal lymphadenopathy. Skin: No rashes, bruises or suspicious lesions. Neurologic: Grossly intact, no focal deficits, moving all 4 extremities. Psychiatric: Normal mood and affect.  Laboratory Data: Lab Results  Component Value Date   WBC 7.5 09/16/2019     HGB 13.0 09/17/2019   HCT 40.1 09/17/2019   MCV 89.1 09/16/2019   PLT 170 09/16/2019    Lab Results  Component Value Date   CREATININE 1.23 09/17/2019    No results found for: PSA  No results found for: TESTOSTERONE  Lab Results  Component Value Date   HGBA1C 10.6 (H) 09/16/2019    Urinalysis    Component Value Date/Time   COLORURINE AMBER (A) 09/15/2019 2324   APPEARANCEUR CLOUDY (A) 09/15/2019 2324   LABSPEC 1.042 (H) 09/15/2019 2324   PHURINE 5.0 09/15/2019 2324   GLUCOSEU 50 (A) 09/15/2019 2324   HGBUR LARGE (A) 09/15/2019 2324  BILIRUBINUR NEGATIVE 09/15/2019 2324   KETONESUR NEGATIVE 09/15/2019 2324   PROTEINUR 100 (A) 09/15/2019 2324   UROBILINOGEN 0.2 01/04/2007 1000   NITRITE NEGATIVE 09/15/2019 2324   LEUKOCYTESUR NEGATIVE 09/15/2019 2324    Lab Results  Component Value Date   WBCUA None seen 09/20/2019   LABEPIT None seen 09/20/2019   BACTERIA None seen 09/20/2019    Pertinent Imaging:  Results for orders placed during the hospital encounter of 11/14/12  DG Abd 1 View  Narrative CLINICAL DATA:  Abdominal pain and distention with nausea.  EXAM: ABDOMEN - 1 VIEW  COMPARISON:  None.  FINDINGS: The bowel gas pattern is normal. No radio-opaque calculi or other significant radiographic abnormality are seen.  IMPRESSION: Negative.   Electronically Signed By: Inge Rise M.D. On: 11/16/2012 21:57  No results found for this or any previous visit.  No results found for this or any previous visit.  No results found for this or any previous visit.  No results found for this or any previous visit.  No results found for this or any previous visit.  No results found for this or any previous visit.  No results found for this or any previous visit.   Assessment & Plan:    1. 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 After the discussion the patient elects to see the medical oncologist, radiation oncologist and urologic oncologist.    No follow-ups on file.  [name], MD  [facility]

## 2019-10-17 NOTE — Patient Instructions (Signed)
Bladder Cancer  Bladder cancer is an abnormal growth of tissue in the bladder. The bladder is the balloon-like sac in the pelvis. It collects and stores urine that comes from the kidneys through the ureters. The bladder wall is made of layers. If cancer spreads into these layers and through the wall of the bladder, it becomes more difficult to treat. What are the causes? The cause of this condition is not known. What increases the risk? The following factors may make you more likely to develop this condition:  Smoking.  Workplace risks (occupational exposures), such as rubber, leather, textile, dyes, chemicals, and paint.  Being white.  Your age. Most people with bladder cancer are over the age of 55.  Being male.  Having chronic bladder inflammation.  Having a personal history of bladder cancer.  Having a family history of bladder cancer (heredity).  Having had chemotherapy or radiation therapy to the pelvis.  Having been exposed to arsenic. What are the signs or symptoms? Initial symptoms of this condition include:  Blood in the urine.  Painful urination.  Frequent bladder or urine infections.  Increase in urgency and frequency of urination. Advanced symptoms of this condition include:  Not being able to urinate.  Low back pain on one side.  Loss of appetite.  Weight loss.  Fatigue.  Swelling in the feet.  Bone pain. How is this diagnosed? This condition is diagnosed based on your medical history, a physical exam, urine tests, lab tests, imaging tests, and your symptoms. You may also have other tests or procedures done, such as:  A narrow tube being inserted into your bladder through your urethra (cystoscopy) in order to view the lining of your bladder for tumors.  A biopsy to sample the tumor to see if cancer is present. If cancer is present, it will then be staged to determine its severity and extent. Staging is an assessment of:  The size of the  tumor.  Whether the cancer has spread.  Where the cancer has spread. It is important to know how deeply into the bladder wall cancer has grown and whether cancer has spread to any other parts of your body. Staging may require blood tests or imaging tests, such as a CT scan, MRI, bone scan, or chest X-ray. How is this treated? Based on the stage of cancer, one treatment or a combination of treatments may be recommended. The most common forms of treatment are:  Surgery to remove the cancer. Procedures that may be done include transurethral resection and cystectomy.  Radiation therapy. This is high-energy X-rays or other particles. This is often used in combination with chemotherapy.  Chemotherapy. During this treatment, medicines are used to kill cancer cells.  Immunotherapy. This uses medicines to help your own immune system destroy cancer cells. Follow these instructions at home:  Take over-the-counter and prescription medicines only as told by your health care provider.  Maintain a healthy diet. Some of your treatments might affect your appetite.  Consider joining a support group. This may help you learn to cope with the stress of having bladder cancer.  Tell your cancer care team if you develop side effects. They may be able to recommend ways to relieve them.  Keep all follow-up visits as told by your health care provider. This is important. Where to find more information  American Cancer Society: www.cancer.org  National Cancer Institute (NCI): www.cancer.gov Contact a health care provider if:  You have symptoms of a urinary tract infection. These include: ?   Fever. ? Chills. ? Weakness. ? Muscle aches. ? Abdominal pain. ? Frequent and intense urge to urinate. ? Burning feeling in the bladder or urethra during urination. Get help right away if:  There is blood in your urine.  You cannot urinate.  You have severe pain or other symptoms that do not go  away. Summary  Bladder cancer is an abnormal growth of tissue in the bladder.  This condition is diagnosed based on your medical history, a physical exam, urine tests, lab tests, imaging tests, and your symptoms.  Based on the stage of cancer, surgery, chemotherapy, or a combination of treatments may be recommended.  Consider joining a support group. This may help you learn to cope with the stress of having bladder cancer. This information is not intended to replace advice given to you by your health care provider. Make sure you discuss any questions you have with your health care provider. Document Revised: 01/21/2017 Document Reviewed: 01/13/2016 Elsevier Patient Education  2020 Elsevier Inc.  

## 2019-10-17 NOTE — Progress Notes (Signed)
Urological Symptom Review  Patient is experiencing the following symptoms: None    Review of Systems  Gastrointestinal (upper)  : Negative for upper GI symptoms  Gastrointestinal (lower) : Negative for lower GI symptoms  Constitutional : Negative for symptoms  Skin: Negative for skin symptoms  Eyes: Negative for eye symptoms  Ear/Nose/Throat : Negative for Ear/Nose/Throat symptoms  Hematologic/Lymphatic: Negative for Hematologic/Lymphatic symptoms  Cardiovascular : Leg swelling  Respiratory : Negative for respiratory symptoms  Endocrine: Negative for endocrine symptoms  Musculoskeletal: Negative for musculoskeletal symptoms  Neurological: Negative for neurological symptoms  Psychologic: Negative for psychiatric symptoms   Fill and Pull Catheter Removal  Patient is present today for a catheter removal.  Patient was cleaned and prepped in a sterile fashion 17ml of sterile water/ saline was instilled into the bladder when the patient felt the urge to urinate. 30ml of water was then drained from the balloon.  A 22FR foley cath was removed from the bladder no complications were noted .  Patient as then given some time to void on their own.  Patient can void  134ml on their own after some time.  Patient tolerated well.  Performed by: Eryn Marandola,LPN   Follow up/ Additional notes: Per MD note

## 2019-10-26 ENCOUNTER — Encounter (HOSPITAL_COMMUNITY): Payer: Self-pay

## 2019-10-26 NOTE — Progress Notes (Signed)
I have attempted to place an introductory phone call to this patient. Patient unavailable at this time. Unable to leave a message. I will plan to meet with the patient during his initial visit with Dr. Delton Coombes.

## 2019-10-30 ENCOUNTER — Inpatient Hospital Stay (HOSPITAL_COMMUNITY): Payer: Medicare Other | Attending: Hematology | Admitting: Hematology

## 2019-10-30 ENCOUNTER — Encounter: Payer: Self-pay | Admitting: Radiation Oncology

## 2019-10-30 DIAGNOSIS — E782 Mixed hyperlipidemia: Secondary | ICD-10-CM | POA: Insufficient documentation

## 2019-10-30 DIAGNOSIS — Z7984 Long term (current) use of oral hypoglycemic drugs: Secondary | ICD-10-CM | POA: Insufficient documentation

## 2019-10-30 DIAGNOSIS — R634 Abnormal weight loss: Secondary | ICD-10-CM | POA: Insufficient documentation

## 2019-10-30 DIAGNOSIS — Z7982 Long term (current) use of aspirin: Secondary | ICD-10-CM | POA: Insufficient documentation

## 2019-10-30 DIAGNOSIS — E1140 Type 2 diabetes mellitus with diabetic neuropathy, unspecified: Secondary | ICD-10-CM | POA: Insufficient documentation

## 2019-10-30 DIAGNOSIS — I10 Essential (primary) hypertension: Secondary | ICD-10-CM | POA: Insufficient documentation

## 2019-10-30 DIAGNOSIS — Z79899 Other long term (current) drug therapy: Secondary | ICD-10-CM | POA: Insufficient documentation

## 2019-10-30 DIAGNOSIS — C679 Malignant neoplasm of bladder, unspecified: Secondary | ICD-10-CM | POA: Insufficient documentation

## 2019-10-30 DIAGNOSIS — R05 Cough: Secondary | ICD-10-CM | POA: Insufficient documentation

## 2019-10-30 DIAGNOSIS — I2510 Atherosclerotic heart disease of native coronary artery without angina pectoris: Secondary | ICD-10-CM | POA: Insufficient documentation

## 2019-10-30 DIAGNOSIS — F17210 Nicotine dependence, cigarettes, uncomplicated: Secondary | ICD-10-CM | POA: Insufficient documentation

## 2019-10-30 NOTE — Progress Notes (Signed)
GU Location of Tumor / Histology: Muscle invasive bladder cancer  Richard [name]. Malkin presented to the emergency room on 09/15/19 with gross hematuria. CT done 09/16/19 revealed a 2.7 cm bladder mass.   FINAL MICROSCOPIC DIAGNOSIS:  A. 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 The specimen is entirely  submitted in 4 cassettes. Richard [name] 10/09/2019)   Past/Anticipated interventions by urology, if any: TURBT with right ureteral stent placement, referral to medical oncologist (Dr. Delton Coombes), radiation oncologist (Dr. Tammi Klippel), and a urologic oncologist. Follow up with Dr. Alyson Ingles scheduled for 11/02/2019.Struggles with constipation.   Past/Anticipated interventions by medical oncology, if any: Evaluated by Dr. Delton Coombes on 10/30/19.  Weight changes, if any: 20 lb in six months  Bowel/Bladder complaints, if any: Improving dysuria. Hematuria resolved. Nocturia every 2-3 hours.    Nausea/Vomiting, if any: denies  Pain issues, if any:  denies  SAFETY ISSUES:  Prior radiation? denies  Pacemaker/ICD? denies  Possible current pregnancy? no, male patient  Is the patient on methotrexate? 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 Married.  54 pk-year smoker. Complains of significant fatigue. Wife reports he stumbles often due to weakness.

## 2019-10-31 ENCOUNTER — Other Ambulatory Visit: Payer: Self-pay

## 2019-10-31 ENCOUNTER — Encounter: Payer: Self-pay | Admitting: Radiation Oncology

## 2019-10-31 ENCOUNTER — Ambulatory Visit
Admission: RE | Admit: 2019-10-31 | Discharge: 2019-10-31 | Disposition: A | Discharge status: Home / Self Care | Payer: Medicare Other | Source: Ambulatory Visit | Attending: Radiation Oncology | Admitting: Radiation Oncology

## 2019-10-31 VITALS — Ht 72.0 in | Wt 177.0 lb

## 2019-10-31 DIAGNOSIS — C671 Malignant neoplasm of dome of bladder: Secondary | ICD-10-CM | POA: Diagnosis not present

## 2019-10-31 HISTORY — DX: Malignant neoplasm of bladder, unspecified: C67.9

## 2019-10-31 NOTE — Progress Notes (Signed)
Radiation Oncology         ([phone]) [phone] ________________________________  Initial outpatient Consultation- Conducted via telephone due to current COVID-19 concerns for limiting patient exposure  Name: Richard [name] MRN: [id number]  Date of Service: 10/31/2019 DOB: 05/17/1945  RK:[id number], Anabel Bene, FNP  McKenzie, Candee Furbish, MD   REFERRING PHYSICIAN: Cleon Gustin, MD  DIAGNOSIS: 74 y.o. 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    ICD-10-CM   1. 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 male seen at the request of Dr. Alyson Ingles.  He initially presented to the emergency department at [facility] on 09/15/2019 with a sudden onset of weakness, fatigue and headache.  CTA head and neck and a CT head were performed and did not show any evidence of CVA or other acute abnormalities.  The patient reported that he had been having some gross hematuria for the past 2-3 weeks and therefore a CT A/P was performed on 09/16/2019 for further evaluation. This study showed an enhancing, 2.7 cm endoluminal mass of the left anterior bladder dome, highly concerning for bladder malignancy.  An MRI of the brain was also performed on 09/16/2019 and this was WNL.  He was discharged home on 09/17/2019 and referred for outpatient evaluation with Dr. Alyson Ingles.  He saw Dr. Alyson Ingles in consult on 09/20/2019 with in office cystoscopy confirming a 3 cm posterior wall bladder tumor.  He then proceeded with a TURBT on 10/08/2019 with excision of a 5 cm bladder dome lesion as well as a 1 cm right ureteral orifice lesion and a right DJ stent placement.  Final surgical pathology confirmed a T2, high-grade invasive papillary urothelial carcinoma with squamous cell differentiation and invasion into the detrusor muscle.  He was seen in follow-up with Dr. Alyson Ingles on  10/17/2019 to review pathology results and treatment options.  They discussed the possibility for curative treatment with a radical cystectomy versus a bladder sparing approach with chemoradiation. He is scheduled for consult with Dr. Tresa Moore on 11/22/19 to discuss surgery but does not feel like he could tolerate a big procedure at this point. However, he is open for discussion.  He has therefore been kindly referred to [place] today for discussion of radiation treatment options.  He was also referred to medical oncology and was scheduled to meet with Dr. Delton Coombes on 10/30/2019 but missed his appointment due to severe fatigue/malaise and feeling overwhelmed.  PREVIOUS RADIATION THERAPY: No  PAST MEDICAL HISTORY:  Past Medical History:  Diagnosis Date  . Bladder cancer ([place])   . CAD (coronary artery disease)    a. 2007 - DES x 2 proximal to mid RCA b. 09/2017: DES to mid LAD and OM2. Patent stents along RCA.   . Essential hypertension   . History of atrial flutter    Status post ablation 2008 - Dr. Caryl Comes  . History of transient ischemic attack (TIA)   . Mixed hyperlipidemia    Statin intolerance  . Pneumonia   . Type 2 diabetes mellitus ([place])    A1C 11.5 06/2017      PAST SURGICAL HISTORY: Past Surgical History:  Procedure Laterality Date  . BALLOON ANGIOPLASTY, ARTERY    . CARDIAC ELECTROPHYSIOLOGY MAPPING AND ABLATION    . CARPAL TUNNEL RELEASE Bilateral   . cervical neck fusion     x 4  .  CORONARY ANGIOPLASTY    . CORONARY STENT INTERVENTION N/A 10/17/2017   Procedure: CORONARY STENT INTERVENTION;  Surgeon: Martinique, Peter M, MD;  Location: [facility];  Service: Cardiovascular;  Laterality: N/A;  . CORONARY STENT PLACEMENT     x 2  . CYSTOSCOPY W/ RETROGRADES Bilateral 10/08/2019   Procedure: CYSTOSCOPY WITH BILATERAL RETROGRADE PYELOGRAM;  Surgeon: Cleon Gustin, MD;  Location: [facility];  Service: Urology;  Laterality: Bilateral;  . CYSTOSCOPY WITH URETHRAL DILATATION   10/08/2019   Procedure: CYSTOSCOPY WITH URETHRAL DILATATION;  Surgeon: Cleon Gustin, MD;  Location: [facility];  Service: Urology;;  . LEFT HEART CATH AND CORONARY ANGIOGRAPHY N/A 10/17/2017   Procedure: LEFT HEART CATH AND CORONARY ANGIOGRAPHY;  Surgeon: Martinique, Peter M, MD;  Location: [place] [facility];  Service: Cardiovascular;  Laterality: N/A;  . ROTATOR CUFF REPAIR Right    x 4  . TIBIA FRACTURE SURGERY Left   . TRANSURETHRAL RESECTION OF BLADDER TUMOR N/A 10/08/2019   Procedure: TRANSURETHRAL RESECTION OF BLADDER TUMOR (TURBT);  Surgeon: Cleon Gustin, MD;  Location: [facility];  Service: Urology;  Laterality: N/A;    FAMILY HISTORY:  Family History  Problem Relation Age of Onset  . Diabetes Mother   . Heart disease Mother   . Heart disease Brother   . Emphysema Father   . Lung cancer Brother        x 2  . Colon cancer Neg Hx   . Esophageal cancer Neg Hx   . Rectal cancer Neg Hx   . Stomach cancer Neg Hx   . Prostate cancer Neg Hx     SOCIAL HISTORY:  Social History   Socioeconomic History  . Marital status: Married    Spouse name: Not on file  . Number of children: 4  . Years of education: Not on file  . Highest education level: Not on file  Occupational History  . Occupation: truck Geophysicist/field seismologist  Tobacco Use  . Smoking status: Current Every Day Smoker    Packs/day: 1.00    Years: 54.00    Pack years: 54.00    Types: Cigarettes    Last attempt to quit: 07/11/2017    Years since quitting: 2.3  . Smokeless tobacco: Never Used  Vaping Use  . Vaping Use: Never used  Substance and Sexual Activity  . Alcohol use: No    Alcohol/week: 0.0 standard drinks    Comment: rare  . Drug use: No  . Sexual activity: Not Currently  Other Topics Concern  . Not on file  Social History Narrative  . Not on file   Social Determinants of Health   Financial Resource Strain:   . Difficulty of Paying Living Expenses: Not on file  Food Insecurity:   . Worried About Ship broker in the Last Year: Not on file  . Ran Out of Food in the Last Year: Not on file  Transportation Needs:   . Lack of Transportation (Medical): Not on file  . Lack of Transportation (Non-Medical): Not on file  Physical Activity:   . Days of Exercise per Week: Not on file  . Minutes of Exercise per Session: Not on file  Stress:   . Feeling of Stress : Not on file  Social Connections:   . Frequency of Communication with Friends and Family: Not on file  . Frequency of Social Gatherings with Friends and Family: Not on file  . Attends Religious Services: Not on file  .  Active Member of Clubs or Organizations: Not on file  . Attends Archivist Meetings: Not on file  . Marital Status: Not on file  Intimate Partner Violence:   . Fear of Current or Ex-Partner: Not on file  . Emotionally Abused: Not on file  . Physically Abused: Not on file  . Sexually Abused: Not on file    ALLERGIES: Statins  MEDICATIONS:  Current Outpatient Medications  Medication Sig Dispense Refill  . aspirin EC 81 MG tablet Take 81 mg by mouth daily. Swallow whole.    . Aspirin-Acetaminophen-Caffeine (GOODY HEADACHE PO) Take 1 packet by mouth daily as needed (pain).    [name] aspirin-sod bicarb-citric acid (ALKA-SELTZER) 325 MG TBEF tablet Take 650 mg by mouth every 6 (six) hours as needed (indigestion).    [name] atorvastatin (LIPITOR) 10 MG tablet Take 10 mg by mouth daily.    . belladonna-opium (B&O SUPPRETTES) 16.2-30 MG suppository Place 1 suppository rectally 2 (two) times daily as needed for pain. 6 suppository 0  . belladonna-opium (B&O SUPPRETTES) 16.2-30 MG suppository Place 1 suppository rectally 2 (two) times daily as needed for pain. 6 suppository 0  . belladonna-opium (B&O SUPPRETTES) 16.2-30 MG suppository Place 1 suppository rectally 2 (two) times daily as needed for pain. 6 suppository 0  . glipiZIDE (GLUCOTROL XL) 10 MG 24 hr tablet Take 10 mg by mouth 2 (two) times daily.    . irbesartan  (AVAPRO) 300 MG tablet Take 300 mg by mouth daily.    . isosorbide mononitrate (IMDUR) 30 MG 24 hr tablet Take 1 tablet (30 mg total) by mouth at bedtime. 30 tablet 6  . metoprolol tartrate (LOPRESSOR) 100 MG tablet Take 1 tablet (100 mg total) by mouth 2 (two) times daily. 60 tablet 6  . nitroGLYCERIN (NITROSTAT) 0.4 MG SL tablet Place 1 tablet (0.4 mg total) under the tongue every 5 (five) minutes as needed for chest pain. 25 tablet 3  . oxyCODONE-acetaminophen (PERCOCET) 5-325 MG tablet Take 1 tablet by mouth every 4 (four) hours as needed for moderate pain or severe pain. 30 tablet 0  . pioglitazone (ACTOS) 30 MG tablet Take 30 mg by mouth daily.    Willeen Niece 100-33 UNT-MCG/ML SOPN Inject 15 Units into the skin daily.      No current facility-administered medications for this encounter.    REVIEW OF SYSTEMS:  On review of systems, the patient reports that he is doing fair overall.  He continues with generalized weakness but denies any further gross hematuria, headaches or difficulty with speech.  He denies any chest pain, shortness of breath, cough, fevers, chills, or night sweats.  He has had a 20 lb unintentional weight loss over the past 6 months  He denies any bowel or bladder disturbances, and denies abdominal pain, nausea or vomiting.  He denies any new musculoskeletal or joint aches or pains. A complete review of systems is obtained and is otherwise negative.    PHYSICAL EXAM:  Wt Readings from Last 3 Encounters:  10/17/19 178 lb 12.8 oz (81.1 kg)  09/27/19 178 lb 12.8 oz (81.1 kg)  09/20/19 179 lb (81.2 kg)   Temp Readings from Last 3 Encounters:  10/17/19 (!) 97.5 F (36.4 C)  10/08/19 (!) 97.5 F (36.4 C)  09/20/19 98 F (36.7 C)   BP Readings from Last 3 Encounters:  10/17/19 (!) 192/91  10/08/19 (!) 172/93  09/27/19 140/70   Pulse Readings from Last 3 Encounters:  10/17/19 77  10/08/19 82  09/27/19  98    /10  Unable to assess due to telephone consult visit  format.   KPS = 70  100 - Normal; no complaints; no evidence of disease. 90   - Able to carry on normal activity; minor signs or symptoms of disease. 80   - Normal activity with effort; some signs or symptoms of disease. 53   - Cares for self; unable to carry on normal activity or to do active work. 60   - Requires occasional assistance, but is able to care for most of his personal needs. 50   - Requires considerable assistance and frequent medical care. 5   - Disabled; requires special care and assistance. 38   - Severely disabled; hospital admission is indicated although death not imminent. 92   - Very sick; hospital admission necessary; active supportive treatment necessary. 10   - Moribund; fatal processes progressing rapidly. 0     - Dead  Karnofsky DA, Abelmann Yutan, Craver LS and Burchenal [facility] [phone]) The use of the nitrogen mustards in the palliative treatment of carcinoma: with particular reference to bronchogenic carcinoma Cancer 1 634-56  LABORATORY DATA:  Lab Results  Component Value Date   WBC 7.5 09/16/2019   HGB 13.0 09/17/2019   HCT 40.1 09/17/2019   MCV 89.1 09/16/2019   PLT 170 09/16/2019   Lab Results  Component Value Date   NA 140 09/17/2019   K 4.3 09/17/2019   CL 105 09/17/2019   CO2 27 09/17/2019   Lab Results  Component Value Date   ALT 21 09/16/2019   AST 15 09/16/2019   ALKPHOS 83 09/16/2019   BILITOT 0.4 09/16/2019     RADIOGRAPHY: DG C-Arm 1-60 Min-No Report  Result Date: 10/08/2019 Fluoroscopy was utilized by the requesting physician.  No radiographic interpretation.      IMPRESSION/PLAN:  This visit was conducted via telephone to spare the patient unnecessary potential exposure in the healthcare setting during the current COVID-19 pandemic. 50. 74 y.o. 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  Today, we talked to the patient and family about the findings and  workup thus far. We discussed the natural history of muscle invasive bladder carcinoma and general treatment, highlighting the role of radiotherapy in the management. We discussed that while surgery remains the gold standard in this setting, another highly effective treatment option is concurrent chemoradiation for a bladder sparing approach, particularly for patients that are not ideal surgical candidates.  We discussed the available radiation techniques, and focused on the details and logistics of delivery. The recommendation is for a 6.5 - 7 week course of daily radiotherapy concurrent with systemic chemotherapy. We reviewed the anticipated acute and late sequelae associated with radiation in this setting. The patient was encouraged to ask questions that were answered to his stated satisfaction.  At the conclusion of our discussion, the patient remains undecided regarding his treatment preference. He is interested in rescheduling the consult visit for further discussion with Dr. Delton Coombes and is planning to keep his scheduled consult visit with Dr. Tresa Moore on 11/22/2019.  We we will share our discussion with Dr. Alyson Ingles, Dr. Tresa Moore and Dr. Delton Coombes and will plan to follow-up with him in the next 2 to 3 weeks if we have not heard from him sooner regarding treatment preference.  Should he elect to proceed with concurrent chemoradiation, we are more than happy to continue to participate in his care and will proceed with treatment planning accordingly  at that time.  We enjoyed meeting him today and look forward to continuing to participate in his care.  He knows that he is welcome to call with any questions or concerns in the interim.   Given current concerns for patient exposure during the COVID-19 pandemic, this encounter was conducted via telephone. The patient was notified in advance and was offered a [place] meeting to allow for face to face communication but unfortunately reported that he did not have the  appropriate resources/technology to support such a visit and instead preferred to proceed with telephone consult. The patient has given verbal consent for this type of encounter. The time spent during this encounter was 45 minutes. The attendants for this meeting include Tyler Pita MD, Ashlyn Bruning PA-C, patient, Burnie Therien and family members. During the encounter, Tyler Pita MD, and Freeman Caldron, PA-C, were located at [facility] Radiation Oncology Department.  Patient, Xeng Kucher and family, were located at home.   Nicholos Johns, PA-C    Tyler Pita, MD  [facility] Direct Dial: [phone]  Fax: [phone] [email or web address]  Skype  LinkedIn

## 2019-11-02 ENCOUNTER — Ambulatory Visit: Payer: Medicare Other | Admitting: Urology

## 2019-11-06 ENCOUNTER — Inpatient Hospital Stay (HOSPITAL_COMMUNITY): Payer: Medicare Other

## 2019-11-06 ENCOUNTER — Encounter (HOSPITAL_COMMUNITY): Payer: Self-pay | Admitting: Hematology

## 2019-11-06 ENCOUNTER — Inpatient Hospital Stay (HOSPITAL_COMMUNITY): Payer: Medicare Other | Admitting: Hematology

## 2019-11-06 ENCOUNTER — Other Ambulatory Visit: Payer: Self-pay

## 2019-11-06 VITALS — BP 158/98 | HR 87 | Temp 97.5°F | Resp 18 | Ht 72.0 in | Wt 176.8 lb

## 2019-11-06 DIAGNOSIS — E1140 Type 2 diabetes mellitus with diabetic neuropathy, unspecified: Secondary | ICD-10-CM | POA: Diagnosis not present

## 2019-11-06 DIAGNOSIS — I10 Essential (primary) hypertension: Secondary | ICD-10-CM | POA: Diagnosis not present

## 2019-11-06 DIAGNOSIS — Z7982 Long term (current) use of aspirin: Secondary | ICD-10-CM | POA: Diagnosis not present

## 2019-11-06 DIAGNOSIS — Z7984 Long term (current) use of oral hypoglycemic drugs: Secondary | ICD-10-CM | POA: Diagnosis not present

## 2019-11-06 DIAGNOSIS — R05 Cough: Secondary | ICD-10-CM | POA: Diagnosis not present

## 2019-11-06 DIAGNOSIS — D494 Neoplasm of unspecified behavior of bladder: Secondary | ICD-10-CM | POA: Diagnosis not present

## 2019-11-06 DIAGNOSIS — E782 Mixed hyperlipidemia: Secondary | ICD-10-CM | POA: Diagnosis not present

## 2019-11-06 DIAGNOSIS — I2510 Atherosclerotic heart disease of native coronary artery without angina pectoris: Secondary | ICD-10-CM | POA: Diagnosis not present

## 2019-11-06 DIAGNOSIS — R634 Abnormal weight loss: Secondary | ICD-10-CM | POA: Diagnosis not present

## 2019-11-06 DIAGNOSIS — Z79899 Other long term (current) drug therapy: Secondary | ICD-10-CM | POA: Diagnosis not present

## 2019-11-06 DIAGNOSIS — F17210 Nicotine dependence, cigarettes, uncomplicated: Secondary | ICD-10-CM | POA: Diagnosis not present

## 2019-11-06 DIAGNOSIS — C679 Malignant neoplasm of bladder, unspecified: Secondary | ICD-10-CM | POA: Diagnosis not present

## 2019-11-06 LAB — CBC WITH DIFFERENTIAL/PLATELET
Abs Immature Granulocytes: 0.01 10*3/uL (ref 0.00–0.07)
Basophils Absolute: 0.1 10*3/uL (ref 0.0–0.1)
Basophils Relative: 1 %
Eosinophils Absolute: 0.3 10*3/uL (ref 0.0–0.5)
Eosinophils Relative: 4 %
HCT: 43 % (ref 39.0–52.0)
Hemoglobin: 13.6 g/dL (ref 13.0–17.0)
Immature Granulocytes: 0 %
Lymphocytes Relative: 19 %
Lymphs Abs: 1.4 10*3/uL (ref 0.7–4.0)
MCH: 28.5 pg (ref 26.0–34.0)
MCHC: 31.6 g/dL (ref 30.0–36.0)
MCV: 90.1 fL (ref 80.0–100.0)
Monocytes Absolute: 0.6 10*3/uL (ref 0.1–1.0)
Monocytes Relative: 8 %
Neutro Abs: 4.9 10*3/uL (ref 1.7–7.7)
Neutrophils Relative %: 68 %
Platelets: 217 10*3/uL (ref 150–400)
RBC: 4.77 MIL/uL (ref 4.22–5.81)
RDW: 14.1 % (ref 11.5–15.5)
WBC: 7.3 10*3/uL (ref 4.0–10.5)
nRBC: 0 % (ref 0.0–0.2)

## 2019-11-06 LAB — COMPREHENSIVE METABOLIC PANEL
ALT: 19 U/L (ref 0–44)
AST: 17 U/L (ref 15–41)
Albumin: 3.8 g/dL (ref 3.5–5.0)
Alkaline Phosphatase: 108 U/L (ref 38–126)
Anion gap: 9 (ref 5–15)
BUN: 28 mg/dL — ABNORMAL HIGH (ref 8–23)
CO2: 30 mmol/L (ref 22–32)
Calcium: 9.5 mg/dL (ref 8.9–10.3)
Chloride: 99 mmol/L (ref 98–111)
Creatinine, Ser: 1.26 mg/dL — ABNORMAL HIGH (ref 0.61–1.24)
GFR calc Af Amer: >60 mL/min (ref >60)
GFR calc non Af Amer: 56 mL/min — ABNORMAL LOW (ref >60)
Glucose, Bld: 269 mg/dL — ABNORMAL HIGH (ref 70–99)
Potassium: 3.9 mmol/L (ref 3.5–5.1)
Sodium: 138 mmol/L (ref 135–145)
Total Bilirubin: 0.5 mg/dL (ref 0.3–1.2)
Total Protein: 7.2 g/dL (ref 6.5–8.1)

## 2019-11-06 NOTE — Progress Notes (Signed)
I met with patient during the visit today.  I explained the role of navigation in his care and provided him with our office's navigator's contact information.  I discussed with him the recommendations from Dr. Delton Coombes during today's visit.  I explained that if he has any questions or concerns to please reach out to the navigator.  HIm and his wife were given the opportunity to ask questions and all were answered to their satisfaction.

## 2019-11-06 NOTE — Patient Instructions (Addendum)
[place] at [facility] Discharge Instructions  You were seen today by Dr. Delton Coombes.  He is a Futures trader.  He talked with you about your history, your family history, and what events have led you to [place].  He talked with you about the options for treatment.  We will need additional scans to determine if the cancer has spread to any other areas of your body.  You will still see Dr. Tresa Moore and then decide from that point what you might want to do for treatment.  We will see you back after that visit to talk again.     Thank you for choosing [place] at [facility] to provide your oncology and hematology care.  To afford each patient quality time with our provider, please arrive at least 15 minutes before your scheduled appointment time.   If you have a lab appointment with the [facility] please come in thru the Main Entrance and check in at the main information desk.  You need to re-schedule your appointment should you arrive 10 or more minutes late.  We strive to give you quality time with our providers, and arriving late affects you and other patients whose appointments are after yours.  Also, if you no show three or more times for appointments you may be dismissed from the clinic at the providers discretion.     Again, thank you for choosing [facility].  Our hope is that these requests will decrease the amount of time that you wait before being seen by our physicians.       _____________________________________________________________  Should you have questions after your visit to [facility], please contact our office at [phone] and follow the prompts.  Our office hours are 8:00 a.m. and 4:30 p.m. Monday - Friday.  Please note that voicemails left after 4:00 p.m. may not be returned until the following business day.  We are closed weekends and major holidays.  You do have access to a nurse 24-7, just call  the main number to the clinic [phone] and do not press any options, hold on the line and a nurse will answer the phone.    For prescription refill requests, have your pharmacy contact our office and allow 72 hours.    Due to Covid, you will need to wear a mask upon entering the hospital. If you do not have a mask, a mask will be given to you at the Main Entrance upon arrival. For doctor visits, patients may have 1 support person age 45 or older with them. For treatment visits, patients can not have anyone with them due to social distancing guidelines and our immunocompromised population.

## 2019-11-06 NOTE — Progress Notes (Signed)
[facility] [address], [place] [address]   CLINIC:  Medical Oncology/Hematology  CONSULT NOTE  Patient Care Team: Richard Sender, FNP as PCP - General (Family Medicine) Richard Sark, [name] as PCP - Cardiology (Cardiology) Richard [name], Richard Brothers, RN as Oncology Nurse Navigator (Oncology)  CHIEF COMPLAINTS/PURPOSE OF CONSULTATION:  Evaluation of bladder cancer  HISTORY OF PRESENTING ILLNESS:  [name] 74 y.o. male is here because of evaluation of bladder cancer, at the request of Richard [name] from [facility] Richard [name]. He had a transurethral resection of bladder tumor on 8/16, along with a right ureteral stent placed due to tumor involving the right ureteral orifice.  Today he is accompanied by his wife, Richard [name]. He reports that his hematuria has resolved and he denies having any difficulty urinating. He has mild, intermittent numbness in his toes due to his poorly controlled diabetes, especially in the cold. He has lost approximately 20 lbs in the last 6 months unexpectedly, even though his appetite is good and has not changed. He has an occasional cough which has not changed. He denies having any new aches or pains.  He is scheduled to see Dr. Tammi [name] on 9/30. He is able to garden a little bit and do his chores. He worked as a Administrator for 50 years. He continues smoking 1 PPD for 57 years. Both [name] had lung cancer since both were smokers; a granddaughter passed away from a neuroblastoma at the age of 3 years.  MEDICAL HISTORY:  Past Medical History:  Diagnosis Date  . Bladder cancer ([facility])   . CAD (coronary artery disease)    a. 2007 - DES x 2 proximal to mid RCA b. 09/2017: DES to mid LAD and OM2. Patent stents along RCA.   . Essential hypertension   . History of atrial flutter    Status post ablation 2008 - Dr. Caryl [name]  . History of transient ischemic attack (TIA)   . Mixed hyperlipidemia    Statin intolerance  .  Pneumonia   . Type 2 diabetes mellitus ([facility])    A1C 11.5 06/2017    SURGICAL HISTORY: Past Surgical History:  Procedure Laterality Date  . BALLOON ANGIOPLASTY, ARTERY    . BICEPS TENDON REPAIR Left   . CARDIAC ELECTROPHYSIOLOGY MAPPING AND ABLATION    . CARPAL TUNNEL RELEASE Bilateral   . cervical neck fusion     x 4  . CORONARY ANGIOPLASTY    . CORONARY STENT INTERVENTION N/A 10/17/2017   Procedure: CORONARY STENT INTERVENTION;  Surgeon: Richard [name], Richard [name];  Location: [facility];  Service: Cardiovascular;  Laterality: N/A;  . CORONARY STENT PLACEMENT     x 2  . CYSTOSCOPY W/ RETROGRADES Bilateral 10/08/2019   Procedure: CYSTOSCOPY WITH BILATERAL RETROGRADE PYELOGRAM;  Surgeon: Richard Gustin, [name];  Location: [facility];  Service: Urology;  Laterality: Bilateral;  . CYSTOSCOPY WITH URETHRAL DILATATION  10/08/2019   Procedure: CYSTOSCOPY WITH URETHRAL DILATATION;  Surgeon: Richard Gustin, [name];  Location: [facility];  Service: Urology;;  . LEFT HEART CATH AND CORONARY ANGIOGRAPHY N/A 10/17/2017   Procedure: LEFT HEART CATH AND CORONARY ANGIOGRAPHY;  Surgeon: Richard [name], Richard [name];  Location: [facility];  Service: Cardiovascular;  Laterality: N/A;  . ROTATOR CUFF REPAIR Right    x 4  . TIBIA FRACTURE SURGERY Left   . TRANSURETHRAL RESECTION OF BLADDER TUMOR N/A 10/08/2019   Procedure: TRANSURETHRAL RESECTION OF BLADDER TUMOR (  TURBT);  Surgeon: Richard Gustin, [name];  Location: [facility];  Service: Urology;  Laterality: N/A;    SOCIAL HISTORY: Social History   Socioeconomic History  . Marital status: Married    Spouse name: Not on file  . Number of children: 4  . Years of education: Not on file  . Highest education level: Not on file  Occupational History  . Occupation: truck Geophysicist/field seismologist    Comment: retired  Tobacco Use  . Smoking status: Current Every Day Smoker    Packs/day: 1.00    Years: 54.00    Pack years: 54.00    Types: Cigarettes  . Smokeless tobacco: Never  Used  Vaping Use  . Vaping Use: Never used  Substance and Sexual Activity  . Alcohol use: No    Alcohol/week: 0.0 standard drinks    Comment: rare  . Drug use: No  . Sexual activity: Not Currently  Other Topics Concern  . Not on file  Social History Narrative  . Not on file   Social Determinants of Health   Financial Resource Strain: Low Risk   . Difficulty of Paying Living Expenses: Not hard at all  Food Insecurity: No Food Insecurity  . Worried About Charity fundraiser in the Last Year: Never true  . Ran Out of Food in the Last Year: Never true  Transportation Needs: No Transportation Needs  . Lack of Transportation (Medical): No  . Lack of Transportation (Non-Medical): No  Physical Activity: Inactive  . Days of Exercise per Week: 0 days  . Minutes of Exercise per Session: 0 min  Stress: No Stress Concern Present  . Feeling of Stress : Not at all  Social Connections: Moderately Isolated  . Frequency of Communication with Friends and Family: Three times a week  . Frequency of Social Gatherings with Friends and Family: Three times a week  . Attends Religious Services: Never  . Active Member of Clubs or Organizations: No  . Attends Archivist Meetings: Never  . Marital Status: Married  Human resources officer Violence: Not At Risk  . Fear of Current or Ex-Partner: No  . Emotionally Abused: No  . Physically Abused: No  . Sexually Abused: No    FAMILY HISTORY: Family History  Problem Relation Age of Onset  . Diabetes Mother   . Heart disease Mother   . Heart disease Brother   . Lung cancer Brother   . Emphysema Father   . Lung cancer Brother        x 2  . Lupus Daughter   . Colon cancer Neg Hx   . Esophageal cancer Neg Hx   . Rectal cancer Neg Hx   . Stomach cancer Neg Hx   . Prostate cancer Neg Hx     ALLERGIES:  is allergic to statins.  MEDICATIONS:  Current Outpatient Medications  Medication Sig Dispense Refill  . oxyCODONE-acetaminophen  (PERCOCET) 5-325 MG tablet Take 1 tablet by mouth every 4 (four) hours as needed for moderate pain or severe pain. 30 tablet 0  . aspirin EC 81 MG tablet Take 81 mg by mouth daily. Swallow whole.    . Aspirin-Acetaminophen-Caffeine (GOODY HEADACHE PO) Take 1 packet by mouth daily as needed (pain).    [name] aspirin-sod bicarb-citric acid (ALKA-SELTZER) 325 MG TBEF tablet Take 650 mg by mouth every 6 (six) hours as needed (indigestion).    [name] atorvastatin (LIPITOR) 10 MG tablet Take 10 mg by mouth daily.    [name] glipiZIDE (GLUCOTROL XL)  10 MG 24 hr tablet Take 10 mg by mouth 2 (two) times daily.    . irbesartan (AVAPRO) 300 MG tablet Take 300 mg by mouth daily.    . isosorbide mononitrate (IMDUR) 30 MG 24 hr tablet Take 1 tablet (30 mg total) by mouth at bedtime. 30 tablet 6  . metoprolol tartrate (LOPRESSOR) 100 MG tablet Take 1 tablet (100 mg total) by mouth 2 (two) times daily. 60 tablet 6  . nitroGLYCERIN (NITROSTAT) 0.4 MG SL tablet Place 1 tablet (0.4 mg total) under the tongue every 5 (five) minutes as needed for chest pain. (Patient not taking: Reported on 10/31/2019) 25 tablet 3  . pioglitazone (ACTOS) 30 MG tablet Take 30 mg by mouth daily.    Willeen Niece 100-33 UNT-MCG/ML SOPN Inject 15 Units into the skin daily.  (Patient not taking: Reported on 10/31/2019)     No current facility-administered medications for this visit.    REVIEW OF SYSTEMS:   Review of Systems  Constitutional: Positive for fatigue (severe). Negative for appetite change.  Respiratory: Positive for cough (occasional; stable) and shortness of breath (w/ exertion).   Gastrointestinal: Positive for constipation.  Genitourinary: Positive for dysuria (burning w/ urination) and frequency.   Musculoskeletal: Negative for arthralgias and myalgias.  Neurological: Positive for dizziness and numbness (toes; intermittent).  All other systems reviewed and are negative.     PHYSICAL EXAMINATION: ECOG PERFORMANCE STATUS: 1 - Symptomatic  but completely ambulatory  Vitals:   11/06/19 0814  BP: (!) 158/98  Pulse: 87  Resp: 18  Temp: (!) 97.5 F (36.4 C)  SpO2: 98%   Filed Weights   11/06/19 0814  Weight: 176 lb 12.8 oz (80.2 kg)   Physical Exam Vitals reviewed.  Constitutional:      Appearance: Normal appearance.  Cardiovascular:     Rate and Rhythm: Normal rate and regular rhythm.     Pulses: Normal pulses.     Heart sounds: Normal heart sounds.  Pulmonary:     Effort: Pulmonary effort is normal.     Breath sounds: Normal breath sounds.  Abdominal:     Palpations: Abdomen is soft. There is no hepatomegaly, splenomegaly or mass.     Tenderness: There is no abdominal tenderness.     Hernia: No hernia is present.  Lymphadenopathy:     Upper Body:     Right upper body: No axillary or pectoral adenopathy.     Left upper body: No axillary or pectoral adenopathy.     Lower Body: No right inguinal adenopathy. No left inguinal adenopathy.  Neurological:     General: No focal deficit present.     Mental Status: He is alert and oriented to person, place, and time.  Psychiatric:        Mood and Affect: Mood normal.        Behavior: Behavior normal.      LABORATORY DATA:  I have reviewed the data as listed CBC Latest Ref Rng & Units 09/17/2019 09/16/2019 09/15/2019  WBC 4.0 - 10.5 K/uL - 7.5 7.9  Hemoglobin 13.0 - 17.0 g/dL 13.0 12.1(L) 13.0  Hematocrit 39 - 52 % 40.1 36.7(L) 40.3  Platelets 150 - 400 K/uL - 170 194   CMP Latest Ref Rng & Units 09/17/2019 09/16/2019 09/15/2019  Glucose 70 - 99 mg/dL 276(H) 339(H) 243(H)  BUN 8 - 23 mg/dL 17 35(H) 34(H)  Creatinine 0.61 - 1.24 mg/dL 1.23 1.73(H) 2.00(H)  Sodium 135 - 145 mmol/L 140 135 136  Potassium 3.5 -  5.1 mmol/L 4.3 4.0 4.6  Chloride 98 - 111 mmol/L 105 101 100  CO2 22 - 32 mmol/L $RemoveB'27 26 26  'IHNpFBbY$ Calcium 8.9 - 10.3 mg/dL 8.8(L) 8.7(L) 9.3  Total Protein 6.5 - 8.1 g/dL - 6.1(L) 6.9  Total Bilirubin 0.3 - 1.2 mg/dL - 0.4 0.5  Alkaline Phos 38 - 126 U/L - 83 91   AST 15 - 41 U/L - 15 21  ALT 0 - 44 U/L - 21 25   Surgical pathology ([id number]) on 10/08/2019: Bladder resection: 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  RADIOGRAPHIC STUDIES: I have personally reviewed the radiological images as listed and agreed with the findings in the report. DG C-Arm 1-60 Min-No Report  Result Date: 10/08/2019 Fluoroscopy was utilized by the requesting physician.  No radiographic interpretation.    ASSESSMENT:  1.  High-grade muscle invasive bladder cancer: -Presentation to the ER with TIA symptoms and hematuria. -24 pound weight loss in the last 6 months despite having good appetite and eating well. -CTAP on 09/16/2019 shows mass in the left aspect of the anterior bladder dome measuring 2.7 x 2.3 x 1.8 cm.  No evidence of lymphadenopathy or metastatic disease in the abdomen or pelvis.  Prominent subcentimeter retroperitoneal lymph nodes unchanged from 2007. -Cystoscopy, bilateral retrograde pyelography, transurethral resection of bladder tumor and right JJ ureteral stent placement on 10/08/2019.  Cystoscopy showed 5 cm dome/posterior wall tumor.  1 cm papillary tumor involving the right ureteral orifice. 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  Carcinoma invades muscularis propria. -He met with Dr. Tammi [name] and discussed the option of chemoradiation.  2.  Social/family history: -He is a retired Programmer, systems.  Current active smoker, 1 pack/day for 57 years.  He is seen with his wife who is a retired Therapist, sports at [facility]. -2 [name] had lung cancer and were smokers.  Granddaughter died of neuroblastoma at age 9.   PLAN:  1.  High-grade muscle invasive bladder cancer: -We discussed the scan results and pathology report. -We discussed normal management of muscle invasive bladder cancer with neoadjuvant  chemotherapy followed by cystectomy which is gold standard treatment. -He is reluctant to consider surgery.  However he has an appointment to see Dr. Tresa Moore on 11/22/2019.  I have encouraged him to keep that appointment. -The second best option would be combination chemoradiation therapy.  We will check his creatinine today.  Concurrent chemotherapy options would be cisplatin and 5-FU or 5-FU and mitomycin if he has poor renal function.  Low-dose gemcitabine is also an option. -I have recommended CT scan of the chest and bone scan to complete staging work-up. -I will also make a referral for port placement. -I will see him back after he meets with Dr. Tresa Moore.  2.  Poorly controlled diabetes: -He is currently on Glucotrol XL 10 mg daily and Actos 30 mg daily.  Although it is associated with increased risk of bladder cancer, I see that he is on Actos since June of this year. -He was prescribed Soliqua 15 units daily but he has not started taking it. -Last hemoglobin A1c was 10.6 on 09/15/2019.  3.  Neuropathy: -He does have occasional numbness in the toes when the weather gets colder.  No constant numbness.   All questions were answered. The patient knows to call the clinic with any problems, questions or concerns.  Derek Jack, [name], 11/06/19 9:16 AM  [facility] [phone]  I, Daniel Khashchuk, am acting as a scribe for Dr. Anna Livers Katagadda.  I, Teigen Parslow [name], have reviewed the above documentation for accuracy and completeness, and I agree with the above.     

## 2019-11-08 ENCOUNTER — Ambulatory Visit: Payer: Medicare Other | Admitting: General Surgery

## 2019-11-08 ENCOUNTER — Other Ambulatory Visit: Payer: Self-pay | Admitting: Family Medicine

## 2019-11-08 ENCOUNTER — Encounter (HOSPITAL_COMMUNITY)
Admission: RE | Admit: 2019-11-08 | Discharge: 2019-11-08 | Disposition: A | Discharge status: Home / Self Care | Payer: Medicare Other | Source: Ambulatory Visit | Attending: Hematology | Admitting: Hematology

## 2019-11-08 ENCOUNTER — Other Ambulatory Visit: Payer: Self-pay

## 2019-11-08 ENCOUNTER — Encounter: Payer: Self-pay | Admitting: General Surgery

## 2019-11-08 ENCOUNTER — Encounter (HOSPITAL_COMMUNITY): Payer: Self-pay

## 2019-11-08 VITALS — BP 122/63 | HR 74 | Temp 98.0°F | Resp 16 | Ht 72.0 in | Wt 180.0 lb

## 2019-11-08 DIAGNOSIS — D494 Neoplasm of unspecified behavior of bladder: Secondary | ICD-10-CM

## 2019-11-08 DIAGNOSIS — C679 Malignant neoplasm of bladder, unspecified: Secondary | ICD-10-CM | POA: Diagnosis not present

## 2019-11-08 DIAGNOSIS — M19011 Primary osteoarthritis, right shoulder: Secondary | ICD-10-CM | POA: Diagnosis not present

## 2019-11-08 DIAGNOSIS — M1611 Unilateral primary osteoarthritis, right hip: Secondary | ICD-10-CM | POA: Diagnosis not present

## 2019-11-08 DIAGNOSIS — M1711 Unilateral primary osteoarthritis, right knee: Secondary | ICD-10-CM | POA: Diagnosis not present

## 2019-11-08 MED ORDER — TECHNETIUM TC 99M MEDRONATE IV KIT
20.0000 | PACK | Freq: Once | INTRAVENOUS | Status: AC | PRN
  Administered 2019-11-08: 22 via INTRAVENOUS

## 2019-11-08 NOTE — Patient Instructions (Signed)

## 2019-11-08 NOTE — Addendum Note (Signed)
Encounter addended by: Tyler Pita, MD on: 11/08/2019 3:26 PM  Actions taken: Medication List reviewed, Problem List reviewed, Allergies reviewed

## 2019-11-08 NOTE — Progress Notes (Signed)
Richard [name]; [id number]; 1945/11/15   HPI Patient is a 74 year old white male who was referred to my care by Dr. Delton Coombes for Port-A-Cath placement.  He has bladder cancer and is considering chemotherapy.  He is still in his initial work-up and has not made a decision as to whether to have a port placed or not. Past Medical History:  Diagnosis Date  . Bladder cancer ([facility])   . CAD (coronary artery disease)    a. 2007 - DES x 2 proximal to mid RCA b. 09/2017: DES to mid LAD and OM2. Patent stents along RCA.   . Essential hypertension   . History of atrial flutter    Status post ablation 2008 - Dr. Caryl Comes  . History of transient ischemic attack (TIA)   . Mixed hyperlipidemia    Statin intolerance  . Pneumonia   . Type 2 diabetes mellitus ([facility])    A1C 11.5 06/2017    Past Surgical History:  Procedure Laterality Date  . BALLOON ANGIOPLASTY, ARTERY    . BICEPS TENDON REPAIR Left   . CARDIAC ELECTROPHYSIOLOGY MAPPING AND ABLATION    . CARPAL TUNNEL RELEASE Bilateral   . cervical neck fusion     x 4  . CORONARY ANGIOPLASTY    . CORONARY STENT INTERVENTION N/A 10/17/2017   Procedure: CORONARY STENT INTERVENTION;  Surgeon: Martinique, Peter M, MD;  Location: [facility];  Service: Cardiovascular;  Laterality: N/A;  . CORONARY STENT PLACEMENT     x 2  . CYSTOSCOPY W/ RETROGRADES Bilateral 10/08/2019   Procedure: CYSTOSCOPY WITH BILATERAL RETROGRADE PYELOGRAM;  Surgeon: Cleon Gustin, MD;  Location: [facility];  Service: Urology;  Laterality: Bilateral;  . CYSTOSCOPY WITH URETHRAL DILATATION  10/08/2019   Procedure: CYSTOSCOPY WITH URETHRAL DILATATION;  Surgeon: Cleon Gustin, MD;  Location: [facility];  Service: Urology;;  . LEFT HEART CATH AND CORONARY ANGIOGRAPHY N/A 10/17/2017   Procedure: LEFT HEART CATH AND CORONARY ANGIOGRAPHY;  Surgeon: Martinique, Peter M, MD;  Location: [facility];  Service: Cardiovascular;  Laterality: N/A;  . ROTATOR CUFF REPAIR Right    x 4  . TIBIA  FRACTURE SURGERY Left   . TRANSURETHRAL RESECTION OF BLADDER TUMOR N/A 10/08/2019   Procedure: TRANSURETHRAL RESECTION OF BLADDER TUMOR (TURBT);  Surgeon: Cleon Gustin, MD;  Location: [facility];  Service: Urology;  Laterality: N/A;    Family History  Problem Relation Age of Onset  . Diabetes Mother   . Heart disease Mother   . Heart disease Brother   . Lung cancer Brother   . Emphysema Father   . Lung cancer Brother        x 2  . Lupus Daughter   . Colon cancer Neg Hx   . Esophageal cancer Neg Hx   . Rectal cancer Neg Hx   . Stomach cancer Neg Hx   . Prostate cancer Neg Hx     Current Outpatient Medications on File Prior to Visit  Medication Sig Dispense Refill  . aspirin EC 81 MG tablet Take 81 mg by mouth daily. Swallow whole.    . Aspirin-Acetaminophen-Caffeine (GOODY HEADACHE PO) Take 1 packet by mouth daily as needed (pain).    [name] aspirin-sod bicarb-citric acid (ALKA-SELTZER) 325 MG TBEF tablet Take 650 mg by mouth every 6 (six) hours as needed (indigestion).    [name] atorvastatin (LIPITOR) 10 MG tablet Take 10 mg by mouth daily.    [name] glipiZIDE (GLUCOTROL XL) 10 MG 24 hr tablet Take  10 mg by mouth 2 (two) times daily.    . irbesartan (AVAPRO) 300 MG tablet Take 300 mg by mouth daily.    . isosorbide mononitrate (IMDUR) 30 MG 24 hr tablet Take 1 tablet (30 mg total) by mouth at bedtime. 30 tablet 6  . metoprolol tartrate (LOPRESSOR) 100 MG tablet Take 1 tablet (100 mg total) by mouth 2 (two) times daily. 60 tablet 6  . oxyCODONE-acetaminophen (PERCOCET) 5-325 MG tablet Take 1 tablet by mouth every 4 (four) hours as needed for moderate pain or severe pain. 30 tablet 0  . pioglitazone (ACTOS) 30 MG tablet Take 30 mg by mouth daily.    . nitroGLYCERIN (NITROSTAT) 0.4 MG SL tablet Place 1 tablet (0.4 mg total) under the tongue every 5 (five) minutes as needed for chest pain. (Patient not taking: Reported on 10/31/2019) 25 tablet 3  . SOLIQUA 100-33 UNT-MCG/ML SOPN Inject 15 Units into  the skin daily.  (Patient not taking: Reported on 10/31/2019)     No current facility-administered medications on file prior to visit.    Allergies  Allergen Reactions  . Statins     Leg pain, tolerates lipitor     Social History   Substance and Sexual Activity  Alcohol Use No  . Alcohol/week: 0.0 standard drinks   Comment: rare    Social History   Tobacco Use  Smoking Status Current Every Day Smoker  . Packs/day: 1.00  . Years: 54.00  . Pack years: 54.00  . Types: Cigarettes  Smokeless Tobacco Never Used    Review of Systems  Constitutional: Positive for malaise/fatigue.  HENT: Positive for sinus pain.   Eyes: Positive for blurred vision.  Respiratory: Positive for cough and shortness of breath.   Cardiovascular: Negative.   Gastrointestinal: Positive for heartburn.  Musculoskeletal: Positive for joint pain and neck pain.  Skin: Negative.   Neurological: Positive for dizziness and sensory change.  Endo/Heme/Allergies: Negative.   Psychiatric/Behavioral: Negative.     Objective   Vitals:   11/08/19 1042  BP: 122/63  Pulse: 74  Resp: 16  Temp: 98 F (36.7 C)  SpO2: 95%    Physical Exam Vitals reviewed.  Constitutional:      Appearance: Normal appearance. He is normal weight. He is not ill-appearing.  HENT:     Head: Normocephalic and atraumatic.  Cardiovascular:     Rate and Rhythm: Normal rate and regular rhythm.     Heart sounds: Normal heart sounds. No murmur heard.  No friction rub. No gallop.   Pulmonary:     Effort: Pulmonary effort is normal. No respiratory distress.     Breath sounds: Normal breath sounds. No stridor. No wheezing, rhonchi or rales.  Skin:    General: Skin is warm and dry.  Neurological:     Mental Status: He is alert and oriented to person, place, and time.   Dr. Tomie China notes reviewed  Assessment  Bladder cancer, possible need for central venous access Plan   Patient will call to schedule Port-A-Cath should he  decide to go that route.  The risks and benefits of the procedure including bleeding, infection, and the possibility of a pneumothorax were fully explained to the patient, who gave informed consent.

## 2019-11-09 ENCOUNTER — Ambulatory Visit: Payer: Medicare Other | Admitting: Family Medicine

## 2019-11-09 DIAGNOSIS — I10 Essential (primary) hypertension: Secondary | ICD-10-CM | POA: Diagnosis not present

## 2019-11-09 DIAGNOSIS — E119 Type 2 diabetes mellitus without complications: Secondary | ICD-10-CM | POA: Diagnosis not present

## 2019-11-09 DIAGNOSIS — E785 Hyperlipidemia, unspecified: Secondary | ICD-10-CM | POA: Diagnosis not present

## 2019-11-19 DIAGNOSIS — R600 Localized edema: Secondary | ICD-10-CM | POA: Diagnosis not present

## 2019-11-19 DIAGNOSIS — I10 Essential (primary) hypertension: Secondary | ICD-10-CM | POA: Diagnosis not present

## 2019-11-19 DIAGNOSIS — R0602 Shortness of breath: Secondary | ICD-10-CM | POA: Diagnosis not present

## 2019-11-21 ENCOUNTER — Ambulatory Visit (HOSPITAL_COMMUNITY)
Admission: RE | Admit: 2019-11-21 | Discharge: 2019-11-21 | Disposition: A | Discharge status: Home / Self Care | Payer: Medicare Other | Source: Ambulatory Visit | Attending: Hematology | Admitting: Hematology

## 2019-11-21 ENCOUNTER — Other Ambulatory Visit: Payer: Self-pay

## 2019-11-21 DIAGNOSIS — J439 Emphysema, unspecified: Secondary | ICD-10-CM | POA: Diagnosis not present

## 2019-11-21 DIAGNOSIS — D494 Neoplasm of unspecified behavior of bladder: Secondary | ICD-10-CM | POA: Diagnosis not present

## 2019-11-21 DIAGNOSIS — I2510 Atherosclerotic heart disease of native coronary artery without angina pectoris: Secondary | ICD-10-CM | POA: Diagnosis not present

## 2019-11-21 DIAGNOSIS — I700 Atherosclerosis of aorta: Secondary | ICD-10-CM | POA: Insufficient documentation

## 2019-11-21 DIAGNOSIS — J984 Other disorders of lung: Secondary | ICD-10-CM | POA: Diagnosis not present

## 2019-11-21 DIAGNOSIS — J432 Centrilobular emphysema: Secondary | ICD-10-CM | POA: Diagnosis not present

## 2019-11-21 MED ORDER — IOHEXOL 300 MG/ML  SOLN
75.0000 mL | Freq: Once | INTRAMUSCULAR | Status: AC | PRN
  Administered 2019-11-21: 75 mL via INTRAVENOUS

## 2019-11-22 DIAGNOSIS — C678 Malignant neoplasm of overlapping sites of bladder: Secondary | ICD-10-CM | POA: Diagnosis not present

## 2019-11-22 DIAGNOSIS — N130 Hydronephrosis with ureteropelvic junction obstruction: Secondary | ICD-10-CM | POA: Diagnosis not present

## 2019-11-26 ENCOUNTER — Encounter (HOSPITAL_COMMUNITY): Payer: Self-pay | Admitting: Hematology

## 2019-11-26 ENCOUNTER — Other Ambulatory Visit: Payer: Self-pay

## 2019-11-26 ENCOUNTER — Inpatient Hospital Stay (HOSPITAL_COMMUNITY): Payer: Medicare Other | Attending: Hematology | Admitting: Hematology

## 2019-11-26 VITALS — BP 111/66 | HR 100 | Temp 97.1°F | Resp 17 | Wt 176.9 lb

## 2019-11-26 DIAGNOSIS — F17210 Nicotine dependence, cigarettes, uncomplicated: Secondary | ICD-10-CM | POA: Insufficient documentation

## 2019-11-26 DIAGNOSIS — Z7982 Long term (current) use of aspirin: Secondary | ICD-10-CM | POA: Diagnosis not present

## 2019-11-26 DIAGNOSIS — E1165 Type 2 diabetes mellitus with hyperglycemia: Secondary | ICD-10-CM | POA: Diagnosis not present

## 2019-11-26 DIAGNOSIS — Z8249 Family history of ischemic heart disease and other diseases of the circulatory system: Secondary | ICD-10-CM | POA: Insufficient documentation

## 2019-11-26 DIAGNOSIS — Z8673 Personal history of transient ischemic attack (TIA), and cerebral infarction without residual deficits: Secondary | ICD-10-CM | POA: Diagnosis not present

## 2019-11-26 DIAGNOSIS — C678 Malignant neoplasm of overlapping sites of bladder: Secondary | ICD-10-CM

## 2019-11-26 DIAGNOSIS — I10 Essential (primary) hypertension: Secondary | ICD-10-CM | POA: Diagnosis not present

## 2019-11-26 DIAGNOSIS — I2510 Atherosclerotic heart disease of native coronary artery without angina pectoris: Secondary | ICD-10-CM | POA: Insufficient documentation

## 2019-11-26 DIAGNOSIS — Z79899 Other long term (current) drug therapy: Secondary | ICD-10-CM | POA: Diagnosis not present

## 2019-11-26 DIAGNOSIS — Z801 Family history of malignant neoplasm of trachea, bronchus and lung: Secondary | ICD-10-CM | POA: Insufficient documentation

## 2019-11-26 DIAGNOSIS — Z833 Family history of diabetes mellitus: Secondary | ICD-10-CM | POA: Diagnosis not present

## 2019-11-26 DIAGNOSIS — C679 Malignant neoplasm of bladder, unspecified: Secondary | ICD-10-CM | POA: Diagnosis not present

## 2019-11-26 DIAGNOSIS — E782 Mixed hyperlipidemia: Secondary | ICD-10-CM | POA: Insufficient documentation

## 2019-11-26 DIAGNOSIS — Z7984 Long term (current) use of oral hypoglycemic drugs: Secondary | ICD-10-CM | POA: Insufficient documentation

## 2019-11-26 NOTE — Progress Notes (Signed)
Richard [name], [place] [address]   CLINIC:  Medical Oncology/Hematology  PCP:  Wannetta Sender, FNP [facility] [address] [phone]   REASON FOR VISIT:  Follow-up for bladder cancer  PRIOR THERAPY: TURBT on 10/08/2019  NGS Results: Not done  CURRENT THERAPY: Under work-up  BRIEF ONCOLOGIC HISTORY:  Oncology History   No history exists.    CANCER STAGING: Cancer Staging No matching staging information was found for the patient.  INTERVAL HISTORY:  Richard [name], a 74 y.o. male, returns for routine follow-up of his bladder cancer. Richard [name] was last seen on 11/06/2019.   Today he is accompanied by his wife. He reports feeling okay. He is contemplating about getting treatment with surgery. He continues having productive cough with thick white sputum. He was started on Demadex for his leg swelling, though he continues having swelling and tightness in his legs, especially at night. He also has SOB with exertion. He reports having chest tightness radiating up to his neck when he gets anxious and was prescribed Xanax, but has not started taking it. He is currently taking metformin and glipizide for his diabetes since he stopped taking insulin.  He already saw Dr. Arnoldo Morale about the port placement. He sees [name] and will see him in November. He is unhappy about going to [facility] for radiation and prefers to just go to chemo here, though he is open about going to [place] for radiation.   REVIEW OF SYSTEMS:  Review of Systems  Constitutional: Positive for appetite change (50%) and fatigue (depleted).  Respiratory: Positive for chest tightness (3/10 chest tightness), cough (white sputum) and shortness of breath (w/ exertion).   Cardiovascular: Positive for leg swelling (L leg swelling) and palpitations.  Genitourinary: Positive for dysuria (burning).   Neurological: Positive for numbness  (feet).  Psychiatric/Behavioral: Positive for sleep disturbance. The patient is nervous/anxious.   All other systems reviewed and are negative.   PAST MEDICAL/SURGICAL HISTORY:  Past Medical History:  Diagnosis Date  . Bladder cancer ([place])   . CAD (coronary artery disease)    a. 2007 - DES x 2 proximal to mid RCA b. 09/2017: DES to mid LAD and OM2. Patent stents along RCA.   . Essential hypertension   . History of atrial flutter    Status post ablation 2008 - Dr. Caryl Comes  . History of transient ischemic attack (TIA)   . Mixed hyperlipidemia    Statin intolerance  . Pneumonia   . Type 2 diabetes mellitus (HCC)    A1C 11.5 06/2017   Past Surgical History:  Procedure Laterality Date  . BALLOON ANGIOPLASTY, ARTERY    . BICEPS TENDON REPAIR Left   . CARDIAC ELECTROPHYSIOLOGY MAPPING AND ABLATION    . CARPAL TUNNEL RELEASE Bilateral   . cervical neck fusion     x 4  . CORONARY ANGIOPLASTY    . CORONARY STENT INTERVENTION N/A 10/17/2017   Procedure: CORONARY STENT INTERVENTION;  Surgeon: Martinique, Peter M, MD;  Location: [place] [facility];  Service: Cardiovascular;  Laterality: N/A;  . CORONARY STENT PLACEMENT     x 2  . CYSTOSCOPY W/ RETROGRADES Bilateral 10/08/2019   Procedure: CYSTOSCOPY WITH BILATERAL RETROGRADE PYELOGRAM;  Surgeon: Cleon Gustin, MD;  Location: [facility];  Service: Urology;  Laterality: Bilateral;  . CYSTOSCOPY WITH URETHRAL DILATATION  10/08/2019   Procedure: CYSTOSCOPY WITH URETHRAL DILATATION;  Surgeon: Cleon Gustin, MD;  Location: [facility];  Service: Urology;;  . LEFT HEART CATH AND CORONARY ANGIOGRAPHY N/A 10/17/2017   Procedure: LEFT HEART CATH AND CORONARY ANGIOGRAPHY;  Surgeon: Martinique, Peter M, MD;  Location: [place] [facility];  Service: Cardiovascular;  Laterality: N/A;  . ROTATOR CUFF REPAIR Right    x 4  . TIBIA FRACTURE SURGERY Left   . TRANSURETHRAL RESECTION OF BLADDER TUMOR N/A 10/08/2019   Procedure: TRANSURETHRAL RESECTION OF BLADDER TUMOR  (TURBT);  Surgeon: Cleon Gustin, MD;  Location: [facility];  Service: Urology;  Laterality: N/A;    SOCIAL HISTORY:  Social History   Socioeconomic History  . Marital status: Married    Spouse name: Not on file  . Number of children: 4  . Years of education: Not on file  . Highest education level: Not on file  Occupational History  . Occupation: truck Geophysicist/field seismologist    Comment: retired  Tobacco Use  . Smoking status: Current Every Day Smoker    Packs/day: 1.00    Years: 54.00    Pack years: 54.00    Types: Cigarettes  . Smokeless tobacco: Never Used  Vaping Use  . Vaping Use: Never used  Substance and Sexual Activity  . Alcohol use: No    Alcohol/week: 0.0 standard drinks    Comment: rare  . Drug use: No  . Sexual activity: Not Currently  Other Topics Concern  . Not on file  Social History Narrative  . Not on file   Social Determinants of Health   Financial Resource Strain: Low Risk   . Difficulty of Paying Living Expenses: Not hard at all  Food Insecurity: No Food Insecurity  . Worried About Charity fundraiser in the Last Year: Never true  . Ran Out of Food in the Last Year: Never true  Transportation Needs: No Transportation Needs  . Lack of Transportation (Medical): No  . Lack of Transportation (Non-Medical): No  Physical Activity: Inactive  . Days of Exercise per Week: 0 days  . Minutes of Exercise per Session: 0 min  Stress: No Stress Concern Present  . Feeling of Stress : Not at all  Social Connections: Moderately Isolated  . Frequency of Communication with Friends and Family: Three times a week  . Frequency of Social Gatherings with Friends and Family: Three times a week  . Attends Religious Services: Never  . Active Member of Clubs or Organizations: No  . Attends Archivist Meetings: Never  . Marital Status: Married  Human resources officer Violence: Not At Risk  . Fear of Current or Ex-Partner: No  . Emotionally Abused: No  . Physically Abused: No    . Sexually Abused: No    FAMILY HISTORY:  Family History  Problem Relation Age of Onset  . Diabetes Mother   . Heart disease Mother   . Heart disease Brother   . Lung cancer Brother   . Emphysema Father   . Lung cancer Brother        x 2  . Lupus Daughter   . Colon cancer Neg Hx   . Esophageal cancer Neg Hx   . Rectal cancer Neg Hx   . Stomach cancer Neg Hx   . Prostate cancer Neg Hx     CURRENT MEDICATIONS:  Current Outpatient Medications  Medication Sig Dispense Refill  . metFORMIN (GLUCOPHAGE) 1000 MG tablet Take 1,000 mg by mouth 2 (two) times daily with a meal.    . ALPRAZolam (XANAX) 0.25 MG tablet Take 0.25  mg by mouth 3 (three) times daily.    [name] aspirin EC 81 MG tablet Take 81 mg by mouth daily. Swallow whole.    . Aspirin-Acetaminophen-Caffeine (GOODY HEADACHE PO) Take 1 packet by mouth daily as needed (pain).    [name] aspirin-sod bicarb-citric acid (ALKA-SELTZER) 325 MG TBEF tablet Take 650 mg by mouth every 6 (six) hours as needed (indigestion).    [name] atorvastatin (LIPITOR) 10 MG tablet Take 10 mg by mouth daily.    [name] glipiZIDE (GLUCOTROL XL) 10 MG 24 hr tablet Take 10 mg by mouth 2 (two) times daily.    . irbesartan (AVAPRO) 300 MG tablet Take 300 mg by mouth daily.    . isosorbide mononitrate (IMDUR) 30 MG 24 hr tablet Take 1 tablet (30 mg total) by mouth at bedtime. 30 tablet 6  . metoprolol tartrate (LOPRESSOR) 100 MG tablet Take 1 tablet (100 mg total) by mouth 2 (two) times daily. 60 tablet 6  . nitroGLYCERIN (NITROSTAT) 0.4 MG SL tablet Place 1 tablet (0.4 mg total) under the tongue every 5 (five) minutes as needed for chest pain. (Patient not taking: Reported on 10/31/2019) 25 tablet 3  . oxyCODONE-acetaminophen (PERCOCET) 5-325 MG tablet Take 1 tablet by mouth every 4 (four) hours as needed for moderate pain or severe pain. 30 tablet 0  . torsemide (DEMADEX) 20 MG tablet Take 20 mg by mouth daily.     No current facility-administered medications for this visit.     ALLERGIES:  Allergies  Allergen Reactions  . Statins     Leg pain, tolerates lipitor     PHYSICAL EXAM:  Performance status (ECOG): 1 - Symptomatic but completely ambulatory  Vitals:   11/26/19 1211  BP: 111/66  Pulse: 100  Resp: 17  Temp: (!) 97.1 F (36.2 C)  SpO2: 97%   Wt Readings from Last 3 Encounters:  11/26/19 176 lb 14.4 oz (80.2 kg)  11/08/19 180 lb (81.6 kg)  11/06/19 176 lb 12.8 oz (80.2 kg)   Physical Exam   LABORATORY DATA:  I have reviewed the labs as listed.  CBC Latest Ref Rng & Units 11/06/2019 09/17/2019 09/16/2019  WBC 4.0 - 10.5 K/uL 7.3 - 7.5  Hemoglobin 13.0 - 17.0 g/dL 13.6 13.0 12.1(L)  Hematocrit 39 - 52 % 43.0 40.1 36.7(L)  Platelets 150 - 400 K/uL 217 - 170   CMP Latest Ref Rng & Units 11/06/2019 09/17/2019 09/16/2019  Glucose 70 - 99 mg/dL 269(H) 276(H) 339(H)  BUN 8 - 23 mg/dL 28(H) 17 35(H)  Creatinine 0.61 - 1.24 mg/dL 1.26(H) 1.23 1.73(H)  Sodium 135 - 145 mmol/L 138 140 135  Potassium 3.5 - 5.1 mmol/L 3.9 4.3 4.0  Chloride 98 - 111 mmol/L 99 105 101  CO2 22 - 32 mmol/L 30 27 26   Calcium 8.9 - 10.3 mg/dL 9.5 8.8(L) 8.7(L)  Total Protein 6.5 - 8.1 g/dL 7.2 - 6.1(L)  Total Bilirubin 0.3 - 1.2 mg/dL 0.5 - 0.4  Alkaline Phos 38 - 126 U/L 108 - 83  AST 15 - 41 U/L 17 - 15  ALT 0 - 44 U/L 19 - 21    DIAGNOSTIC IMAGING:  I have independently reviewed the scans and discussed with the patient. CT Chest W Contrast  Result Date: 11/21/2019 CLINICAL DATA:  Recently diagnosed bladder cancer, for staging EXAM: CT CHEST WITH CONTRAST TECHNIQUE: Multidetector CT imaging of the chest was performed during intravenous contrast administration. CONTRAST:  66m OMNIPAQUE IOHEXOL 300 MG/ML  SOLN COMPARISON:  CTA chest  dated 11/25/2005 FINDINGS: Cardiovascular: The heart is normal in size. No pericardial effusion. No evidence of thoracic aortic aneurysm. Atherosclerotic calcifications of the aortic arch. Three vessel coronary atherosclerosis.  Mediastinum/Nodes: Small mediastinal lymph nodes, mildly progressive from 2007, likely reactive: --9 mm short axis prevascular node (series 2/image 64), previously 8 mm --9 mm short axis low right paratracheal node (series 2/image 64), previously 8 mm --12 mm short axis subcarinal node (series 2/image 83), previously 9 mm Visualized thyroid is unremarkable. Lungs/Pleura: Mild biapical pleural-parenchymal scarring. Moderate centrilobular and paraseptal emphysematous changes, upper lung predominant. 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 No solid nodules which are considered suspicious for metastatic disease. No focal consolidation. No pleural effusion or pneumothorax. Upper Abdomen: Visualized upper abdomen is notable for a mildly nodular hepatic contour and vascular calcifications. Musculoskeletal: Degenerative changes of the visualized thoracolumbar spine. Cervical spine fixation hardware, incompletely visualized. IMPRESSION: No findings suspicious for metastatic disease in the chest. Small mediastinal lymph nodes, mildly progressive from 2007, likely reactive. 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 Aortic Atherosclerosis (ICD10-I70.0) and Emphysema (ICD10-J43.9). Electronically Signed   By: Julian Hy M.D.   On: 11/21/2019 13:57   NM Bone Scan Whole Body  Result Date: 11/09/2019 CLINICAL DATA:  Bladder neoplasm, staging EXAM: NUCLEAR MEDICINE WHOLE BODY BONE SCAN TECHNIQUE: Whole body anterior and posterior images were obtained approximately 3 hours after intravenous injection of radiopharmaceutical. RADIOPHARMACEUTICALS:  22 mCi Technetium-32mMDP IV COMPARISON:  None Correlation: CT abdomen and pelvis 09/16/2019 FINDINGS: Uptake at shoulders, sternoclavicular joints, hips, RIGHT knee, typically degenerative. Small  focus of tracer extravasation at LEFT antecubital fossa injection site. No worrisome sites of abnormal tracer accumulation are identified to suggest osseous metastatic disease. Expected urinary tract and soft tissue distribution of tracer. IMPRESSION: No scintigraphic evidence of osseous metastatic disease. Electronically Signed   By: MLavonia DanaM.D.   On: 11/09/2019 08:20     ASSESSMENT:  1.  High-grade muscle invasive bladder cancer: -Presentation to the ER with TIA symptoms and hematuria. -24 pound weight loss in the last 6 months despite having good appetite and eating well. -CTAP on 09/16/2019 shows mass in the left aspect of the anterior bladder dome measuring 2.7 x 2.3 x 1.8 cm.  No evidence of lymphadenopathy or metastatic disease in the abdomen or pelvis.  Prominent subcentimeter retroperitoneal lymph nodes unchanged from 2007. -Cystoscopy, bilateral retrograde pyelography, transurethral resection of bladder tumor and right JJ ureteral stent placement on 10/08/2019.  Cystoscopy showed 5 cm dome/posterior wall tumor.  1 cm papillary tumor involving the right ureteral orifice. 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  Carcinoma invades muscularis propria. -He met with Dr. MTammi Klippeland discussed the option of chemoradiation.  2.  Social/family history: -He is a retired lProgrammer, systems  Current active smoker, 1 pack/day for 57 years.  He is seen with his wife who is a retired RTherapist, [facility]at [facility] -2 brothers had lung cancer and were smokers.  Granddaughter died of neuroblastoma at age 74   PLAN:  1.  High-grade muscle invasive bladder cancer: -He met with Dr. MTresa Mooreto discuss surgical options. -He tells me that he is reluctant to consider surgery as he is doubtful if he can withstand 5 hours of surgery. -We have talked about concurrent chemoradiation therapy.  We have also talked about administration and side  effects. -His creatinine is elevated at  1.26.  Not a candidate for cisplatin.  I have recommended 5-FU and mitomycin.  Gemcitabine low-dose is also an option. -He will require port placement.  He would like to think about whether to consider treatments and let us know in 1 week.  2.  Poorly controlled diabetes: -Continue glipizide and Metformin.  3.  Neuropathy: -He does report numbness occasionally in the toes when his feet are swollen.   Orders placed this encounter:  No orders of the defined types were placed in this encounter.    Derek Jack, MD [place] [phone]   I, Milinda Antis, am acting as a scribe for Dr. Sanda Linger.  I, Derek Jack MD, have reviewed the above documentation for accuracy and completeness, and I agree with the above.

## 2019-11-26 NOTE — Patient Instructions (Signed)
[place] at [facility] Discharge Instructions  You were seen today by Dr. Delton Coombes. He went over your recent results and scans. The most likely chemotherapy course for your cancer will consist of 5-FU and mitomycin. Purchase Mucinex over the counter and take it for your productive cough. You will be referred to radiation in [place]. Dr. Delton Coombes will see you back in 1 week for labs and follow up.   Thank you for choosing [place] at [facility] to provide your oncology and hematology care.  To afford each patient quality time with our provider, please arrive at least 15 minutes before your scheduled appointment time.   If you have a lab appointment with the [facility] please come in thru the Main Entrance and check in at the main information desk  You need to re-schedule your appointment should you arrive 10 or more minutes late.  We strive to give you quality time with our providers, and arriving late affects you and other patients whose appointments are after yours.  Also, if you no show three or more times for appointments you may be dismissed from the clinic at the providers discretion.     Again, thank you for choosing [facility].  Our hope is that these requests will decrease the amount of time that you wait before being seen by our physicians.       _____________________________________________________________  Should you have questions after your visit to [facility], please contact our office at [phone] between the hours of 8:00 a.m. and 4:30 p.m.  Voicemails left after 4:00 p.m. will not be returned until the following business day.  For prescription refill requests, have your pharmacy contact our office and allow 72 hours.    Cancer Center Support Programs:   > Cancer Support Group  2nd Tuesday of the month 1pm-2pm, Journey Room

## 2019-12-03 ENCOUNTER — Ambulatory Visit (HOSPITAL_COMMUNITY): Payer: Medicare Other | Admitting: Hematology

## 2019-12-04 ENCOUNTER — Telehealth: Payer: Self-pay | Admitting: *Deleted

## 2019-12-04 NOTE — Telephone Encounter (Signed)
PATIENT TO HAVE THIS APPT. WITH DR. [place] ON 12-05-19 @ 1:15 PM

## 2019-12-05 ENCOUNTER — Inpatient Hospital Stay (HOSPITAL_COMMUNITY): Payer: Medicare Other | Admitting: Hematology

## 2019-12-05 ENCOUNTER — Other Ambulatory Visit: Payer: Self-pay

## 2019-12-05 VITALS — BP 114/74 | HR 120 | Temp 97.2°F | Resp 18

## 2019-12-05 DIAGNOSIS — F17210 Nicotine dependence, cigarettes, uncomplicated: Secondary | ICD-10-CM | POA: Diagnosis not present

## 2019-12-05 DIAGNOSIS — Z7984 Long term (current) use of oral hypoglycemic drugs: Secondary | ICD-10-CM | POA: Diagnosis not present

## 2019-12-05 DIAGNOSIS — E1165 Type 2 diabetes mellitus with hyperglycemia: Secondary | ICD-10-CM | POA: Diagnosis not present

## 2019-12-05 DIAGNOSIS — C678 Malignant neoplasm of overlapping sites of bladder: Secondary | ICD-10-CM

## 2019-12-05 DIAGNOSIS — I10 Essential (primary) hypertension: Secondary | ICD-10-CM | POA: Diagnosis not present

## 2019-12-05 DIAGNOSIS — Z8673 Personal history of transient ischemic attack (TIA), and cerebral infarction without residual deficits: Secondary | ICD-10-CM | POA: Diagnosis not present

## 2019-12-05 DIAGNOSIS — Z7982 Long term (current) use of aspirin: Secondary | ICD-10-CM | POA: Diagnosis not present

## 2019-12-05 DIAGNOSIS — Z8249 Family history of ischemic heart disease and other diseases of the circulatory system: Secondary | ICD-10-CM | POA: Diagnosis not present

## 2019-12-05 DIAGNOSIS — Z833 Family history of diabetes mellitus: Secondary | ICD-10-CM | POA: Diagnosis not present

## 2019-12-05 DIAGNOSIS — Z79899 Other long term (current) drug therapy: Secondary | ICD-10-CM | POA: Diagnosis not present

## 2019-12-05 DIAGNOSIS — I2510 Atherosclerotic heart disease of native coronary artery without angina pectoris: Secondary | ICD-10-CM | POA: Diagnosis not present

## 2019-12-05 DIAGNOSIS — Z801 Family history of malignant neoplasm of trachea, bronchus and lung: Secondary | ICD-10-CM | POA: Diagnosis not present

## 2019-12-05 DIAGNOSIS — E782 Mixed hyperlipidemia: Secondary | ICD-10-CM | POA: Diagnosis not present

## 2019-12-05 DIAGNOSIS — C679 Malignant neoplasm of bladder, unspecified: Secondary | ICD-10-CM | POA: Diagnosis not present

## 2019-12-05 NOTE — Progress Notes (Signed)
Richard [name], [place] [id number]   CLINIC:  Medical Oncology/Hematology  PCP:  Wannetta Sender, FNP [facility] [address] [phone]   REASON FOR VISIT:  Follow-up for bladder cancer  PRIOR THERAPY: TURBT on 10/08/2019  NGS Results: Not done  CURRENT THERAPY: Under work-up  BRIEF ONCOLOGIC HISTORY:  Oncology History   No history exists.    CANCER STAGING: Cancer Staging No matching staging information was found for the patient.  INTERVAL HISTORY:  Richard [name], a 74 y.o. male, returns for routine follow-up of his bladder cancer. Richard [name] was last seen on 11/26/2019.  Today he is accompanied by his wife. He reports feeling bad. He has still not decided on whether he wants to pursue treatment. He took Demadex for the swelling in his legs.  He will go to see [name] on 11/1 to see if his heart can tolerate the chemo.   REVIEW OF SYSTEMS:  Review of Systems  Constitutional: Positive for appetite change (50%) and fatigue (depleted).  Respiratory: Positive for shortness of breath.   Gastrointestinal: Positive for constipation (occasional).  All other systems reviewed and are negative.   PAST MEDICAL/SURGICAL HISTORY:  Past Medical History:  Diagnosis Date  . Bladder cancer ([place])   . CAD (coronary artery disease)    a. 2007 - DES x 2 proximal to mid RCA b. 09/2017: DES to mid LAD and OM2. Patent stents along RCA.   . Essential hypertension   . History of atrial flutter    Status post ablation 2008 - Dr. Caryl Comes  . History of transient ischemic attack (TIA)   . Mixed hyperlipidemia    Statin intolerance  . Pneumonia   . Type 2 diabetes mellitus (HCC)    A1C 11.5 06/2017   Past Surgical History:  Procedure Laterality Date  . BALLOON ANGIOPLASTY, ARTERY    . BICEPS TENDON REPAIR Left   . CARDIAC ELECTROPHYSIOLOGY MAPPING AND ABLATION    . CARPAL TUNNEL RELEASE Bilateral     . cervical neck fusion     x 4  . CORONARY ANGIOPLASTY    . CORONARY STENT INTERVENTION N/A 10/17/2017   Procedure: CORONARY STENT INTERVENTION;  Surgeon: Martinique, Peter M, MD;  Location: [facility];  Service: Cardiovascular;  Laterality: N/A;  . CORONARY STENT PLACEMENT     x 2  . CYSTOSCOPY W/ RETROGRADES Bilateral 10/08/2019   Procedure: CYSTOSCOPY WITH BILATERAL RETROGRADE PYELOGRAM;  Surgeon: Cleon Gustin, MD;  Location: [facility];  Service: Urology;  Laterality: Bilateral;  . CYSTOSCOPY WITH URETHRAL DILATATION  10/08/2019   Procedure: CYSTOSCOPY WITH URETHRAL DILATATION;  Surgeon: Cleon Gustin, MD;  Location: [facility];  Service: Urology;;  . LEFT HEART CATH AND CORONARY ANGIOGRAPHY N/A 10/17/2017   Procedure: LEFT HEART CATH AND CORONARY ANGIOGRAPHY;  Surgeon: Martinique, Peter M, MD;  Location: [facility];  Service: Cardiovascular;  Laterality: N/A;  . ROTATOR CUFF REPAIR Right    x 4  . TIBIA FRACTURE SURGERY Left   . TRANSURETHRAL RESECTION OF BLADDER TUMOR N/A 10/08/2019   Procedure: TRANSURETHRAL RESECTION OF BLADDER TUMOR (TURBT);  Surgeon: Cleon Gustin, MD;  Location: [facility];  Service: Urology;  Laterality: N/A;    SOCIAL HISTORY:  Social History   Socioeconomic History  . Marital status: Married    Spouse name: Not on file  . Number of children: 4  . Years of  education: Not on file  . Highest education level: Not on file  Occupational History  . Occupation: truck Geophysicist/field seismologist    Comment: retired  Tobacco Use  . Smoking status: Current Every Day Smoker    Packs/day: 1.00    Years: 54.00    Pack years: 54.00    Types: Cigarettes  . Smokeless tobacco: Never Used  Vaping Use  . Vaping Use: Never used  Substance and Sexual Activity  . Alcohol use: No    Alcohol/week: 0.0 standard drinks    Comment: rare  . Drug use: No  . Sexual activity: Not Currently  Other Topics Concern  . Not on file  Social History Narrative  . Not on file   Social  Determinants of Health   Financial Resource Strain: Low Risk   . Difficulty of Paying Living Expenses: Not hard at all  Food Insecurity: No Food Insecurity  . Worried About Charity fundraiser in the Last Year: Never true  . Ran Out of Food in the Last Year: Never true  Transportation Needs: No Transportation Needs  . Lack of Transportation (Medical): No  . Lack of Transportation (Non-Medical): No  Physical Activity: Inactive  . Days of Exercise per Week: 0 days  . Minutes of Exercise per Session: 0 min  Stress: No Stress Concern Present  . Feeling of Stress : Not at all  Social Connections: Moderately Isolated  . Frequency of Communication with Friends and Family: Three times a week  . Frequency of Social Gatherings with Friends and Family: Three times a week  . Attends Religious Services: Never  . Active Member of Clubs or Organizations: No  . Attends Archivist Meetings: Never  . Marital Status: Married  Human resources officer Violence: Not At Risk  . Fear of Current or Ex-Partner: No  . Emotionally Abused: No  . Physically Abused: No  . Sexually Abused: No    FAMILY HISTORY:  Family History  Problem Relation Age of Onset  . Diabetes Mother   . Heart disease Mother   . Heart disease Brother   . Lung cancer Brother   . Emphysema Father   . Lung cancer Brother        x 2  . Lupus Daughter   . Colon cancer Neg Hx   . Esophageal cancer Neg Hx   . Rectal cancer Neg Hx   . Stomach cancer Neg Hx   . Prostate cancer Neg Hx     CURRENT MEDICATIONS:  Current Outpatient Medications  Medication Sig Dispense Refill  . ALPRAZolam (XANAX) 0.25 MG tablet Take 0.25 mg by mouth 3 (three) times daily.    [name] aspirin EC 81 MG tablet Take 81 mg by mouth daily. Swallow whole.    . Aspirin-Acetaminophen-Caffeine (GOODY HEADACHE PO) Take 1 packet by mouth daily as needed (pain).    [name] aspirin-sod bicarb-citric acid (ALKA-SELTZER) 325 MG TBEF tablet Take 650 mg by mouth every 6 (six)  hours as needed (indigestion).    [name] atorvastatin (LIPITOR) 10 MG tablet Take 10 mg by mouth daily.    [name] glipiZIDE (GLUCOTROL XL) 10 MG 24 hr tablet Take 10 mg by mouth 2 (two) times daily.    . irbesartan (AVAPRO) 300 MG tablet Take 300 mg by mouth daily.    . isosorbide mononitrate (IMDUR) 30 MG 24 hr tablet Take 1 tablet (30 mg total) by mouth at bedtime. 30 tablet 6  . metFORMIN (GLUCOPHAGE) 1000 MG tablet Take 1,000 mg by  mouth 2 (two) times daily with a meal.    . metoprolol tartrate (LOPRESSOR) 100 MG tablet Take 1 tablet (100 mg total) by mouth 2 (two) times daily. 60 tablet 6  . nitroGLYCERIN (NITROSTAT) 0.4 MG SL tablet Place 1 tablet (0.4 mg total) under the tongue every 5 (five) minutes as needed for chest pain. 25 tablet 3  . oxyCODONE-acetaminophen (PERCOCET) 5-325 MG tablet Take 1 tablet by mouth every 4 (four) hours as needed for moderate pain or severe pain. 30 tablet 0  . torsemide (DEMADEX) 20 MG tablet Take 20 mg by mouth daily.     No current facility-administered medications for this visit.    ALLERGIES:  Allergies  Allergen Reactions  . Statins     Leg pain, tolerates lipitor     PHYSICAL EXAM:  Performance status (ECOG): 1 - Symptomatic but completely ambulatory  Vitals:   12/05/19 1309  BP: 114/74  Pulse: (!) 120  Resp: 18  Temp: (!) 97.2 F (36.2 C)  SpO2: 100%   Wt Readings from Last 3 Encounters:  11/26/19 176 lb 14.4 oz (80.2 kg)  11/08/19 180 lb (81.6 kg)  11/06/19 176 lb 12.8 oz (80.2 kg)   Physical Exam Vitals reviewed.  Constitutional:      Appearance: Normal appearance.  Neurological:     General: No focal deficit present.     Mental Status: He is alert and oriented to person, place, and time.  Psychiatric:        Mood and Affect: Mood normal.        Behavior: Behavior normal.      LABORATORY DATA:  I have reviewed the labs as listed.  CBC Latest Ref Rng & Units 11/06/2019 09/17/2019 09/16/2019  WBC 4.0 - 10.5 K/uL 7.3 - 7.5    Hemoglobin 13.0 - 17.0 g/dL 13.6 13.0 12.1(L)  Hematocrit 39 - 52 % 43.0 40.1 36.7(L)  Platelets 150 - 400 K/uL 217 - 170   CMP Latest Ref Rng & Units 11/06/2019 09/17/2019 09/16/2019  Glucose 70 - 99 mg/dL 269(H) 276(H) 339(H)  BUN 8 - 23 mg/dL 28(H) 17 35(H)  Creatinine 0.61 - 1.24 mg/dL 1.26(H) 1.23 1.73(H)  Sodium 135 - 145 mmol/L 138 140 135  Potassium 3.5 - 5.1 mmol/L 3.9 4.3 4.0  Chloride 98 - 111 mmol/L 99 105 101  CO2 22 - 32 mmol/L _0 Calcium 8.9 - 10.3 mg/dL 9.5 8.8(L) 8.7(L)  Total Protein 6.5 - 8.1 g/dL 7.2 - 6.1(L)  Total Bilirubin 0.3 - 1.2 mg/dL 0.5 - 0.4  Alkaline Phos 38 - 126 U/L 108 - 83  AST 15 - 41 U/L 17 - 15  ALT 0 - 44 U/L 19 - 21    DIAGNOSTIC IMAGING:  I have independently reviewed the scans and discussed with the patient. CT Chest W Contrast  Result Date: 11/21/2019 CLINICAL DATA:  Recently diagnosed bladder cancer, for staging EXAM: CT CHEST WITH CONTRAST TECHNIQUE: Multidetector CT imaging of the chest was performed during intravenous contrast administration. CONTRAST:  67m OMNIPAQUE IOHEXOL 300 MG/ML  SOLN COMPARISON:  CTA chest dated 11/25/2005 FINDINGS: Cardiovascular: The heart is normal in size. No pericardial effusion. No evidence of thoracic aortic aneurysm. Atherosclerotic calcifications of the aortic arch. Three vessel coronary atherosclerosis. Mediastinum/Nodes: Small mediastinal lymph nodes, mildly progressive from 2007, likely reactive: --9 mm short axis prevascular node (series 2/image 64), previously 8 mm --9 mm short axis low right paratracheal node (series 2/image 64), previously 8 mm --12 mm short  axis subcarinal node (series 2/image 83), previously 9 mm Visualized thyroid is unremarkable. Lungs/Pleura: Mild biapical pleural-parenchymal scarring. Moderate centrilobular and paraseptal emphysematous changes, upper lung predominant. 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 No solid nodules which are considered suspicious for metastatic disease. No focal consolidation. No pleural effusion or pneumothorax. Upper Abdomen: Visualized upper abdomen is notable for a mildly nodular hepatic contour and vascular calcifications. Musculoskeletal: Degenerative changes of the visualized thoracolumbar spine. Cervical spine fixation hardware, incompletely visualized. IMPRESSION: No findings suspicious for metastatic disease in the chest. Small mediastinal lymph nodes, mildly progressive from 2007, likely reactive. 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 Aortic Atherosclerosis (ICD10-I70.0) and Emphysema (ICD10-J43.9). Electronically Signed   By: Julian Hy M.D.   On: 11/21/2019 13:57   NM Bone Scan Whole Body  Result Date: 11/09/2019 CLINICAL DATA:  Bladder neoplasm, staging EXAM: NUCLEAR MEDICINE WHOLE BODY BONE SCAN TECHNIQUE: Whole body anterior and posterior images were obtained approximately 3 hours after intravenous injection of radiopharmaceutical. RADIOPHARMACEUTICALS:  22 mCi Technetium-23mMDP IV COMPARISON:  None Correlation: CT abdomen and pelvis 09/16/2019 FINDINGS: Uptake at shoulders, sternoclavicular joints, hips, RIGHT knee, typically degenerative. Small focus of tracer extravasation at LEFT antecubital fossa injection site. No worrisome sites of abnormal tracer accumulation are identified to suggest osseous metastatic disease. Expected urinary tract and soft tissue distribution of tracer. IMPRESSION: No scintigraphic evidence of osseous metastatic disease. Electronically Signed   By: MLavonia DanaM.D.   On: 11/09/2019 08:20     ASSESSMENT:  1. High-grade muscle invasive bladder cancer: -Presentation to the ER with TIA symptoms and hematuria. -24 pound weight loss in the last 6 months despite having good appetite and eating well. -CTAP on  09/16/2019 shows mass in the left aspect of the anterior bladder dome measuring 2.7 x 2.3 x 1.8 cm.No evidence of lymphadenopathy or metastatic disease in the abdomen or pelvis. Prominent subcentimeter retroperitoneal lymph nodes unchanged from 2007. -Cystoscopy, bilateral retrograde pyelography, transurethral resection of bladder tumor and right JJ ureteral stent placement on 10/08/2019. Cystoscopy showed 5 cm dome/posterior wall tumor. 1 cm papillary tumor involving the right ureteral orifice. 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 Carcinoma invades muscularis propria. -He met with Dr. MTammi Klippeland discussed the option of chemoradiation. -He also met with Dr. MTresa Mooreand discussed surgical options.  He is reluctant to consider surgery. -Bone scan on 11/08/2019 was negative for meta stasis. -CT chest on 11/21/2019 shows no suspicious findings for metastatic disease.  Small mediastinal lymph nodes likely reactive.  2. Social/family history: -He is a retired lProgrammer, systems Current active smoker, 1 pack/day for 57 years. He is seen with his wife who is a retired RTherapist, [facility]at [employer] [employer] -2 brothers had lung cancer and were smokers. Granddaughter died of neuroblastoma at age 74   PLAN:  1. High-grade muscle invasive bladder cancer: -At last visit we talked about concurrent chemoradiation therapy. -We also talked about results of bone scan and a CT scan of the chest. -I have elaborated on the chemotherapy regimen with 5-FU and mitomycin which has shown to improve locoregional control of bladder cancer compared with radiation therapy alone. -Because of his suboptimal renal function, not a candidate for cisplatin based therapy. -We talked about 5-FU administered as a continuous infusion (500 mg/m2/day) during fractions 1 through 5 and 16 through 20 of radiation, 10 days in total.  Mitomycin  will be given as an intravenous  bolus dose 12 mg/m2 on day 1. -We briefly discussed the side effects including diarrhea, nausea, vomiting, tiredness, cytopenias among others. -He will see [name] on 12/24/2019.  Patient would like to think about proceeding with combination chemoradiation therapy once he is cleared by cardiology. -I plan to see him back in the first week of November.  2. Poorly controlled diabetes: -Continue glipizide and Metformin.  3. Neuropathy: -Occasional numbness in the toes when his feet are swollen.   Orders placed this encounter:  No orders of the defined types were placed in this encounter.    Derek Jack, MD [place] [phone]   I, Milinda Antis, am acting as a scribe for Dr. Sanda Linger.  I, Derek Jack MD, have reviewed the above documentation for accuracy and completeness, and I agree with the above.

## 2019-12-05 NOTE — Patient Instructions (Signed)
[facility] at [facility] Discharge Instructions  You were seen today by Dr. Delton Coombes. He went over your recent results. Please keep your appointment with [name] on 11/1. Dr. Delton Coombes will see you back in 3 weeks for follow up.   Thank you for choosing [place] at [facility] to provide your oncology and hematology care.  To afford each patient quality time with our provider, please arrive at least 15 minutes before your scheduled appointment time.   If you have a lab appointment with the [facility] please come in thru the Main Entrance and check in at the main information desk  You need to re-schedule your appointment should you arrive 10 or more minutes late.  We strive to give you quality time with our providers, and arriving late affects you and other patients whose appointments are after yours.  Also, if you no show three or more times for appointments you may be dismissed from the clinic at the providers discretion.     Again, thank you for choosing [facility].  Our hope is that these requests will decrease the amount of time that you wait before being seen by our physicians.       _____________________________________________________________  Should you have questions after your visit to [facility], please contact our office at [phone] between the hours of 8:00 a.m. and 4:30 p.m.  Voicemails left after 4:00 p.m. will not be returned until the following business day.  For prescription refill requests, have your pharmacy contact our office and allow 72 hours.    Cancer Center Support Programs:   > Cancer Support Group  2nd Tuesday of the month 1pm-2pm, Journey Room

## 2019-12-24 ENCOUNTER — Ambulatory Visit (INDEPENDENT_AMBULATORY_CARE_PROVIDER_SITE_OTHER): Payer: Medicare Other | Admitting: Cardiology

## 2019-12-24 ENCOUNTER — Encounter: Payer: Self-pay | Admitting: Cardiology

## 2019-12-24 ENCOUNTER — Telehealth: Payer: Self-pay | Admitting: Cardiology

## 2019-12-24 VITALS — BP 162/94 | HR 108 | Ht 72.0 in | Wt 180.4 lb

## 2019-12-24 DIAGNOSIS — I25119 Atherosclerotic heart disease of native coronary artery with unspecified angina pectoris: Secondary | ICD-10-CM

## 2019-12-24 DIAGNOSIS — D649 Anemia, unspecified: Secondary | ICD-10-CM

## 2019-12-24 DIAGNOSIS — E782 Mixed hyperlipidemia: Secondary | ICD-10-CM | POA: Diagnosis not present

## 2019-12-24 HISTORY — DX: Anemia, unspecified: D64.9

## 2019-12-24 MED ORDER — METOPROLOL TARTRATE 50 MG PO TABS: 50.0000 mg | ORAL_TABLET | Freq: Every day | ORAL | 3 refills | Status: DC

## 2019-12-24 MED ORDER — METOPROLOL TARTRATE 100 MG PO TABS: 100.0000 mg | ORAL_TABLET | Freq: Two times a day (BID) | ORAL | 1 refills | Status: DC

## 2019-12-24 MED ORDER — AMLODIPINE BESYLATE 2.5 MG PO TABS
2.5000 mg | ORAL_TABLET | Freq: Every day | ORAL | 3 refills | Status: DC
Start: 2019-12-24 — End: 2020-01-01

## 2019-12-24 NOTE — Addendum Note (Signed)
Addended by: Julian Hy T on: 12/24/2019 12:03 PM   Modules accepted: Orders

## 2019-12-24 NOTE — Telephone Encounter (Signed)
Pre-cert Verification for the following procedure    ECHO   DATE:   12/27/2019  LOCATION: [facility]  

## 2019-12-24 NOTE — Patient Instructions (Signed)
Your physician recommends that you schedule a follow-up appointment in: [facility] has recommended you make the following change in your medication:   INCREASE LOPRESSOR (MEOTPROLOL) 50 MG AT LUNCHTIME ALONG WITH LOPRESSOR 100 MG IN THE MORNING AND EVENING   START AMLODIPINE 2.5 MG DAILY   Your physician has requested that you have an echocardiogram. Echocardiography is a painless test that uses sound waves to create images of your heart. It provides your doctor with information about the size and shape of your heart and how well your heart's chambers and valves are working. This procedure takes approximately one hour. There are no restrictions for this procedure.   Thank you for choosing [facility]!!

## 2019-12-24 NOTE — Progress Notes (Signed)
Cardiology Office Note  Date: 12/24/2019   ID: Richard [name], DOB 12-22-1945, MRN [id number]  PCP:  Wannetta Sender, FNP  Cardiologist:  Rozann Lesches, MD Electrophysiologist:  None   Chief Complaint  Patient presents with  . Cardiac follow-up    History of Present Illness: Richard [name] is a 74 y.o. male last seen in August by Mr. Leonides Sake NP.  He presents for a follow-up visit.  From a cardiac perspective, he describes occasional angina symptoms, feeling of burning in his upper chest and to his mandible.  Not always with exertion however.  He also reports trouble with reflux and a hiatal hernia.  He is following in the oncology clinic for management of bladder cancer, status post TURBT in August.  He has a high-grade muscle invasive tumor and is considering chemoradiation options with Dr. Delton Coombes, I reviewed the note from October 13.  He tells me that he has not made a decision, he may ultimately elect no treatment at all.  5-FU does have potential cardiac toxicity which would need to be considered, we discussed getting a follow-up echocardiogram to reevaluate LVEF, previously normal as of 2019.  I personally reviewed his ECG today which shows sinus tachycardia with PACs, nonspecific ST-T changes.  He reports compliance with his medications including high-dose beta-blocker as outlined below.  His blood pressure is not optimally controlled today.  Past Medical History:  Diagnosis Date  . Bladder cancer ([place])   . CAD (coronary artery disease)    a. 2007 - DES x 2 proximal to mid RCA b. 09/2017: DES to mid LAD and OM2. Patent stents along RCA.   . Essential hypertension   . History of atrial flutter    Status post ablation 2008 - Dr. Caryl Comes  . History of transient ischemic attack (TIA)   . Mixed hyperlipidemia    Statin intolerance  . Pneumonia   . Type 2 diabetes mellitus (HCC)    A1C 11.5 06/2017    Past Surgical History:  Procedure Laterality Date  . BALLOON  ANGIOPLASTY, ARTERY    . BICEPS TENDON REPAIR Left   . CARDIAC ELECTROPHYSIOLOGY MAPPING AND ABLATION    . CARPAL TUNNEL RELEASE Bilateral   . cervical neck fusion     x 4  . CORONARY ANGIOPLASTY    . CORONARY STENT INTERVENTION N/A 10/17/2017   Procedure: CORONARY STENT INTERVENTION;  Surgeon: Martinique, Peter M, MD;  Location: [facility];  Service: Cardiovascular;  Laterality: N/A;  . CORONARY STENT PLACEMENT     x 2  . CYSTOSCOPY W/ RETROGRADES Bilateral 10/08/2019   Procedure: CYSTOSCOPY WITH BILATERAL RETROGRADE PYELOGRAM;  Surgeon: Cleon Gustin, MD;  Location: [facility];  Service: Urology;  Laterality: Bilateral;  . CYSTOSCOPY WITH URETHRAL DILATATION  10/08/2019   Procedure: CYSTOSCOPY WITH URETHRAL DILATATION;  Surgeon: Cleon Gustin, MD;  Location: [facility];  Service: Urology;;  . LEFT HEART CATH AND CORONARY ANGIOGRAPHY N/A 10/17/2017   Procedure: LEFT HEART CATH AND CORONARY ANGIOGRAPHY;  Surgeon: Martinique, Peter M, MD;  Location: [facility];  Service: Cardiovascular;  Laterality: N/A;  . ROTATOR CUFF REPAIR Right    x 4  . TIBIA FRACTURE SURGERY Left   . TRANSURETHRAL RESECTION OF BLADDER TUMOR N/A 10/08/2019   Procedure: TRANSURETHRAL RESECTION OF BLADDER TUMOR (TURBT);  Surgeon: Cleon Gustin, MD;  Location: [facility];  Service: Urology;  Laterality: N/A;    Current Outpatient Medications  Medication Sig Dispense Refill  .  aspirin EC 81 MG tablet Take 325 mg by mouth daily as needed for mild pain or moderate pain. Swallow whole.     . Aspirin-Acetaminophen-Caffeine (GOODY HEADACHE PO) Take 1 packet by mouth daily as needed (pain).    [name] aspirin-sod bicarb-citric acid (ALKA-SELTZER) 325 MG TBEF tablet Take 650 mg by mouth every 6 (six) hours as needed (indigestion).    [name] atorvastatin (LIPITOR) 10 MG tablet Take 10 mg by mouth daily.    [name] glipiZIDE (GLUCOTROL XL) 10 MG 24 hr tablet Take 10 mg by mouth 2 (two) times daily.    . irbesartan (AVAPRO) 300 MG  tablet Take 300 mg by mouth daily.    . isosorbide mononitrate (IMDUR) 30 MG 24 hr tablet Take 1 tablet (30 mg total) by mouth at bedtime. 30 tablet 6  . metFORMIN (GLUCOPHAGE) 1000 MG tablet Take 1,000 mg by mouth 2 (two) times daily with a meal.    . metoprolol tartrate (LOPRESSOR) 100 MG tablet Take 1 tablet (100 mg total) by mouth 2 (two) times daily. 60 tablet 6  . nitroGLYCERIN (NITROSTAT) 0.4 MG SL tablet Place 1 tablet (0.4 mg total) under the tongue every 5 (five) minutes as needed for chest pain. 25 tablet 3  . oxyCODONE-acetaminophen (PERCOCET) 5-325 MG tablet Take 1 tablet by mouth every 4 (four) hours as needed for moderate pain or severe pain. 30 tablet 0  . torsemide (DEMADEX) 20 MG tablet Take 20 mg by mouth daily as needed (swelling).     [name] amLODipine (NORVASC) 2.5 MG tablet Take 1 tablet (2.5 mg total) by mouth daily. 30 tablet 3  . metoprolol tartrate (LOPRESSOR) 50 MG tablet Take 1 tablet (50 mg total) by mouth daily. 30 tablet 3   No current facility-administered medications for this visit.   Allergies:  Statins   ROS: No sense of palpitations, no syncope.  Physical Exam: VS:  BP (!) 162/94   Pulse (!) 108   Ht 6' (1.829 m)   Wt 180 lb 6.4 oz (81.8 kg)   SpO2 95%   BMI 24.47 kg/m , BMI Body mass index is 24.47 kg/m.  Wt Readings from Last 3 Encounters:  12/24/19 180 lb 6.4 oz (81.8 kg)  11/26/19 176 lb 14.4 oz (80.2 kg)  11/08/19 180 lb (81.6 kg)    General: Patient appears comfortable at rest. HEENT: Conjunctiva and lids normal, wearing a mask. Neck: Supple, no elevated JVP or carotid bruits, no thyromegaly. Lungs: Clear to auscultation, nonlabored breathing at rest. Cardiac: Regular rate and rhythm with occasional ectopy, no S3, soft systolic murmur, no pericardial rub. Abdomen: Soft, nontender, bowel sounds present. Extremities: No pitting edema, distal pulses 2+.  ECG:  An ECG dated 09/15/2019 was personally reviewed today and demonstrated:  Sinus  rhythm.  Recent Labwork: 09/17/2019: Magnesium 1.5 11/06/2019: ALT 19; AST 17; BUN 28; Creatinine, Ser 1.26; Hemoglobin 13.6; Platelets 217; Potassium 3.9; Sodium 138     Component Value Date/Time   CHOL 157 09/16/2019 0614   TRIG 121 09/16/2019 0614   HDL 39 (L) 09/16/2019 0614   CHOLHDL 4.0 09/16/2019 0614   VLDL 24 09/16/2019 0614   LDLCALC 94 09/16/2019 0614    Other Studies Reviewed Today:  Cardiac catheterization and PCI 10/17/2017:  Prox RCA lesion is 15% stenosed.  Mid RCA lesion is 30% stenosed.  Mid LM lesion is 25% stenosed.  Mid LAD lesion is 85% stenosed.  A drug-eluting stent was successfully placed using a STENT SYNERGY DES 2.75X16.  Post  intervention, there is a 0% residual stenosis.  Ost 2nd Mrg lesion is 50% stenosed.  Mid Cx lesion is 45% stenosed.  3rd Mrg lesion is 85% stenosed.  A drug-eluting stent was successfully placed using a STENT SYNERGY DES 3X24.  Post intervention, there is a 0% residual stenosis.  LV end diastolic pressure is normal.  1. Severe 2 vessel obstructive CAD- patient has diffuse CAD - 85% mid LAD - 85% large OM2 2. Patent stents in RCA.  3. Normal LVEDP 4. Successful PCI of the mid LAD with DES 5. Successful PCI of the OM2  Plan: aggressive risk factor modification. Need to stress compliance with medical therapy. DAPT for one year. Since he has no documented atrial arrhythmia since his ablation I would not resume DOAC. Anticipate DC in am  Recommend uninterrupted dual antiplatelet therapy with Aspirin 81mg  daily and Ticagrelor 90mg  twice dailyfor a minimum of 12 months (ACS - Class I recommendation).  Echocardiogram 06/28/2017: Study Conclusions  - Left ventricle: The cavity size was normal. Wall thickness was increased in a pattern of moderate LVH. Systolic function was normal. The estimated ejection fraction was in the range of 55% to 60%. Wall motion was normal; there were no regional  wall motion abnormalities. Doppler parameters are consistent with abnormal left ventricular relaxation (grade 1 diastolic dysfunction).  Impressions:  - Normal LV systolic function; mild diastolic dysfunction; moderate LVH.  Assessment and Plan:  1.  CAD status post DES x2 to the RCA in 2007 as well as DES to the mid LAD and second obtuse marginal in 2019.  He does report intermittent angina symptoms versus reflux.  We will continue medical therapy with up titration of beta-blocker (additional 50 mg in the middle of the day) and also low-dose Norvasc 2.5 mg daily.  He will continue aspirin, Lipitor, Avapro, and Imdur otherwise.  We will obtain an echocardiogram to reevaluate LVEF, previously 55 to 60% in 2019.  2.  High-grade muscle invasive bladder cancer.  He is being considered for chemoradiation per Dr. Delton Coombes, possible use of 5-FU which does have potential cardiotoxicity.  Patient has not yet decided whether he will pursue any treatment as yet.  Baseline LVEF will be obtained as noted above.    3.  Mild nonobstructive carotid artery disease, Dopplers noted in May.  He is asymptomatic and continues on aspirin as well as Lipitor.  Medication Adjustments/Labs and Tests Ordered: Current medicines are reviewed at length with the patient today.  Concerns regarding medicines are outlined above.   Tests Ordered: Orders Placed This Encounter  Procedures  . EKG 12-Lead  . ECHOCARDIOGRAM COMPLETE    Medication Changes: Meds ordered this encounter  Medications  . metoprolol tartrate (LOPRESSOR) 50 MG tablet    Sig: Take 1 tablet (50 mg total) by mouth daily.    Dispense:  30 tablet    Refill:  3    NEW IN ADDITION TO LOPRESSOR 100 MG TWICE DAILY  . amLODipine (NORVASC) 2.5 MG tablet    Sig: Take 1 tablet (2.5 mg total) by mouth daily.    Dispense:  30 tablet    Refill:  3    NEW 12/24/2019    Disposition:  Follow up 6 weeks in the [place] office.  Signed, Satira Sark, MD, [facility] 12/24/2019 11:59 AM    [facility] [place], [place], [place] [address] Phone: [phone]; Fax: [phone]

## 2019-12-26 ENCOUNTER — Ambulatory Visit (HOSPITAL_COMMUNITY): Payer: Medicare Other | Admitting: Hematology

## 2019-12-26 ENCOUNTER — Ambulatory Visit (INDEPENDENT_AMBULATORY_CARE_PROVIDER_SITE_OTHER): Payer: Medicare Other

## 2019-12-26 DIAGNOSIS — I25119 Atherosclerotic heart disease of native coronary artery with unspecified angina pectoris: Secondary | ICD-10-CM

## 2019-12-26 LAB — ECHOCARDIOGRAM COMPLETE
Area-P 1/2: 4.17 cm2
Calc EF: 30.3 %
MV M vel: 4.79 m/s
MV Peak grad: 91.9 mmHg
S' Lateral: 4.76 cm
Single Plane A2C EF: 32.2 %
Single Plane A4C EF: 30.1 %

## 2019-12-27 ENCOUNTER — Telehealth: Payer: Self-pay

## 2019-12-28 ENCOUNTER — Other Ambulatory Visit: Payer: Self-pay | Admitting: Urology

## 2019-12-28 ENCOUNTER — Telehealth: Payer: Self-pay | Admitting: *Deleted

## 2019-12-28 DIAGNOSIS — I10 Essential (primary) hypertension: Secondary | ICD-10-CM

## 2019-12-28 DIAGNOSIS — Z79899 Other long term (current) drug therapy: Secondary | ICD-10-CM

## 2019-12-28 MED ORDER — OXYCODONE-ACETAMINOPHEN 5-325 MG PO TABS: 1.0000 | ORAL_TABLET | ORAL | 0 refills | Status: DC | PRN

## 2019-12-28 NOTE — Telephone Encounter (Signed)
-----   Message from Satira Sark, MD sent at 12/26/2019  4:06 PM EDT ----- Results reviewed.  Unfortunately follow-up echocardiogram shows significant reduction in LVEF, now 25 to 30% range.  This finding would be of significant concern if he were to plan a chemotherapy regimen with 5-FU.  There would be significant risk of worsening function with cardiotoxicity.  He was not yet certain that he would pursue any treatment however based on our recent discussion.  Either way, we should make medication adjustments to try and improve the situation.  Not clear that he is a good candidate for cardiac catheterization in light of his invasive bladder tumor and risk for recurring hematuria if he were to go on dual antiplatelet therapy.  I would suggest that we stop Lopressor and replace it with Toprol-XL 75 mg twice daily.  Would also discontinue Avapro and replace with Entresto 49/51 mg twice daily.  We just recently started Norvasc, but would have him hold it while we make these other changes first.  Check BMET in the next 7 to 10 days after making medication adjustments.  Please move up his follow-up visit with [facility] in [place] in the next 3 to 4 weeks.

## 2019-12-28 NOTE — Telephone Encounter (Signed)
RX for oxycodone sent. Pain and discomfort is from the stent. Please give mirabegron 25mg  daily

## 2019-12-28 NOTE — Telephone Encounter (Signed)
Attempted to call pt at both numbers. Unable to leave message at either.

## 2020-01-01 ENCOUNTER — Other Ambulatory Visit: Payer: Self-pay | Admitting: *Deleted

## 2020-01-01 ENCOUNTER — Inpatient Hospital Stay (HOSPITAL_COMMUNITY): Payer: Medicare Other | Attending: Hematology | Admitting: Hematology

## 2020-01-01 ENCOUNTER — Other Ambulatory Visit: Payer: Self-pay

## 2020-01-01 VITALS — BP 163/88 | HR 87 | Temp 98.1°F | Resp 18 | Wt 176.9 lb

## 2020-01-01 DIAGNOSIS — Z7984 Long term (current) use of oral hypoglycemic drugs: Secondary | ICD-10-CM | POA: Insufficient documentation

## 2020-01-01 DIAGNOSIS — E1140 Type 2 diabetes mellitus with diabetic neuropathy, unspecified: Secondary | ICD-10-CM | POA: Insufficient documentation

## 2020-01-01 DIAGNOSIS — C678 Malignant neoplasm of overlapping sites of bladder: Secondary | ICD-10-CM

## 2020-01-01 DIAGNOSIS — F17210 Nicotine dependence, cigarettes, uncomplicated: Secondary | ICD-10-CM | POA: Insufficient documentation

## 2020-01-01 DIAGNOSIS — C679 Malignant neoplasm of bladder, unspecified: Secondary | ICD-10-CM | POA: Diagnosis not present

## 2020-01-01 DIAGNOSIS — Z79899 Other long term (current) drug therapy: Secondary | ICD-10-CM

## 2020-01-01 DIAGNOSIS — E1165 Type 2 diabetes mellitus with hyperglycemia: Secondary | ICD-10-CM | POA: Diagnosis not present

## 2020-01-01 DIAGNOSIS — I10 Essential (primary) hypertension: Secondary | ICD-10-CM

## 2020-01-01 MED ORDER — METOPROLOL SUCCINATE ER 50 MG PO TB24
75.0000 mg | ORAL_TABLET | Freq: Two times a day (BID) | ORAL | 3 refills | Status: DC
Start: 2020-01-01 — End: 2020-01-29

## 2020-01-01 MED ORDER — ENTRESTO 49-51 MG PO TABS: 1.0000 | ORAL_TABLET | Freq: Two times a day (BID) | ORAL | 3 refills | Status: DC

## 2020-01-01 NOTE — Progress Notes (Signed)
Richard [name], [place] [address]   CLINIC:  Medical Oncology/Hematology  PCP:  Wannetta Sender, FNP [name] [address] [phone]   REASON FOR VISIT:  Follow-up for bladder cancer  PRIOR THERAPY: TURBT on 10/08/2019  NGS Results: Not done  CURRENT THERAPY: Under work-up  BRIEF ONCOLOGIC HISTORY:  Oncology History   No history exists.    CANCER STAGING: Cancer Staging No matching staging information was found for the patient.  INTERVAL HISTORY:  Richard [name], a 74 y.o. male, returns for routine follow-up of his bladder cancer. Richard [name] was last seen on 12/05/2019.   Today Richard [name] is accompanied by his wife and Richard [name] reports feeling okay. His urine is now dark instead of red with every micturition. Richard [name] reports getting jaw pain when Richard [name] has cardiac issues and can come up regardless of activity or rest and lasting from a few minutes to 15 minutes; Richard [name] also gets burning in his chest as an associated symptom. Richard [name] also complains of having unprovoked pain beginning at the bottom of his mid-back and behind his pelvis. Richard [name] denies having pain at the moment. Richard [name] reports having orthopnea and has to lie on his side or sleep in a recliner. His appetite is excellent. Richard [name] takes Demadex as needed for his leg swelling.  Richard [name] saw [name] on 11/1 and will see him on 12/1.   REVIEW OF SYSTEMS:  Review of Systems  Constitutional: Positive for fatigue (30%). Negative for appetite change.  HENT:   Positive for trouble swallowing (w/ pills).   Respiratory: Positive for cough (productive) and shortness of breath (orthopnea).   Cardiovascular: Negative for leg swelling.  Gastrointestinal: Positive for abdominal pain (4/10 lower abdominal pain).  Genitourinary: Positive for difficulty urinating (dark urine).   Musculoskeletal: Positive for back pain (4/10 lower back pain).  All other systems reviewed and are  negative.   PAST MEDICAL/SURGICAL HISTORY:  Past Medical History:  Diagnosis Date  . Bladder cancer ([place])   . CAD (coronary artery disease)    a. 2007 - DES x 2 proximal to mid RCA b. 09/2017: DES to mid LAD and OM2. Patent stents along RCA.   . Essential hypertension   . History of atrial flutter    Status post ablation 2008 - Dr. Caryl Comes  . History of transient ischemic attack (TIA)   . Mixed hyperlipidemia    Statin intolerance  . Pneumonia   . Type 2 diabetes mellitus (HCC)    A1C 11.5 06/2017   Past Surgical History:  Procedure Laterality Date  . BALLOON ANGIOPLASTY, ARTERY    . BICEPS TENDON REPAIR Left   . CARDIAC ELECTROPHYSIOLOGY MAPPING AND ABLATION    . CARPAL TUNNEL RELEASE Bilateral   . cervical neck fusion     x 4  . CORONARY ANGIOPLASTY    . CORONARY STENT INTERVENTION N/A 10/17/2017   Procedure: CORONARY STENT INTERVENTION;  Surgeon: Martinique, Peter M, MD;  Location: [facility];  Service: Cardiovascular;  Laterality: N/A;  . CORONARY STENT PLACEMENT     x 2  . CYSTOSCOPY W/ RETROGRADES Bilateral 10/08/2019   Procedure: CYSTOSCOPY WITH BILATERAL RETROGRADE PYELOGRAM;  Surgeon: Cleon Gustin, MD;  Location: [facility];  Service: Urology;  Laterality: Bilateral;  . CYSTOSCOPY WITH URETHRAL DILATATION  10/08/2019   Procedure: CYSTOSCOPY WITH URETHRAL DILATATION;  Surgeon: Cleon Gustin, MD;  Location: [facility];  Service: Urology;;  .  LEFT HEART CATH AND CORONARY ANGIOGRAPHY N/A 10/17/2017   Procedure: LEFT HEART CATH AND CORONARY ANGIOGRAPHY;  Surgeon: Martinique, Peter M, MD;  Location: [facility];  Service: Cardiovascular;  Laterality: N/A;  . ROTATOR CUFF REPAIR Right    x 4  . TIBIA FRACTURE SURGERY Left   . TRANSURETHRAL RESECTION OF BLADDER TUMOR N/A 10/08/2019   Procedure: TRANSURETHRAL RESECTION OF BLADDER TUMOR (TURBT);  Surgeon: Cleon Gustin, MD;  Location: [facility];  Service: Urology;  Laterality: N/A;    SOCIAL HISTORY:  Social  History   Socioeconomic History  . Marital status: Married    Spouse name: Not on file  . Number of children: 4  . Years of education: Not on file  . Highest education level: Not on file  Occupational History  . Occupation: truck Geophysicist/field seismologist    Comment: retired  Tobacco Use  . Smoking status: Current Every Day Smoker    Packs/day: 1.00    Years: 54.00    Pack years: 54.00    Types: Cigarettes  . Smokeless tobacco: Never Used  Vaping Use  . Vaping Use: Never used  Substance and Sexual Activity  . Alcohol use: No    Alcohol/week: 0.0 standard drinks    Comment: rare  . Drug use: No  . Sexual activity: Not Currently  Other Topics Concern  . Not on file  Social History Narrative  . Not on file   Social Determinants of Health   Financial Resource Strain: Low Risk   . Difficulty of Paying Living Expenses: Not hard at all  Food Insecurity: No Food Insecurity  . Worried About Charity fundraiser in the Last Year: Never true  . Ran Out of Food in the Last Year: Never true  Transportation Needs: No Transportation Needs  . Lack of Transportation (Medical): No  . Lack of Transportation (Non-Medical): No  Physical Activity: Inactive  . Days of Exercise per Week: 0 days  . Minutes of Exercise per Session: 0 min  Stress: No Stress Concern Present  . Feeling of Stress : Not at all  Social Connections: Moderately Isolated  . Frequency of Communication with Friends and Family: Three times a week  . Frequency of Social Gatherings with Friends and Family: Three times a week  . Attends Religious Services: Never  . Active Member of Clubs or Organizations: No  . Attends Archivist Meetings: Never  . Marital Status: Married  Human resources officer Violence: Not At Risk  . Fear of Current or Ex-Partner: No  . Emotionally Abused: No  . Physically Abused: No  . Sexually Abused: No    FAMILY HISTORY:  Family History  Problem Relation Age of Onset  . Diabetes Mother   . Heart  disease Mother   . Heart disease Brother   . Lung cancer Brother   . Emphysema Father   . Lung cancer Brother        x 2  . Lupus Daughter   . Colon cancer Neg Hx   . Esophageal cancer Neg Hx   . Rectal cancer Neg Hx   . Stomach cancer Neg Hx   . Prostate cancer Neg Hx     CURRENT MEDICATIONS:  Current Outpatient Medications  Medication Sig Dispense Refill  . aspirin EC 81 MG tablet Take 325 mg by mouth daily as needed for mild pain or moderate pain. Swallow whole.     . Aspirin-Acetaminophen-Caffeine (GOODY HEADACHE PO) Take 1 packet by mouth daily as  needed (pain).    [name] aspirin-sod bicarb-citric acid (ALKA-SELTZER) 325 MG TBEF tablet Take 650 mg by mouth every 6 (six) hours as needed (indigestion).    [name] atorvastatin (LIPITOR) 10 MG tablet Take 10 mg by mouth daily.    . B-D ULTRAFINE III SHORT PEN 31G X 8 MM MISC Inject into the skin.    [name] glipiZIDE (GLUCOTROL XL) 10 MG 24 hr tablet Take 10 mg by mouth 2 (two) times daily.    . isosorbide mononitrate (IMDUR) 30 MG 24 hr tablet Take 1 tablet (30 mg total) by mouth at bedtime. 30 tablet 6  . metFORMIN (GLUCOPHAGE) 1000 MG tablet Take 1,000 mg by mouth 2 (two) times daily with a meal.    . metoprolol succinate (TOPROL XL) 50 MG 24 hr tablet Take 1.5 tablets (75 mg total) by mouth 2 (two) times daily. Take with or immediately following a meal. 90 tablet 3  . oxyCODONE-acetaminophen (PERCOCET) 5-325 MG tablet Take 1 tablet by mouth every 4 (four) hours as needed for moderate pain or severe pain. 30 tablet 0  . [START ON 01/03/2020] sacubitril-valsartan (ENTRESTO) 49-51 MG Take 1 tablet by mouth 2 (two) times daily. 60 tablet 3  . torsemide (DEMADEX) 20 MG tablet Take 20 mg by mouth daily as needed (swelling).     . nitroGLYCERIN (NITROSTAT) 0.4 MG SL tablet Place 1 tablet (0.4 mg total) under the tongue every 5 (five) minutes as needed for chest pain. (Patient not taking: Reported on 01/01/2020) 25 tablet 3   No current  facility-administered medications for this visit.    ALLERGIES:  Allergies  Allergen Reactions  . Statins     Leg pain, tolerates lipitor     PHYSICAL EXAM:  Performance status (ECOG): 1 - Symptomatic but completely ambulatory  Vitals:   01/01/20 1621  BP: (!) 163/88  Pulse: 87  Resp: 18  Temp: 98.1 F (36.7 C)  SpO2: 95%   Wt Readings from Last 3 Encounters:  01/01/20 176 lb 14.4 oz (80.2 kg)  12/24/19 180 lb 6.4 oz (81.8 kg)  11/26/19 176 lb 14.4 oz (80.2 kg)   Physical Exam Vitals reviewed.  Constitutional:      Appearance: Normal appearance.  Cardiovascular:     Rate and Rhythm: Normal rate and regular rhythm.     Pulses: Normal pulses.     Heart sounds: Normal heart sounds.  Pulmonary:     Effort: Pulmonary effort is normal.     Breath sounds: Normal breath sounds.  Musculoskeletal:     Right lower leg: No edema.     Left lower leg: No edema.  Neurological:     General: No focal deficit present.     Mental Status: Richard [name] is alert and oriented to person, place, and time.  Psychiatric:        Mood and Affect: Mood normal.        Behavior: Behavior normal.      LABORATORY DATA:  I have reviewed the labs as listed.  CBC Latest Ref Rng & Units 11/06/2019 09/17/2019 09/16/2019  WBC 4.0 - 10.5 K/uL 7.3 - 7.5  Hemoglobin 13.0 - 17.0 g/dL 13.6 13.0 12.1(L)  Hematocrit 39 - 52 % 43.0 40.1 36.7(L)  Platelets 150 - 400 K/uL 217 - 170   CMP Latest Ref Rng & Units 11/06/2019 09/17/2019 09/16/2019  Glucose 70 - 99 mg/dL 269(H) 276(H) 339(H)  BUN 8 - 23 mg/dL 28(H) 17 35(H)  Creatinine 0.61 - 1.24 mg/dL 1.26(H) 1.23 1.73(H)  Sodium 135 - 145 mmol/L 138 140 135  Potassium 3.5 - 5.1 mmol/L 3.9 4.3 4.0  Chloride 98 - 111 mmol/L 99 105 101  CO2 22 - 32 mmol/L 30 27 26   Calcium 8.9 - 10.3 mg/dL 9.5 8.8(L) 8.7(L)  Total Protein 6.5 - 8.1 g/dL 7.2 - 6.1(L)  Total Bilirubin 0.3 - 1.2 mg/dL 0.5 - 0.4  Alkaline Phos 38 - 126 U/L 108 - 83  AST 15 - 41 U/L 17 - 15  ALT 0 - 44  U/L 19 - 21    DIAGNOSTIC IMAGING:  I have independently reviewed the scans and discussed with the patient. ECHOCARDIOGRAM COMPLETE  Result Date: 12/26/2019    ECHOCARDIOGRAM REPORT   Patient Name:   Richard [name] Date of Exam: 12/26/2019 Medical Rec #:  [id number]      Height:       72.0 in Accession #:    [id number]     Weight:       180.4 lb Date of Birth:  December 20, 1945      BSA:          2.039 m Patient Age:    62 years       BP:           162/94 mmHg Patient Gender: M              HR:           75 bpm. Exam Location:  [facility] Procedure: 2D Echo, Cardiac Doppler and Color Doppler                                 MODIFIED REPORT:  This report was modified by Rozann Lesches MD on 12/26/2019 due to Additional                                   information.  Indications:     I25.119 CAD  History:         Patient has prior history of Echocardiogram examinations, most                  recent 09/16/2019. Ablation 2008 and Abnormal ECG, Bladder                  cancer and TIA, Arrythmias:Atrial Flutter,                  Signs/Symptoms:Shortness of Breath; Risk Factors:Hypertension,                  Current Smoker, Diabetes and Dyslipidemia.  Sonographer:     Jeneen Montgomery RDMS, RVT, RDCS Referring Phys:  [place] Diagnosing Phys: Rozann Lesches MD IMPRESSIONS  1. Left ventricular ejection fraction, by estimation, is 25 to 30% - significantly decreased in comparison with the prior study in July 2021. Significantly reduced global longitudinal strain of -12.5%. The left ventricle has severely decreased function.  The left ventricle demonstrates global hypokinesis with mild regional variation. There is mild left ventricular hypertrophy. Left ventricular diastolic parameters are consistent with Grade II diastolic dysfunction (pseudonormalization).  2. Right ventricular systolic function is normal. The right ventricular size is normal. There is mildly elevated pulmonary artery systolic pressure. The estimated  right ventricular systolic pressure is 80.1 mmHg.  3. The mitral valve is grossly normal. Mild mitral valve regurgitation.  4. The aortic  valve is tricuspid. Aortic valve regurgitation is trivial. Mild to moderate aortic valve sclerosis/calcification is present, without any evidence of aortic stenosis.  5. The inferior vena cava is normal in size with <50% respiratory variability, suggesting right atrial pressure of 8 mmHg. FINDINGS  Left Ventricle: Left ventricular ejection fraction, by estimation, is 25 to 30%. The left ventricle has severely decreased function. The left ventricle demonstrates global hypokinesis. The left ventricular internal cavity size was normal in size. There is mild left ventricular hypertrophy. Left ventricular diastolic parameters are consistent with Grade II diastolic dysfunction (pseudonormalization). Right Ventricle: The right ventricular size is normal. No increase in right ventricular wall thickness. Right ventricular systolic function is normal. There is mildly elevated pulmonary artery systolic pressure. The tricuspid regurgitant velocity is 2.87  m/s, and with an assumed right atrial pressure of 8 mmHg, the estimated right ventricular systolic pressure is 58.3 mmHg. Left Atrium: Left atrial size was normal in size. Right Atrium: Right atrial size was normal in size. Pericardium: There is no evidence of pericardial effusion. Mitral Valve: The mitral valve is grossly normal. Mild mitral valve regurgitation. Tricuspid Valve: The tricuspid valve is grossly normal. Tricuspid valve regurgitation is mild. Aortic Valve: The aortic valve is tricuspid. There is mild aortic valve annular calcification. Aortic valve regurgitation is trivial. Mild to moderate aortic valve sclerosis/calcification is present, without any evidence of aortic stenosis. Pulmonic Valve: The pulmonic valve was grossly normal. Pulmonic valve regurgitation is trivial. Aorta: The aortic root is normal in size and  structure. Venous: The inferior vena cava is normal in size with less than 50% respiratory variability, suggesting right atrial pressure of 8 mmHg. IAS/Shunts: No atrial level shunt detected by color flow Doppler.  LEFT VENTRICLE PLAX 2D LVIDd:         5.44 cm      Diastology LVIDs:         4.76 cm      LV e' medial:    4.06 cm/s LV PW:         1.44 cm      LV E/e' medial:  24.4 LV IVS:        0.74 cm      LV e' lateral:   5.32 cm/s LVOT diam:     1.90 cm      LV E/e' lateral: 18.6 LV SV:         39 LV SV Index:   19           2D Longitudinal Strain LVOT Area:     2.84 cm     2D Strain GLS (A2C):   10.8 %                             2D Strain GLS (A3C):   15.0 %                             2D Strain GLS (A4C):   12.0 % LV Volumes (MOD)            2D Strain GLS Avg:     12.5 % LV vol d, MOD A2C: 142.0 ml LV vol d, MOD A4C: 139.0 ml LV vol s, MOD A2C: 96.3 ml LV vol s, MOD A4C: 97.1 ml LV SV MOD A2C:     45.7 ml LV SV MOD A4C:     139.0 ml LV SV MOD BP:  42.1 ml RIGHT VENTRICLE RV S prime:     10.50 cm/s TAPSE (M-mode): 2.2 cm LEFT ATRIUM             Index LA diam:        2.50 cm 1.23 cm/m LA Vol (A2C):   42.7 ml 20.94 ml/m LA Vol (A4C):   61.0 ml 29.91 ml/m LA Biplane Vol:         25.00 ml/m  AORTIC VALVE LVOT Vmax:   70.90 cm/s LVOT Vmean:  46.800 cm/s LVOT VTI:    0.136 m  AORTA Ao Root diam: 3.20 cm MITRAL VALVE                 TRICUSPID VALVE MV Area (PHT):               TR Peak grad:   32.9 mmHg MV Decel Time: 182 msec      TR Vmax:        287.00 cm/s MR Peak grad:    91.9 mmHg MR Mean grad:    59.0 mmHg   SHUNTS MR Vmax:         479.33 cm/s Systemic VTI:  0.14 m MR Vmean:        366.0 cm/s  Systemic Diam: 1.90 cm MR PISA Eff ROA: 3 mm MV E velocity: 99.00 cm/s MV A velocity: 66.50 cm/s MV E/A ratio:  1.49 Rozann Lesches MD Electronically signed by Rozann Lesches MD Signature Date/Time: 12/26/2019/3:57:05 PM    Final (Updated)      ASSESSMENT:  1. High-grade muscle invasive bladder  cancer: -Presentation to the ER with TIA symptoms and hematuria. -24 pound weight loss in the last 6 months despite having good appetite and eating well. -CTAP on 09/16/2019 shows mass in the left aspect of the anterior bladder dome measuring 2.7 x 2.3 x 1.8 cm.No evidence of lymphadenopathy or metastatic disease in the abdomen or pelvis. Prominent subcentimeter retroperitoneal lymph nodes unchanged from 2007. -Cystoscopy, bilateral retrograde pyelography, transurethral resection of bladder tumor and right JJ ureteral stent placement on 10/08/2019. Cystoscopy showed 5 cm dome/posterior wall tumor. 1 cm papillary tumor involving the right ureteral orifice. 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 Carcinoma invades muscularis propria. -Richard [name] met with Dr. Tammi Klippel and discussed the option of chemoradiation. -Richard [name] also met with Dr. Tresa Moore and discussed surgical options.  Richard [name] is reluctant to consider surgery. -Bone scan on 11/08/2019 was negative for meta stasis. -CT chest on 11/21/2019 shows no suspicious findings for metastatic disease.  Small mediastinal lymph nodes likely reactive.  2. 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 Current active smoker, 1 pack/day for 57 years. Richard [name] is seen with his wife who is a retired Therapist, sports at [facility]. -2 brothers had lung cancer and were smokers. Granddaughter died of neuroblastoma at age 71.   PLAN:  1. High-grade muscle invasive bladder cancer: -Initially we thought of giving him concurrent 5-FU and mitomycin with radiation therapy. -Richard [name] is currently having dark red blood in the urine.  Occasional bladder pain likely from stent.  Richard [name] is taking pain medicine occasion as needed. -Dr. Conni Elliot is concerned that 5-FU could potentiate cardiotoxicity.  Hence I have recommended change in chemotherapy once his cardiac status is stabilized. -We could use low-dose weekly gemcitabine  along with radiation therapy which does not cause direct cardiotoxicity.  However we will reassess him in 6 weeks. -Richard [name] will follow up with Dr. Alyson Ingles in the interim.  2.  Poorly controlled diabetes: -Continue glipizide and Metformin.  3. Neuropathy: -Occasional numbness in the toes when his feet are swollen.  4.  CHF: -Echocardiogram on 12/26/2019 showed significant reduction in LVEF 25-30%. -[name] has recommended changing his Lopressor to Toprol-XL and starting him on Entresto. -Richard [name] has follow-up with [name] end of this month. -Richard [name] does also report occasional pain in the jaw and heartburn.   Orders placed this encounter:  No orders of the defined types were placed in this encounter.    Derek Jack, MD [place] [phone]   I, Milinda Antis, am acting as a scribe for Dr. Sanda Linger.  I, Derek Jack MD, have reviewed the above documentation for accuracy and completeness, and I agree with the above.

## 2020-01-01 NOTE — Patient Instructions (Signed)
[facility] at [facility] Discharge Instructions  You were seen today by Dr. Delton Coombes. He went over your recent results. In light of your recent heart function test, the 5-FU can be replaced with gemcitabine given twice weekly. Dr. Delton Coombes will see you back in 7 to 8 weeks for follow up.   Thank you for choosing [facility] at [facility] to provide your oncology and hematology care.  To afford each patient quality time with our provider, please arrive at least 15 minutes before your scheduled appointment time.   If you have a lab appointment with the [facility] please come in thru the Main Entrance and check in at the main information desk  You need to re-schedule your appointment should you arrive 10 or more minutes late.  We strive to give you quality time with our providers, and arriving late affects you and other patients whose appointments are after yours.  Also, if you no show three or more times for appointments you may be dismissed from the clinic at the providers discretion.     Again, thank you for choosing [facility].  Our hope is that these requests will decrease the amount of time that you wait before being seen by our physicians.       _____________________________________________________________  Should you have questions after your visit to [facility], please contact our office at [phone] between the hours of 8:00 a.m. and 4:30 p.m.  Voicemails left after 4:00 p.m. will not be returned until the following business day.  For prescription refill requests, have your pharmacy contact our office and allow 72 hours.    Cancer Center Support Programs:   > Cancer Support Group  2nd Tuesday of the month 1pm-2pm, Journey Room

## 2020-01-01 NOTE — Telephone Encounter (Signed)
Patient and wife informed and verbalized understanding of plan. Lab order faxed to [facility] lab Will pick entresto voucher up at [place] office today-aware to start entresto on Thursday

## 2020-01-03 NOTE — Telephone Encounter (Signed)
Tried to call pt at both numbers. Lft msg at one. Dr. Alyson Ingles wanted pt to try samples of Myrbetriq 25 for frequency.

## 2020-01-07 ENCOUNTER — Telehealth: Payer: Self-pay

## 2020-01-07 NOTE — Telephone Encounter (Signed)
Called pt. Pt picked up samples up front.

## 2020-01-07 NOTE — Telephone Encounter (Signed)
Called pt and told him we had samples here Dr. Alyson Ingles wanted him to try of Myrbetriq. Pt stated he would pick them up within an hr.

## 2020-01-11 ENCOUNTER — Other Ambulatory Visit: Payer: Self-pay

## 2020-01-11 ENCOUNTER — Encounter: Payer: Self-pay | Admitting: Urology

## 2020-01-11 ENCOUNTER — Ambulatory Visit (INDEPENDENT_AMBULATORY_CARE_PROVIDER_SITE_OTHER): Payer: Medicare Other | Admitting: Urology

## 2020-01-11 VITALS — BP 183/82 | HR 84 | Temp 97.7°F | Ht 72.0 in | Wt 170.0 lb

## 2020-01-11 DIAGNOSIS — C671 Malignant neoplasm of dome of bladder: Secondary | ICD-10-CM | POA: Diagnosis not present

## 2020-01-11 DIAGNOSIS — R310 Gross hematuria: Secondary | ICD-10-CM | POA: Diagnosis not present

## 2020-01-11 LAB — URINALYSIS, ROUTINE W REFLEX MICROSCOPIC

## 2020-01-11 LAB — MICROSCOPIC EXAMINATION
Bacteria, UA: NONE SEEN
Epithelial Cells (non renal): NONE SEEN /hpf (ref 0–10)
RBC, Urine: >30 /hpf — AB (ref 0–2)
Renal Epithel, UA: NONE SEEN /hpf
WBC, UA: NONE SEEN /hpf (ref 0–5)

## 2020-01-11 MED ORDER — SULFAMETHOXAZOLE-TRIMETHOPRIM 800-160 MG PO TABS: 1.0000 | ORAL_TABLET | Freq: Two times a day (BID) | ORAL | 0 refills | Status: DC

## 2020-01-11 NOTE — Progress Notes (Signed)
01/11/2020 2:06 PM   Richard [name] [birth year] [id number]  Referring provider: Wannetta Sender, FNP [facility] [address] [place],  [place] [address]  Gross hematuria  HPI: [name] is a 74yo here for followup with worsening gross hematuria. He has a ureteral stent in place. For the past week he has noted worsening gross hematuria with each void. No clots. He denies any dysuria or worsening LUTS. No fevers. UA shows blood today.    PMH: Past Medical History:  Diagnosis Date  . Bladder cancer ([facility])   . CAD (coronary artery disease)    a. 2007 - DES x 2 proximal to mid RCA b. 09/2017: DES to mid LAD and OM2. Patent stents along RCA.   . Essential hypertension   . History of atrial flutter    Status post ablation 2008 - Dr. Caryl [name]  . History of transient ischemic attack (TIA)   . Mixed hyperlipidemia    Statin intolerance  . Pneumonia   . Type 2 diabetes mellitus (HCC)    A1C 11.5 06/2017    Surgical History: Past Surgical History:  Procedure Laterality Date  . BALLOON ANGIOPLASTY, ARTERY    . BICEPS TENDON REPAIR Left   . CARDIAC ELECTROPHYSIOLOGY MAPPING AND ABLATION    . CARPAL TUNNEL RELEASE Bilateral   . cervical neck fusion     x 4  . CORONARY ANGIOPLASTY    . CORONARY STENT INTERVENTION N/A 10/17/2017   Procedure: CORONARY STENT INTERVENTION;  Surgeon: Martinique, Peter M, MD;  Location: [facility];  Service: Cardiovascular;  Laterality: N/A;  . CORONARY STENT PLACEMENT     x 2  . CYSTOSCOPY W/ RETROGRADES Bilateral 10/08/2019   Procedure: CYSTOSCOPY WITH BILATERAL RETROGRADE PYELOGRAM;  Surgeon: Richard Gustin, MD;  Location: [facility];  Service: Urology;  Laterality: Bilateral;  . CYSTOSCOPY WITH URETHRAL DILATATION  10/08/2019   Procedure: CYSTOSCOPY WITH URETHRAL DILATATION;  Surgeon: Richard Gustin, MD;  Location: [facility];  Service: Urology;;  . LEFT HEART CATH AND CORONARY ANGIOGRAPHY N/A 10/17/2017    Procedure: LEFT HEART CATH AND CORONARY ANGIOGRAPHY;  Surgeon: Martinique, Peter M, MD;  Location: [facility];  Service: Cardiovascular;  Laterality: N/A;  . ROTATOR CUFF REPAIR Right    x 4  . TIBIA FRACTURE SURGERY Left   . TRANSURETHRAL RESECTION OF BLADDER TUMOR N/A 10/08/2019   Procedure: TRANSURETHRAL RESECTION OF BLADDER TUMOR (TURBT);  Surgeon: Richard Gustin, MD;  Location: [facility];  Service: Urology;  Laterality: N/A;    Home Medications:  Allergies as of 01/11/2020      Reactions   Statins    Leg pain, tolerates lipitor       Medication List       Accurate as of January 11, 2020  2:06 PM. If you have any questions, ask your nurse or doctor.        aspirin EC 81 MG tablet Take 325 mg by mouth daily as needed for mild pain or moderate pain. Swallow whole.   aspirin-sod bicarb-citric acid 325 MG Tbef tablet Commonly known as: ALKA-SELTZER Take 650 mg by mouth every 6 (six) hours as needed (indigestion).   atorvastatin 10 MG tablet Commonly known as: LIPITOR Take 10 mg by mouth daily.   B-D ULTRAFINE III SHORT PEN 31G X 8 MM Misc Generic drug: Insulin Pen Needle Inject into the skin.   Entresto 49-51 MG Generic drug: sacubitril-valsartan Take 1  tablet by mouth 2 (two) times daily.   glipiZIDE 10 MG 24 hr tablet Commonly known as: GLUCOTROL XL Take 10 mg by mouth 2 (two) times daily.   GOODY HEADACHE PO Take 1 packet by mouth daily as needed (pain).   isosorbide mononitrate 30 MG 24 hr tablet Commonly known as: IMDUR Take 1 tablet (30 mg total) by mouth at bedtime.   metFORMIN 1000 MG tablet Commonly known as: GLUCOPHAGE Take 1,000 mg by mouth 2 (two) times daily with a meal.   metoprolol succinate 50 MG 24 hr tablet Commonly known as: Toprol XL Take 1.5 tablets (75 mg total) by mouth 2 (two) times daily. Take with or immediately following a meal.   nitroGLYCERIN 0.4 MG SL tablet Commonly known as: NITROSTAT Place 1 tablet (0.4 mg total)  under the tongue every 5 (five) minutes as needed for chest pain.   oxyCODONE-acetaminophen 5-325 MG tablet Commonly known as: Percocet Take 1 tablet by mouth every 4 (four) hours as needed for moderate pain or severe pain.   torsemide 20 MG tablet Commonly known as: DEMADEX Take 20 mg by mouth daily as needed (swelling).       Allergies:  Allergies  Allergen Reactions  . Statins     Leg pain, tolerates lipitor     Family History: Family History  Problem Relation Age of Onset  . Diabetes Mother   . Heart disease Mother   . Heart disease Brother   . Lung cancer Brother   . Emphysema Father   . Lung cancer Brother        x 2  . Lupus Daughter   . Colon cancer Neg Hx   . Esophageal cancer Neg Hx   . Rectal cancer Neg Hx   . Stomach cancer Neg Hx   . Prostate cancer Neg Hx     Social History:  reports that he has been smoking cigarettes. He has a 54.00 pack-year smoking history. He has never used smokeless tobacco. He reports that he does not drink alcohol and does not use drugs.  ROS: All other review of systems were reviewed and are negative except what is noted above in HPI  Physical Exam: BP (!) 183/82   Pulse 84   Temp 97.7 F (36.5 C)   Ht 6' (1.829 [name])   Wt 170 lb (77.1 kg)   BMI 23.06 kg/[name]   Constitutional:  Alert and oriented, No acute distress. HEENT: [place] AT, moist mucus membranes.  Trachea midline, no masses. Cardiovascular: No clubbing, cyanosis, or edema. Respiratory: Normal respiratory effort, no increased work of breathing. GI: Abdomen is soft, nontender, nondistended, no abdominal masses GU: No CVA tenderness.  Lymph: No cervical or inguinal lymphadenopathy. Skin: No rashes, bruises or suspicious lesions. Neurologic: Grossly intact, no focal deficits, moving all 4 extremities. Psychiatric: Normal mood and affect.  Laboratory Data: Lab Results  Component Value Date   WBC 7.3 11/06/2019   HGB 13.6 11/06/2019   HCT 43.0 11/06/2019   MCV 90.1  11/06/2019   PLT 217 11/06/2019    Lab Results  Component Value Date   CREATININE 1.26 (H) 11/06/2019    No results found for: PSA  No results found for: TESTOSTERONE  Lab Results  Component Value Date   HGBA1C 10.6 (H) 09/16/2019    Urinalysis    Component Value Date/Time   COLORURINE AMBER (A) 09/15/2019 2324   APPEARANCEUR CLOUDY (A) 09/15/2019 2324   LABSPEC 1.042 (H) 09/15/2019 2324   PHURINE 5.0 09/15/2019  [place] 01/11/2020 1329   HGBUR LARGE (A) 09/15/2019 2324   BILIRUBINUR NEGATIVE 09/15/2019 2324   KETONESUR NEGATIVE 09/15/2019 2324   PROTEINUR CANCELED 01/11/2020 1329   PROTEINUR 100 (A) 09/15/2019 2324   UROBILINOGEN 0.2 01/04/2007 1000   NITRITE NEGATIVE 09/15/2019 2324   LEUKOCYTESUR NEGATIVE 09/15/2019 2324    Lab Results  Component Value Date   LABMICR See below: 01/11/2020   WBCUA None seen 01/11/2020   LABEPIT None seen 01/11/2020   BACTERIA None seen 01/11/2020    Pertinent Imaging:  Results for orders placed during the hospital encounter of 11/14/12  DG Abd 1 View  Narrative CLINICAL DATA:  Abdominal pain and distention with nausea.  EXAM: ABDOMEN - 1 VIEW  COMPARISON:  None.  FINDINGS: The bowel gas pattern is normal. No radio-opaque calculi or other significant radiographic abnormality are seen.  IMPRESSION: Negative.   Electronically Signed By: Inge Rise [name].D. On: 11/16/2012 21:57  No results found for this or any previous visit.  No results found for this or any previous visit.  No results found for this or any previous visit.  No results found for this or any previous visit.  No results found for this or any previous visit.  No results found for this or any previous visit.  No results found for this or any previous visit.   Assessment & Plan:    1. 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  Gross hematuria -urine culture, we will call with results -Bactrim DS BID for 7 days -If the culture is negative we will rpoceed with cystoscopy ad fulgeration   No follow-ups on file.  [name], MD  [facility] [place]

## 2020-01-11 NOTE — Progress Notes (Signed)
Urological Symptom Review  Patient is experiencing the following symptoms: Frequent urination Burning/pain with urination Get up at night to urinate Stream starts and stops Have to strain to urinate Blood in urine Erection problems (male only)   Review of Systems  Gastrointestinal (upper)  : Negative for upper GI symptoms  Gastrointestinal (lower) : Constipation  Constitutional : Fatigue Weight loss Skin: Negative for skin symptoms  Eyes: Negative for eye symptoms  Ear/Nose/Throat : Sinus problems  Hematologic/Lymphatic: Easy bruising  Cardiovascular : Leg swelling  Respiratory : Cough Shortness of breath  Endocrine: Negative for endocrine symptoms  Musculoskeletal: Negative for musculoskeletal symptoms  Neurological: Negative for neurological symptoms  Psychologic: Negative for psychiatric symptoms

## 2020-01-11 NOTE — Patient Instructions (Signed)
Hematuria, Adult Hematuria is blood in the urine. Blood may be visible in the urine, or it may be identified with a test. This condition can be caused by infections of the bladder, urethra, kidney, or prostate. Other possible causes include:  Kidney stones.  Cancer of the urinary tract.  Too much calcium in the urine.  Conditions that are passed from parent to child (inherited conditions).  Exercise that requires a lot of energy. Infections can usually be treated with medicine, and a kidney stone usually will pass through your urine. If neither of these is the cause of your hematuria, more tests may be needed to identify the cause of your symptoms. It is very important to tell your health care provider about any blood in your urine, even if it is painless or the blood stops without treatment. Blood in the urine, when it happens and then stops and then happens again, can be a symptom of a very serious condition, including cancer. There is no pain in the initial stages of many urinary cancers. Follow these instructions at home: Medicines  Take over-the-counter and prescription medicines only as told by your health care provider.  If you were prescribed an antibiotic medicine, take it as told by your health care provider. Do not stop taking the antibiotic even if you start to feel better. Eating and drinking  Drink enough fluid to keep your urine clear or pale yellow. It is recommended that you drink 3-4 quarts (2.8-3.8 L) a day. If you have been diagnosed with an infection, it is recommended that you drink cranberry juice in addition to large amounts of water.  Avoid caffeine, tea, and carbonated beverages. These tend to irritate the bladder.  Avoid alcohol because it may irritate the prostate (men). General instructions  If you have been diagnosed with a kidney stone, follow your health care provider's instructions about straining your urine to catch the stone.  Empty your bladder  often. Avoid holding urine for long periods of time.  If you are male: ? After a bowel movement, wipe from front to back and use each piece of toilet paper only once. ? Empty your bladder before and after sex.  Pay attention to any changes in your symptoms. Tell your health care provider about any changes or any new symptoms.  It is your responsibility to get your test results. Ask your health care provider, or the department performing the test, when your results will be ready.  Keep all follow-up visits as told by your health care provider. This is important. Contact a health care provider if:  You develop back pain.  You have a fever.  You have nausea or vomiting.  Your symptoms do not improve after 3 days.  Your symptoms get worse. Get help right away if:  You develop severe vomiting and are unable take medicine without vomiting.  You develop severe pain in your back or abdomen even though you are taking medicine.  You pass a large amount of blood in your urine.  You pass blood clots in your urine.  You feel very weak or like you might faint.  You faint. Summary  Hematuria is blood in the urine. It has many possible causes.  It is very important that you tell your health care provider about any blood in your urine, even if it is painless or the blood stops without treatment.  Take over-the-counter and prescription medicines only as told by your health care provider.  Drink enough fluid to keep   your urine clear or pale yellow. This information is not intended to replace advice given to you by your health care provider. Make sure you discuss any questions you have with your health care provider. Document Revised: 07/05/2018 Document Reviewed: 03/13/2016 Elsevier Patient Education  2020 [employer]  

## 2020-01-14 LAB — URINE CULTURE

## 2020-01-15 ENCOUNTER — Other Ambulatory Visit: Payer: Self-pay

## 2020-01-15 ENCOUNTER — Encounter (HOSPITAL_COMMUNITY): Payer: Self-pay | Admitting: *Deleted

## 2020-01-15 ENCOUNTER — Telehealth: Payer: Self-pay

## 2020-01-15 ENCOUNTER — Emergency Department (HOSPITAL_COMMUNITY)
Admission: EM | Admit: 2020-01-15 | Discharge: 2020-01-15 | Disposition: A | Discharge status: Home / Self Care | Payer: Medicare Other | Attending: Emergency Medicine | Admitting: Emergency Medicine

## 2020-01-15 DIAGNOSIS — F17210 Nicotine dependence, cigarettes, uncomplicated: Secondary | ICD-10-CM | POA: Diagnosis not present

## 2020-01-15 DIAGNOSIS — Z79899 Other long term (current) drug therapy: Secondary | ICD-10-CM | POA: Insufficient documentation

## 2020-01-15 DIAGNOSIS — E119 Type 2 diabetes mellitus without complications: Secondary | ICD-10-CM | POA: Insufficient documentation

## 2020-01-15 DIAGNOSIS — Z7984 Long term (current) use of oral hypoglycemic drugs: Secondary | ICD-10-CM | POA: Insufficient documentation

## 2020-01-15 DIAGNOSIS — I2510 Atherosclerotic heart disease of native coronary artery without angina pectoris: Secondary | ICD-10-CM | POA: Insufficient documentation

## 2020-01-15 DIAGNOSIS — I119 Hypertensive heart disease without heart failure: Secondary | ICD-10-CM | POA: Diagnosis not present

## 2020-01-15 DIAGNOSIS — R319 Hematuria, unspecified: Secondary | ICD-10-CM | POA: Insufficient documentation

## 2020-01-15 DIAGNOSIS — Z8551 Personal history of malignant neoplasm of bladder: Secondary | ICD-10-CM | POA: Diagnosis not present

## 2020-01-15 DIAGNOSIS — R339 Retention of urine, unspecified: Secondary | ICD-10-CM

## 2020-01-15 DIAGNOSIS — Z7982 Long term (current) use of aspirin: Secondary | ICD-10-CM | POA: Insufficient documentation

## 2020-01-15 LAB — CBC
HCT: 37 % — ABNORMAL LOW (ref 39.0–52.0)
Hemoglobin: 11.7 g/dL — ABNORMAL LOW (ref 13.0–17.0)
MCH: 27.3 pg (ref 26.0–34.0)
MCHC: 31.6 g/dL (ref 30.0–36.0)
MCV: 86.4 fL (ref 80.0–100.0)
Platelets: 223 10*3/uL (ref 150–400)
RBC: 4.28 MIL/uL (ref 4.22–5.81)
RDW: 14.8 % (ref 11.5–15.5)
WBC: 8 10*3/uL (ref 4.0–10.5)
nRBC: 0 % (ref 0.0–0.2)

## 2020-01-15 LAB — URINALYSIS, MICROSCOPIC (REFLEX): RBC / HPF: >50 RBC/hpf (ref 0–5)

## 2020-01-15 LAB — URINALYSIS, ROUTINE W REFLEX MICROSCOPIC

## 2020-01-15 MED ORDER — DIAZEPAM 2 MG PO TABS
2.0000 mg | ORAL_TABLET | Freq: Once | ORAL | Status: AC
  Administered 2020-01-15: 2 mg via ORAL
  Filled 2020-01-15: qty 1

## 2020-01-15 MED ORDER — LIDOCAINE HCL URETHRAL/MUCOSAL 2 % EX GEL
1.0000 "application " | Freq: Once | CUTANEOUS | Status: AC
  Administered 2020-01-15: 1 via URETHRAL
  Filled 2020-01-15: qty 10

## 2020-01-15 NOTE — ED Notes (Signed)
Dr. Tyrone Nine made aware of bladder scan at this time.

## 2020-01-15 NOTE — ED Provider Notes (Signed)
[facility] EMERGENCY DEPARTMENT Provider Note   CSN: [id number] Arrival date & time: 01/15/20  1452     History Chief Complaint  Patient presents with  . Urinary Retention    Richard [name] is a 74 y.o. male.  74 yo M with a chief complaints of hematuria.  Going on for about 3 days now.  He called his urologist and was started on antibiotics.  And had worsening pain and difficulty urinating today and when he called the office they suggested he come to the ED for evaluation.  He denies fevers denies flank pain.  Has a history of bladder cancer.  Was told by his urologist that they may need to go back and then cauterize an area.  The history is provided by the patient.  Illness Severity:  Moderate Onset quality:  Gradual Duration:  3 days Timing:  Constant Progression:  Worsening Chronicity:  New Associated symptoms: no abdominal pain, no chest pain, no congestion, no diarrhea, no fever, no headaches, no myalgias, no rash, no shortness of breath and no vomiting        Past Medical History:  Diagnosis Date  . Bladder cancer ([place])   . CAD (coronary artery disease)    a. 2007 - DES x 2 proximal to mid RCA b. 09/2017: DES to mid LAD and OM2. Patent stents along RCA.   . Essential hypertension   . History of atrial flutter    Status post ablation 2008 - Dr. Caryl Comes  . History of transient ischemic attack (TIA)   . Mixed hyperlipidemia    Statin intolerance  . Pneumonia   . Type 2 diabetes mellitus ([place])    A1C 11.5 06/2017    Patient Active Problem List   Diagnosis Date Noted  . 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 Bladder mass 09/20/2019  . Hematuria 09/16/2019  . AKI (acute kidney injury) ([place]) 09/16/2019  . Pulmonary nodules 09/16/2019  . Cigarette nicotine dependence without complication 78/29/5621  . Hyperlipidemia associated with type 2 diabetes mellitus (H[facility]) - Statin intolerant 10/18/2017  . Unstable angina ([place])   . Chest pain  10/16/2017  . Statin intolerance 07/22/2017  . Stenosis of left carotid artery 07/22/2017  . Atrial flutter ([place]) - s/p ablation   . TIA (transient ischemic attack) 06/27/2017  . Medicare annual wellness visit, subsequent 01/04/2017  . Need for hepatitis C screening test 01/04/2017  . Chronic non-seasonal allergic rhinitis 04/08/2016  . Need for vaccination 04/08/2016  . DDD (degenerative disc disease), cervical 06/17/2015  . Essential hypertension 11/14/2012  . RLL pneumonia 11/14/2012  . Type 2 diabetes mellitus with complication, with long-term current use of insulin ([place]) 11/14/2012  . Coronary artery disease involving native coronary artery of native heart with unstable angina pectoris ([place]) 11/14/2012  . HIATAL HERNIA 11/24/1983    Past Surgical History:  Procedure Laterality Date  . BALLOON ANGIOPLASTY, ARTERY    . BICEPS TENDON REPAIR Left   . CARDIAC ELECTROPHYSIOLOGY MAPPING AND ABLATION    . CARPAL TUNNEL RELEASE Bilateral   . cervical neck fusion     x 4  . CORONARY ANGIOPLASTY    . CORONARY STENT INTERVENTION N/A 10/17/2017   Procedure: CORONARY STENT INTERVENTION;  Surgeon: Martinique, Peter M, MD;  Location: [place] [facility];  Service: Cardiovascular;  Laterality: N/A;  . CORONARY STENT PLACEMENT     x 2  . CYSTOSCOPY W/ RETROGRADES Bilateral 10/08/2019   Procedure: CYSTOSCOPY WITH BILATERAL RETROGRADE PYELOGRAM;  Surgeon: Cleon Gustin, MD;  Location: [facility];  Service: Urology;  Laterality: Bilateral;  . CYSTOSCOPY WITH URETHRAL DILATATION  10/08/2019   Procedure: CYSTOSCOPY WITH URETHRAL DILATATION;  Surgeon: Cleon Gustin, MD;  Location: [facility];  Service: Urology;;  . LEFT HEART CATH AND CORONARY ANGIOGRAPHY N/A 10/17/2017   Procedure: LEFT HEART CATH AND CORONARY ANGIOGRAPHY;  Surgeon: Martinique, Peter M, MD;  Location: [place] [facility];  Service: Cardiovascular;  Laterality: N/A;  . ROTATOR CUFF REPAIR Right    x 4  . TIBIA FRACTURE SURGERY Left   .  TRANSURETHRAL RESECTION OF BLADDER TUMOR N/A 10/08/2019   Procedure: TRANSURETHRAL RESECTION OF BLADDER TUMOR (TURBT);  Surgeon: Cleon Gustin, MD;  Location: [facility];  Service: Urology;  Laterality: N/A;       Family History  Problem Relation Age of Onset  . Diabetes Mother   . Heart disease Mother   . Heart disease Brother   . Lung cancer Brother   . Emphysema Father   . Lung cancer Brother        x 2  . Lupus Daughter   . Colon cancer Neg Hx   . Esophageal cancer Neg Hx   . Rectal cancer Neg Hx   . Stomach cancer Neg Hx   . Prostate cancer Neg Hx     Social History   Tobacco Use  . Smoking status: Current Every Day Smoker    Packs/day: 1.00    Years: 54.00    Pack years: 54.00    Types: Cigarettes  . Smokeless tobacco: Never Used  Vaping Use  . Vaping Use: Never used  Substance Use Topics  . Alcohol use: No    Alcohol/week: 0.0 standard drinks    Comment: rare  . Drug use: No    Home Medications Prior to Admission medications   Medication Sig Start Date End Date Taking? Authorizing Provider  aspirin EC 81 MG tablet Take 325 mg by mouth daily as needed for mild pain or moderate pain. Swallow whole.     [provider]  Aspirin-Acetaminophen-Caffeine (GOODY HEADACHE PO) Take 1 packet by mouth daily as needed (pain).    [provider]  aspirin-sod bicarb-citric acid (ALKA-SELTZER) 325 MG TBEF tablet Take 650 mg by mouth every 6 (six) hours as needed (indigestion).    [provider]  atorvastatin (LIPITOR) 10 MG tablet Take 10 mg by mouth daily. 06/08/19   [provider]  B-D ULTRAFINE III SHORT PEN 31G X 8 MM MISC Inject into the skin. 09/18/19   [provider]  glipiZIDE (GLUCOTROL XL) 10 MG 24 hr tablet Take 10 mg by mouth 2 (two) times daily. 07/16/19   [provider]  isosorbide mononitrate (IMDUR) 30 MG 24 hr tablet Take 1 tablet (30 mg total) by mouth at bedtime. 05/04/18   Satira Sark, MD    metFORMIN (GLUCOPHAGE) 1000 MG tablet Take 1,000 mg by mouth 2 (two) times daily with a meal.    [provider]  metoprolol succinate (TOPROL XL) 50 MG 24 hr tablet Take 1.5 tablets (75 mg total) by mouth 2 (two) times daily. Take with or immediately following a meal. 01/01/20   Satira Sark, MD  nitroGLYCERIN (NITROSTAT) 0.4 MG SL tablet Place 1 tablet (0.4 mg total) under the tongue every 5 (five) minutes as needed for chest pain. 07/09/19   Satira Sark, MD  oxyCODONE-acetaminophen (PERCOCET) 5-325 MG tablet Take 1 tablet by mouth every 4 (  four) hours as needed for moderate pain or severe pain. 12/28/19 12/27/20  Cleon Gustin, MD  sacubitril-valsartan (ENTRESTO) 49-51 MG Take 1 tablet by mouth 2 (two) times daily. 01/03/20   Satira Sark, MD  sulfamethoxazole-trimethoprim (BACTRIM DS) 800-160 MG tablet Take 1 tablet by mouth every 12 (twelve) hours. 01/11/20   McKenzie, Candee Furbish, MD  torsemide (DEMADEX) 20 MG tablet Take 20 mg by mouth daily as needed (swelling).  11/19/19   [provider]    Allergies    Statins  Review of Systems   Review of Systems  Constitutional: Negative for chills and fever.  HENT: Negative for congestion and facial swelling.   Eyes: Negative for discharge and visual disturbance.  Respiratory: Negative for shortness of breath.   Cardiovascular: Negative for chest pain and palpitations.  Gastrointestinal: Negative for abdominal pain, diarrhea and vomiting.  Genitourinary: Positive for hematuria.  Musculoskeletal: Negative for arthralgias and myalgias.  Skin: Negative for color change and rash.  Neurological: Negative for tremors, syncope and headaches.  Psychiatric/Behavioral: Negative for confusion and dysphoric mood.    Physical Exam Updated Vital Signs BP (!) 155/82 (BP Location: Right Arm)   Pulse 70   Temp 97.7 F (36.5 C) (Oral)   Resp 18   Ht 6' (1.829 m)   Wt 77.1 kg   SpO2 100%   BMI 23.06 kg/m    Physical Exam Vitals and nursing note reviewed.  Constitutional:      Appearance: He is well-developed.  HENT:     Head: Normocephalic and atraumatic.  Eyes:     Pupils: Pupils are equal, round, and reactive to light.  Neck:     Vascular: No JVD.  Cardiovascular:     Rate and Rhythm: Normal rate and regular rhythm.     Heart sounds: No murmur heard.  No friction rub. No gallop.   Pulmonary:     Effort: No respiratory distress.     Breath sounds: No wheezing.  Abdominal:     General: There is no distension.     Tenderness: There is abdominal tenderness (mild suprapubic). There is no guarding or rebound.  Musculoskeletal:        General: Normal range of motion.     Cervical back: Normal range of motion and neck supple.  Skin:    Coloration: Skin is not pale.     Findings: No rash.  Neurological:     Mental Status: He is alert and oriented to person, place, and time.  Psychiatric:        Behavior: Behavior normal.     ED Results / Procedures / Treatments   Labs (all labs ordered are listed, but only abnormal results are displayed) Labs Reviewed  URINALYSIS, ROUTINE W REFLEX MICROSCOPIC - Abnormal; Notable for the following components:      Result Value   Color, Urine RED (*)    APPearance HAZY (*)    Glucose, UA   (*)    Value: TEST NOT REPORTED DUE TO COLOR INTERFERENCE OF URINE PIGMENT   Hgb urine dipstick   (*)    Value: TEST NOT REPORTED DUE TO COLOR INTERFERENCE OF URINE PIGMENT   Bilirubin Urine   (*)    Value: TEST NOT REPORTED DUE TO COLOR INTERFERENCE OF URINE PIGMENT   Ketones, ur   (*)    Value: TEST NOT REPORTED DUE TO COLOR INTERFERENCE OF URINE PIGMENT   Protein, ur   (*)    Value: TEST NOT REPORTED DUE  TO COLOR INTERFERENCE OF URINE PIGMENT   Nitrite   (*)    Value: TEST NOT REPORTED DUE TO COLOR INTERFERENCE OF URINE PIGMENT   Leukocytes,Ua   (*)    Value: TEST NOT REPORTED DUE TO COLOR INTERFERENCE OF URINE PIGMENT   All other components within  normal limits  URINALYSIS, MICROSCOPIC (REFLEX) - Abnormal; Notable for the following components:   Bacteria, UA FEW (*)    All other components within normal limits  CBC - Abnormal; Notable for the following components:   Hemoglobin 11.7 (*)    HCT 37.0 (*)    All other components within normal limits  URINE CULTURE    EKG None  Radiology No results found.  Procedures Procedures (including critical care time)  Medications Ordered in ED Medications  lidocaine (XYLOCAINE) 2 % jelly 1 application (1 application Urethral Given 01/15/20 1612)  diazepam (VALIUM) tablet 2 mg (2 mg Oral Given 01/15/20 1611)    ED Course  I have reviewed the triage vital signs and the nursing notes.  Pertinent labs & imaging results that were available during my care of the patient were reviewed by me and considered in my medical decision making (see chart for details).    MDM Rules/Calculators/A&P                          74 yo M with a chief complaints of hematuria and difficulty urinating.  Going on for the past 3 days.  Started on antibiotics by his urologist.  Call today with worsening pain and sent to the ED for evaluation.  Patient was able to urinate here to provide a sample.  Will perform a bladder scan.  Bladder scan with >400cc.  Unable to urinate, foley placed.  Feeling better, family requesting cbc.  Hbg slightly down from baseline.  D/c home Urology follow up.  6:40 PM:  I have discussed the diagnosis/risks/treatment options with the patient and family and believe the pt to be eligible for discharge home to follow-up with Urology. We also discussed returning to the ED immediately if new or worsening sx occur. We discussed the sx which are most concerning (e.g., sudden worsening pain, fever, inability to tolerate by mouth, inability to urinate) that necessitate immediate return. Medications administered to the patient during their visit and any new prescriptions provided to the patient are  listed below.  Medications given during this visit Medications  lidocaine (XYLOCAINE) 2 % jelly 1 application (1 application Urethral Given 01/15/20 1612)  diazepam (VALIUM) tablet 2 mg (2 mg Oral Given 01/15/20 1611)     The patient appears reasonably screen and/or stabilized for discharge and I doubt any other medical condition or other [facility] requiring further screening, evaluation, or treatment in the ED at this time prior to discharge.   Final Clinical Impression(s) / ED Diagnoses Final diagnoses:  Urinary retention    Rx / DC Orders ED Discharge Orders    None       Deno Etienne, DO 01/15/20 1840

## 2020-01-15 NOTE — ED Notes (Signed)
ED Provider at bedside. 

## 2020-01-15 NOTE — ED Notes (Signed)
Pt provided with a leg bag at this time. Requesting to apply leg bag at a later time, ambulated out of department with drainage bag at this time.

## 2020-01-15 NOTE — Discharge Instructions (Signed)
Follow up with your urologist in the office.  Return for worsening bleeding, or if unable to urinate

## 2020-01-15 NOTE — ED Notes (Signed)
Pt provided with a warm blanket a Ginger Ale per request at this time. Pt appears in no acute distress, respirations are even and unlabored with equal chest rise and fall.

## 2020-01-15 NOTE — ED Notes (Signed)
Pt provided with a urinal for voiding at this time for I&O measurement. Pt up and ambulatory to restroom without assistance from staff.

## 2020-01-15 NOTE — ED Triage Notes (Signed)
Pt states when voiding that it's only a trickle for about 3 days.  Blood noted in urine as well.  Pain with urination.  Denies fever or N/V.

## 2020-01-15 NOTE — ED Notes (Signed)
Entered room and introduced self to patient. Pt appears to be in no acute distress, respirations are even and unlabored with equal chest rise and fall. Bed is locked in the lowest position, side rails x1 at patient request, call bell within reach. All questions and concerns voiced addressed at this time. Educated on call light use and hourly rounding and is in agreement.

## 2020-01-17 LAB — URINE CULTURE: Culture: NO GROWTH

## 2020-01-21 NOTE — Telephone Encounter (Signed)
Have him see me tomorrow for cystoscopy

## 2020-01-21 NOTE — Telephone Encounter (Signed)
Pt scheduled and notified

## 2020-01-22 ENCOUNTER — Ambulatory Visit (INDEPENDENT_AMBULATORY_CARE_PROVIDER_SITE_OTHER): Payer: Medicare Other | Admitting: Urology

## 2020-01-22 ENCOUNTER — Encounter: Payer: Self-pay | Admitting: Urology

## 2020-01-22 ENCOUNTER — Other Ambulatory Visit: Payer: Self-pay

## 2020-01-22 VITALS — BP 137/58 | HR 84 | Temp 97.9°F | Ht 72.0 in | Wt 170.0 lb

## 2020-01-22 DIAGNOSIS — Z7984 Long term (current) use of oral hypoglycemic drugs: Secondary | ICD-10-CM | POA: Diagnosis not present

## 2020-01-22 DIAGNOSIS — C671 Malignant neoplasm of dome of bladder: Secondary | ICD-10-CM

## 2020-01-22 DIAGNOSIS — E119 Type 2 diabetes mellitus without complications: Secondary | ICD-10-CM | POA: Diagnosis not present

## 2020-01-22 DIAGNOSIS — C679 Malignant neoplasm of bladder, unspecified: Secondary | ICD-10-CM | POA: Diagnosis not present

## 2020-01-22 DIAGNOSIS — F17210 Nicotine dependence, cigarettes, uncomplicated: Secondary | ICD-10-CM | POA: Diagnosis not present

## 2020-01-22 DIAGNOSIS — E1140 Type 2 diabetes mellitus with diabetic neuropathy, unspecified: Secondary | ICD-10-CM | POA: Diagnosis not present

## 2020-01-22 DIAGNOSIS — E1165 Type 2 diabetes mellitus with hyperglycemia: Secondary | ICD-10-CM | POA: Diagnosis not present

## 2020-01-22 MED ORDER — OXYCODONE-ACETAMINOPHEN 5-325 MG PO TABS: 1.0000 | ORAL_TABLET | ORAL | 0 refills | Status: DC | PRN

## 2020-01-22 MED ORDER — CIPROFLOXACIN HCL 500 MG PO TABS
500.0000 mg | ORAL_TABLET | Freq: Once | ORAL | Status: AC
  Administered 2020-01-22: 500 mg via ORAL

## 2020-01-22 MED ORDER — MIRABEGRON ER 25 MG PO TB24: 25.0000 mg | ORAL_TABLET | Freq: Every day | ORAL | 0 refills | Status: DC

## 2020-01-22 NOTE — Addendum Note (Signed)
Addended by: [name] L on: 01/22/2020 03:00 PM   Modules accepted: Orders

## 2020-01-22 NOTE — Progress Notes (Signed)
   01/22/20  CC: Gross hematuria  HPI: [name] is a 74yo here for followup for gross hematuria. Urine culture was negative.  Blood pressure (!) 137/58, pulse 84, temperature 97.9 F (36.6 C), height 6' (1.829 m), weight 170 lb (77.1 kg). NED. A&Ox3.   No respiratory distress   Abd soft, NT, ND Normal phallus with bilateral descended testicles  Cystoscopy Procedure Note  Patient identification was confirmed, informed consent was obtained, and patient was prepped using Betadine solution.  Lidocaine jelly was administered per urethral meatus.     Pre-Procedure: - Inspection reveals a normal caliber ureteral meatus.  Procedure: The flexible cystoscope was introduced without difficulty - No urethral strictures/lesions are present. - Enlarged prostate  - Normal bladder neck - Bilateral ureteral orifices identified - 60cc of clot in bladder.  -left lateral wall/dome tumor with active bleeding - No bladder stones - No trabeculation     Post-Procedure: - Patient tolerated the procedure well  Assessment/ Plan: Schedule for cystoscopy with fulgeration. Risks/benefits/alternatives discussed  No follow-ups on file.  Richard Bang, MD

## 2020-01-22 NOTE — Patient Instructions (Signed)
Hematuria, Adult Hematuria is blood in the urine. Blood may be visible in the urine, or it may be identified with a test. This condition can be caused by infections of the bladder, urethra, kidney, or prostate. Other possible causes include:  Kidney stones.  Cancer of the urinary tract.  Too much calcium in the urine.  Conditions that are passed from parent to child (inherited conditions).  Exercise that requires a lot of energy. Infections can usually be treated with medicine, and a kidney stone usually will pass through your urine. If neither of these is the cause of your hematuria, more tests may be needed to identify the cause of your symptoms. It is very important to tell your health care provider about any blood in your urine, even if it is painless or the blood stops without treatment. Blood in the urine, when it happens and then stops and then happens again, can be a symptom of a very serious condition, including cancer. There is no pain in the initial stages of many urinary cancers. Follow these instructions at home: Medicines  Take over-the-counter and prescription medicines only as told by your health care provider.  If you were prescribed an antibiotic medicine, take it as told by your health care provider. Do not stop taking the antibiotic even if you start to feel better. Eating and drinking  Drink enough fluid to keep your urine clear or pale yellow. It is recommended that you drink 3-4 quarts (2.8-3.8 L) a day. If you have been diagnosed with an infection, it is recommended that you drink cranberry juice in addition to large amounts of water.  Avoid caffeine, tea, and carbonated beverages. These tend to irritate the bladder.  Avoid alcohol because it may irritate the prostate (men). General instructions  If you have been diagnosed with a kidney stone, follow your health care provider's instructions about straining your urine to catch the stone.  Empty your bladder  often. Avoid holding urine for long periods of time.  If you are male: ? After a bowel movement, wipe from front to back and use each piece of toilet paper only once. ? Empty your bladder before and after sex.  Pay attention to any changes in your symptoms. Tell your health care provider about any changes or any new symptoms.  It is your responsibility to get your test results. Ask your health care provider, or the department performing the test, when your results will be ready.  Keep all follow-up visits as told by your health care provider. This is important. Contact a health care provider if:  You develop back pain.  You have a fever.  You have nausea or vomiting.  Your symptoms do not improve after 3 days.  Your symptoms get worse. Get help right away if:  You develop severe vomiting and are unable take medicine without vomiting.  You develop severe pain in your back or abdomen even though you are taking medicine.  You pass a large amount of blood in your urine.  You pass blood clots in your urine.  You feel very weak or like you might faint.  You faint. Summary  Hematuria is blood in the urine. It has many possible causes.  It is very important that you tell your health care provider about any blood in your urine, even if it is painless or the blood stops without treatment.  Take over-the-counter and prescription medicines only as told by your health care provider.  Drink enough fluid to keep   your urine clear or pale yellow. This information is not intended to replace advice given to you by your health care provider. Make sure you discuss any questions you have with your health care provider. Document Revised: 07/05/2018 Document Reviewed: 03/13/2016 Elsevier Patient Education  2020 [employer]  

## 2020-01-22 NOTE — Progress Notes (Signed)
Urological Symptom Review  Patient is experiencing the following symptoms: Frequent urination Burning/pain with urination Get up at night to urinate Leakage of urine Stream starts and stops Trouble starting stream Have to strain to urinate Blood in urine Weak stream Erection problems (male only) Penile pain (male only)   Kidney stones   Review of Systems  Gastrointestinal (upper)  : Negative for upper GI symptoms  Gastrointestinal (lower) : Negative for lower GI symptoms  Constitutional : Weight loss Fatigue  Skin: Negative for skin symptoms  Eyes: Blurred vision  Ear/Nose/Throat : Sinus problems  Hematologic/Lymphatic: Easy bruising  Cardiovascular : Chest pain  Respiratory : Cough Shortness of breath  Endocrine: Negative for endocrine symptoms  Musculoskeletal: Negative for musculoskeletal symptoms  Neurological: Negative for neurological symptoms  Psychologic: Negative for psychiatric symptoms

## 2020-01-23 ENCOUNTER — Other Ambulatory Visit (HOSPITAL_COMMUNITY): Payer: Self-pay

## 2020-01-23 ENCOUNTER — Other Ambulatory Visit: Payer: Self-pay

## 2020-01-23 ENCOUNTER — Inpatient Hospital Stay (HOSPITAL_COMMUNITY)
Admission: EM | Admit: 2020-01-23 | Discharge: 2020-01-29 | DRG: 668 | Disposition: A | Discharge status: Home Health | Payer: Medicare Other | Attending: Internal Medicine | Admitting: Internal Medicine

## 2020-01-23 ENCOUNTER — Encounter (HOSPITAL_COMMUNITY): Payer: Self-pay

## 2020-01-23 ENCOUNTER — Emergency Department (HOSPITAL_COMMUNITY): Payer: Medicare Other

## 2020-01-23 ENCOUNTER — Ambulatory Visit: Payer: Medicare Other | Admitting: Student

## 2020-01-23 DIAGNOSIS — I208 Other forms of angina pectoris: Secondary | ICD-10-CM | POA: Diagnosis not present

## 2020-01-23 DIAGNOSIS — C679 Malignant neoplasm of bladder, unspecified: Secondary | ICD-10-CM | POA: Diagnosis not present

## 2020-01-23 DIAGNOSIS — I25110 Atherosclerotic heart disease of native coronary artery with unstable angina pectoris: Secondary | ICD-10-CM | POA: Diagnosis present

## 2020-01-23 DIAGNOSIS — Z7982 Long term (current) use of aspirin: Secondary | ICD-10-CM

## 2020-01-23 DIAGNOSIS — Z955 Presence of coronary angioplasty implant and graft: Secondary | ICD-10-CM | POA: Diagnosis not present

## 2020-01-23 DIAGNOSIS — I340 Nonrheumatic mitral (valve) insufficiency: Secondary | ICD-10-CM | POA: Diagnosis not present

## 2020-01-23 DIAGNOSIS — I130 Hypertensive heart and chronic kidney disease with heart failure and stage 1 through stage 4 chronic kidney disease, or unspecified chronic kidney disease: Secondary | ICD-10-CM | POA: Diagnosis present

## 2020-01-23 DIAGNOSIS — E118 Type 2 diabetes mellitus with unspecified complications: Secondary | ICD-10-CM | POA: Diagnosis not present

## 2020-01-23 DIAGNOSIS — C671 Malignant neoplasm of dome of bladder: Secondary | ICD-10-CM | POA: Diagnosis not present

## 2020-01-23 DIAGNOSIS — Z789 Other specified health status: Secondary | ICD-10-CM

## 2020-01-23 DIAGNOSIS — Z20822 Contact with and (suspected) exposure to covid-19: Secondary | ICD-10-CM | POA: Diagnosis present

## 2020-01-23 DIAGNOSIS — I200 Unstable angina: Secondary | ICD-10-CM

## 2020-01-23 DIAGNOSIS — E875 Hyperkalemia: Secondary | ICD-10-CM | POA: Diagnosis not present

## 2020-01-23 DIAGNOSIS — E1169 Type 2 diabetes mellitus with other specified complication: Secondary | ICD-10-CM | POA: Diagnosis not present

## 2020-01-23 DIAGNOSIS — Z981 Arthrodesis status: Secondary | ICD-10-CM

## 2020-01-23 DIAGNOSIS — J9601 Acute respiratory failure with hypoxia: Secondary | ICD-10-CM | POA: Diagnosis not present

## 2020-01-23 DIAGNOSIS — I509 Heart failure, unspecified: Secondary | ICD-10-CM | POA: Diagnosis not present

## 2020-01-23 DIAGNOSIS — R310 Gross hematuria: Secondary | ICD-10-CM | POA: Diagnosis present

## 2020-01-23 DIAGNOSIS — I429 Cardiomyopathy, unspecified: Secondary | ICD-10-CM | POA: Diagnosis not present

## 2020-01-23 DIAGNOSIS — N029 Recurrent and persistent hematuria with unspecified morphologic changes: Secondary | ICD-10-CM | POA: Diagnosis present

## 2020-01-23 DIAGNOSIS — I10 Essential (primary) hypertension: Secondary | ICD-10-CM | POA: Diagnosis present

## 2020-01-23 DIAGNOSIS — R0603 Acute respiratory distress: Secondary | ICD-10-CM | POA: Diagnosis not present

## 2020-01-23 DIAGNOSIS — E782 Mixed hyperlipidemia: Secondary | ICD-10-CM | POA: Diagnosis not present

## 2020-01-23 DIAGNOSIS — Z794 Long term (current) use of insulin: Secondary | ICD-10-CM

## 2020-01-23 DIAGNOSIS — N179 Acute kidney failure, unspecified: Secondary | ICD-10-CM | POA: Diagnosis not present

## 2020-01-23 DIAGNOSIS — D649 Anemia, unspecified: Secondary | ICD-10-CM | POA: Diagnosis not present

## 2020-01-23 DIAGNOSIS — E1165 Type 2 diabetes mellitus with hyperglycemia: Secondary | ICD-10-CM | POA: Diagnosis present

## 2020-01-23 DIAGNOSIS — D62 Acute posthemorrhagic anemia: Secondary | ICD-10-CM | POA: Diagnosis not present

## 2020-01-23 DIAGNOSIS — F17210 Nicotine dependence, cigarettes, uncomplicated: Secondary | ICD-10-CM | POA: Diagnosis present

## 2020-01-23 DIAGNOSIS — Z79899 Other long term (current) drug therapy: Secondary | ICD-10-CM

## 2020-01-23 DIAGNOSIS — Z7984 Long term (current) use of oral hypoglycemic drugs: Secondary | ICD-10-CM

## 2020-01-23 DIAGNOSIS — I471 Supraventricular tachycardia: Secondary | ICD-10-CM | POA: Diagnosis not present

## 2020-01-23 DIAGNOSIS — Z8673 Personal history of transient ischemic attack (TIA), and cerebral infarction without residual deficits: Secondary | ICD-10-CM

## 2020-01-23 DIAGNOSIS — N1832 Chronic kidney disease, stage 3b: Secondary | ICD-10-CM | POA: Diagnosis not present

## 2020-01-23 DIAGNOSIS — I480 Paroxysmal atrial fibrillation: Secondary | ICD-10-CM | POA: Diagnosis not present

## 2020-01-23 DIAGNOSIS — R0989 Other specified symptoms and signs involving the circulatory and respiratory systems: Secondary | ICD-10-CM | POA: Diagnosis not present

## 2020-01-23 DIAGNOSIS — N1330 Unspecified hydronephrosis: Secondary | ICD-10-CM | POA: Diagnosis present

## 2020-01-23 DIAGNOSIS — C678 Malignant neoplasm of overlapping sites of bladder: Secondary | ICD-10-CM | POA: Diagnosis not present

## 2020-01-23 DIAGNOSIS — I2510 Atherosclerotic heart disease of native coronary artery without angina pectoris: Secondary | ICD-10-CM | POA: Diagnosis not present

## 2020-01-23 DIAGNOSIS — R9431 Abnormal electrocardiogram [ECG] [EKG]: Secondary | ICD-10-CM | POA: Diagnosis not present

## 2020-01-23 DIAGNOSIS — E1122 Type 2 diabetes mellitus with diabetic chronic kidney disease: Secondary | ICD-10-CM | POA: Diagnosis present

## 2020-01-23 DIAGNOSIS — I5022 Chronic systolic (congestive) heart failure: Secondary | ICD-10-CM | POA: Diagnosis present

## 2020-01-23 DIAGNOSIS — R079 Chest pain, unspecified: Secondary | ICD-10-CM | POA: Diagnosis not present

## 2020-01-23 DIAGNOSIS — R072 Precordial pain: Secondary | ICD-10-CM | POA: Diagnosis not present

## 2020-01-23 DIAGNOSIS — I4892 Unspecified atrial flutter: Secondary | ICD-10-CM | POA: Diagnosis not present

## 2020-01-23 DIAGNOSIS — I361 Nonrheumatic tricuspid (valve) insufficiency: Secondary | ICD-10-CM | POA: Diagnosis not present

## 2020-01-23 DIAGNOSIS — I209 Angina pectoris, unspecified: Secondary | ICD-10-CM | POA: Diagnosis not present

## 2020-01-23 DIAGNOSIS — E785 Hyperlipidemia, unspecified: Secondary | ICD-10-CM

## 2020-01-23 DIAGNOSIS — I6522 Occlusion and stenosis of left carotid artery: Secondary | ICD-10-CM | POA: Diagnosis not present

## 2020-01-23 DIAGNOSIS — J9811 Atelectasis: Secondary | ICD-10-CM | POA: Diagnosis not present

## 2020-01-23 DIAGNOSIS — N3289 Other specified disorders of bladder: Secondary | ICD-10-CM | POA: Diagnosis not present

## 2020-01-23 DIAGNOSIS — E119 Type 2 diabetes mellitus without complications: Secondary | ICD-10-CM

## 2020-01-23 DIAGNOSIS — K4090 Unilateral inguinal hernia, without obstruction or gangrene, not specified as recurrent: Secondary | ICD-10-CM | POA: Diagnosis not present

## 2020-01-23 DIAGNOSIS — I5042 Chronic combined systolic (congestive) and diastolic (congestive) heart failure: Secondary | ICD-10-CM | POA: Diagnosis not present

## 2020-01-23 DIAGNOSIS — R069 Unspecified abnormalities of breathing: Secondary | ICD-10-CM

## 2020-01-23 DIAGNOSIS — I110 Hypertensive heart disease with heart failure: Secondary | ICD-10-CM | POA: Diagnosis not present

## 2020-01-23 DIAGNOSIS — D494 Neoplasm of unspecified behavior of bladder: Secondary | ICD-10-CM | POA: Diagnosis not present

## 2020-01-23 HISTORY — DX: Heart failure, unspecified: I50.9

## 2020-01-23 LAB — BASIC METABOLIC PANEL
Anion gap: 10 (ref 5–15)
BUN: 46 mg/dL — ABNORMAL HIGH (ref 8–23)
CO2: 26 mmol/L (ref 22–32)
Calcium: 8.9 mg/dL (ref 8.9–10.3)
Chloride: 97 mmol/L — ABNORMAL LOW (ref 98–111)
Creatinine, Ser: 2.03 mg/dL — ABNORMAL HIGH (ref 0.61–1.24)
GFR, Estimated: 34 mL/min — ABNORMAL LOW (ref >60)
Glucose, Bld: 254 mg/dL — ABNORMAL HIGH (ref 70–99)
Potassium: 3.9 mmol/L (ref 3.5–5.1)
Sodium: 133 mmol/L — ABNORMAL LOW (ref 135–145)

## 2020-01-23 LAB — CBC
HCT: 21.8 % — ABNORMAL LOW (ref 39.0–52.0)
Hemoglobin: 7.1 g/dL — ABNORMAL LOW (ref 13.0–17.0)
MCH: 28.5 pg (ref 26.0–34.0)
MCHC: 32.6 g/dL (ref 30.0–36.0)
MCV: 87.6 fL (ref 80.0–100.0)
Platelets: 300 10*3/uL (ref 150–400)
RBC: 2.49 MIL/uL — ABNORMAL LOW (ref 4.22–5.81)
RDW: 15.5 % (ref 11.5–15.5)
WBC: 8.4 10*3/uL (ref 4.0–10.5)
nRBC: 0 % (ref 0.0–0.2)

## 2020-01-23 LAB — TROPONIN I (HIGH SENSITIVITY)
Troponin I (High Sensitivity): 29 ng/L — ABNORMAL HIGH (ref <18)
Troponin I (High Sensitivity): 30 ng/L — ABNORMAL HIGH (ref <18)

## 2020-01-23 LAB — GLUCOSE, CAPILLARY
Glucose-Capillary: 346 mg/dL — ABNORMAL HIGH (ref 70–99)
Glucose-Capillary: 351 mg/dL — ABNORMAL HIGH (ref 70–99)

## 2020-01-23 LAB — HEMOGLOBIN A1C
Hgb A1c MFr Bld: 9.6 % — ABNORMAL HIGH (ref 4.8–5.6)
Mean Plasma Glucose: 228.82 mg/dL

## 2020-01-23 LAB — HEMOGLOBIN AND HEMATOCRIT, BLOOD
HCT: 26.4 % — ABNORMAL LOW (ref 39.0–52.0)
Hemoglobin: 8.6 g/dL — ABNORMAL LOW (ref 13.0–17.0)

## 2020-01-23 LAB — RESP PANEL BY RT-PCR (FLU A&B, COVID) ARPGX2
Influenza A by PCR: NEGATIVE
Influenza B by PCR: NEGATIVE
SARS Coronavirus 2 by RT PCR: NEGATIVE

## 2020-01-23 LAB — CBG MONITORING, ED: Glucose-Capillary: 249 mg/dL — ABNORMAL HIGH (ref 70–99)

## 2020-01-23 LAB — PREPARE RBC (CROSSMATCH)

## 2020-01-23 MED ORDER — ACETAMINOPHEN 650 MG RE SUPP: 650.0000 mg | Freq: Four times a day (QID) | RECTAL | Status: DC | PRN

## 2020-01-23 MED ORDER — ATORVASTATIN CALCIUM 10 MG PO TABS
10.0000 mg | ORAL_TABLET | Freq: Every day | ORAL | Status: DC
  Administered 2020-01-23 – 2020-01-29 (×7): 10 mg via ORAL
  Filled 2020-01-23 (×8): qty 1

## 2020-01-23 MED ORDER — ASPIRIN EC 325 MG PO TBEC
325.0000 mg | DELAYED_RELEASE_TABLET | Freq: Every day | ORAL | Status: DC
  Administered 2020-01-24: 325 mg via ORAL
  Filled 2020-01-23: qty 1

## 2020-01-23 MED ORDER — BISACODYL 5 MG PO TBEC: 5.0000 mg | DELAYED_RELEASE_TABLET | Freq: Every day | ORAL | Status: DC | PRN

## 2020-01-23 MED ORDER — ISOSORBIDE MONONITRATE ER 30 MG PO TB24
30.0000 mg | ORAL_TABLET | Freq: Every day | ORAL | Status: DC
  Administered 2020-01-23 – 2020-01-28 (×6): 30 mg via ORAL
  Filled 2020-01-23 (×8): qty 1

## 2020-01-23 MED ORDER — INSULIN ASPART 100 UNIT/ML ~~LOC~~ SOLN: 0.0000 [IU] | Freq: Three times a day (TID) | SUBCUTANEOUS | Status: DC

## 2020-01-23 MED ORDER — MIRABEGRON ER 25 MG PO TB24
25.0000 mg | ORAL_TABLET | Freq: Every day | ORAL | Status: DC
  Administered 2020-01-24 – 2020-01-26 (×2): 25 mg via ORAL
  Filled 2020-01-23 (×3): qty 1

## 2020-01-23 MED ORDER — METOPROLOL TARTRATE 5 MG/5ML IV SOLN
5.0000 mg | Freq: Once | INTRAVENOUS | Status: DC
  Filled 2020-01-23: qty 5

## 2020-01-23 MED ORDER — AMLODIPINE BESYLATE 5 MG PO TABS
2.5000 mg | ORAL_TABLET | Freq: Every day | ORAL | Status: DC
  Administered 2020-01-24 – 2020-01-29 (×6): 2.5 mg via ORAL
  Filled 2020-01-23 (×7): qty 1

## 2020-01-23 MED ORDER — INSULIN ASPART 100 UNIT/ML ~~LOC~~ SOLN
0.0000 [IU] | Freq: Three times a day (TID) | SUBCUTANEOUS | Status: DC
  Administered 2020-01-24: 2 [IU] via SUBCUTANEOUS
  Administered 2020-01-24: 3 [IU] via SUBCUTANEOUS
  Administered 2020-01-24: 5 [IU] via SUBCUTANEOUS
  Administered 2020-01-25: 2 [IU] via SUBCUTANEOUS
  Administered 2020-01-25: 5 [IU] via SUBCUTANEOUS
  Administered 2020-01-25: 2 [IU] via SUBCUTANEOUS
  Administered 2020-01-26: 3 [IU] via SUBCUTANEOUS
  Administered 2020-01-27: 1 [IU] via SUBCUTANEOUS
  Administered 2020-01-27: 7 [IU] via SUBCUTANEOUS
  Administered 2020-01-29: 2 [IU] via SUBCUTANEOUS

## 2020-01-23 MED ORDER — POTASSIUM CHLORIDE CRYS ER 20 MEQ PO TBCR
40.0000 meq | EXTENDED_RELEASE_TABLET | Freq: Once | ORAL | Status: AC
  Administered 2020-01-23: 40 meq via ORAL
  Filled 2020-01-23: qty 2

## 2020-01-23 MED ORDER — NITROGLYCERIN 0.4 MG SL SUBL: 0.4000 mg | SUBLINGUAL_TABLET | SUBLINGUAL | Status: DC | PRN

## 2020-01-23 MED ORDER — FUROSEMIDE 10 MG/ML IJ SOLN
40.0000 mg | Freq: Once | INTRAMUSCULAR | Status: AC
  Administered 2020-01-23: 40 mg via INTRAVENOUS
  Filled 2020-01-23: qty 4

## 2020-01-23 MED ORDER — SODIUM CHLORIDE 0.9 % IV SOLN
10.0000 mL/h | Freq: Once | INTRAVENOUS | Status: AC
  Administered 2020-01-23: 10 mL/h via INTRAVENOUS

## 2020-01-23 MED ORDER — ONDANSETRON HCL 4 MG/2ML IJ SOLN
4.0000 mg | Freq: Four times a day (QID) | INTRAMUSCULAR | Status: DC | PRN
  Administered 2020-01-24 (×2): 4 mg via INTRAVENOUS
  Filled 2020-01-23 (×3): qty 2

## 2020-01-23 MED ORDER — ASPIRIN 81 MG PO CHEW
324.0000 mg | CHEWABLE_TABLET | Freq: Once | ORAL | Status: AC
  Administered 2020-01-23: 324 mg via ORAL
  Filled 2020-01-23: qty 4

## 2020-01-23 MED ORDER — TORSEMIDE 20 MG PO TABS
20.0000 mg | ORAL_TABLET | Freq: Every day | ORAL | Status: DC
  Administered 2020-01-24: 20 mg via ORAL
  Filled 2020-01-23 (×3): qty 1

## 2020-01-23 MED ORDER — CHLORHEXIDINE GLUCONATE CLOTH 2 % EX PADS
6.0000 | MEDICATED_PAD | Freq: Every day | CUTANEOUS | Status: DC
  Administered 2020-01-25 – 2020-01-27 (×3): 6 via TOPICAL

## 2020-01-23 MED ORDER — METOPROLOL TARTRATE 5 MG/5ML IV SOLN
2.5000 mg | Freq: Four times a day (QID) | INTRAVENOUS | Status: DC
  Administered 2020-01-23 – 2020-01-24 (×3): 2.5 mg via INTRAVENOUS
  Filled 2020-01-23 (×3): qty 5

## 2020-01-23 MED ORDER — SODIUM CHLORIDE 0.9 % IR SOLN
3000.0000 mL | Status: DC
  Administered 2020-01-23 – 2020-01-25 (×23): 3000 mL

## 2020-01-23 MED ORDER — MORPHINE SULFATE (PF) 2 MG/ML IV SOLN
2.0000 mg | Freq: Once | INTRAVENOUS | Status: AC
  Administered 2020-01-23: 2 mg via INTRAVENOUS
  Filled 2020-01-23: qty 1

## 2020-01-23 MED ORDER — HYDROMORPHONE HCL 1 MG/ML IJ SOLN
0.5000 mg | INTRAMUSCULAR | Status: DC | PRN
  Administered 2020-01-23: 1 mg via INTRAVENOUS
  Administered 2020-01-23: 0.5 mg via INTRAVENOUS
  Administered 2020-01-23 – 2020-01-24 (×4): 1 mg via INTRAVENOUS
  Filled 2020-01-23 (×6): qty 1

## 2020-01-23 MED ORDER — INSULIN ASPART 100 UNIT/ML ~~LOC~~ SOLN
2.0000 [IU] | Freq: Three times a day (TID) | SUBCUTANEOUS | Status: DC
  Administered 2020-01-24 – 2020-01-26 (×5): 2 [IU] via SUBCUTANEOUS

## 2020-01-23 MED ORDER — INSULIN ASPART 100 UNIT/ML ~~LOC~~ SOLN
0.0000 [IU] | Freq: Every day | SUBCUTANEOUS | Status: DC
  Administered 2020-01-23: 5 [IU] via SUBCUTANEOUS
  Administered 2020-01-26: 3 [IU] via SUBCUTANEOUS

## 2020-01-23 MED ORDER — ONDANSETRON HCL 4 MG PO TABS: 4.0000 mg | ORAL_TABLET | Freq: Four times a day (QID) | ORAL | Status: DC | PRN

## 2020-01-23 MED ORDER — ACETAMINOPHEN 325 MG PO TABS
650.0000 mg | ORAL_TABLET | Freq: Four times a day (QID) | ORAL | Status: DC | PRN
  Administered 2020-01-23: 650 mg via ORAL
  Filled 2020-01-23: qty 2

## 2020-01-23 NOTE — ED Notes (Signed)
Pt in bed, pt c/o pain and feeling like his foley is clogged, flushed foley cath, several large clots out.  Pt states that he feels much better .

## 2020-01-23 NOTE — Progress Notes (Signed)
Ordered 3 way f/c and irrigation supplies await arrival.Has completed 2nd unit of PRBC's no problem.

## 2020-01-23 NOTE — ED Notes (Signed)
Pt in bed, pt denies pain at this time, pt denies sob, pt states that he feels better than when he got here today.  Pt satting 97 on O2 sat monitor.

## 2020-01-23 NOTE — ED Provider Notes (Signed)
[facility], [place] EMERGENCY DEPARTMENT Provider Note   CSN: [id number] Arrival date & time: 01/23/20  3149     History Chief Complaint  Patient presents with  . Jaw Pain    Richard [name] is a 74 y.o. male who presents to the ED with a cc of jaw pain.  He has a past medical history of of CAD stent placement x4, CHF, hypertension, history of atrial flutter status post ablation, hyperlipidemia, type 2 diabetes and known malignant neoplasm of the urinary bladder.  The patient is currently catheterized.  History is delivered by the patient as well as his wife who is a Marine scientist and his daughter who is a respiratory therapist here at [facility]. Over the weekend the patient began having clots and bleeding.  His urinary catheter became occluded and his wife did not have the material to flush it.  She cut the balloon and pulled the catheter at which time the patient had to strain to get the clots out of his bladder.  He began having some pain in between his shoulder blades and in his gums and jaw.  This was similar to previous episodes of angina resulting in him going to the Cath Lab.  He was seen yesterday by his urologist Dr. Alyson Ingles.  He had a cystoscopy done however there was so much bleeding that his urologist was only able to flush his bladder and replace his catheter.  He states it has been flowing well.  Last night he had recurrence of the pain and had to take a nitroglycerin which relieved the pain.  This morning he again had the pain which was severe.  He has taken 2 sublingual nitroglycerin and is currently pain-free.  He feels as though his catheter is draining appropriately.  Patient is on daily aspirin but did not take 1 today.  Progression guide despite everything  HPI     Past Medical History:  Diagnosis Date  . Bladder cancer ([place])   . CAD (coronary artery disease)    a. 2007 - DES x 2 proximal to mid RCA b. 09/2017: DES to mid LAD and OM2. Patent stents along RCA.   [name] CHF (congestive  heart failure) ([place])   . Essential hypertension   . History of atrial flutter    Status post ablation 2008 - Dr. Caryl Comes  . History of transient ischemic attack (TIA)   . Mixed hyperlipidemia    Statin intolerance  . Pneumonia   . Type 2 diabetes mellitus ([place])    A1C 11.5 06/2017    Patient Active Problem List   Diagnosis Date Noted  . 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 Bladder mass 09/20/2019  . Hematuria 09/16/2019  . AKI (acute kidney injury) ([place]) 09/16/2019  . Pulmonary nodules 09/16/2019  . Cigarette nicotine dependence without complication 70/26/3785  . Hyperlipidemia associated with type 2 diabetes mellitus (H[facility]) - Statin intolerant 10/18/2017  . Unstable angina ([place])   . Chest pain 10/16/2017  . Statin intolerance 07/22/2017  . Stenosis of left carotid artery 07/22/2017  . Atrial flutter ([place]) - s/p ablation   . TIA (transient ischemic attack) 06/27/2017  . Medicare annual wellness visit, subsequent 01/04/2017  . Need for hepatitis C screening test 01/04/2017  . Chronic non-seasonal allergic rhinitis 04/08/2016  . Need for vaccination 04/08/2016  . DDD (degenerative disc disease), cervical 06/17/2015  . Essential hypertension 11/14/2012  . RLL pneumonia 11/14/2012  . Type 2 diabetes mellitus with complication, with long-term  current use of insulin ([place]) 11/14/2012  . Coronary artery disease involving native coronary artery of native heart with unstable angina pectoris ([place]) 11/14/2012  . HIATAL HERNIA 11/24/1983    Past Surgical History:  Procedure Laterality Date  . BALLOON ANGIOPLASTY, ARTERY    . BICEPS TENDON REPAIR Left   . CARDIAC ELECTROPHYSIOLOGY MAPPING AND ABLATION    . CARPAL TUNNEL RELEASE Bilateral   . cervical neck fusion     x 4  . CORONARY ANGIOPLASTY    . CORONARY STENT INTERVENTION N/A 10/17/2017   Procedure: CORONARY STENT INTERVENTION;  Surgeon: Martinique, Peter M, MD;  Location: [place] [facility];  Service:  Cardiovascular;  Laterality: N/A;  . CORONARY STENT PLACEMENT     x 2  . CYSTOSCOPY W/ RETROGRADES Bilateral 10/08/2019   Procedure: CYSTOSCOPY WITH BILATERAL RETROGRADE PYELOGRAM;  Surgeon: Cleon Gustin, MD;  Location: [facility];  Service: Urology;  Laterality: Bilateral;  . CYSTOSCOPY WITH URETHRAL DILATATION  10/08/2019   Procedure: CYSTOSCOPY WITH URETHRAL DILATATION;  Surgeon: Cleon Gustin, MD;  Location: [facility];  Service: Urology;;  . LEFT HEART CATH AND CORONARY ANGIOGRAPHY N/A 10/17/2017   Procedure: LEFT HEART CATH AND CORONARY ANGIOGRAPHY;  Surgeon: Martinique, Peter M, MD;  Location: [place] [facility];  Service: Cardiovascular;  Laterality: N/A;  . ROTATOR CUFF REPAIR Right    x 4  . TIBIA FRACTURE SURGERY Left   . TRANSURETHRAL RESECTION OF BLADDER TUMOR N/A 10/08/2019   Procedure: TRANSURETHRAL RESECTION OF BLADDER TUMOR (TURBT);  Surgeon: Cleon Gustin, MD;  Location: [facility];  Service: Urology;  Laterality: N/A;       Family History  Problem Relation Age of Onset  . Diabetes Mother   . Heart disease Mother   . Heart disease Brother   . Lung cancer Brother   . Emphysema Father   . Lung cancer Brother        x 2  . Lupus Daughter   . Colon cancer Neg Hx   . Esophageal cancer Neg Hx   . Rectal cancer Neg Hx   . Stomach cancer Neg Hx   . Prostate cancer Neg Hx     Social History   Tobacco Use  . Smoking status: Current Every Day Smoker    Packs/day: 1.00    Years: 54.00    Pack years: 54.00    Types: Cigarettes  . Smokeless tobacco: Never Used  Vaping Use  . Vaping Use: Never used  Substance Use Topics  . Alcohol use: No    Alcohol/week: 0.0 standard drinks    Comment: rare  . Drug use: No    Home Medications Prior to Admission medications   Medication Sig Start Date End Date Taking? Authorizing Provider  aspirin EC 81 MG tablet Take 325 mg by mouth daily as needed for mild pain or moderate pain. Swallow whole.    Yes [provider]  Aspirin-Acetaminophen-Caffeine (GOODY HEADACHE PO) Take 1 packet by mouth daily as needed (pain).   Yes [provider]  aspirin-sod bicarb-citric acid (ALKA-SELTZER) 325 MG TBEF tablet Take 650 mg by mouth every 6 (six) hours as needed (indigestion).   Yes [provider]  atorvastatin (LIPITOR) 10 MG tablet Take 10 mg by mouth daily. 06/08/19  Yes [provider]  glipiZIDE (GLUCOTROL XL) 10 MG 24 hr tablet Take 10 mg by mouth 2 (two) times daily. 07/16/19  Yes [provider]  isosorbide mononitrate (IMDUR) 30 MG 24 hr tablet  Take 1 tablet (30 mg total) by mouth at bedtime. 05/04/18  Yes Satira Sark, MD  metFORMIN (GLUCOPHAGE) 1000 MG tablet Take 1,000 mg by mouth 2 (two) times daily with a meal.   Yes [provider]  mirabegron ER (MYRBETRIQ) 25 MG TB24 tablet Take 1 tablet (25 mg total) by mouth daily. 01/22/20  Yes McKenzie, Candee Furbish, MD  nitroGLYCERIN (NITROSTAT) 0.4 MG SL tablet Place 1 tablet (0.4 mg total) under the tongue every 5 (five) minutes as needed for chest pain. 07/09/19  Yes Satira Sark, MD  oxyCODONE-acetaminophen (PERCOCET) 5-325 MG tablet Take 1 tablet by mouth every 4 (four) hours as needed for moderate pain or severe pain. 01/22/20 01/21/21 Yes McKenzie, Candee Furbish, MD  sacubitril-valsartan (ENTRESTO) 49-51 MG Take 1 tablet by mouth 2 (two) times daily. 01/03/20  Yes Satira Sark, MD  torsemide (DEMADEX) 20 MG tablet Take 20 mg by mouth daily as needed (swelling).  11/19/19  Yes [provider]  amLODipine (NORVASC) 2.5 MG tablet Take 2.5 mg by mouth daily.    [provider]  B-D ULTRAFINE III SHORT PEN 31G X 8 MM MISC Inject into the skin. 09/18/19   [provider]  metoprolol succinate (TOPROL XL) 50 MG 24 hr tablet Take 1.5 tablets (75 mg total) by mouth 2 (two) times daily. Take with or immediately following a meal. Patient not taking: Reported on 01/23/2020 01/01/20   Satira Sark, MD  sulfamethoxazole-trimethoprim (BACTRIM DS) 800-160 MG tablet Take 1 tablet by mouth every 12 (twelve) hours. Patient not taking: Reported on 01/23/2020 01/11/20   Cleon Gustin, MD    Allergies    Statins  Review of Systems   Review of Systems Ten systems reviewed and are negative for acute change, except as noted in the HPI.  Physical Exam Updated Vital Signs BP 129/70   Pulse 92   Temp 98.1 F (36.7 C) (Oral)   Resp 13   Ht 6' (1.829 m)   Wt 77.1 kg   SpO2 99%   BMI 23.06 kg/m   Physical Exam Vitals and nursing note reviewed.  Constitutional:      General: He is not in acute distress.    Appearance: He is well-developed and underweight. He is ill-appearing. He is not diaphoretic.  HENT:     Head: Normocephalic and atraumatic.  Eyes:     General: No scleral icterus.    Extraocular Movements: Extraocular movements intact.     Conjunctiva/sclera: Conjunctivae normal.     Pupils: Pupils are equal, round, and reactive to light.  Cardiovascular:     Rate and Rhythm: Normal rate and regular rhythm.     Heart sounds: Normal heart sounds.  Pulmonary:     Effort: Pulmonary effort is normal. No respiratory distress.     Breath sounds: Rhonchi (clear with cough) present.  Abdominal:     Palpations: Abdomen is soft.     Tenderness: There is no abdominal tenderness.  Genitourinary:    Comments: Foley cath in place- foley bag is full of dark blood Musculoskeletal:     Cervical back: Normal range of motion and neck supple.  Skin:    General: Skin is warm and dry.  Neurological:     Mental Status: He is alert.  Psychiatric:        Behavior: Behavior normal.     ED Results / Procedures / Treatments   Labs (all labs ordered are listed, but only abnormal results are  displayed) Labs Reviewed  CBG MONITORING, ED - Abnormal; Notable for the following components:      Result Value   Glucose-Capillary 249 (*)    All other components within normal limits    CBC  BASIC METABOLIC PANEL  TROPONIN I (HIGH SENSITIVITY)    EKG EKG Interpretation  Date/Time:  Wednesday January 23 2020 09:38:28 EST Ventricular Rate:  96 PR Interval:    QRS Duration: 101 QT Interval:  407 QTC Calculation: 515 R Axis:   70 Text Interpretation: Sinus tachycardia Multiple premature complexes, vent & supraven Nonspecific T abnormalities, lateral leads Prolonged QT interval Confirmed by Milton Ferguson [id number]) on 01/23/2020 10:20:42 AM   Radiology No results found.  Procedures .Critical Care Performed by: Margarita Mail, PA-C Authorized by: Margarita Mail, PA-C   Critical care provider statement:    Critical care time (minutes):  60   Critical care time was exclusive of:  Separately billable procedures and treating other patients   Critical care was necessary to treat or prevent imminent or life-threatening deterioration of the following conditions:  Cardiac failure and circulatory failure   Critical care was time spent personally by me on the following activities:  Discussions with consultants, evaluation of patient's response to treatment, examination of patient, ordering and performing treatments and interventions, ordering and review of laboratory studies, ordering and review of radiographic studies, pulse oximetry, re-evaluation of patient's condition, obtaining history from patient or surrogate and review of old charts   (including critical care time)  Medications Ordered in ED Medications - No data to display  ED Course  I have reviewed the triage vital signs and the nursing notes.  Pertinent labs & imaging results that were available during my care of the patient were reviewed by me and considered in my medical decision making (see chart for details).  Clinical Course as of Jan 22 1814  Wed Jan 23, 2020  1118 Troponin I (High Sensitivity)(!) [AH]  1118 Creatinine(!): 2.03 [AH]  1118 BUN(!): 46 [AH]  1810 Troponin I (High Sensitivity)(!): 30  [AH]    Clinical Course User Index [AH] Margarita Mail, PA-C   MDM Rules/Calculators/A&P                          CC:JAW PAIN/CP  VS:  Vitals:   01/23/20 1600 01/23/20 1616 01/23/20 1630 01/23/20 1728  BP: 133/62 (!) 144/72 133/64 138/72  Pulse: 93 97 91 96  Resp: 15 15 16 18   Temp:  98 F (36.7 C)  98.3 F (36.8 C)  TempSrc:  Oral  Oral  SpO2: 97% 94% 96% (!) 68%  Weight:      Height:        OV:FIEPPIR is gathered by patient and EMR, family at bedside. Previous records obtained and reviewed. DDX:The patient's complaint of cp involves an extensive number of diagnostic and treatment options, and is a complaint that carries with it a high risk of complications, morbidity, and potential mortality. Given the large differential diagnosis, medical decision making is of high complexity. The emergent differential diagnosis of chest pain includes: Acute coronary syndrome, pericarditis, aortic dissection, pulmonary embolism, tension pneumothorax, pneumonia, and esophageal rupture.   Labs: I ordered reviewed and interpreted labs which include cbc which is no leukocytosis.  Hemoglobin of 7.1 representing a 4 g drop in the last 8 days BMP shows sodium of 133 of the setting of elevated blood glucose, elevated BUN of 46 and mild elevation in patient's creatinine 3  to 2.0 up from 1.26 Troponin elevated at 29 Covid test negative, Imaging: I ordered and reviewed images which included portable 1 view chest x-ray. I independently visualized and interpreted all imaging. There are no acute, significant findings on today's images. EKG: EKG shows sinus rhythm at a rate of 96, multiple premature atrial complexes, nonspecific T wave abnormalities and a prolonged QT interval of 515  consults: I consulted with both Dr. Noah Delaine of urology and [name] of cardiology MDM: Patient here with anginal symptoms in the setting of acute blood loss anemia from known bladder mass with active hemorrhage.   Unfortunately this patient has metastatic bladder cancer and has significant cardiomyopathy which has placed him in a position to not be able to receive chemotherapy.  Plan at this time was for Dr. Alyson Ingles patient is eventually to take him to the OR for cystoscopy in order to cauterize bleeding however currently will not clear for surgery.  Patient also is not a candidate for cardiac catheterization.  Currently patient will get blood transfusion I have ordered 2 units.  Expect he will get supportive care.  Of answered all questions the best my ability.  Patient will be [facility].  Case discussed with Dr. Wynetta Emery. Patient disposition:The patient appears reasonably stabilized for admission considering the current resources, flow, and capabilities available in the ED at this time, and I doubt any other [facility] requiring further screening and/or treatment in the ED prior to admission.        Final Clinical Impression(s) / ED Diagnoses Final diagnoses:  None    Rx / DC Orders ED Discharge Orders    None       Margarita Mail, PA-C 01/23/20 1821    Milton Ferguson, MD 01/24/20 1108

## 2020-01-23 NOTE — Progress Notes (Signed)
Pt has CP/ jaw pain when his bladder pain/ penile pain increases. Pt CP-free otherwise. 3-way Foley placed. CBI initiated. EKG performed. MD paged.

## 2020-01-23 NOTE — Progress Notes (Signed)
On call Md called back new orders noted for bladder irrgation and 3 way f/c reported he talked with urology they reported no problems placing f/c yesterday was ok to change f/c out. Patient aware of above agreeable with plan.

## 2020-01-23 NOTE — ED Notes (Signed)
Pt in bed, pt states that his penis pain is better, and his chest and jaw pain have resolved, states that when he had the spasm in his penis earlier he had the jaw and chest pain, then when his penis pain got better his chest pain resolved.  Called lab, aprox 40 min for blood to be ready.

## 2020-01-23 NOTE — ED Notes (Signed)
Pt in bed, pt denies pain, pt denies pain at iv site, denies itchiness, no redness noted.

## 2020-01-23 NOTE — Progress Notes (Signed)
Paged by floor nurse due to patient having recurrent pain due to recurrent bladder clots requiring frequent flushing.  Requesting replacement of current Foley catheter to three-way Foley catheter for continuous irrigation too to reduce need for flushing and improved patient comfort.  Contacted patient's urologist who was on call Dr. Alyson Ingles with [facility] urology, who stated there was no difficulty placing patient's current catheter and it would be okay to exchange for three-way.  If there are any issues requiring formal urologic consultation on call urology from [facility] is to be consulted. - Three-way Foley catheter with continuous irrigation ordered  Pearson Grippe DO 6:28 pm

## 2020-01-23 NOTE — Plan of Care (Signed)

## 2020-01-23 NOTE — Plan of Care (Signed)
  Problem: Education: Goal: Ability to demonstrate management of disease process will improve 01/23/2020 1935 by Arlina Robes, RN Outcome: Progressing 01/23/2020 1934 by Arlina Robes, RN Outcome: Progressing 01/23/2020 1933 by Arlina Robes, RN Outcome: Progressing Goal: Ability to verbalize understanding of medication therapies will improve 01/23/2020 1935 by Arlina Robes, RN Outcome: Progressing 01/23/2020 1934 by Arlina Robes, RN Outcome: Progressing 01/23/2020 1933 by Arlina Robes, RN Outcome: Progressing Goal: Individualized Educational Video(s) 01/23/2020 1935 by Arlina Robes, RN Outcome: Progressing 01/23/2020 1934 by Arlina Robes, RN Outcome: Progressing 01/23/2020 1933 by Arlina Robes, RN Outcome: Progressing   Problem: Activity: Goal: Capacity to carry out activities will improve 01/23/2020 1935 by Arlina Robes, RN Outcome: Progressing 01/23/2020 1934 by Arlina Robes, RN Outcome: Progressing 01/23/2020 1933 by Arlina Robes, RN Outcome: Progressing   Problem: Cardiac: Goal: Ability to achieve and maintain adequate cardiopulmonary perfusion will improve 01/23/2020 1935 by Arlina Robes, RN Outcome: Progressing 01/23/2020 1934 by Arlina Robes, RN Outcome: Progressing 01/23/2020 1933 by Arlina Robes, RN Outcome: Progressing   Problem: Education: Goal: Understanding of cardiac disease, CV risk reduction, and recovery process will improve 01/23/2020 1935 by Arlina Robes, RN Outcome: Progressing 01/23/2020 1934 by Arlina Robes, RN Outcome: Progressing Goal: Individualized Educational Video(s) 01/23/2020 1935 by Arlina Robes, RN Outcome: Progressing 01/23/2020 1934 by Arlina Robes, RN Outcome: Progressing   Problem: Activity: Goal: Ability to tolerate increased activity will improve 01/23/2020 1935 by Arlina Robes, RN Outcome: Progressing 01/23/2020 1934 by  Arlina Robes, RN Outcome: Progressing   Problem: Cardiac: Goal: Ability to achieve and maintain adequate cardiovascular perfusion will improve 01/23/2020 1935 by Arlina Robes, RN Outcome: Progressing 01/23/2020 1934 by Arlina Robes, RN Outcome: Progressing   Problem: Health Behavior/Discharge Planning: Goal: Ability to safely manage health-related needs after discharge will improve 01/23/2020 1935 by Arlina Robes, RN Outcome: Progressing 01/23/2020 1934 by Arlina Robes, RN Outcome: Progressing   Problem: Education: Goal: Knowledge of General Education information will improve Description: Including pain rating scale, medication(s)/side effects and non-pharmacologic comfort measures Outcome: Progressing   Problem: Health Behavior/Discharge Planning: Goal: Ability to manage health-related needs will improve Outcome: Progressing   Problem: Clinical Measurements: Goal: Ability to maintain clinical measurements within normal limits will improve Outcome: Progressing Goal: Will remain free from infection Outcome: Progressing Goal: Diagnostic test results will improve Outcome: Progressing Goal: Respiratory complications will improve Outcome: Progressing Goal: Cardiovascular complication will be avoided Outcome: Progressing   Problem: Activity: Goal: Risk for activity intolerance will decrease Outcome: Progressing   Problem: Nutrition: Goal: Adequate nutrition will be maintained Outcome: Progressing   Problem: Coping: Goal: Level of anxiety will decrease Outcome: Progressing   Problem: Elimination: Goal: Will not experience complications related to bowel motility Outcome: Progressing Goal: Will not experience complications related to urinary retention Outcome: Progressing   Problem: Pain Managment: Goal: General experience of comfort will improve Outcome: Progressing   Problem: Safety: Goal: Ability to remain free from injury will  improve Outcome: Progressing   Problem: Skin Integrity: Goal: Risk for impaired skin integrity will decrease Outcome: Progressing

## 2020-01-23 NOTE — H&P (Addendum)
History and Physical  [place] HUD:[id number] DOB: Jul 12, 1945 DOA: 01/23/2020  PCP: Wannetta Sender, FNP  Patient coming from: Home   I have personally briefly reviewed patient's old medical records in [place]  Chief Complaint: chest pain   HPI: Richard [name] is a 74 y.o. male with medical history significant for bladder cancer, chronic hematuria, followed closely by Dr. Alyson Ingles with urology, coronary artery disease status post stent placement, hyperlipidemia, type 2 diabetes mellitus, chronic congestive heart failure, hypertension, atrial flutter who has been dealing with difficult to control hematuria for the past several weeks which is gotten worse.  He saw Dr. Alyson Ingles yesterday and had a cystoscopy in the office but unfortunately there was too much blood present for therapeutics.  Arrangements have been made for him to have a cystoscopy done under anesthesia tomorrow.  The patient fortunately did not have severe hematuria.  Foley catheter was placed by Dr. Alyson Ingles in the office yesterday.  The patient has been experiencing multiple blood clots and requires frequent flushing of the Foley catheter.  Patient unfortunately developed chest pain symptoms consistent with his history of angina coronary artery disease and describes the pain is centralized chest pain that radiated into the jaw and left shoulder and arm.  His symptoms were relieved by taking nitroglycerin sublingual tablets that he had available at home.  Patient reports that the chest pain was severe and it reminded him of similar times when he required a catheterization and stent placement in the past.  He did not take an aspirin at home but was given an aspirin in the emergency department today.  He is currently chest pain-free.  ED Course: Patient arrived appearing ill and pale.  His vital signs were stable.  Review of Systems: As per HPI otherwise 10 point review of systems negative.  His  initial high-sensitivity troponin was 29.  His sodium was 133, glucose 254, creatinine 2.03, BUN 46, GFR 34, WBC 8.4, hemoglobin 7.1, platelets 300.  His hemoglobin had dropped to 7.1 from 11.7 on November 24.  His SARS 2 coronavirus test was negative.  His influenza a and B tests were negative negative.  Cardiology was consulted and [name] recommended admission to [facility] due to complexity and not able to have anesthesia care at this facility but said to avoid starting patient on heparin due to active bleed.  Dr. Alyson Ingles consulted from urology and said that patient would not stable enough for general anesthesia at this hospital and will need cardiac clearance.      Past Medical History:  Diagnosis Date  . Bladder cancer ([place])   . CAD (coronary artery disease)    a. 2007 - DES x 2 proximal to mid RCA b. 09/2017: DES to mid LAD and OM2. Patent stents along RCA.   [name] CHF (congestive heart failure) ([place])   . Essential hypertension   . History of atrial flutter    Status post ablation 2008 - Dr. Caryl Comes  . History of transient ischemic attack (TIA)   . Mixed hyperlipidemia    Statin intolerance  . Pneumonia   . Type 2 diabetes mellitus (HCC)    A1C 11.5 06/2017    Past Surgical History:  Procedure Laterality Date  . BALLOON ANGIOPLASTY, ARTERY    . BICEPS TENDON REPAIR Left   . CARDIAC ELECTROPHYSIOLOGY MAPPING AND ABLATION    . CARPAL TUNNEL RELEASE Bilateral   . cervical neck fusion     x  4  . CORONARY ANGIOPLASTY    . CORONARY STENT INTERVENTION N/A 10/17/2017   Procedure: CORONARY STENT INTERVENTION;  Surgeon: Martinique, Peter M, MD;  Location: [facility];  Service: Cardiovascular;  Laterality: N/A;  . CORONARY STENT PLACEMENT     x 2  . CYSTOSCOPY W/ RETROGRADES Bilateral 10/08/2019   Procedure: CYSTOSCOPY WITH BILATERAL RETROGRADE PYELOGRAM;  Surgeon: Cleon Gustin, MD;  Location: [facility];  Service: Urology;  Laterality: Bilateral;  . CYSTOSCOPY WITH URETHRAL DILATATION   10/08/2019   Procedure: CYSTOSCOPY WITH URETHRAL DILATATION;  Surgeon: Cleon Gustin, MD;  Location: [facility];  Service: Urology;;  . LEFT HEART CATH AND CORONARY ANGIOGRAPHY N/A 10/17/2017   Procedure: LEFT HEART CATH AND CORONARY ANGIOGRAPHY;  Surgeon: Martinique, Peter M, MD;  Location: [facility];  Service: Cardiovascular;  Laterality: N/A;  . ROTATOR CUFF REPAIR Right    x 4  . TIBIA FRACTURE SURGERY Left   . TRANSURETHRAL RESECTION OF BLADDER TUMOR N/A 10/08/2019   Procedure: TRANSURETHRAL RESECTION OF BLADDER TUMOR (TURBT);  Surgeon: Cleon Gustin, MD;  Location: [facility];  Service: Urology;  Laterality: N/A;     reports that he has been smoking cigarettes. He has a 54.00 pack-year smoking history. He has never used smokeless tobacco. He reports that he does not drink alcohol and does not use drugs.  Allergies  Allergen Reactions  . Statins     Leg pain, tolerates lipitor     Family History  Problem Relation Age of Onset  . Diabetes Mother   . Heart disease Mother   . Heart disease Brother   . Lung cancer Brother   . Emphysema Father   . Lung cancer Brother        x 2  . Lupus Daughter   . Colon cancer Neg Hx   . Esophageal cancer Neg Hx   . Rectal cancer Neg Hx   . Stomach cancer Neg Hx   . Prostate cancer Neg Hx      Prior to Admission medications   Medication Sig Start Date End Date Taking? Authorizing Provider  aspirin EC 81 MG tablet Take 325 mg by mouth daily as needed for mild pain or moderate pain. Swallow whole.    Yes [provider]  Aspirin-Acetaminophen-Caffeine (GOODY HEADACHE PO) Take 1 packet by mouth daily as needed (pain).   Yes [provider]  aspirin-sod bicarb-citric acid (ALKA-SELTZER) 325 MG TBEF tablet Take 650 mg by mouth every 6 (six) hours as needed (indigestion).   Yes [provider]  atorvastatin (LIPITOR) 10 MG tablet Take 10 mg by mouth daily. 06/08/19  Yes [provider]  glipiZIDE  (GLUCOTROL XL) 10 MG 24 hr tablet Take 10 mg by mouth 2 (two) times daily. 07/16/19  Yes [provider]  isosorbide mononitrate (IMDUR) 30 MG 24 hr tablet Take 1 tablet (30 mg total) by mouth at bedtime. 05/04/18  Yes Satira Sark, MD  metFORMIN (GLUCOPHAGE) 1000 MG tablet Take 1,000 mg by mouth 2 (two) times daily with a meal.   Yes [provider]  mirabegron ER (MYRBETRIQ) 25 MG TB24 tablet Take 1 tablet (25 mg total) by mouth daily. 01/22/20  Yes McKenzie, Candee Furbish, MD  nitroGLYCERIN (NITROSTAT) 0.4 MG SL tablet Place 1 tablet (0.4 mg total) under the tongue every 5 (five) minutes as needed for chest pain. 07/09/19  Yes Satira Sark, MD  oxyCODONE-acetaminophen (PERCOCET) 5-325 MG tablet Take 1 tablet by mouth  every 4 (four) hours as needed for moderate pain or severe pain. 01/22/20 01/21/21 Yes McKenzie, Candee Furbish, MD  sacubitril-valsartan (ENTRESTO) 49-51 MG Take 1 tablet by mouth 2 (two) times daily. 01/03/20  Yes Satira Sark, MD  torsemide (DEMADEX) 20 MG tablet Take 20 mg by mouth daily as needed (swelling).  11/19/19  Yes [provider]  amLODipine (NORVASC) 2.5 MG tablet Take 2.5 mg by mouth daily.    [provider]  B-D ULTRAFINE III SHORT PEN 31G X 8 MM MISC Inject into the skin. 09/18/19   [provider]  metoprolol succinate (TOPROL XL) 50 MG 24 hr tablet Take 1.5 tablets (75 mg total) by mouth 2 (two) times daily. Take with or immediately following a meal. Patient not taking: Reported on 01/23/2020 01/01/20   Satira Sark, MD  sulfamethoxazole-trimethoprim (BACTRIM DS) 800-160 MG tablet Take 1 tablet by mouth every 12 (twelve) hours. Patient not taking: Reported on 01/23/2020 01/11/20   Cleon Gustin, MD    Physical Exam: Vitals:   01/23/20 1200 01/23/20 1230 01/23/20 1250 01/23/20 1259  BP: (!) 141/80 140/76 139/79 (!) 143/76  Pulse: 86 75 93 90  Resp: 13 14 16 16   Temp:   98 F (36.7 C) 98.2 F (36.8 C)   TempSrc:   Oral Oral  SpO2: 98% 100% 99% 99%  Weight:      Height:       Constitutional: chronically ill appearing male, NAD, calm, comfortable Eyes: PERRL, lids and conjunctivae normal ENMT: Mucous membranes are moist. Posterior pharynx clear of any exudate or lesions.   Neck: normal, supple, no masses, no thyromegaly Respiratory: clear to auscultation bilaterally, no wheezing, no crackles. Normal respiratory effort. No accessory muscle use.  Cardiovascular: normal s1,s2 sounds, no murmurs / rubs / gallops. No extremity edema. 2+ pedal pulses. No carotid bruits.  Abdomen: no tenderness, no masses palpated. No hepatosplenomegaly. Bowel sounds positive.  Musculoskeletal: no clubbing / cyanosis. No joint deformity upper and lower extremities. Good ROM, no contractures. Normal muscle tone.  Skin: no rashes, lesions, ulcers. No induration Neurologic: CN 2-12 grossly intact. Sensation intact, DTR normal. Strength 5/5 in all 4.  Psychiatric: Normal judgment and insight. Alert and oriented x 3. Normal mood.   Labs on Admission: I have personally reviewed following labs and imaging studies  CBC: Recent Labs  Lab 01/23/20 0943  WBC 8.4  HGB 7.1*  HCT 21.8*  MCV 87.6  PLT 188   Basic Metabolic Panel: Recent Labs  Lab 01/23/20 0943  NA 133*  K 3.9  CL 97*  CO2 26  GLUCOSE 254*  BUN 46*  CREATININE 2.03*  CALCIUM 8.9   GFR: Estimated Creatinine Clearance: 34.8 mL/min (A) (by C-G formula based on SCr of 2.03 mg/dL (H)). Liver Function Tests: No results for input(s): AST, ALT, ALKPHOS, BILITOT, PROT, ALBUMIN in the last 168 hours. No results for input(s): LIPASE, AMYLASE in the last 168 hours. No results for input(s): AMMONIA in the last 168 hours. Coagulation Profile: No results for input(s): INR, PROTIME in the last 168 hours. Cardiac Enzymes: No results for input(s): CKTOTAL, CKMB, CKMBINDEX, TROPONINI in the last 168 hours. BNP (last 3 results) No results for input(s):  PROBNP in the last 8760 hours. HbA1C: No results for input(s): HGBA1C in the last 72 hours. CBG: Recent Labs  Lab 01/23/20 0955  GLUCAP 249*   Lipid Profile: No results for input(s): CHOL, HDL, LDLCALC, TRIG, CHOLHDL, LDLDIRECT in the last 72 hours. Thyroid  Function Tests: No results for input(s): TSH, T4TOTAL, FREET4, T3FREE, THYROIDAB in the last 72 hours. Anemia Panel: No results for input(s): VITAMINB12, FOLATE, FERRITIN, TIBC, IRON, RETICCTPCT in the last 72 hours. Urine analysis:Radiological Exams on Admission: DG Chest Port 1 View  Result Date: 01/23/2020 CLINICAL DATA:  Chest pain EXAM: PORTABLE CHEST 1 VIEW COMPARISON:  11/21/2019 FINDINGS: Artifact from EKG leads. There is no edema, consolidation, effusion, or pneumothorax. Normal heart size and mediastinal contours. IMPRESSION: No evidence of active disease. Electronically Signed   By: Monte Fantasia M.D.   On: 01/23/2020 11:47   EKG: Independently reviewed. 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 Chest pain / unstable angina - Appreciate recommendations from [name].  Hold all heparin at this time due to active bleed.  He was given aspirin in ED.  Continue home imdur, lopressor 2.5 mg every 6 hours with holding parameters, oxygen, dilaudid IV.  I believe his angina was excerbated by acute blood loss anemia and our goal is to keep his Hg at 8 or higher if  possible to avoid recurrent anginal symptoms.  If cardiology clears him for procedures, he will need to have ablation performed by urology to stop the constant bleeding in bladder.  2. Essential hypertension - Holding entresto temporarily as his creatinine is now up to 2.03.  Lopressor 2.5 mg every 6 hours with holding parameters.  Follow.  3. Type 2 diabetes mellitus with vascular complications - holding home oral medications, continue SSI coverage and CBG monitoring as ordered.  4. Acute blood loss anemia - 2 units PRBC being ordered with IV lasix given between units.  He likely will require further PRBC transfusion as he is actively bleeding.  I discussed with his urologist Dr. Alyson Ingles and he says that his recommendation for now is to transfuse as needed.   5. Malignant bladder tumor with gross hematuria - He will need to follow up with Dr. Alyson Ingles in the office to make arrangements for cystoscopy under anesthesia after he has been medically cleared according to my conversation with Dr. Alyson Ingles on 12/1.  6. Hyperlipidemia - resuming home statin.  7. AKI on CKD stage 3b - Holding entresto temporarily.  Follow BMP closely.  Renally dose medications as required.    DVT prophylaxis: SCDs  Code Status: Full   Family Communication: wife at bedside updated   Disposition Plan: admit to [facility]   Consults called: cardiology, urology   Admission status: INP   Irwin Brakeman MD [facility] How to contact the [facility] Attending or Consulting provider [place] or covering provider during after hours [place], for this patient?  1. Check the care team in [facility] and look for a) attending/consulting [facility] provider listed and b) the [facility] team listed 2. Log into [email or web address] and use [place]'s universal password to access. If you do not have the password, please contact the hospital  operator. 3. Locate the [facility] provider you are looking for under [facility] and page to a number that you can  be directly reached. 4. If you still have difficulty reaching the provider, please page the [facility] (Director on Call) for the Hospitalists listed on amion for assistance.   If 7PM-7AM, please contact night-coverage [email or web address] Password [email or web address]  01/23/2020, 1:09 PM

## 2020-01-23 NOTE — Progress Notes (Signed)
New admit to the floor beginning c/o clots clogging up his catheter everytime he coughs and he has a frequent smokers cough in which he coughs all the time. He began c/o pain inability urinate with f/c required writer to irrigate f/c and removed several clots so f/c would drain again. PT reported this is what the tx was in the ED. Patient does not have 3 way f/c in unfortanately. I have paged MD see we can get a 3 way f/c so we can keep f/c flowing freely and patient he will not have so much discomfort. On call cardiology paged alert patient arrival to floor as well as Admitting MD Trilby Drummer for further orders.

## 2020-01-23 NOTE — Plan of Care (Signed)
°  Problem: Education: Goal: Ability to demonstrate management of disease process will improve 01/23/2020 1934 by Arlina Robes, RN Outcome: Progressing 01/23/2020 1933 by Arlina Robes, RN Outcome: Progressing Goal: Ability to verbalize understanding of medication therapies will improve 01/23/2020 1934 by Arlina Robes, RN Outcome: Progressing 01/23/2020 1933 by Arlina Robes, RN Outcome: Progressing Goal: Individualized Educational Video(s) 01/23/2020 1934 by Arlina Robes, RN Outcome: Progressing 01/23/2020 1933 by Arlina Robes, RN Outcome: Progressing   Problem: Activity: Goal: Capacity to carry out activities will improve 01/23/2020 1934 by Arlina Robes, RN Outcome: Progressing 01/23/2020 1933 by Arlina Robes, RN Outcome: Progressing   Problem: Cardiac: Goal: Ability to achieve and maintain adequate cardiopulmonary perfusion will improve 01/23/2020 1934 by Arlina Robes, RN Outcome: Progressing 01/23/2020 1933 by Arlina Robes, RN Outcome: Progressing   Problem: Education: Goal: Understanding of cardiac disease, CV risk reduction, and recovery process will improve Outcome: Progressing Goal: Individualized Educational Video(s) Outcome: Progressing   Problem: Activity: Goal: Ability to tolerate increased activity will improve Outcome: Progressing   Problem: Cardiac: Goal: Ability to achieve and maintain adequate cardiovascular perfusion will improve Outcome: Progressing   Problem: Health Behavior/Discharge Planning: Goal: Ability to safely manage health-related needs after discharge will improve Outcome: Progressing

## 2020-01-23 NOTE — ED Notes (Signed)
Care link at bedside, report to care link, pt denies pain at iv site, rash or itchiness, states that he only has a little jaw pain at this time, states that he is ready for transport, pt from dpt with care link

## 2020-01-23 NOTE — Progress Notes (Signed)
On call Cardiology called back reported would see patient in the am they were just consulting on him only.

## 2020-01-23 NOTE — ED Triage Notes (Signed)
Daughter reports pt has bladder cancer and has a foley cath because he has a lot of blood clots.  Reports foley was placed yesterday.  Pt went for cystoscopy yesterday but was unable to see anything so they scheduled another procedure tomorrow.  Last night pt started having pain between shoulder blades and in bottom jaw.  Reports has taken nitro prn and it relieves the pain.  Reports pain intermittent.

## 2020-01-23 NOTE — Progress Notes (Deleted)
Cardiology Office Note    Date:  01/23/2020   ID:  [name], DOB 05-21-1945, MRN [id number]  PCP:  Wannetta Sender, FNP  Cardiologist: Rozann Lesches, MD    No chief complaint on file.   History of Present Illness:    Richard [name] is a 74 y.o. male with past medical history of CAD (s/p DES to RCA in 2007, DES to mid-LAD and DES to OM2 in 09/2017), atrial flutter (s/p ablation in 2008), HTN, HLD, Type 2 DM, prior TIA, and former tobacco use who presents to the office today for 3-week follow-up.  He was last examined [name] on 12/24/2019 and reported occasional episodes of angina with burning into his chest and mandible which could occur at rest or with activity. He was being followed by Oncology for management of his bladder cancer and had recently undergone TURBT in 09/2019. Oncology notes mentioned considering 5-FU, therefore was recommended to have a repeat echocardiogram given concerns for potential cardiac toxicity.  His repeat echocardiogram showed his EF was reduced at 25 to 30% and grade 2 diastolic dysfunction.  RV function was normal and PASP was mildly elevated.  He was not felt to be a good candidate for a repeat cardiac catheterization given his invasive bladder tumor and risk for recurrent hematuria on DAPT, therefore medical therapy was initially recommended.  Lopressor was discontinued and he was transitioned to Toprol-XL 75 mg twice daily along with Avapro being discontinued and switched to Entresto 49-51 mg twice daily.  He was evaluated in the ED again on 01/15/2020 for recurrent hematuria and did undergo cystoscopy on 01/22/2020 and is scheduled for cystoscopy with fulguration and clot evacuation for 01/24/2020.   Past Medical History:  Diagnosis Date  . Bladder cancer ([place])   . CAD (coronary artery disease)    a. 2007 - DES x 2 proximal to mid RCA b. 09/2017: DES to mid LAD and OM2. Patent stents along RCA.   . Essential hypertension   . History  of atrial flutter    Status post ablation 2008 - Dr. Caryl Comes  . History of transient ischemic attack (TIA)   . Mixed hyperlipidemia    Statin intolerance  . Pneumonia   . Type 2 diabetes mellitus (H[facility])    A1C 11.5 06/2017    Past Surgical History:  Procedure Laterality Date  . BALLOON ANGIOPLASTY, ARTERY    . BICEPS TENDON REPAIR Left   . CARDIAC ELECTROPHYSIOLOGY MAPPING AND ABLATION    . CARPAL TUNNEL RELEASE Bilateral   . cervical neck fusion     x 4  . CORONARY ANGIOPLASTY    . CORONARY STENT INTERVENTION N/A 10/17/2017   Procedure: CORONARY STENT INTERVENTION;  Surgeon: Martinique, Peter M, MD;  Location: [facility];  Service: Cardiovascular;  Laterality: N/A;  . CORONARY STENT PLACEMENT     x 2  . CYSTOSCOPY W/ RETROGRADES Bilateral 10/08/2019   Procedure: CYSTOSCOPY WITH BILATERAL RETROGRADE PYELOGRAM;  Surgeon: Cleon Gustin, MD;  Location: [facility];  Service: Urology;  Laterality: Bilateral;  . CYSTOSCOPY WITH URETHRAL DILATATION  10/08/2019   Procedure: CYSTOSCOPY WITH URETHRAL DILATATION;  Surgeon: Cleon Gustin, MD;  Location: [facility];  Service: Urology;;  . LEFT HEART CATH AND CORONARY ANGIOGRAPHY N/A 10/17/2017   Procedure: LEFT HEART CATH AND CORONARY ANGIOGRAPHY;  Surgeon: Martinique, Peter M, MD;  Location: [facility];  Service: Cardiovascular;  Laterality: N/A;  . ROTATOR CUFF REPAIR Right  x 4  . TIBIA FRACTURE SURGERY Left   . TRANSURETHRAL RESECTION OF BLADDER TUMOR N/A 10/08/2019   Procedure: TRANSURETHRAL RESECTION OF BLADDER TUMOR (TURBT);  Surgeon: Cleon Gustin, MD;  Location: [facility];  Service: Urology;  Laterality: N/A;    Current Medications: Outpatient Medications Prior to Visit  Medication Sig Dispense Refill  . aspirin EC 81 MG tablet Take 325 mg by mouth daily as needed for mild pain or moderate pain. Swallow whole.     . Aspirin-Acetaminophen-Caffeine (GOODY HEADACHE PO) Take 1 packet by mouth daily as needed (pain).    [name]  aspirin-sod bicarb-citric acid (ALKA-SELTZER) 325 MG TBEF tablet Take 650 mg by mouth every 6 (six) hours as needed (indigestion).    [name] atorvastatin (LIPITOR) 10 MG tablet Take 10 mg by mouth daily.    . B-D ULTRAFINE III SHORT PEN 31G X 8 MM MISC Inject into the skin.    [name] glipiZIDE (GLUCOTROL XL) 10 MG 24 hr tablet Take 10 mg by mouth 2 (two) times daily.    . isosorbide mononitrate (IMDUR) 30 MG 24 hr tablet Take 1 tablet (30 mg total) by mouth at bedtime. 30 tablet 6  . metFORMIN (GLUCOPHAGE) 1000 MG tablet Take 1,000 mg by mouth 2 (two) times daily with a meal.    . metoprolol succinate (TOPROL XL) 50 MG 24 hr tablet Take 1.5 tablets (75 mg total) by mouth 2 (two) times daily. Take with or immediately following a meal. 90 tablet 3  . mirabegron ER (MYRBETRIQ) 25 MG TB24 tablet Take 1 tablet (25 mg total) by mouth daily. 30 tablet 0  . nitroGLYCERIN (NITROSTAT) 0.4 MG SL tablet Place 1 tablet (0.4 mg total) under the tongue every 5 (five) minutes as needed for chest pain. 25 tablet 3  . oxyCODONE-acetaminophen (PERCOCET) 5-325 MG tablet Take 1 tablet by mouth every 4 (four) hours as needed for moderate pain or severe pain. 30 tablet 0  . sacubitril-valsartan (ENTRESTO) 49-51 MG Take 1 tablet by mouth 2 (two) times daily. 60 tablet 3  . sulfamethoxazole-trimethoprim (BACTRIM DS) 800-160 MG tablet Take 1 tablet by mouth every 12 (twelve) hours. 14 tablet 0  . torsemide (DEMADEX) 20 MG tablet Take 20 mg by mouth daily as needed (swelling).      No facility-administered medications prior to visit.     Allergies:   Statins   Social History   Socioeconomic History  . Marital status: Married    Spouse name: Not on file  . Number of children: 4  . Years of education: Not on file  . Highest education level: Not on file  Occupational History  . Occupation: truck Geophysicist/field seismologist    Comment: retired  Tobacco Use  . Smoking status: Current Every Day Smoker    Packs/day: 1.00    Years: 54.00    Pack  years: 54.00    Types: Cigarettes  . Smokeless tobacco: Never Used  Vaping Use  . Vaping Use: Never used  Substance and Sexual Activity  . Alcohol use: No    Alcohol/week: 0.0 standard drinks    Comment: rare  . Drug use: No  . Sexual activity: Not Currently  Other Topics Concern  . Not on file  Social History Narrative  . Not on file   Social Determinants of Health   Financial Resource Strain: Low Risk   . Difficulty of Paying Living Expenses: Not hard at all  Food Insecurity: No Food Insecurity  . Worried About [employer] of  Food in the Last Year: Never true  . Ran Out of Food in the Last Year: Never true  Transportation Needs: No Transportation Needs  . Lack of Transportation (Medical): No  . Lack of Transportation (Non-Medical): No  Physical Activity: Inactive  . Days of Exercise per Week: 0 days  . Minutes of Exercise per Session: 0 min  Stress: No Stress Concern Present  . Feeling of Stress : Not at all  Social Connections: Moderately Isolated  . Frequency of Communication with Friends and Family: Three times a week  . Frequency of Social Gatherings with Friends and Family: Three times a week  . Attends Religious Services: Never  . Active Member of Clubs or Organizations: No  . Attends Archivist Meetings: Never  . Marital Status: Married     Family History:  The patient's ***family history includes Diabetes in his mother; Emphysema in his father; Heart disease in his brother and mother; Lung cancer in his brother and brother; Lupus in his daughter.   Review of Systems:   Please see the history of present illness.     General:  No chills, fever, night sweats or weight changes.  Cardiovascular:  No chest pain, dyspnea on exertion, edema, orthopnea, palpitations, paroxysmal nocturnal dyspnea. Dermatological: No rash, lesions/masses Respiratory: No cough, dyspnea Urologic: No hematuria, dysuria Abdominal:   No nausea, vomiting, diarrhea, bright red  blood per rectum, melena, or hematemesis Neurologic:  No visual changes, wkns, changes in mental status. All other systems reviewed and are otherwise negative except as noted above.   Physical Exam:    VS:  There were no vitals taken for this visit.   General: Well developed, well nourished,male appearing in no acute distress. Head: Normocephalic, atraumatic. Neck: No carotid bruits. JVD not elevated.  Lungs: Respirations regular and unlabored, without wheezes or rales.  Heart: ***Regular rate and rhythm. No S3 or S4.  No murmur, no rubs, or gallops appreciated. Abdomen: Appears non-distended. No obvious abdominal masses. Msk:  Strength and tone appear normal for age. No obvious joint deformities or effusions. Extremities: No clubbing or cyanosis. No edema.  Distal pedal pulses are 2+ bilaterally. Neuro: Alert and oriented X 3. Moves all extremities spontaneously. No focal deficits noted. Psych:  Responds to questions appropriately with a normal affect. Skin: No rashes or lesions noted  Wt Readings from Last 3 Encounters:  01/22/20 170 lb (77.1 kg)  01/15/20 170 lb (77.1 kg)  01/11/20 170 lb (77.1 kg)        Studies/Labs Reviewed:   EKG:  EKG is*** ordered today.  The ekg ordered today demonstrates ***  Recent Labs: 09/17/2019: Magnesium 1.5 11/06/2019: ALT 19; BUN 28; Creatinine, Ser 1.26; Potassium 3.9; Sodium 138 01/15/2020: Hemoglobin 11.7; Platelets 223   Lipid Panel    Component Value Date/Time   CHOL 157 09/16/2019 0614   TRIG 121 09/16/2019 0614   HDL 39 (L) 09/16/2019 0614   CHOLHDL 4.0 09/16/2019 0614   VLDL 24 09/16/2019 0614   LDLCALC 94 09/16/2019 0614    Additional studies/ records that were reviewed today include:   Cardiac Catheterization: 09/2017  Prox RCA lesion is 15% stenosed.  Mid RCA lesion is 30% stenosed.  Mid LM lesion is 25% stenosed.  Mid LAD lesion is 85% stenosed.  A drug-eluting stent was successfully placed using a STENT  SYNERGY DES 2.75X16.  Post intervention, there is a 0% residual stenosis.  Ost 2nd Mrg lesion is 50% stenosed.  Mid Cx lesion is 45%  stenosed.  3rd Mrg lesion is 85% stenosed.  A drug-eluting stent was successfully placed using a STENT SYNERGY DES 3X24.  Post intervention, there is a 0% residual stenosis.  LV end diastolic pressure is normal.   1. Severe 2 vessel obstructive CAD- patient has diffuse CAD    - 85% mid LAD    - 85% large OM2 2. Patent stents in RCA.  3. Normal LVEDP 4. Successful PCI of the mid LAD with DES 5. Successful PCI of the OM2  Plan: aggressive risk factor modification. Need to stress compliance with medical therapy. DAPT for one year. Since he has no documented atrial arrhythmia since his ablation I would not resume DOAC. Anticipate DC in am  Recommend uninterrupted dual antiplatelet therapy with Aspirin 81mg  daily and Ticagrelor 90mg  twice daily for a minimum of 12 months (ACS - Class I recommendation).   Echocardiogram: 12/2019 IMPRESSIONS    1. Left ventricular ejection fraction, by estimation, is 25 to 30% -  significantly decreased in comparison with the prior study in July 2021.  Significantly reduced global longitudinal strain of -12.5%. The left  ventricle has severely decreased function.  The left ventricle demonstrates global hypokinesis with mild regional  variation. There is mild left ventricular hypertrophy. Left ventricular  diastolic parameters are consistent with Grade II diastolic dysfunction  (pseudonormalization).  2. Right ventricular systolic function is normal. The right ventricular  size is normal. There is mildly elevated pulmonary artery systolic  pressure. The estimated right ventricular systolic pressure is 62.7 mmHg.  3. The mitral valve is grossly normal. Mild mitral valve regurgitation.  4. The aortic valve is tricuspid. Aortic valve regurgitation is trivial.  Mild to moderate aortic valve  sclerosis/calcification is present, without  any evidence of aortic stenosis.  5. The inferior vena cava is normal in size with <50% respiratory  variability, suggesting right atrial pressure of 8 mmHg.   Assessment:    No diagnosis found.   Plan:   In order of problems listed above:  1. ***    Shared Decision Making/Informed Consent:   {Are you ordering a CV Procedure (e.g. stress test, cath, DCCV, TEE, etc)?   Press F2        :[id number]}    Medication Adjustments/Labs and Tests Ordered: Current medicines are reviewed at length with the patient today.  Concerns regarding medicines are outlined above.  Medication changes, Labs and Tests ordered today are listed in the Patient Instructions below. There are no Patient Instructions on file for this visit.   Signed, Erma Heritage, PA-C  01/23/2020 7:40 AM    [facility] [address] [place], [place] [address] Phone: [phone] Fax: [phone]

## 2020-01-24 ENCOUNTER — Ambulatory Visit: Payer: Medicare Other | Admitting: Urology

## 2020-01-24 ENCOUNTER — Encounter (HOSPITAL_COMMUNITY): Admission: RE | Payer: Self-pay | Source: Home / Self Care

## 2020-01-24 ENCOUNTER — Inpatient Hospital Stay (HOSPITAL_COMMUNITY): Payer: Medicare Other

## 2020-01-24 ENCOUNTER — Ambulatory Visit (HOSPITAL_COMMUNITY): Admission: RE | Admit: 2020-01-24 | Payer: Medicare Other | Source: Home / Self Care | Admitting: Urology

## 2020-01-24 DIAGNOSIS — I340 Nonrheumatic mitral (valve) insufficiency: Secondary | ICD-10-CM

## 2020-01-24 DIAGNOSIS — R9431 Abnormal electrocardiogram [ECG] [EKG]: Secondary | ICD-10-CM

## 2020-01-24 DIAGNOSIS — I4892 Unspecified atrial flutter: Secondary | ICD-10-CM | POA: Diagnosis not present

## 2020-01-24 DIAGNOSIS — I361 Nonrheumatic tricuspid (valve) insufficiency: Secondary | ICD-10-CM | POA: Diagnosis not present

## 2020-01-24 DIAGNOSIS — R310 Gross hematuria: Secondary | ICD-10-CM

## 2020-01-24 DIAGNOSIS — R079 Chest pain, unspecified: Secondary | ICD-10-CM | POA: Diagnosis not present

## 2020-01-24 LAB — BPAM RBC
Blood Product Expiration Date: 202201092359
Blood Product Expiration Date: 202201102359
ISSUE DATE / TIME: 202112011244
ISSUE DATE / TIME: 202112011556
Unit Type and Rh: 5100
Unit Type and Rh: 5100

## 2020-01-24 LAB — BASIC METABOLIC PANEL
Anion gap: 12 (ref 5–15)
BUN: 42 mg/dL — ABNORMAL HIGH (ref 8–23)
CO2: 28 mmol/L (ref 22–32)
Calcium: 9.3 mg/dL (ref 8.9–10.3)
Chloride: 97 mmol/L — ABNORMAL LOW (ref 98–111)
Creatinine, Ser: 2.1 mg/dL — ABNORMAL HIGH (ref 0.61–1.24)
GFR, Estimated: 32 mL/min — ABNORMAL LOW (ref >60)
Glucose, Bld: 260 mg/dL — ABNORMAL HIGH (ref 70–99)
Potassium: 4.9 mmol/L (ref 3.5–5.1)
Sodium: 137 mmol/L (ref 135–145)

## 2020-01-24 LAB — CBC WITH DIFFERENTIAL/PLATELET
Abs Immature Granulocytes: 0.04 10*3/uL (ref 0.00–0.07)
Basophils Absolute: 0.1 10*3/uL (ref 0.0–0.1)
Basophils Relative: 1 %
Eosinophils Absolute: 0 10*3/uL (ref 0.0–0.5)
Eosinophils Relative: 0 %
HCT: 28.8 % — ABNORMAL LOW (ref 39.0–52.0)
Hemoglobin: 9.2 g/dL — ABNORMAL LOW (ref 13.0–17.0)
Immature Granulocytes: 0 %
Lymphocytes Relative: 12 %
Lymphs Abs: 1.2 10*3/uL (ref 0.7–4.0)
MCH: 27.9 pg (ref 26.0–34.0)
MCHC: 31.9 g/dL (ref 30.0–36.0)
MCV: 87.3 fL (ref 80.0–100.0)
Monocytes Absolute: 0.8 10*3/uL (ref 0.1–1.0)
Monocytes Relative: 8 %
Neutro Abs: 8.1 10*3/uL — ABNORMAL HIGH (ref 1.7–7.7)
Neutrophils Relative %: 79 %
Platelets: 287 10*3/uL (ref 150–400)
RBC: 3.3 MIL/uL — ABNORMAL LOW (ref 4.22–5.81)
RDW: 15 % (ref 11.5–15.5)
WBC: 10.2 10*3/uL (ref 4.0–10.5)
nRBC: 0 % (ref 0.0–0.2)

## 2020-01-24 LAB — TYPE AND SCREEN
ABO/RH(D): O POS
Antibody Screen: NEGATIVE
Unit division: 0
Unit division: 0

## 2020-01-24 LAB — ECHOCARDIOGRAM COMPLETE
Area-P 1/2: 3.42 cm2
Height: 72 in
S' Lateral: 4.6 cm
Weight: 2707.2 oz

## 2020-01-24 LAB — TROPONIN I (HIGH SENSITIVITY): Troponin I (High Sensitivity): 39 ng/L — ABNORMAL HIGH (ref <18)

## 2020-01-24 LAB — GLUCOSE, CAPILLARY
Glucose-Capillary: 113 mg/dL — ABNORMAL HIGH (ref 70–99)
Glucose-Capillary: 194 mg/dL — ABNORMAL HIGH (ref 70–99)
Glucose-Capillary: 253 mg/dL — ABNORMAL HIGH (ref 70–99)
Glucose-Capillary: 241 mg/dL — ABNORMAL HIGH (ref 70–99)
Glucose-Capillary: 171 mg/dL — ABNORMAL HIGH (ref 70–99)

## 2020-01-24 SURGERY — CYSTOSCOPY, WITH BLADDER FULGURATION
Anesthesia: General

## 2020-01-24 MED ORDER — METOPROLOL TARTRATE 50 MG PO TABS
50.0000 mg | ORAL_TABLET | Freq: Two times a day (BID) | ORAL | Status: AC
  Administered 2020-01-24 – 2020-01-27 (×8): 50 mg via ORAL
  Filled 2020-01-24 (×8): qty 1

## 2020-01-24 MED ORDER — INSULIN GLARGINE 100 UNIT/ML ~~LOC~~ SOLN
5.0000 [IU] | Freq: Every day | SUBCUTANEOUS | Status: DC
  Administered 2020-01-24 – 2020-01-25 (×2): 5 [IU] via SUBCUTANEOUS
  Filled 2020-01-24 (×2): qty 0.05

## 2020-01-24 MED ORDER — HYDROCODONE-ACETAMINOPHEN 5-325 MG PO TABS
1.0000 | ORAL_TABLET | Freq: Four times a day (QID) | ORAL | Status: DC | PRN
  Administered 2020-01-24 – 2020-01-27 (×9): 1 via ORAL
  Filled 2020-01-24 (×11): qty 1

## 2020-01-24 MED ORDER — CEFAZOLIN SODIUM-DEXTROSE 2-4 GM/100ML-% IV SOLN
2.0000 g | INTRAVENOUS | Status: AC
  Administered 2020-01-26: 2 g via INTRAVENOUS

## 2020-01-24 MED ORDER — ONDANSETRON HCL 4 MG/2ML IJ SOLN: 4.0000 mg | INTRAMUSCULAR | Status: DC | PRN

## 2020-01-24 MED ORDER — OXYBUTYNIN CHLORIDE 5 MG PO TABS
5.0000 mg | ORAL_TABLET | Freq: Three times a day (TID) | ORAL | Status: DC | PRN
  Administered 2020-01-24 – 2020-01-27 (×4): 5 mg via ORAL
  Filled 2020-01-24 (×5): qty 1

## 2020-01-24 MED ORDER — ONDANSETRON HCL 4 MG/2ML IJ SOLN
4.0000 mg | Freq: Four times a day (QID) | INTRAMUSCULAR | Status: DC | PRN
  Administered 2020-01-24 – 2020-01-25 (×3): 4 mg via INTRAVENOUS
  Filled 2020-01-24 (×3): qty 2

## 2020-01-24 MED ORDER — HYDROMORPHONE HCL 1 MG/ML IJ SOLN
1.0000 mg | INTRAMUSCULAR | Status: DC | PRN
  Administered 2020-01-24 – 2020-01-26 (×13): 1 mg via INTRAVENOUS
  Filled 2020-01-24 (×14): qty 1

## 2020-01-24 NOTE — Plan of Care (Signed)
Problem: Education: Goal: Ability to demonstrate management of disease process will improve 01/24/2020 0932 by Arlina Robes, RN Outcome: Progressing 01/23/2020 1938 by Arlina Robes, RN Outcome: Progressing 01/23/2020 1935 by Arlina Robes, RN Outcome: Progressing 01/23/2020 1934 by Arlina Robes, RN Outcome: Progressing 01/23/2020 1933 by Arlina Robes, RN Outcome: Progressing Goal: Ability to verbalize understanding of medication therapies will improve 01/24/2020 0932 by Arlina Robes, RN Outcome: Progressing 01/23/2020 1938 by Arlina Robes, RN Outcome: Progressing 01/23/2020 1935 by Arlina Robes, RN Outcome: Progressing 01/23/2020 1934 by Arlina Robes, RN Outcome: Progressing 01/23/2020 1933 by Arlina Robes, RN Outcome: Progressing Goal: Individualized Educational Video(s) 01/24/2020 0932 by Arlina Robes, RN Outcome: Progressing 01/23/2020 1938 by Arlina Robes, RN Outcome: Progressing 01/23/2020 1935 by Arlina Robes, RN Outcome: Progressing 01/23/2020 1934 by Arlina Robes, RN Outcome: Progressing 01/23/2020 1933 by Arlina Robes, RN Outcome: Progressing   Problem: Activity: Goal: Capacity to carry out activities will improve 01/24/2020 0932 by Arlina Robes, RN Outcome: Progressing 01/23/2020 1938 by Arlina Robes, RN Outcome: Progressing 01/23/2020 1935 by Arlina Robes, RN Outcome: Progressing 01/23/2020 1934 by Arlina Robes, RN Outcome: Progressing 01/23/2020 1933 by Arlina Robes, RN Outcome: Progressing   Problem: Cardiac: Goal: Ability to achieve and maintain adequate cardiopulmonary perfusion will improve 01/24/2020 0932 by Arlina Robes, RN Outcome: Progressing 01/23/2020 1938 by Arlina Robes, RN Outcome: Progressing 01/23/2020 1935 by Arlina Robes, RN Outcome: Progressing 01/23/2020 1934 by Arlina Robes, RN Outcome: Progressing 01/23/2020  1933 by Arlina Robes, RN Outcome: Progressing   Problem: Education: Goal: Understanding of cardiac disease, CV risk reduction, and recovery process will improve 01/24/2020 0932 by Arlina Robes, RN Outcome: Progressing 01/23/2020 1938 by Arlina Robes, RN Outcome: Progressing 01/23/2020 1935 by Arlina Robes, RN Outcome: Progressing 01/23/2020 1934 by Arlina Robes, RN Outcome: Progressing Goal: Individualized Educational Video(s) 01/24/2020 0932 by Arlina Robes, RN Outcome: Progressing 01/23/2020 1938 by Arlina Robes, RN Outcome: Progressing 01/23/2020 1935 by Arlina Robes, RN Outcome: Progressing 01/23/2020 1934 by Arlina Robes, RN Outcome: Progressing   Problem: Activity: Goal: Ability to tolerate increased activity will improve 01/24/2020 0932 by Arlina Robes, RN Outcome: Progressing 01/23/2020 1938 by Arlina Robes, RN Outcome: Progressing 01/23/2020 1935 by Arlina Robes, RN Outcome: Progressing 01/23/2020 1934 by Arlina Robes, RN Outcome: Progressing   Problem: Cardiac: Goal: Ability to achieve and maintain adequate cardiovascular perfusion will improve 01/24/2020 0932 by Arlina Robes, RN Outcome: Progressing 01/23/2020 1938 by Arlina Robes, RN Outcome: Progressing 01/23/2020 1935 by Arlina Robes, RN Outcome: Progressing 01/23/2020 1934 by Arlina Robes, RN Outcome: Progressing   Problem: Health Behavior/Discharge Planning: Goal: Ability to safely manage health-related needs after discharge will improve 01/24/2020 0932 by Arlina Robes, RN Outcome: Progressing 01/23/2020 1938 by Arlina Robes, RN Outcome: Progressing 01/23/2020 1935 by Arlina Robes, RN Outcome: Progressing 01/23/2020 1934 by Arlina Robes, RN Outcome: Progressing   Problem: Education: Goal: Knowledge of General Education information will improve Description: Including pain rating scale,  medication(s)/side effects and non-pharmacologic comfort measures 01/24/2020 0932 by Arlina Robes, RN Outcome: Progressing 01/23/2020 1938 by Arlina Robes, RN Outcome: Progressing 01/23/2020 1935 by Arlina Robes, RN Outcome: Progressing   Problem: Health Behavior/Discharge Planning: Goal: Ability to manage health-related needs will improve 01/24/2020 0932 by Arlina Robes, RN Outcome: Progressing 01/23/2020 1938  by Arlina Robes, RN Outcome: Progressing 01/23/2020 1935 by Arlina Robes, RN Outcome: Progressing   Problem: Clinical Measurements: Goal: Ability to maintain clinical measurements within normal limits will improve 01/24/2020 0932 by Arlina Robes, RN Outcome: Progressing 01/23/2020 1938 by Arlina Robes, RN Outcome: Progressing 01/23/2020 1935 by Arlina Robes, RN Outcome: Progressing Goal: Will remain free from infection 01/24/2020 0932 by Arlina Robes, RN Outcome: Progressing 01/23/2020 1938 by Arlina Robes, RN Outcome: Progressing 01/23/2020 1935 by Arlina Robes, RN Outcome: Progressing Goal: Diagnostic test results will improve 01/24/2020 0932 by Arlina Robes, RN Outcome: Progressing 01/23/2020 1938 by Arlina Robes, RN Outcome: Progressing 01/23/2020 1935 by Arlina Robes, RN Outcome: Progressing Goal: Respiratory complications will improve 01/24/2020 0932 by Arlina Robes, RN Outcome: Progressing 01/23/2020 1938 by Arlina Robes, RN Outcome: Progressing 01/23/2020 1935 by Arlina Robes, RN Outcome: Progressing Goal: Cardiovascular complication will be avoided 01/24/2020 0932 by Arlina Robes, RN Outcome: Progressing 01/23/2020 1938 by Arlina Robes, RN Outcome: Progressing 01/23/2020 1935 by Arlina Robes, RN Outcome: Progressing   Problem: Activity: Goal: Risk for activity intolerance will decrease 01/24/2020 0932 by Arlina Robes, RN Outcome:  Progressing 01/23/2020 1938 by Arlina Robes, RN Outcome: Progressing 01/23/2020 1935 by Arlina Robes, RN Outcome: Progressing   Problem: Nutrition: Goal: Adequate nutrition will be maintained 01/24/2020 0932 by Arlina Robes, RN Outcome: Progressing 01/23/2020 1938 by Arlina Robes, RN Outcome: Progressing 01/23/2020 1935 by Arlina Robes, RN Outcome: Progressing   Problem: Coping: Goal: Level of anxiety will decrease 01/24/2020 0932 by Arlina Robes, RN Outcome: Progressing 01/23/2020 1938 by Arlina Robes, RN Outcome: Progressing 01/23/2020 1935 by Arlina Robes, RN Outcome: Progressing   Problem: Elimination: Goal: Will not experience complications related to bowel motility 01/24/2020 0932 by Arlina Robes, RN Outcome: Progressing 01/23/2020 1938 by Arlina Robes, RN Outcome: Progressing 01/23/2020 1935 by Arlina Robes, RN Outcome: Progressing Goal: Will not experience complications related to urinary retention 01/24/2020 0932 by Arlina Robes, RN Outcome: Progressing 01/23/2020 1938 by Arlina Robes, RN Outcome: Progressing 01/23/2020 1935 by Arlina Robes, RN Outcome: Progressing   Problem: Pain Managment: Goal: General experience of comfort will improve 01/24/2020 0932 by Arlina Robes, RN Outcome: Progressing 01/23/2020 1938 by Arlina Robes, RN Outcome: Progressing 01/23/2020 1935 by Arlina Robes, RN Outcome: Progressing   Problem: Safety: Goal: Ability to remain free from injury will improve 01/24/2020 0932 by Arlina Robes, RN Outcome: Progressing 01/23/2020 1938 by Arlina Robes, RN Outcome: Progressing 01/23/2020 1935 by Arlina Robes, RN Outcome: Progressing   Problem: Skin Integrity: Goal: Risk for impaired skin integrity will decrease 01/24/2020 0932 by Arlina Robes, RN Outcome: Progressing 01/23/2020 1938 by Arlina Robes, RN Outcome:  Progressing 01/23/2020 1935 by Arlina Robes, RN Outcome: Progressing   Problem: Education: Goal: Knowledge of the prescribed therapeutic regimen will improve 01/24/2020 0932 by Arlina Robes, RN Outcome: Progressing 01/23/2020 1938 by Arlina Robes, RN Outcome: Progressing   Problem: Bowel/Gastric: Goal: Gastrointestinal status for postoperative course will improve 01/24/2020 0932 by Arlina Robes, RN Outcome: Progressing 01/23/2020 1938 by Arlina Robes, RN Outcome: Progressing   Problem: Health Behavior/Discharge Planning: Goal: Identification of resources available to assist in meeting health care needs will improve 01/24/2020 0932 by Arlina Robes, RN Outcome: Progressing 01/23/2020 1938 by Arlina Robes, RN Outcome: Progressing  Problem: Skin Integrity: Goal: Demonstration of wound healing without infection will improve 01/24/2020 0932 by Arlina Robes, RN Outcome: Progressing 01/23/2020 1938 by Arlina Robes, RN Outcome: Progressing   Problem: Urinary Elimination: Goal: Ability to avoid or minimize complications of infection will improve 01/24/2020 0932 by Arlina Robes, RN Outcome: Progressing 01/23/2020 1938 by Arlina Robes, RN Outcome: Progressing

## 2020-01-24 NOTE — Progress Notes (Signed)
Dr.Newsome in talk with patient and his wife regarding tx plan,writer updated him on status of patient.

## 2020-01-24 NOTE — Progress Notes (Signed)
   01/24/20 1935  Assess: MEWS Score  BP (!) 194/86  Pulse Rate (!) 118  Assess: MEWS Score  MEWS Temp 0  MEWS Systolic 0  MEWS Pulse 2  MEWS RR 0  MEWS LOC 0  MEWS Score 2  MEWS Score Color Yellow  Assess: if the MEWS score is Yellow or Red  Were vital signs taken at a resting state? No (pt was in a lot of pain and very squirmish )  Focused Assessment No change from prior assessment  Early Detection of Sepsis Score *See Row Information* Low  MEWS guidelines implemented *See Row Information* No, previously yellow, continue vital signs every 4 hours  Document  Patient Outcome Stabilized after interventions  Progress note created (see row info) Yes

## 2020-01-24 NOTE — Progress Notes (Signed)
PROGRESS NOTE  Richard [name]  DOB: 09-23-1945  PCP: Wannetta Sender, FNP WJX:[id number]  DOA: 01/23/2020  LOS: 1 day   Chief Complaint  Patient presents with  . Jaw Pain    Brief narrative: Richard [name] is a 74 y.o. male with PMH significant for DM2, HTN, HLD, CAD status post stent, CHF, A-flutter, bladder cancer, chronic hematuria followed closely by Dr. Alyson Ingles with urology For the last several weeks, patient was having progressively worsening and difficult to control hematuria. He was seen by urologist Dr. Alyson Ingles in the office on 11/30, had a cystoscopy done.  But unfortunately there was too much blood present for any treatment.  Foley catheter was placed. He was discharged home with arrangements made for him to have a cystoscopy done under anesthesia on 12/2.  The patient has been experiencing multiple blood clots and requires frequent flushing of the Foley catheter.    On the night of 12/3210/1, patient started having chest pain and pain between the shoulder blades radiating to the jaw.  Pain relieved with nitroglycerin because of his history of CAD and stents, family brought him to ED.   In the ED at [facility], he was hemodynamically stable. Labs with glucose elevated to 254, creatinine elevated to 2.03, hemoglobin low at 7.1, troponin slightly elevated at 29. Cardiologist and urologist were called from the ED.  Cardiologist [name] and urologist Dr. Alyson Ingles both recommended admission to [facility] due to complexity of the case.  He was not placed on heparin drip because of active bleeding status.   Subjective: Patient was seen and examined this morning. Pleasant unfortunate male.  Sitting up at the edge of the bed.  Partially controlled pain. Has CBI running.  Blood-tinged urine.  No chest pain. Chart reviewed No fever, heart rate 80s, blood pressure elevated to 152/90 this morning, breathing on room air.  Assessment/Plan: ACS History of CAD -Admitted for chest  pain -EKG from 12/1 reviewed by me, sinus tachycardia at 106 bpm, ST depression in V5 and V6/12 lateral ischemia. -Troponin mildly elevated.   -Patient received aspirin in the ED. I would not repeat it because of ongoing bleeding.  Cardiology consultation obtained.  Patient is not a candidate for any ischemia evaluation at this time. -Home meds include blood pressure, Imdur. Recent Labs    01/23/20 0943 01/23/20 1235 01/24/20 1011  TROPONINIHS 29* 30* 39*   Acute on chronic blood loss anemia Acute on chronic hematuria Malignant bladder tumor -Recent cystoscopy on 11/30, was tentatively planned for repeat cystoscopy today 12/2.  But patient ended up coming to the hospital because of chest pain. -Has three-way catheter on with CBI running. -Hemoglobin was 7.1 on 12/1 morning.  2 units of PRBCs were given.  Repeat hemoglobin this morning better at 9.2.  Continue to monitor. -Transfuse for hemoglobin less than 7. -Continue Myrbetriq. Recent Labs    11/06/19 0933 11/06/19 0933 01/15/20 1740 01/15/20 1740 01/23/20 0943 01/23/20 2024 01/24/20 1011  HGB 13.6  --  11.7*  --  7.1* 8.6* 9.2*  MCV 90.1   < > 86.4   < > 87.6  --  87.3   < > = values in this interval not displayed.   AKI -Baseline creatinine 1.26 from September 2021 -Presented with a creatinine of 2.03. -Keep Entresto, torsemide on hold. -It seems patient also takes aspirin for pain at home.  Avoid any NSAID use. -Continue to monitor creatinine. Recent Labs    09/15/19 2057 09/16/19 2130 09/17/19  4235 11/06/19 0933 01/23/20 0943 01/24/20 1011  BUN 34* 35* 17 28* 46* 42*  CREATININE 2.00* 1.73* 1.23 1.26* 2.03* 2.10*   Chronic systolic CHF Essential hypertension -Last echo from a month ago with EF 25 to 30%. -Repeat echocardiogram. -Home meds include Toprol 75 mg twice daily Imdur 30 mg at bedtime, Entresto 49/51 twice daily, torsemide 20 mg as needed, amlodipine 2.5 mg daily, -Entresto and torsemide on hold  because of AKI. -Resume metoprolol and Imdur. -Continue to monitor blood pressure and heart failure symptoms.  Uncontrolled type 2 diabetes mellitus Hyperglycemia -A1c 9.6 on 12/1. -Home meds include glipizide 10 mg twice daily, Metformin 1000 mg twice daily, -Start Lantus 5 units daily from this morning.  Continue 2 units premeal insulin 3 times daily along with sliding scale. -Keep oral meds on hold.   Recent Labs  Lab 01/23/20 1752 01/23/20 2128 01/24/20 0211 01/24/20 0603 01/24/20 1139  GLUCAP 346* 351* 113* 194* 253*   Hyperlipidemia  -resuming home statin.   Goals of care -Patient has a poor prognosis because of his congestive heart failure precluding any treatment for malignant bladder tumor.  He is so far functional but his quality of life seems to be degrading significantly with persistent hematuria and hospitalization.  Patient is going to discuss with his wife about his CODE STATUS.  Palliative care consulted as well.  Mobility: Encourage ambulation. Code Status:   Code Status: Full Code  Nutritional status: Body mass index is 22.95 kg/m.     Diet Order            Diet NPO time specified  Diet effective midnight           Diet Heart Room service appropriate? Yes; Fluid consistency: Thin  Diet effective now                 DVT prophylaxis: SCD's Start: 01/24/20 0936 SCDs Start: 01/23/20 1455   Antimicrobials:  None Fluid: None Consultants: Cardiology, urology, palliative care Family Communication:  None at bedside  Status is: Inpatient   Remains inpatient appropriate because -continues to have hematuria  Dispo: The patient is from: Home              Anticipated d/c is to: Home              Anticipated d/c date is: 2 to 3 days              Patient currently is not medically stable to d/c.       Infusions:  .  ceFAZolin (ANCEF) IV    . sodium chloride irrigation      Scheduled Meds: . amLODipine  2.5 mg Oral Daily  . atorvastatin  10 mg  Oral Daily  . Chlorhexidine Gluconate Cloth  6 each Topical Daily  . insulin aspart  0-5 Units Subcutaneous QHS  . insulin aspart  0-9 Units Subcutaneous TID WC  . insulin aspart  2 Units Subcutaneous TID WC  . insulin glargine  5 Units Subcutaneous Daily  . isosorbide mononitrate  30 mg Oral QHS  . metoprolol tartrate  50 mg Oral BID  . mirabegron ER  25 mg Oral Daily    Antimicrobials: Anti-infectives (From admission, onward)   Start     Dose/Rate Route Frequency Ordered Stop   01/24/20 0935  ceFAZolin (ANCEF) IVPB 2g/100 mL premix        2 g 200 mL/hr over 30 Minutes Intravenous 30 min pre-op 01/24/20 0935  PRN meds: acetaminophen **OR** acetaminophen, bisacodyl, HYDROcodone-acetaminophen, HYDROmorphone (DILAUDID) injection, nitroGLYCERIN, ondansetron **OR** ondansetron (ZOFRAN) IV, oxybutynin   Objective: Vitals:   01/24/20 0606 01/24/20 0744  BP: (!) 157/79 (!) 152/90  Pulse: 85 85  Resp:  20  Temp:  97.6 F (36.4 C)  SpO2: 100% 100%    Intake/Output Summary (Last 24 hours) at 01/24/2020 1645 Last data filed at 01/24/2020 1600 Gross per 24 hour  Intake 2044 ml  Output 41275 ml  Net -39231 ml   Filed Weights   01/23/20 0935 01/24/20 0121  Weight: 77.1 kg 76.7 kg   Weight change:  Body mass index is 22.95 kg/m.   Physical Exam: General exam: Pleasant, elderly Caucasian male.  Patient has three-way catheter with CBI ongoing. Skin: No rashes, lesions or ulcers. HEENT: Atraumatic, normocephalic, no obvious bleeding Lungs: Clear to auscultation bilaterally CVS: Regular rate and rhythm, no murmur GI/Abd soft, nontender, nondistended, bowel sound present CNS: Alert, awake, oriented to place and person Psychiatry: Depressed look Extremities: Trace bilateral pedal edema,   Data Review: I have personally reviewed the laboratory data and studies available.  Recent Labs  Lab 01/23/20 0943 01/23/20 2024 01/24/20 1011  WBC 8.4  --  10.2  NEUTROABS  --    --  8.1*  HGB 7.1* 8.6* 9.2*  HCT 21.8* 26.4* 28.8*  MCV 87.6  --  87.3  PLT 300  --  287   Recent Labs  Lab 01/23/20 0943 01/24/20 1011  NA 133* 137  K 3.9 4.9  CL 97* 97*  CO2 26 28  GLUCOSE 254* 260*  BUN 46* 42*  CREATININE 2.03* 2.10*  CALCIUM 8.9 9.3    F/u labs ordered  Signed, Terrilee Croak, MD [facility] 01/24/2020

## 2020-01-24 NOTE — Progress Notes (Signed)
Back from Lunch into room check on patient he was yelling and screaming CBI no longer dripping writer attempted irrigate f/c removed small clot the irrigate is not coming back into the bag-notified Dr.Newsome on call urologist he reported attempt flush f/c till get clot out if unable would have to replace 3 way f/c,he reported in office seeing patients unable come see patient at present.

## 2020-01-24 NOTE — Progress Notes (Signed)
  Echocardiogram 2D Echocardiogram has been performed.  Andrienne Havener G Malyk Girouard 01/24/2020, 1:04 PM

## 2020-01-24 NOTE — Progress Notes (Signed)
Unable get 3 way f/c unclotted writer has ordered new 3 way f/c await arrival patient aware. He has been given something for pain and nausea. Charge nurse notified of need for new 3 way f/c to rerieve for me. No further changes noted.

## 2020-01-24 NOTE — Progress Notes (Signed)
Pt c/o flank pain and spasms reported would alert MD writer paged Dr.Newsome for further orders. Wife at bedside.

## 2020-01-24 NOTE — Progress Notes (Signed)
Dr.Newsome called back reported would could by see patient today was backed up in the OR but would see him for his c/o's-wife at bedside informed. New orders noted for ditropan for bladder spasms.

## 2020-01-24 NOTE — Progress Notes (Signed)
New 3 way f/c placed without difficulty. Old f/c tip was clogged with clots on removal of cath.

## 2020-01-24 NOTE — Progress Notes (Signed)
Tele called reported patient HR up to 160 in check on patient reported having bad penile pain requested something for pain was given,his heart rate went down instantly when pain relieved. Hr 89 on tele monitor now. No further changes noted.

## 2020-01-24 NOTE — Progress Notes (Signed)
Pt requiring frequent pain meds for c/o penile pain reports has pain tip of penis, he jumps up out of bed stands at bedside he reports cant lie still in bed standing helps, states can't stand the pain he has been given pain meds and they are effective for controlling the pain. He is alternating vicodin and dilaudid at present. He has c/o nausea this am has taken zofran. He has had popsickle for breakfast refused to eat breakfast tray. He conts with CBI keep his f/c patent and draining, his urine is pink salmon colored now,no clots seen at present as he had on admission.  Wife has called been updated on status of patient.

## 2020-01-24 NOTE — Consult Note (Signed)
Consult note- Consulting MD Dahal Subjective: I have bladder cancer Patient reports having gross hematuria.  Patient has been followed by Dr. Noah Delaine in [facility] with history of muscle invasive bladder cancer.  Patient was not felt to be a cystectomy candidate due to poor cardiac status.  He has had recent gross hematuria and had been scheduled for cystoscopy and fulguration but due to some recent chest pain was sent down to [facility] for further evaluation.  He is continued to have gross hematuria requiring three-way Foley placement and now is on continuous bladder irrigation with normal saline.  Urine currently with minimal clots light pink on moderate rate CBI. .  Objective: Vital signs in last 24 hours: Temp:  [97.6 F (36.4 C)-98.7 F (37.1 C)] 97.6 F (36.4 C) (12/02 0744) Pulse Rate:  [70-102] 85 (12/02 0744) Resp:  [16-20] 20 (12/02 0744) BP: (101-176)/(60-90) 152/90 (12/02 0744) SpO2:  [94 %-100 %] 100 % (12/02 0744) Weight:  [76.7 kg] 76.7 kg (12/02 0121)  Intake/Output from previous day: 12/01 0701 - 12/02 0700 In: 2239 [P.O.:890; I.V.:80; Blood:639] Out: 5916 [Urine:8275] Intake/Output this shift: No intake/output data recorded.  Physical Exam:  General:alert, appears older than stated age and no distress GI: not done Male genitalia: not done   Lab Results: Recent Labs    01/23/20 0943 01/23/20 2024 01/24/20 1011  HGB 7.1* 8.6* 9.2*  HCT 21.8* 26.4* 28.8*   BMET Recent Labs    01/23/20 0943 01/24/20 1011  NA 133* 137  K 3.9 4.9  CL 97* 97*  CO2 26 28  GLUCOSE 254* 260*  BUN 46* 42*  CREATININE 2.03* 2.10*  CALCIUM 8.9 9.3   No results for input(s): LABPT, INR in the last 72 hours. No results for input(s): LABURIN in the last 72 hours. Results for orders placed or performed during the hospital encounter of 01/23/20  Resp Panel by RT-PCR (Flu A&B, Covid) Nasopharyngeal Swab     Status: None   Collection Time: 01/23/20  1:02 PM    Specimen: Nasopharyngeal Swab; Nasopharyngeal(NP) swabs in vial transport medium  Result Value Ref Range Status   SARS Coronavirus 2 by RT PCR NEGATIVE NEGATIVE Final    Comment: (NOTE) SARS-CoV-2 target nucleic acids are NOT DETECTED.  The SARS-CoV-2 RNA is generally detectable in upper respiratory specimens during the acute phase of infection. The lowest concentration of SARS-CoV-2 viral copies this assay can detect is 138 copies/mL. A negative result does not preclude SARS-Cov-2 infection and should not be used as the sole basis for treatment or other patient management decisions. A negative result may occur with  improper specimen collection/handling, submission of specimen other than nasopharyngeal swab, presence of viral mutation(s) within the areas targeted by this assay, and inadequate number of viral copies(<138 copies/mL). A negative result must be combined with clinical observations, patient history, and epidemiological information. The expected result is Negative.  Fact Sheet for Patients:  EntrepreneurPulse.com.au  Fact Sheet for Healthcare Providers:  IncredibleEmployment.be  This test is no t yet approved or cleared by the [place] FDA and  has been authorized for detection and/or diagnosis of SARS-CoV-2 by FDA under an Emergency Use Authorization (EUA). This EUA will remain  in effect (meaning this test can be used) for the duration of the COVID-19 declaration under Section 564(b)(1) of the Act, 21 U.S.C.section 360bbb-3(b)(1), unless the authorization is terminated  or revoked sooner.       Influenza A by PCR NEGATIVE NEGATIVE Final   Influenza B  by PCR NEGATIVE NEGATIVE Final    Comment: (NOTE) The Xpert Xpress SARS-CoV-2/FLU/RSV plus assay is intended as an aid in the diagnosis of influenza from Nasopharyngeal swab specimens and should not be used as a sole basis for treatment. Nasal washings and aspirates are  unacceptable for Xpert Xpress SARS-CoV-2/FLU/RSV testing.  Fact Sheet for Patients: EntrepreneurPulse.com.au  Fact Sheet for Healthcare Providers: IncredibleEmployment.be  This test is not yet approved or cleared by the [place] FDA and has been authorized for detection and/or diagnosis of SARS-CoV-2 by FDA under an Emergency Use Authorization (EUA). This EUA will remain in effect (meaning this test can be used) for the duration of the COVID-19 declaration under Section 564(b)(1) of the Act, 21 U.S.C. section 360bbb-3(b)(1), unless the authorization is terminated or revoked.  Performed at [facility], [address], [place], [place] [address]     Studies/Results: [facility] Chest Port 1 View  Result Date: 01/23/2020 CLINICAL DATA:  Chest pain EXAM: PORTABLE CHEST 1 VIEW COMPARISON:  11/21/2019 FINDINGS: Artifact from EKG leads. There is no edema, consolidation, effusion, or pneumothorax. Normal heart size and mediastinal contours. IMPRESSION: No evidence of active disease. Electronically Signed   By: Monte Fantasia M.D.   On: 01/23/2020 11:47   ECHOCARDIOGRAM COMPLETE  Result Date: 01/24/2020    ECHOCARDIOGRAM REPORT   Patient Name:   Richard [name] Date of Exam: 01/24/2020 Medical Rec #:  [id number]      Height:       72.0 in Accession #:    [id number]     Weight:       169.2 lb Date of Birth:  1946-02-12      BSA:          1.984 m Patient Age:    74 years       BP:           152/90 mmHg Patient Gender: M              HR:           86 bpm. Exam Location:  Inpatient Procedure: 2D Echo, Cardiac Doppler and Color Doppler Indications:    R94.31 Abnormal EKG  History:        Patient has prior history of Echocardiogram examinations, most                 recent 12/26/2019. TIA, Arrythmias:Atrial Flutter,                 Signs/Symptoms:Dyspnea; Risk Factors:Hypertension, Diabetes,                 Dyslipidemia and Former Smoker. Cancer.  Sonographer:    Tiffany  Dance Referring Phys: [id number] [place]  1. Left ventricular ejection fraction, by estimation, is 30 to 35%. The left ventricle has moderately decreased function. The left ventricle demonstrates global hypokinesis. The left ventricular internal cavity size was mildly dilated. Left ventricular diastolic parameters are consistent with Grade I diastolic dysfunction (impaired relaxation). Elevated left atrial pressure.  2. Right ventricular systolic function is normal. The right ventricular size is normal.  3. The mitral valve is normal in structure. Mild mitral valve regurgitation. No evidence of mitral stenosis.  4. The aortic valve is tricuspid. Aortic valve regurgitation is not visualized. Mild aortic valve sclerosis is present, with no evidence of aortic valve stenosis.  5. The inferior vena cava is normal in size with greater than 50% respiratory variability, suggesting right atrial pressure of 3 mmHg. FINDINGS  Left  Ventricle: Left ventricular ejection fraction, by estimation, is 30 to 35%. The left ventricle has moderately decreased function. The left ventricle demonstrates global hypokinesis. The left ventricular internal cavity size was mildly dilated. There is no left ventricular hypertrophy. Left ventricular diastolic parameters are consistent with Grade I diastolic dysfunction (impaired relaxation). Elevated left atrial pressure. Right Ventricle: The right ventricular size is normal. Right ventricular systolic function is normal. Left Atrium: Left atrial size was normal in size. Right Atrium: Right atrial size was normal in size. Pericardium: There is no evidence of pericardial effusion. Mitral Valve: The mitral valve is normal in structure. Mild mitral valve regurgitation. No evidence of mitral valve stenosis. Tricuspid Valve: The tricuspid valve is normal in structure. Tricuspid valve regurgitation is mild . No evidence of tricuspid stenosis. Aortic Valve: The aortic valve is tricuspid.  Aortic valve regurgitation is not visualized. Mild aortic valve sclerosis is present, with no evidence of aortic valve stenosis. Pulmonic Valve: The pulmonic valve was not well visualized. Pulmonic valve regurgitation is not visualized. No evidence of pulmonic stenosis. Aorta: The aortic root is normal in size and structure. Venous: The inferior vena cava is normal in size with greater than 50% respiratory variability, suggesting right atrial pressure of 3 mmHg.  LEFT VENTRICLE PLAX 2D LVIDd:         5.20 cm  Diastology LVIDs:         4.60 cm  LV e' medial:    5.25 cm/s LV PW:         1.20 cm  LV E/e' medial:  18.9 LV IVS:        0.80 cm  LV e' lateral:   5.18 cm/s LVOT diam:     2.20 cm  LV E/e' lateral: 19.1 LV SV:         66 LV SV Index:   33 LVOT Area:     3.80 cm  RIGHT VENTRICLE             IVC RV Basal diam:  3.40 cm     IVC diam: 1.60 cm RV Mid diam:    1.90 cm RV S prime:     13.00 cm/s TAPSE (M-mode): 1.9 cm LEFT ATRIUM             Index       RIGHT ATRIUM           Index LA diam:        3.60 cm 1.81 cm/m  RA Area:     18.70 cm LA Vol (A2C):   86.6 ml 43.64 ml/m RA Volume:   54.70 ml  27.57 ml/m LA Vol (A4C):   44.0 ml 22.17 ml/m LA Biplane Vol: 60.2 ml 30.34 ml/m  AORTIC VALVE LVOT Vmax:   74.00 cm/s LVOT Vmean:  54.100 cm/s LVOT VTI:    0.173 m  AORTA Ao Root diam: 3.30 cm Ao Asc diam:  3.00 cm MITRAL VALVE MV Area (PHT): 3.42 cm    SHUNTS MV Decel Time: 222 msec    Systemic VTI:  0.17 m MV E velocity: 99.10 cm/s  Systemic Diam: 2.20 cm MV A velocity: 87.70 cm/s MV E/A ratio:  1.13 Kirk Ruths MD Electronically signed by Kirk Ruths MD Signature Date/Time: 01/24/2020/2:41:59 PM    Final     Assessment/Plan: Hematuria  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  We will continue CBI with normal saline for now.  We will discuss anesthetic risk with cardiology.  May need  attempted cystoscopy and fulguration under anesthesia with further plan for  further cardiac evaluation.  I discussed plan with wife and patient tonight and they are agreeable.  May also consider radiation therapy consultation for consideration of palliative radiation due to the recurrent hematuria.   LOS: 1 day   Richard [name] 01/24/2020, 7:07 PM

## 2020-01-24 NOTE — Plan of Care (Signed)
  Problem: Skin Integrity: Goal: Risk for impaired skin integrity will decrease Outcome: Completed/Met   Problem: Skin Integrity: Goal: Demonstration of wound healing without infection will improve Outcome: Completed/Met

## 2020-01-24 NOTE — Progress Notes (Signed)
Pt sleeping comfortably when this RN went to bedside to change CBI bags and drain 3-way foley catheter. Pt awaken. HR up to 150s while pt in pain and standing on side of bed.

## 2020-01-24 NOTE — Progress Notes (Signed)
Pt with n/v given zofran notified MD patient not eating well sips on fluids only I have offered jello and popsickles to try keep patient hydrated. Wife aware of new orders.

## 2020-01-24 NOTE — Consult Note (Addendum)
Cardiology Consultation:   Patient ID: Richard [name] MRN: [id number]; DOB: 1945/10/22  Admit date: 01/23/2020 Date of Consult: 01/24/2020  Primary Care Provider: Wannetta Sender, FNP [facility] Cardiologist: Rozann Lesches, MD  [facility] Electrophysiologist:  Dr Caryl Comes   Patient Profile:   Richard [name] is a 74 y.o. male with a hx of CAD and bladder cancer who is being seen today for the evaluation of chest pain at the request of Dr Pietro Cassis.  History of Present Illness:   Richard [name] 74 year old male with a history of coronary disease.  He had an RCA PCI in 2007.  In August 2019 he underwent intervention to the LAD and OM 2, the RCA site was patent.  He has had normal LV function by echocardiogram, his last echo was in May 2019.  He has a history of PAF and had remote radiofrequency ablation by Dr. Caryl Comes.  He has non-insulin-dependent diabetes, history of TIA, and dyslipidemia with statin intolerance.  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  He underwent TUR BT in August 2021.  He is followed by Dr. Alyson Ingles.  EKG in July 2021 showed him to be in normal sinus rhythm.  Over the last several weeks the patient's had problems with gross hematuria.  He had seen Dr. Alyson Ingles in his office on 01/22/2020.  A Foley catheter was placed.  He then developed chest pain at home which was described as severe and similar to his pre-PCI symptoms.  He was admitted through the emergency room at [facility] 01/23/2020 with hematuria and chest pain.  Because of the complex history it was felt he would be better cared for at [facility] and he was transferred.  The patient's troponin has been elevated with a peak of 39.  Because of his active hematuria anticoagulation was not recommended.  When I went to examine the patient today he was writhing in pain.  Apparently his Foley catheter is again clogged with clots.  The nursing staff is trying to irrigate this.  The patient is extremely  uncomfortable but he is denying having any chest pain.  Review of his monitor suggest he is in atrial fibrillation.  EKG in July 2021 showed sinus rhythm.   Past Medical History:  Diagnosis Date  . Bladder cancer ([place])   . CAD (coronary artery disease)    a. 2007 - DES x 2 proximal to mid RCA b. 09/2017: DES to mid LAD and OM2. Patent stents along RCA.   [name] CHF (congestive heart failure) ([place])   . Essential hypertension   . History of atrial flutter    Status post ablation 2008 - Dr. Caryl Comes  . History of transient ischemic attack (TIA)   . Mixed hyperlipidemia    Statin intolerance  . Pneumonia   . Type 2 diabetes mellitus (HCC)    A1C 11.5 06/2017    Past Surgical History:  Procedure Laterality Date  . BALLOON ANGIOPLASTY, ARTERY    . BICEPS TENDON REPAIR Left   . CARDIAC ELECTROPHYSIOLOGY MAPPING AND ABLATION    . CARPAL TUNNEL RELEASE Bilateral   . cervical neck fusion     x 4  . CORONARY ANGIOPLASTY    . CORONARY STENT INTERVENTION N/A 10/17/2017   Procedure: CORONARY STENT INTERVENTION;  Surgeon: Martinique, Peter M, MD;  Location: [facility];  Service: Cardiovascular;  Laterality: N/A;  . CORONARY STENT PLACEMENT     x 2  . CYSTOSCOPY W/ RETROGRADES Bilateral 10/08/2019  Procedure: CYSTOSCOPY WITH BILATERAL RETROGRADE PYELOGRAM;  Surgeon: Cleon Gustin, MD;  Location: [facility];  Service: Urology;  Laterality: Bilateral;  . CYSTOSCOPY WITH URETHRAL DILATATION  10/08/2019   Procedure: CYSTOSCOPY WITH URETHRAL DILATATION;  Surgeon: Cleon Gustin, MD;  Location: [facility];  Service: Urology;;  . LEFT HEART CATH AND CORONARY ANGIOGRAPHY N/A 10/17/2017   Procedure: LEFT HEART CATH AND CORONARY ANGIOGRAPHY;  Surgeon: Martinique, Peter M, MD;  Location: [place] [facility];  Service: Cardiovascular;  Laterality: N/A;  . ROTATOR CUFF REPAIR Right    x 4  . TIBIA FRACTURE SURGERY Left   . TRANSURETHRAL RESECTION OF BLADDER TUMOR N/A 10/08/2019   Procedure: TRANSURETHRAL  RESECTION OF BLADDER TUMOR (TURBT);  Surgeon: Cleon Gustin, MD;  Location: [facility];  Service: Urology;  Laterality: N/A;     Home Medications:  Prior to Admission medications   Medication Sig Start Date End Date Taking? Authorizing Provider  aspirin EC 81 MG tablet Take 325 mg by mouth daily as needed for mild pain or moderate pain. Swallow whole.    Yes [provider]  Aspirin-Acetaminophen-Caffeine (GOODY HEADACHE PO) Take 1 packet by mouth daily as needed (pain).   Yes [provider]  aspirin-sod bicarb-citric acid (ALKA-SELTZER) 325 MG TBEF tablet Take 650 mg by mouth every 6 (six) hours as needed (indigestion).   Yes [provider]  atorvastatin (LIPITOR) 10 MG tablet Take 10 mg by mouth daily. 06/08/19  Yes [provider]  glipiZIDE (GLUCOTROL XL) 10 MG 24 hr tablet Take 10 mg by mouth 2 (two) times daily. 07/16/19  Yes [provider]  isosorbide mononitrate (IMDUR) 30 MG 24 hr tablet Take 1 tablet (30 mg total) by mouth at bedtime. 05/04/18  Yes Satira Sark, MD  metFORMIN (GLUCOPHAGE) 1000 MG tablet Take 1,000 mg by mouth 2 (two) times daily with a meal.   Yes [provider]  mirabegron ER (MYRBETRIQ) 25 MG TB24 tablet Take 1 tablet (25 mg total) by mouth daily. 01/22/20  Yes McKenzie, Candee Furbish, MD  nitroGLYCERIN (NITROSTAT) 0.4 MG SL tablet Place 1 tablet (0.4 mg total) under the tongue every 5 (five) minutes as needed for chest pain. 07/09/19  Yes Satira Sark, MD  oxyCODONE-acetaminophen (PERCOCET) 5-325 MG tablet Take 1 tablet by mouth every 4 (four) hours as needed for moderate pain or severe pain. 01/22/20 01/21/21 Yes McKenzie, Candee Furbish, MD  sacubitril-valsartan (ENTRESTO) 49-51 MG Take 1 tablet by mouth 2 (two) times daily. 01/03/20  Yes Satira Sark, MD  torsemide (DEMADEX) 20 MG tablet Take 20 mg by mouth daily as needed (swelling).  11/19/19  Yes [provider]  amLODipine (NORVASC) 2.5 MG  tablet Take 2.5 mg by mouth daily.    [provider]  B-D ULTRAFINE III SHORT PEN 31G X 8 MM MISC Inject into the skin. 09/18/19   [provider]  metoprolol succinate (TOPROL XL) 50 MG 24 hr tablet Take 1.5 tablets (75 mg total) by mouth 2 (two) times daily. Take with or immediately following a meal. Patient not taking: Reported on 01/23/2020 01/01/20   Satira Sark, MD    Inpatient Medications: Scheduled Meds: . amLODipine  2.5 mg Oral Daily  . atorvastatin  10 mg Oral Daily  . Chlorhexidine Gluconate Cloth  6 each Topical Daily  . insulin aspart  0-5 Units Subcutaneous QHS  . insulin aspart  0-9 Units Subcutaneous TID WC  . insulin aspart  2 Units Subcutaneous  TID WC  . insulin glargine  5 Units Subcutaneous Daily  . isosorbide mononitrate  30 mg Oral QHS  . metoprolol tartrate  2.5 mg Intravenous Q6H  . mirabegron ER  25 mg Oral Daily   Continuous Infusions: .  ceFAZolin (ANCEF) IV    . sodium chloride irrigation     PRN Meds: acetaminophen **OR** acetaminophen, bisacodyl, HYDROcodone-acetaminophen, HYDROmorphone (DILAUDID) injection, nitroGLYCERIN, ondansetron **OR** ondansetron (ZOFRAN) IV  Allergies:    Allergies  Allergen Reactions  . Statins     Leg pain, tolerates lipitor     Social History:   Social History   Socioeconomic History  . Marital status: Married    Spouse name: Not on file  . Number of children: 4  . Years of education: Not on file  . Highest education level: Not on file  Occupational History  . Occupation: truck Geophysicist/field seismologist    Comment: retired  Tobacco Use  . Smoking status: Current Every Day Smoker    Packs/day: 1.00    Years: 54.00    Pack years: 54.00    Types: Cigarettes  . Smokeless tobacco: Never Used  Vaping Use  . Vaping Use: Never used  Substance and Sexual Activity  . Alcohol use: No    Alcohol/week: 0.0 standard drinks    Comment: rare  . Drug use: No  . Sexual activity: Not Currently  Other Topics  Concern  . Not on file  Social History Narrative  . Not on file   Social Determinants of Health   Financial Resource Strain: Low Risk   . Difficulty of Paying Living Expenses: Not hard at all  Food Insecurity: No Food Insecurity  . Worried About Charity fundraiser in the Last Year: Never true  . Ran Out of Food in the Last Year: Never true  Transportation Needs: No Transportation Needs  . Lack of Transportation (Medical): No  . Lack of Transportation (Non-Medical): No  Physical Activity: Inactive  . Days of Exercise per Week: 0 days  . Minutes of Exercise per Session: 0 min  Stress: No Stress Concern Present  . Feeling of Stress : Not at all  Social Connections: Moderately Isolated  . Frequency of Communication with Friends and Family: Three times a week  . Frequency of Social Gatherings with Friends and Family: Three times a week  . Attends Religious Services: Never  . Active Member of Clubs or Organizations: No  . Attends Archivist Meetings: Never  . Marital Status: Married  Human resources officer Violence: Not At Risk  . Fear of Current or Ex-Partner: No  . Emotionally Abused: No  . Physically Abused: No  . Sexually Abused: No    Family History:   Family History  Problem Relation Age of Onset  . Diabetes Mother   . Heart disease Mother   . Heart disease Brother   . Lung cancer Brother   . Emphysema Father   . Lung cancer Brother        x 2  . Lupus Daughter   . Colon cancer Neg Hx   . Esophageal cancer Neg Hx   . Rectal cancer Neg Hx   . Stomach cancer Neg Hx   . Prostate cancer Neg Hx      ROS:  Please see the history of present illness.  All other ROS reviewed and negative.     Physical Exam/Data:   Vitals:   01/24/20 0121 01/24/20 0326 01/24/20 0606 01/24/20 0744  BP:  131/69 [name])  157/79 (!) 152/90  Pulse:  70 85 85  Resp:  18  20  Temp:  98.3 F (36.8 C)  97.6 F (36.4 C)  TempSrc:  Oral  Oral  SpO2:  97% 100% 100%  Weight: 76.7 kg      Height:        Intake/Output Summary (Last 24 hours) at 01/24/2020 1400 Last data filed at 01/24/2020 1311 Gross per 24 hour  Intake 2044 ml  Output 38975 ml  Net -36931 ml   Last 3 Weights 01/24/2020 01/23/2020 01/22/2020  Weight (lbs) 169 lb 3.2 oz 170 lb 170 lb  Weight (kg) 76.749 kg 77.111 kg 77.111 kg     Body mass index is 22.95 kg/m.  General:  Well nourished, well developed, writhing in pain from foley cath obstruction HEENT: normal Neck: no JVD Cardiac:  Irregularly irregular, increased rate Lungs:  clear to auscultation bilaterally, no wheezing, rhonchi or rales  Abd: soft, not distended, foley in place Ext: no edema Musculoskeletal:  No deformities, BUE and BLE strength normal and equal Skin: warm and dry  Neuro:  CNs 2-12 intact, no focal abnormalities noted Psych:  Normal affect   EKG:  The EKG 12/02/2021was personally reviewed and demonstrates:  NSR, frequent PACs Telemetry:  Telemetry was personally reviewed and demonstrates:  AF with VR 150  Relevant CV Studies: Echo 01/24/2020- pending  Laboratory Data:  High Sensitivity Troponin:   Recent Labs  Lab 01/23/20 0943 01/23/20 1235 01/24/20 1011  TROPONINIHS 29* 30* 39*     Chemistry Recent Labs  Lab 01/23/20 0943 01/24/20 1011  [name] 133* 137  K 3.9 4.9  CL 97* 97*  CO2 26 28  GLUCOSE 254* 260*  BUN 46* 42*  CREATININE 2.03* 2.10*  CALCIUM 8.9 9.3  GFRNONAA 34* 32*  ANIONGAP 10 12    No results for input(s): PROT, ALBUMIN, AST, ALT, ALKPHOS, BILITOT in the last 168 hours. Hematology Recent Labs  Lab 01/23/20 0943 01/23/20 2024 01/24/20 1011  WBC 8.4  --  10.2  RBC 2.49*  --  3.30*  HGB 7.1* 8.6* 9.2*  HCT 21.8* 26.4* 28.8*  MCV 87.6  --  87.3  MCH 28.5  --  27.9  MCHC 32.6  --  31.9  RDW 15.5  --  15.0  PLT 300  --  287   BNPNo results for input(s): BNP, PROBNP in the last 168 hours.  DDimer No results for input(s): DDIMER in the last 168 hours.   Radiology/Studies:  DG Chest  Port 1 View  Result Date: 01/23/2020 CLINICAL DATA:  Chest pain EXAM: PORTABLE CHEST 1 VIEW COMPARISON:  11/21/2019 FINDINGS: Artifact from EKG leads. There is no edema, consolidation, effusion, or pneumothorax. Normal heart size and mediastinal contours. IMPRESSION: No evidence of active disease. Electronically Signed   By: Monte Fantasia M.D.   On: 01/23/2020 11:47     Assessment and Plan:   [place]- Admitted with chest pain, HS Troponin 39, currently chest pain free. Unable to anticoagulate secondary to gross hematuria.   CAD- RCA PCI '07 LAD and OM2 PCI Aug 2019  ICM- New LVD by echo 12/26/2019 - EF 25-30%, down from 60-65% 09/16/2019.  See Dr McDowell's note- not a good candidate for cath with hematuria and now AKI.  Medical Rx was recommended.   PAF- Current telemetry looks like AF- new since admission. CHADS VASc=5.  Unable to anticoagulate at this time.   H/O PAF- RFA '08-Dr Caryl Comes  AKI- SCr 2.03, was 1.2 in  Sept  Bladder cancer- S/P TURBT Aug 2021.  Dr Alyson Ingles follows-pt may need cystoscopy and ablation to control bleeding.   DM- Per primary service  H/O TIA  Plan- Home dose of Toprol was 75 mg BID- he is now on IV Lopressor 2.5 mg Q6- will change to Lopressor 50 mg BID.  MD to review- he is a poor candidate for cath.    :[id number]} TIMI Risk Score for Unstable Angina or Non-ST Elevation MI:   The patient's TIMI risk score is 4, which indicates a 20% risk of all cause mortality, new or recurrent myocardial infarction or need for urgent revascularization in the next 14 days.      CHA2DS2-VASc Score =   5 This indicates a  % annual risk of stroke. The patient's score is based upon:   HTN, DM, age, vascualr        For questions or updates, please contact [place] Please consult [email or web address] for contact info under    Signed, Kerin Ransom, PA-C  01/24/2020 2:00 PM   Patient seen and examined and agree with Kerin Ransom, PA-C.  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  Unfortunately, this is a very difficult situation to navigate. The patient currently has ongoing gross hematuria with numerous blood clots in his bladder requiring continuous bladder irrigation and blood transfusions to treat his anemia. This makes him too high risk to anticoagulate as this would lead to worsening of his hematuria. While ideally we would proceed with coronary angiography due to known CAD and interval worsening of his LVEF, the patient is unable to tolerate anticoagulation and DAPT and the initiation of DAPT would delay bladder surgery further if the patient is a candidate/amenable to urologic intervention. As such, no further testing from a CV standpoint is indicated at this time as it would not change management due to the fact that the patient is not a candidate for anticoagulation or DAPT. He is high risk from a CV perspective for undergoing possible urologic interventions. We will continue to optimize him medically as best as we can with BB and statin. ACE/ARB/entresto cannot be added due to AKI. Fortunately, chest pain has resolved with treatment of his anemia and trop is only mildly elevated.  Will continue to follow-up with Urology team to determine next steps in management from their standpoint. If plans for urologic intervention, likely will need to transfer to [facility] to do so. Cardiology will continue to follow and manage him medically. Plan discussed with Dr. Milford Cage, the patient and his wife, Katharine Look  Exam: GEN: Uncomfortable after foley exchange Neck: No JVD Cardiac: Irregularly irregular, no murmur  Respiratory: Clear to auscultation  bilaterally. GI: Suprapubic tenderness MS: No edema; No deformity. Neuro:  Nonfocal  Psych: Normal affect   Plan: -Unable to tolerate DAPT on AC due to ongoing gross hematuria and anemia--not a candidate for cath at this time  -No stress testing indicated as would not change management due to above -Will continue medical optimization as able of underlying CAD/HFrEF with BB and statin  -No ACE/ARB/entresto due to AKI; will add as able -Management of bladder cancer and hematuria per Urology -Patient is high risk from a  CV perspective for invasive procedures and unfortunately has limited options due to ongoing bleeding; will optimize as much as possible as detailed above  Total time of encounter: 45 minutes total time of encounter, including 30 minutes spent in face-to-face patient care on the date of this encounter. This time includes coordination of care and counseling regarding above mentioned problem list. Remainder of non-face-to-face time involved reviewing chart documents/testing relevant to the patient encounter and documentation in the medical record. I have independently reviewed documentation from referring provider.   Gwyndolyn Kaufman, MD [place], MD

## 2020-01-25 ENCOUNTER — Inpatient Hospital Stay (HOSPITAL_COMMUNITY): Payer: Medicare Other

## 2020-01-25 ENCOUNTER — Encounter (HOSPITAL_COMMUNITY): Payer: Self-pay | Admitting: Family Medicine

## 2020-01-25 ENCOUNTER — Ambulatory Visit: Admit: 2020-01-25 | Payer: Medicare Other | Admitting: Radiation Oncology

## 2020-01-25 DIAGNOSIS — J9601 Acute respiratory failure with hypoxia: Secondary | ICD-10-CM | POA: Diagnosis not present

## 2020-01-25 DIAGNOSIS — R079 Chest pain, unspecified: Secondary | ICD-10-CM | POA: Diagnosis not present

## 2020-01-25 LAB — CBC WITH DIFFERENTIAL/PLATELET
Abs Immature Granulocytes: 0.04 10*3/uL (ref 0.00–0.07)
Basophils Absolute: 0.1 10*3/uL (ref 0.0–0.1)
Basophils Relative: 1 %
Eosinophils Absolute: 0 10*3/uL (ref 0.0–0.5)
Eosinophils Relative: 0 %
HCT: 23.6 % — ABNORMAL LOW (ref 39.0–52.0)
Hemoglobin: 7.3 g/dL — ABNORMAL LOW (ref 13.0–17.0)
Immature Granulocytes: 0 %
Lymphocytes Relative: 12 %
Lymphs Abs: 1.1 10*3/uL (ref 0.7–4.0)
MCH: 27.3 pg (ref 26.0–34.0)
MCHC: 30.9 g/dL (ref 30.0–36.0)
MCV: 88.4 fL (ref 80.0–100.0)
Monocytes Absolute: 1 10*3/uL (ref 0.1–1.0)
Monocytes Relative: 11 %
Neutro Abs: 7 10*3/uL (ref 1.7–7.7)
Neutrophils Relative %: 76 %
Platelets: 213 10*3/uL (ref 150–400)
RBC: 2.67 MIL/uL — ABNORMAL LOW (ref 4.22–5.81)
RDW: 15.2 % (ref 11.5–15.5)
WBC: 9.1 10*3/uL (ref 4.0–10.5)
nRBC: 0 % (ref 0.0–0.2)

## 2020-01-25 LAB — COMPREHENSIVE METABOLIC PANEL
ALT: 15 U/L (ref 0–44)
AST: 22 U/L (ref 15–41)
Albumin: 3.2 g/dL — ABNORMAL LOW (ref 3.5–5.0)
Alkaline Phosphatase: 97 U/L (ref 38–126)
Anion gap: 15 (ref 5–15)
BUN: 44 mg/dL — ABNORMAL HIGH (ref 8–23)
CO2: 23 mmol/L (ref 22–32)
Calcium: 9 mg/dL (ref 8.9–10.3)
Chloride: 99 mmol/L (ref 98–111)
Creatinine, Ser: 3.35 mg/dL — ABNORMAL HIGH (ref 0.61–1.24)
GFR, Estimated: 19 mL/min — ABNORMAL LOW (ref >60)
Glucose, Bld: 267 mg/dL — ABNORMAL HIGH (ref 70–99)
Potassium: 5.4 mmol/L — ABNORMAL HIGH (ref 3.5–5.1)
Sodium: 137 mmol/L (ref 135–145)
Total Bilirubin: 1 mg/dL (ref 0.3–1.2)
Total Protein: 5.8 g/dL — ABNORMAL LOW (ref 6.5–8.1)

## 2020-01-25 LAB — CBC
HCT: 25.9 % — ABNORMAL LOW (ref 39.0–52.0)
Hemoglobin: 8.5 g/dL — ABNORMAL LOW (ref 13.0–17.0)
MCH: 29 pg (ref 26.0–34.0)
MCHC: 32.8 g/dL (ref 30.0–36.0)
MCV: 88.4 fL (ref 80.0–100.0)
Platelets: 252 10*3/uL (ref 150–400)
RBC: 2.93 MIL/uL — ABNORMAL LOW (ref 4.22–5.81)
RDW: 15.3 % (ref 11.5–15.5)
WBC: 11.6 10*3/uL — ABNORMAL HIGH (ref 4.0–10.5)
nRBC: 0 % (ref 0.0–0.2)

## 2020-01-25 LAB — GLUCOSE, CAPILLARY
Glucose-Capillary: 253 mg/dL — ABNORMAL HIGH (ref 70–99)
Glucose-Capillary: 282 mg/dL — ABNORMAL HIGH (ref 70–99)
Glucose-Capillary: 180 mg/dL — ABNORMAL HIGH (ref 70–99)
Glucose-Capillary: 169 mg/dL — ABNORMAL HIGH (ref 70–99)
Glucose-Capillary: 118 mg/dL — ABNORMAL HIGH (ref 70–99)

## 2020-01-25 LAB — MAGNESIUM: Magnesium: 1.6 mg/dL — ABNORMAL LOW (ref 1.7–2.4)

## 2020-01-25 LAB — PREPARE RBC (CROSSMATCH)

## 2020-01-25 MED ORDER — MAGNESIUM SULFATE 2 GM/50ML IV SOLN
2.0000 g | Freq: Once | INTRAVENOUS | Status: AC
  Administered 2020-01-25: 2 g via INTRAVENOUS
  Filled 2020-01-25: qty 50

## 2020-01-25 MED ORDER — INSULIN GLARGINE 100 UNIT/ML ~~LOC~~ SOLN
10.0000 [IU] | Freq: Every day | SUBCUTANEOUS | Status: DC
  Filled 2020-01-25 (×3): qty 0.1

## 2020-01-25 MED ORDER — SODIUM CHLORIDE 0.9 % IV SOLN: INTRAVENOUS | Status: DC

## 2020-01-25 MED ORDER — SODIUM CHLORIDE 0.9% IV SOLUTION: Freq: Once | INTRAVENOUS | Status: AC

## 2020-01-25 NOTE — Progress Notes (Signed)
Titrated o2 up to 5 lpm/[place] keep o2 sat greater than 90%. Beeped MD to inform him of this.

## 2020-01-25 NOTE — Progress Notes (Signed)
Results for ZACKARIAH, VANDERPOL (MRN [id number]) as of 01/25/2020 10:37  Ref. Range 01/24/2020 11:39 01/24/2020 18:06 01/24/2020 22:15 01/25/2020 02:19 01/25/2020 05:40  Glucose-Capillary Latest Ref Range: 70 - 99 mg/dL 253 (H) 241 (H) 171 (H) 253 (H) 282 (H)  Noted that blood sugars are greater than 180 mg/dl.  Recommend increasing Lantus dose to 10 units daily (82.4 kg X 0.15 units/kg = 12.36 units) if blood sugars continue to be elevated. Titrate dosages as needed.   Harvel Ricks RN BSN CDE Diabetes Coordinator Pager: [phone]  8am-5pm

## 2020-01-25 NOTE — Progress Notes (Signed)
Irrigated f/c times 3 still getting clots that obstruct catheter requiring irrigation. Catheter draining salmon colored urine.

## 2020-01-25 NOTE — Progress Notes (Signed)
Results for TOBEN, ACUNA (MRN [id number]) as of 01/25/2020 17:56  Ref. Range 01/23/2020 09:43 01/23/2020 20:24 01/24/2020 10:11 01/25/2020 03:11 01/25/2020 14:05  WBC Latest Ref Range: 4.0 - 10.5 K/uL 8.4  10.2 11.6 (H) 9.1  RBC Latest Ref Range: 4.22 - 5.81 MIL/uL 2.49 (L)  3.30 (L) 2.93 (L) 2.67 (L)  Hemoglobin Latest Ref Range: 13.0 - 17.0 g/dL 7.1 (L) 8.6 (L) 9.2 (L) 8.5 (L) 7.3 (L)  HCT Latest Ref Range: 39 - 52 % 21.8 (L) 26.4 (L) 28.8 (L) 25.9 (L) 23.6 (L)  MCV Latest Ref Range: 80.0 - 100.0 fL 87.6  87.3 88.4 88.4  MCH Latest Ref Range: 26.0 - 34.0 pg 28.5  27.9 29.0 27.3  MCHC Latest Ref Range: 30.0 - 36.0 g/dL 32.6  31.9 32.8 30.9  RDW Latest Ref Range: 11.5 - 15.5 % 15.5  15.0 15.3 15.2  Platelets Latest Ref Range: 150 - 400 K/uL 300  287 252 213  nRBC Latest Ref Range: 0.0 - 0.2 % 0.0  0.0 0.0 0.0

## 2020-01-25 NOTE — Consult Note (Signed)
NAME:  Richard [name], MRN:  [id number], DOB:  08-Oct-1945, LOS: 2 ADMISSION DATE:  Richard/02/2019, CONSULTATION DATE: Richard/04/2019 REFERRING MD: [facility], CHIEF COMPLAINT: Hypoxia  Brief History   74 year [name] with bladder cancer requiring high doses of Dilaudid for pain control  History of present illness   Richard [name] is a Richard [name] a pack per day who carries a diagnosis of bladder cancer and has been under the care of urology.  Richard [name] also known coronary artery disease with stents x4 .  Initially presented to outside hospital with chest pain requiring nitroglycerin.  Due to his history of coronary artery disease Richard [name] was transferred to [facility] elevated evaluated by cardiology. Richard [name] continued to have increasing abdominal pain and had a CT of the abdomen Richard/04/2019 and showed multiple blood clots and hydronephrosis.  Events receiving Dilaudid for pain and is having desaturation events when heavily sedated.  The plan is to transfer him to [facility] for further evaluation and treatment by urology.  Richard [name] was seen by radiation oncology Richard/04/2019 is recommended radiation therapy for bladder cancer and musculature invasion.  Due to the complexity of his care and his need for high narcotic needs Richard [name] is being transferred to [facility] which probably should be the intensive care unit for at least 24 hours until his bladder cancer clots have been evacuated and his pain has been decreased.  As far as his desaturations continue use high O2 as needed and careful titration of his Dilaudid and management in the ICU unit.  Past Medical History   Past Medical History:  Diagnosis Date  . Anemia 12/2019  . Bladder cancer ([place])   . CAD (coronary artery disease)    a. 2007 - DES x 2 proximal to mid RCA b. 09/2017: DES to mid LAD and OM2. Patent stents along RCA.   [name] CHF (congestive heart failure) ([place])   . Essential hypertension   . History of atrial flutter    Status post ablation 2008 -  Dr. Caryl Comes  . History of transient ischemic attack (TIA)   . Mixed hyperlipidemia    Statin intolerance  . Pneumonia   . Type 2 diabetes mellitus ([place])    A1C 11.5 06/2017     Significant Hospital Events     Consults:  Pulmonary critical care Cardiology Would suggest nephrology  Procedures:    Significant Diagnostic Tests:  Richard/04/2019 CT of the abdomen pelvis as noted with tumors in the bladder and hydronephrosis.  Micro Data:    Antimicrobials:    Interim history/subjective:    Objective   Blood pressure 131/86, pulse 86, temperature 98.4 F (36.9 C), temperature source Oral, resp. rate 20, height 6' (1.829 m), weight 82.4 kg, SpO2 99 %.        Intake/Output Summary (Last 24 hours) at Richard/04/2019 1201 Last data filed at Richard/04/2019 1155 Gross per 24 hour  Intake 51860 ml  Output 57150 ml  Net -5290 ml   Filed Weights   Richard/01/21 0935 Richard/02/21 0121 Richard/03/21 0510  Weight: 77.1 kg 76.7 kg 82.4 kg    Examination: General: 74 year [name] [name] who is heavily sedated with Dilaudid HENT: No JVD or lymphadenopathy is appreciated Lungs: Decreased breath sounds throughout with poor excursion and periods of near apnea while heavily sedated. Cardiovascular: Heart sounds are regular but distant Abdomen: Distended tender Extremities: 3+ edema lower extreme Neuro: Heavily sedated but arouses and follows commands GU: Triple-lumen Foley in place with bladder irrigation going with hematuria noted  Resolved Hospital Problem list     Assessment & Plan:  New onset of hypoxia most likely multifactorial in setting of bladder cancer requiring high levels of titration of Dilaudid and lifelong [name] Richard [name] was desaturating in deep sleep. O2 as needed to keep O2 saturations greater than 88% Careful titration of Dilaudid Richard [name] should be in intensive care unit due to the plethora of health issues and the constant observation for his hypoxia Careful with volume overload Nebulizers as  needed No role for steroids Portable chest x-ray for completeness Place in intensive care unit at [facility]  Bladder cancer with muscle invasion along with multiple clots and bladder and renal insufficiency worsening. Lab Results  Component Value Date   CREATININE 3.35 (H) Richard/04/2019   CREATININE 2.10 (H) Richard/03/2019   CREATININE 2.03 (H) Richard/02/2019      Being transferred to [facility] where urology can better treat him Seen by radiation oncology Richard/04/2019 for the muscle invasion of his bladder cancer with recommendation to radiation therapy of the pelvis.  Richard [name] can have this fall in the intensive care unit at [facility] long hospital if stable enough Per urology and oncology Would suggest nephrology consult   Coronary artery disease with multiple stents being followed by cardiology Main concern at this time is bladder cancer with clots in the bladder hydronephrosis which is creating a great deal of pain. No chest pain at this time. Cardiology has been following   Diabetes mellitus type 2 CBG (last 3)  Recent Labs    Richard/03/21 0219 Richard/03/21 0540 Richard/03/21 1150  GLUCAP 253* 282* 180*   Sliding scale insulin protocol  Best practice (evaluated daily)   Diet: Heart healthy Pain/Anxiety/Delirium protocol (if indicated): Currently receiving IV Dilaudid VAP protocol (if indicated): Not indicated DVT prophylaxis: On hold due to hematuria GI prophylaxis: In place Glucose control: Scale insulin Mobility: Bedrest last date of multidisciplinary goals of care discussion per primary Family and staff present per primary Summary of discussion primary Follow up goals of care discussion due per primary Code Status: Currently full code at the request of daughter and wife Disposition: Return to [facility] long hospital we will request an ICU bed.  Labs   CBC: Recent Labs  Lab Richard/01/21 0943 Richard/01/21 2024 Richard/02/21 1011 Richard/03/21 0311  WBC 8.4  --  10.2 11.6*  NEUTROABS   --   --  8.1*  --   HGB 7.1* 8.6* 9.2* 8.5*  HCT 21.8* 26.4* 28.8* 25.9*  MCV 87.6  --  87.3 88.4  PLT 300  --  287 865    Basic Metabolic Panel: Recent Labs  Lab Richard/01/21 0943 Richard/02/21 1011 Richard/03/21 0311  NA 133* 137 137  K 3.9 4.9 5.4*  CL 97* 97* 99  CO2 26 28 23   GLUCOSE 254* 260* 267*  BUN 46* 42* 44*  CREATININE 2.03* 2.10* 3.35*  CALCIUM 8.9 9.3 9.0  MG  --   --  1.6*   GFR: Estimated Creatinine Clearance: 21.2 mL/min (A) (by C-G formula based on SCr of 3.35 mg/dL (H)). Recent Labs  Lab Richard/01/21 0943 Richard/02/21 1011 Richard/03/21 0311  WBC 8.4 10.2 11.6*    Liver Function Tests: Recent Labs  Lab Richard/03/21 0311  AST 22  ALT 15  ALKPHOS 97  BILITOT 1.0  PROT 5.8*  ALBUMIN 3.2*   No results for input(s): LIPASE, AMYLASE in the last 168 hours. No results for input(s): AMMONIA in the last 168 hours.  ABG    Component Value  Date/Time   HCO3 24.0 11/Richard/2008 0812   TCO2 26 06/27/2017 1139   ACIDBASEDEF 1.0 11/Richard/2008 0812     Coagulation Profile: No results for input(s): INR, PROTIME in the last 168 hours.  Cardiac Enzymes: No results for input(s): CKTOTAL, CKMB, CKMBINDEX, TROPONINI in the last 168 hours.  HbA1C: Hgb A1c MFr Bld  Date/Time Value Ref Range Status  Richard/02/2019 09:43 AM 9.6 (H) 4.8 - 5.6 % Final    Comment:    (NOTE) Pre diabetes:          5.7%-6.4%  Diabetes:              >6.4%  Glycemic control for   <7.0% adults with diabetes   09/16/2019 06:14 AM 10.6 (H) 4.8 - 5.6 % Final    Comment:    (NOTE) Pre diabetes:          5.7%-6.4%  Diabetes:              >6.4%  Glycemic control for   <7.0% adults with diabetes     CBG: Recent Labs  Lab Richard/02/21 1806 Richard/02/21 2215 Richard/03/21 0219 Richard/03/21 0540 Richard/03/21 1150  GLUCAP 241* 171* 253* 282* 180*    Review of Systems:   na  Past Medical History  Richard [name],  has a past medical history of Anemia (12/2019), Bladder cancer ([place]), CAD (coronary artery disease), CHF (congestive heart  failure) ([place]), Essential hypertension, History of atrial flutter, History of transient ischemic attack (TIA), Mixed hyperlipidemia, Pneumonia, and Type 2 diabetes mellitus ([place]).   Surgical History    Past Surgical History:  Procedure Laterality Date  . BALLOON ANGIOPLASTY, ARTERY    . BICEPS TENDON REPAIR Left   . CARDIAC ELECTROPHYSIOLOGY MAPPING AND ABLATION    . CARPAL TUNNEL RELEASE Bilateral   . cervical neck fusion     x 4  . CORONARY ANGIOPLASTY    . CORONARY STENT INTERVENTION N/A 10/17/2017   Procedure: CORONARY STENT INTERVENTION;  Surgeon: Martinique, Peter M, MD;  Location: [facility];  Service: Cardiovascular;  Laterality: N/A;  . CORONARY STENT PLACEMENT     x 2  . CYSTOSCOPY W/ RETROGRADES Bilateral 10/08/2019   Procedure: CYSTOSCOPY WITH BILATERAL RETROGRADE PYELOGRAM;  Surgeon: Cleon Gustin, MD;  Location: [facility];  Service: Urology;  Laterality: Bilateral;  . CYSTOSCOPY WITH URETHRAL DILATATION  10/08/2019   Procedure: CYSTOSCOPY WITH URETHRAL DILATATION;  Surgeon: Cleon Gustin, MD;  Location: [facility];  Service: Urology;;  . LEFT HEART CATH AND CORONARY ANGIOGRAPHY N/A 10/17/2017   Procedure: LEFT HEART CATH AND CORONARY ANGIOGRAPHY;  Surgeon: Martinique, Peter M, MD;  Location: [facility];  Service: Cardiovascular;  Laterality: N/A;  . ROTATOR CUFF REPAIR Right    x 4  . TIBIA FRACTURE SURGERY Left   . TRANSURETHRAL RESECTION OF BLADDER TUMOR N/A 10/08/2019   Procedure: TRANSURETHRAL RESECTION OF BLADDER TUMOR (TURBT);  Surgeon: Cleon Gustin, MD;  Location: [facility];  Service: Urology;  Laterality: N/A;     Social History   reports that Richard [name] has been smoking cigarettes. Richard [name] has a Richard.00 pack-year smoking history. Richard [name] has never used smokeless tobacco. Richard [name] reports that Richard [name] does not drink alcohol and does not use drugs.   Family History   His family history includes Diabetes in his mother; Emphysema in his father; Heart disease in his brother and  mother; Lung cancer in his brother and brother; Lupus in his daughter. There is no history of Colon cancer, Esophageal  cancer, Rectal cancer, Stomach cancer, or Prostate cancer.   Allergies Allergies  Allergen Reactions  . Statins     Leg pain, tolerates lipitor      Home Medications  Prior to Admission medications   Medication Sig Start Date End Date Taking? Authorizing Provider  aspirin EC Richard MG tablet Take 325 mg by mouth daily as needed for mild pain or moderate pain. Swallow whole.    Yes [provider]  Aspirin-Acetaminophen-Caffeine (GOODY HEADACHE PO) Take 1 packet by mouth daily as needed (pain).   Yes [provider]  aspirin-sod bicarb-citric acid (ALKA-SELTZER) 325 MG TBEF tablet Take 650 mg by mouth every 6 (six) hours as needed (indigestion).   Yes [provider]  atorvastatin (LIPITOR) 10 MG tablet Take 10 mg by mouth daily. 06/08/19  Yes [provider]  glipiZIDE (GLUCOTROL XL) 10 MG 24 hr tablet Take 10 mg by mouth 2 (two) times daily. 07/16/19  Yes [provider]  isosorbide mononitrate (IMDUR) 30 MG 24 hr tablet Take 1 tablet (30 mg total) by mouth at bedtime. 3/Richard/20  Yes Satira Sark, MD  metFORMIN (GLUCOPHAGE) 1000 MG tablet Take 1,000 mg by mouth 2 (two) times daily with a meal.   Yes [provider]  mirabegron ER (MYRBETRIQ) 25 MG TB24 tablet Take 1 tablet (25 mg total) by mouth daily. 01/22/20  Yes McKenzie, Candee Furbish, MD  nitroGLYCERIN (NITROSTAT) 0.4 MG SL tablet Place 1 tablet (0.4 mg total) under the tongue every 5 (five) minutes as needed for chest pain. 07/09/19  Yes Satira Sark, MD  oxyCODONE-acetaminophen (PERCOCET) 5-325 MG tablet Take 1 tablet by mouth every 4 (four) hours as needed for moderate pain or severe pain. 01/22/20 01/21/21 Yes McKenzie, Candee Furbish, MD  sacubitril-valsartan (ENTRESTO) 49-51 MG Take 1 tablet by mouth 2 (two) times daily. 01/03/20  Yes Satira Sark, MD   torsemide (DEMADEX) 20 MG tablet Take 20 mg by mouth daily as needed (swelling).  11/19/19  Yes [provider]  amLODipine (NORVASC) 2.5 MG tablet Take 2.5 mg by mouth daily.    [provider]  B-D ULTRAFINE III SHORT PEN 31G X 8 MM MISC Inject into the skin. 09/18/19   [provider]  metoprolol succinate (TOPROL XL) 50 MG 24 hr tablet Take 1.5 tablets (75 mg total) by mouth 2 (two) times daily. Take with or immediately following a meal. Patient not taking: Reported on Richard/02/2019 01/01/20   Satira Sark, MD     Critical care time: 23 min     Richardson Landry Murl Golladay ACNP Acute Care Nurse Practitioner [place] Please consult Amion Richard/04/2019, Richard:01 PM

## 2020-01-25 NOTE — Progress Notes (Addendum)
PROGRESS NOTE  Richard [name]  DOB: [birth year]  PCP: Wannetta Sender, FNP ZOX:[id number]  DOA: 01/23/2020  LOS: 2 days   Chief Complaint  Patient presents with  . Jaw Pain    Brief narrative: Richard [name] is a 74 y.o. male with PMH significant for DM2, HTN, HLD, CAD status post stent, CHF, A-flutter, bladder cancer, chronic hematuria followed closely by Dr. Alyson Ingles with urology For the last several weeks, patient was having progressively worsening and difficult to control hematuria. He was seen by urologist Dr. Alyson Ingles in the office on 11/30, had a cystoscopy done.  But unfortunately there was too much blood present for any treatment.  Foley catheter was placed. He was discharged home with arrangements made for him to have a cystoscopy done under anesthesia on 12/2.  The patient has been experiencing multiple blood clots and requires frequent flushing of the Foley catheter.    On the night of 12/3210/1, patient started having chest pain and pain between the shoulder blades radiating to the jaw.  Pain relieved with nitroglycerin because of his history of CAD and stents, family brought him to ED.   In the ED at [facility], he was hemodynamically stable. Labs with glucose elevated to 254, creatinine elevated to 2.03, hemoglobin low at 7.1, troponin slightly elevated at 29. Cardiologist and urologist were called from the ED.  Cardiologist [name] and urologist Dr. Alyson Ingles both recommended admission to [facility] due to complexity of the case.  He was not placed on heparin drip because of active bleeding status.   Subjective: Patient was seen and examined this morning. Lying on bed.  Denies any complaint.  Not on supplemental oxygen but his oxygen saturation drops and hence he was started on oxygen by nasal collar. He has ongoing CBI, with pinkish color urine. Creatinine worsened to 3.55 this morning. Patient underwent CT abdomen pelvis this morning.  Noted to have bilateral  hydronephrosis along with large amount of blood clot in the bladder. Patient's wife and daughter were at bedside.  I had a long conversation with them.  We talked about patient's status and further plan.  Assessment/Plan: ACS History of CAD -Admitted for chest pain -EKG from 12/1 reviewed by me, sinus tachycardia at 106 bpm, ST depression in V5 and V6/12 lateral ischemia. -Troponin mildly elevated.   -Cardiology consultation obtained.  Patient is not a candidate for any ischemia evaluation at this time. -Continue medical, Imdur and statin.  Patient is currently chest pain-free. Recent Labs    01/23/20 0943 01/23/20 1235 01/24/20 1011  TROPONINIHS 29* 30* 39*   Acute on chronic blood loss anemia Acute on chronic hematuria Malignant bladder tumor -Recent cystoscopy on 11/30, was tentatively planned for repeat cystoscopy today 12/2.  But patient ended up coming to the hospital because of chest pain. -Has three-way catheter on with CBI running. -Received 2 units of PRBCs on 12/1 for low hemoglobin of 7.1.  Hemoglobin up to 8.5 today.   -Continue Myrbetriq. Recent Labs    01/15/20 1740 01/15/20 1740 01/23/20 0943 01/23/20 0943 01/23/20 2024 01/24/20 1011 01/25/20 0311  HGB 11.7*  --  7.1*  --  8.6* 9.2* 8.5*  MCV 86.4   < > 87.6   < >  --  87.3 88.4   < > = values in this interval not displayed.   AKI -Baseline creatinine 1.26 from September 2021 -Presented with a creatinine of 2.03.  Creatinine trended up to 3.35 today.  This is likely because  of postrenal obstruction from blood clots. -CT abdomen pelvis this morning showed bilateral hydronephrosis with blood clots filling the bladder lumen. -Discussed with urologist Dr. Milford Cage.  Plan is to transfer patient to [facility] long hospital and do cystoscopy and bladder evacuation.  Patient will be monitored in ICU post procedure.  Critical care consult appreciated as well. -Keep Entresto, torsemide on hold. -It seems patient also  takes aspirin for pain at home.  Avoid any NSAID use. -Continue to monitor creatinine. Recent Labs    09/15/19 2057 09/16/19 0614 09/17/19 0754 11/06/19 0933 01/23/20 0943 01/24/20 1011 01/25/20 0311  BUN 34* 35* 17 28* 46* 42* 44*  CREATININE 2.00* 1.73* 1.23 1.26* 2.03* 2.10* 3.35*   Hyperkalemia/hypomagnesemia -Lab this morning with potassium 5.4 and magnesium 1.6.  Hyperkalemia secondary to renal failure.   -IV magnesium sulfate 2 g replacement ordered.  Recheck tomorrow. Recent Labs  Lab 01/23/20 0943 01/24/20 1011 01/25/20 0311  K 3.9 4.9 5.4*  MG  --   --  1.6*   Chronic systolic CHF Essential hypertension -Last echo from a month ago with EF 25 to 30%. -Repeat echocardiogram. -Home meds include Toprol 75 mg twice daily Imdur 30 mg at bedtime, Entresto 49/51 twice daily, torsemide 20 mg as needed, amlodipine 2.5 mg daily, -Entresto and torsemide on hold because of AKI. -Continue metoprolol, amlodipine and Imdur. -Continue to monitor blood pressure and heart failure symptoms.  Acute respiratory failure with hypoxia -Patient is requiring 4 L oxygen by nasal this morning.  With coexisting systolic CHF and fluid requirement for AKI, need to be monitored for respiratory decompensation.  Uncontrolled type 2 diabetes mellitus Hyperglycemia -A1c 9.6 on 12/1. -Home meds include glipizide 10 mg twice daily, Metformin 1000 mg twice daily, -With Lantus 5 units, patient blood sugar is up mostly close to 200.  Increase Lantus to 10 units this morning.  Continue sliding-scale insulin with Accu-Cheks. Recent Labs  Lab 01/24/20 1806 01/24/20 2215 01/25/20 0219 01/25/20 0540 01/25/20 1150  GLUCAP 241* 171* 253* 282* 180*   Hyperlipidemia  -resuming home statin.   Goals of care -Palliative care consulted.  Mobility: Encourage ambulation. Code Status:   Code Status: Full Code  Nutritional status: Body mass index is 24.63 kg/m.     Diet Order            Diet NPO  time specified  Diet effective now                 DVT prophylaxis: SCD's Start: 01/24/20 0936 SCDs Start: 01/23/20 1455   Antimicrobials:  None Fluid: None Consultants: Cardiology, urology, palliative care Family Communication:  Discussed with patient's wife and patient's daughter at bedside.    Status is: Inpatient   Remains inpatient appropriate because -continues to have hematuria, plan for urology procedure  Dispo: The patient is from: Home              Anticipated d/c is to: Home              Anticipated d/c date is: 2 to 3 days              Patient currently is not medically stable to d/c.   Infusions:  . sodium chloride 10 mL/hr at 01/25/20 1251  .  ceFAZolin (ANCEF) IV    . sodium chloride irrigation      Scheduled Meds: . amLODipine  2.5 mg Oral Daily  . atorvastatin  10 mg Oral Daily  . Chlorhexidine Gluconate Cloth  6  each Topical Daily  . insulin aspart  0-5 Units Subcutaneous QHS  . insulin aspart  0-9 Units Subcutaneous TID WC  . insulin aspart  2 Units Subcutaneous TID WC  . [START ON 01/26/2020] insulin glargine  10 Units Subcutaneous Daily  . isosorbide mononitrate  30 mg Oral QHS  . metoprolol tartrate  50 mg Oral BID  . mirabegron ER  25 mg Oral Daily    Antimicrobials: Anti-infectives (From admission, onward)   Start     Dose/Rate Route Frequency Ordered Stop   01/24/20 0935  ceFAZolin (ANCEF) IVPB 2g/100 mL premix        2 g 200 mL/hr over 30 Minutes Intravenous 30 min pre-op 01/24/20 0935        PRN meds: acetaminophen **OR** acetaminophen, bisacodyl, HYDROcodone-acetaminophen, HYDROmorphone (DILAUDID) injection, nitroGLYCERIN, ondansetron (ZOFRAN) IV, oxybutynin   Objective: Vitals:   01/25/20 0754 01/25/20 1107  BP: 112/63 131/86  Pulse: 91 86  Resp: 18 20  Temp: 98.2 F (36.8 C) 98.4 F (36.9 C)  SpO2: 90% 99%    Intake/Output Summary (Last 24 hours) at 01/25/2020 1308 Last data filed at 01/25/2020 1155 Gross per 24 hour   Intake 51860 ml  Output 50650 ml  Net 1210 ml   Filed Weights   01/23/20 0935 01/24/20 0121 01/25/20 0510  Weight: 77.1 kg 76.7 kg 82.4 kg   Weight change: 5.262 kg Body mass index is 24.63 kg/m.   Physical Exam: General exam: Pleasant, elderly Caucasian male.  Patient has three-way catheter with CBI ongoing. Skin: No rashes, lesions or ulcers. HEENT: Atraumatic, normocephalic, no obvious bleeding Lungs: Clear to auscultation bilaterally CVS: Regular rate and rhythm, no murmur GI/Abd soft, nontender, nondistended, bowel sound present CNS: Alert, awake, oriented to place and person Psychiatry: Depressed look Extremities: Trace bilateral pedal edema,   Data Review: I have personally reviewed the laboratory data and studies available.  Recent Labs  Lab 01/23/20 0943 01/23/20 2024 01/24/20 1011 01/25/20 0311  WBC 8.4  --  10.2 11.6*  NEUTROABS  --   --  8.1*  --   HGB 7.1* 8.6* 9.2* 8.5*  HCT 21.8* 26.4* 28.8* 25.9*  MCV 87.6  --  87.3 88.4  PLT 300  --  287 252   Recent Labs  Lab 01/23/20 0943 01/24/20 1011 01/25/20 0311  NA 133* 137 137  K 3.9 4.9 5.4*  CL 97* 97* 99  CO2 26 28 23   GLUCOSE 254* 260* 267*  BUN 46* 42* 44*  CREATININE 2.03* 2.10* 3.35*  CALCIUM 8.9 9.3 9.0  MG  --   --  1.6*    F/u labs ordered  Signed, Terrilee Croak, MD [facility] 01/25/2020

## 2020-01-25 NOTE — Progress Notes (Signed)
HGB down to 7.3 MD ordered 1 unit of PRBC's.

## 2020-01-25 NOTE — Progress Notes (Signed)
  Radiation Oncology         [phone]) [phone] ________________________________  Name: Richard [name] MRN: [id number]  Date: 01/23/2020  DOB: 1945/08/09  Chart Note:  I just spoke with Dr. Milford Cage from urology and I reviewed this patient's most recent findings and wanted to take a minute to document my impression.  He is a 75 yo man with history of muscle invasive bladder cancer who has not received treatment yet, due to medical comorbidities.  He now presents with hematuria.  He may be a good candidate for pelvic radiotherapy to help achieve hemostasis and provide local control.  Would recommend CT abd/pelvis and probably transfer to [facility] for radiation consult, CT sim and start treatment Monday.  ________________________________  Sheral Apley. Tammi Klippel, M.D.

## 2020-01-25 NOTE — Progress Notes (Signed)
Dr.Dahal in talk with family about transferring patient to [facility] for further urologic interventions reported would put in for bed for transfer. At present no beds are available.

## 2020-01-25 NOTE — Progress Notes (Signed)
Daughter arrived updated on status of her father she is asking if patient needs to go to the ICU-I reported would page MD and inquire have him come talk with her.

## 2020-01-25 NOTE — Progress Notes (Signed)
Critical care team MD and NP in see patient and talk with family reported would recommend step down ICU bed for patient at [facility]. Family agreeable to plan states they are satisfied just don't want the hospital drag their feet getting him transferred. Dr.Dahal reported to daughter would do their best get patient transferred promptly as possible.

## 2020-01-25 NOTE — Progress Notes (Signed)
O2 at 4 lpm/Edinburgh applied to patient o2 sat dropping down to 70-74% when patient is in a deep sleep. Awake with vitals o2 is ok but when he is sleeping requires the o2 keep o2 within adequate ranges. Dr.Dahal in conferenced with family to develop tx plan further. Family addressed their concerns with MD at this time he reported would have critical care MD come evaluate patient for further recocommedations.

## 2020-01-25 NOTE — Progress Notes (Signed)
Subjective: CC: Gross Hematuria.  Hx: Richard [name] had a CT today for further evaluation of his gross hematuria with clot retention and worsening Cr.   He was found to have the right stent in good position but there was new left hydronephrosis with left bladder wall thicken.  The foley was in good position but there was significant retained urine with clots.  He was to be transferred to [facility] for further management with cystoscopy, clot evacuation and fulguration along with palliative RTx but he has not been transferred yet because of lack of bed availability.  He has had continued issues with catheter obstruction and difficult CBI management.    ROS:  Review of Systems  Unable to perform ROS: Medical condition    Anti-infectives: Anti-infectives (From admission, onward)   Start     Dose/Rate Route Frequency Ordered Stop   01/24/20 0935  ceFAZolin (ANCEF) IVPB 2g/100 mL premix        2 g 200 mL/hr over 30 Minutes Intravenous 30 min pre-op 01/24/20 0935        Current Facility-Administered Medications  Medication Dose Route Frequency Provider Last Rate Last Admin   0.9 %  sodium chloride infusion   Intravenous Continuous Minor, Grace Bushy, NP 10 mL/hr at 01/25/20 1251 Rate Change at 01/25/20 1251   acetaminophen (TYLENOL) tablet 650 mg  650 mg Oral Q6H PRN Irwin Brakeman L, MD   650 mg at 01/23/20 2223   Or   acetaminophen (TYLENOL) suppository 650 mg  650 mg Rectal Q6H PRN Johnson, Clanford L, MD       amLODipine (NORVASC) tablet 2.5 mg  2.5 mg Oral Daily Johnson, Clanford L, MD   2.5 mg at 01/25/20 0737   atorvastatin (LIPITOR) tablet 10 mg  10 mg Oral Daily Johnson, Clanford L, MD   10 mg at 01/25/20 0735   bisacodyl (DULCOLAX) EC tablet 5 mg  5 mg Oral Daily PRN Johnson, Clanford L, MD       ceFAZolin (ANCEF) IVPB 2g/100 mL premix  2 g Intravenous 30 min Pre-Op McKenzie, Candee Furbish, MD       Chlorhexidine Gluconate Cloth 2 % PADS 6 each  6 each Topical Daily Johnson, Clanford  L, MD   6 each at 01/25/20 0834   HYDROcodone-acetaminophen (NORCO/VICODIN) 5-325 MG per tablet 1 tablet  1 tablet Oral Q6H PRN Zierle-Ghosh, Asia B, DO   1 tablet at 01/24/20 1340   HYDROmorphone (DILAUDID) injection 1 mg  1 mg Intravenous Q2H PRN Dahal, Marlowe Aschoff, MD   1 mg at 01/25/20 1658   insulin aspart (novoLOG) injection 0-5 Units  0-5 Units Subcutaneous QHS Johnson, Clanford L, MD   5 Units at 01/23/20 2201   insulin aspart (novoLOG) injection 0-9 Units  0-9 Units Subcutaneous TID WC Johnson, Clanford L, MD   2 Units at 01/25/20 1752   insulin aspart (novoLOG) injection 2 Units  2 Units Subcutaneous TID WC Johnson, Clanford L, MD   2 Units at 01/25/20 1753   [START ON 01/26/2020] insulin glargine (LANTUS) injection 10 Units  10 Units Subcutaneous Daily Dahal, Binaya, MD       isosorbide mononitrate (IMDUR) 24 hr tablet 30 mg  30 mg Oral QHS Johnson, Clanford L, MD   30 mg at 01/24/20 2356   metoprolol tartrate (LOPRESSOR) tablet 50 mg  50 mg Oral BID Kerin Ransom K, PA-C   50 mg at 01/25/20 0736   mirabegron ER (MYRBETRIQ) tablet 25 mg  25 mg Oral Daily Johnson,  Clanford L, MD   25 mg at 01/24/20 0852   nitroGLYCERIN (NITROSTAT) SL tablet 0.4 mg  0.4 mg Sublingual Q5 min PRN Wynetta Emery, Clanford L, MD       ondansetron (ZOFRAN) injection 4 mg  4 mg Intravenous Q6H PRN Dahal, Marlowe Aschoff, MD   4 mg at 01/25/20 0949   oxybutynin (DITROPAN) tablet 5 mg  5 mg Oral Q8H PRN Remi Haggard, MD   5 mg at 01/25/20 0208   sodium chloride irrigation 0.9 % 3,000 mL  3,000 mL Irrigation Continuous Marcelyn Bruins, MD   3,000 mL at 01/25/20 1754     Objective: Vital signs in last 24 hours: Temp:  [97.7 F (36.5 C)-98.7 F (37.1 C)] 98.3 F (36.8 C) (12/03 1800) Pulse Rate:  [73-118] 73 (12/03 1800) Resp:  [16-20] 16 (12/03 1800) BP: (98-194)/(63-92) 124/77 (12/03 1800) SpO2:  [89 %-100 %] 96 % (12/03 1800) Weight:  [82.4 kg] 82.4 kg (12/03 0510)  Intake/Output from previous  day: 12/02 0701 - 12/03 0700 In: 27782 [P.O.:1120] Out: 42353 [Urine:71350] Intake/Output this shift: No intake/output data recorded.   Physical Exam Vitals reviewed.  Constitutional:      Comments: He is arousable but in some pain  Abdominal:     Comments: The lower abdomen is tender with a mass effect suggestive of a full bladder.    Genitourinary:    Comments: There is a foley in place with a large volume of clear to light pink urine in the bag but the CBI is open but not flowing.     Lab Results:  Recent Labs    01/25/20 0311 01/25/20 1405  WBC 11.6* 9.1  HGB 8.5* 7.3*  HCT 25.9* 23.6*  PLT 252 213   BMET Recent Labs    01/24/20 1011 01/25/20 0311  NA 137 137  K 4.9 5.4*  CL 97* 99  CO2 28 23  GLUCOSE 260* 267*  BUN 42* 44*  CREATININE 2.10* 3.35*  CALCIUM 9.3 9.0   PT/INR No results for input(s): LABPROT, INR in the last 72 hours. ABG No results for input(s): PHART, HCO3 in the last 72 hours.  Invalid input(s): PCO2, PO2  Studies/Results: CT ABDOMEN PELVIS WO CONTRAST  Result Date: 01/25/2020 CLINICAL DATA:  Bladder cancer.  Flank pain. EXAM: CT ABDOMEN AND PELVIS WITHOUT CONTRAST TECHNIQUE: Multidetector CT imaging of the abdomen and pelvis was performed following the standard protocol without IV contrast. COMPARISON:  September 16, 2019. FINDINGS: Lower chest: No acute abnormality. Hepatobiliary: No focal liver abnormality is seen. No gallstones, gallbladder wall thickening, or biliary dilatation. Pancreas: Unremarkable. No pancreatic ductal dilatation or surrounding inflammatory changes. Spleen: Normal in size without focal abnormality. Adrenals/Urinary Tract: Adrenal glands appear normal. Mild bilateral perinephric stranding is noted. Right ureteral stent is in grossly good position. Mild bilateral hydronephrosis is noted. Foley catheter is noted within distended urinary bladder. Large amount of blood clot is noted within the bladder lumen. Mild wall  thickening is seen involving the left side of the urinary bladder likely reflecting postoperative change. Stomach/Bowel: Stomach is within normal limits. Appendix appears normal. No evidence of bowel wall thickening, distention, or inflammatory changes. Vascular/Lymphatic: Aortic atherosclerosis. No enlarged abdominal or pelvic lymph nodes. Reproductive: Prostate is unremarkable. Other: Mild anasarca is noted. Small fluid-filled left inguinal hernia is noted. Musculoskeletal: No acute or significant osseous findings. IMPRESSION: 1. Right ureteral stent is in grossly good position. Mild bilateral hydronephrosis is noted. 2. Large amount of blood clot is noted within the  bladder lumen. Mild wall thickening is seen involving the left side of the urinary bladder likely reflecting postoperative change. 3. Mild anasarca is noted. 4. Small fluid-filled left inguinal hernia. Aortic Atherosclerosis (ICD10-I70.0). Electronically Signed   By: Marijo Conception M.D.   On: 01/25/2020 09:34   DG Chest Port 1 View  Result Date: 01/25/2020 CLINICAL DATA:  Abnormal respiration, LEFT flank pain EXAM: PORTABLE CHEST 1 VIEW COMPARISON:  Portable exam 1233 hours compared to 01/23/2020 FINDINGS: Upper normal heart size with pulmonary vascular congestion. Mediastinal contours normal. Atherosclerotic calcification aorta. Minimal RIGHT basilar atelectasis. Lungs is otherwise clear. No pulmonary infiltrate, pleural effusion, or pneumothorax. Prior cervical spine fusion. IMPRESSION: Minimal RIGHT basilar atelectasis. Aortic Atherosclerosis (ICD10-I70.0). Electronically Signed   By: Lavonia Dana M.D.   On: 01/25/2020 13:00   ECHOCARDIOGRAM COMPLETE  Result Date: 01/24/2020    ECHOCARDIOGRAM REPORT   Patient Name:   Richard [name] Date of Exam: 01/24/2020 Medical Rec #:  [id number]      Height:       72.0 in Accession #:    [id number]     Weight:       169.2 lb Date of Birth:  12-11-1945      BSA:          1.984 m Patient Age:    74 years        BP:           152/90 mmHg Patient Gender: M              HR:           86 bpm. Exam Location:  Inpatient Procedure: 2D Echo, Cardiac Doppler and Color Doppler Indications:    R94.31 Abnormal EKG  History:        Patient has prior history of Echocardiogram examinations, most                 recent 12/26/2019. TIA, Arrythmias:Atrial Flutter,                 Signs/Symptoms:Dyspnea; Risk Factors:Hypertension, Diabetes,                 Dyslipidemia and Former Smoker. Cancer.  Sonographer:    Tiffany Dance Referring Phys: [id number] [place]  1. Left ventricular ejection fraction, by estimation, is 30 to 35%. The left ventricle has moderately decreased function. The left ventricle demonstrates global hypokinesis. The left ventricular internal cavity size was mildly dilated. Left ventricular diastolic parameters are consistent with Grade I diastolic dysfunction (impaired relaxation). Elevated left atrial pressure.  2. Right ventricular systolic function is normal. The right ventricular size is normal.  3. The mitral valve is normal in structure. Mild mitral valve regurgitation. No evidence of mitral stenosis.  4. The aortic valve is tricuspid. Aortic valve regurgitation is not visualized. Mild aortic valve sclerosis is present, with no evidence of aortic valve stenosis.  5. The inferior vena cava is normal in size with greater than 50% respiratory variability, suggesting right atrial pressure of 3 mmHg. FINDINGS  Left Ventricle: Left ventricular ejection fraction, by estimation, is 30 to 35%. The left ventricle has moderately decreased function. The left ventricle demonstrates global hypokinesis. The left ventricular internal cavity size was mildly dilated. There is no left ventricular hypertrophy. Left ventricular diastolic parameters are consistent with Grade I diastolic dysfunction (impaired relaxation). Elevated left atrial pressure. Right Ventricle: The right ventricular size is normal. Right  ventricular systolic function is normal. Left Atrium: Left atrial  size was normal in size. Right Atrium: Right atrial size was normal in size. Pericardium: There is no evidence of pericardial effusion. Mitral Valve: The mitral valve is normal in structure. Mild mitral valve regurgitation. No evidence of mitral valve stenosis. Tricuspid Valve: The tricuspid valve is normal in structure. Tricuspid valve regurgitation is mild . No evidence of tricuspid stenosis. Aortic Valve: The aortic valve is tricuspid. Aortic valve regurgitation is not visualized. Mild aortic valve sclerosis is present, with no evidence of aortic valve stenosis. Pulmonic Valve: The pulmonic valve was not well visualized. Pulmonic valve regurgitation is not visualized. No evidence of pulmonic stenosis. Aorta: The aortic root is normal in size and structure. Venous: The inferior vena cava is normal in size with greater than 50% respiratory variability, suggesting right atrial pressure of 3 mmHg.  LEFT VENTRICLE PLAX 2D LVIDd:         5.20 cm  Diastology LVIDs:         4.60 cm  LV e' medial:    5.25 cm/s LV PW:         1.20 cm  LV E/e' medial:  18.9 LV IVS:        0.80 cm  LV e' lateral:   5.18 cm/s LVOT diam:     2.20 cm  LV E/e' lateral: 19.1 LV SV:         66 LV SV Index:   33 LVOT Area:     3.80 cm  RIGHT VENTRICLE             IVC RV Basal diam:  3.40 cm     IVC diam: 1.60 cm RV Mid diam:    1.90 cm RV S prime:     13.00 cm/s TAPSE (M-mode): 1.9 cm LEFT ATRIUM             Index       RIGHT ATRIUM           Index LA diam:        3.60 cm 1.81 cm/m  RA Area:     18.70 cm LA Vol (A2C):   86.6 ml 43.64 ml/m RA Volume:   54.70 ml  27.57 ml/m LA Vol (A4C):   44.0 ml 22.17 ml/m LA Biplane Vol: 60.2 ml 30.34 ml/m  AORTIC VALVE LVOT Vmax:   74.00 cm/s LVOT Vmean:  54.100 cm/s LVOT VTI:    0.173 m  AORTA Ao Root diam: 3.30 cm Ao Asc diam:  3.00 cm MITRAL VALVE MV Area (PHT): 3.42 cm    SHUNTS MV Decel Time: 222 msec    Systemic VTI:  0.17 m MV E  velocity: 99.10 cm/s  Systemic Diam: 2.20 cm MV A velocity: 87.70 cm/s MV E/A ratio:  1.13 Kirk Ruths MD Electronically signed by Kirk Ruths MD Signature Date/Time: 01/24/2020/2:41:59 PM    Final    I have reviewed the CT and labs.  I stopped the CBI and irrigated the bladder with 1053ml of NS.  I got back a moderate amount of clot.  The urine remains slightly blood tinged but it was more consistent with old blood and not active bleeding.   I returned the catheter to straight drainage but asked the nurse not to resume CBI but had irrigate prn.   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  His bleeding doesn't appear active at this time but it is unlikely that I irrigated all of the clot out and I think it would  still be advisable to do cystocopy with clot evacuation and fulguration as indicated at [facility]  Progressive AKI with new onset left hydro and a right ureteral stent in good position.   If I am able to get him set up for cystoscopy, I will attempt left ureteral stent insertion but if there is significant neoplasm at the trigone, he may need a left PCNL.       LOS: 2 days    Irine Seal 01/25/[phone] ID: [name], male   DOB: [birth year], 74 y.o.   MRN: [id number]

## 2020-01-25 NOTE — Progress Notes (Signed)
Patient reports: Resting comfortably this AM. Foley required irrigation of some old clot overnight but urine is clear in tubing this AM even on low rate CBI . No apparent active bleeding. Patient still complaining of Left flank pain..  Objective: Vital signs in last 24 hours: Temp:  [97.7 F (36.5 C)-98.2 F (36.8 C)] 98.2 F (36.8 C) (12/03 0754) Pulse Rate:  [91-118] 91 (12/03 0754) Resp:  [16-18] 18 (12/03 0754) BP: (112-194)/(63-92) 112/63 (12/03 0754) SpO2:  [89 %-93 %] 90 % (12/03 0754) Weight:  [82.4 kg] 82.4 kg (12/03 0510)  Intake/Output from previous day: 12/02 0701 - 12/03 0700 In: 94174 [P.O.:1120] Out: 72800 [Urine:72800] Intake/Output this shift: No intake/output data recorded.  Physical Exam:  General:no distress GI: not done Male genitalia: not done   Lab Results: Recent Labs    01/23/20 2024 01/24/20 1011 01/25/20 0311  HGB 8.6* 9.2* 8.5*  HCT 26.4* 28.8* 25.9*   BMET Recent Labs    01/24/20 1011 01/25/20 0311  NA 137 137  K 4.9 5.4*  CL 97* 99  CO2 28 23  GLUCOSE 260* 267*  BUN 42* 44*  CREATININE 2.10* 3.35*  CALCIUM 9.3 9.0   No results for input(s): LABPT, INR in the last 72 hours. No results for input(s): LABURIN in the last 72 hours. Results for orders placed or performed during the hospital encounter of 01/23/20  Resp Panel by RT-PCR (Flu A&B, Covid) Nasopharyngeal Swab     Status: None   Collection Time: 01/23/20  1:02 PM   Specimen: Nasopharyngeal Swab; Nasopharyngeal(NP) swabs in vial transport medium  Result Value Ref Range Status   SARS Coronavirus 2 by RT PCR NEGATIVE NEGATIVE Final    Comment: (NOTE) SARS-CoV-2 target nucleic acids are NOT DETECTED.  The SARS-CoV-2 RNA is generally detectable in upper respiratory specimens during the acute phase of infection. The lowest concentration of SARS-CoV-2 viral copies this assay can detect is 138 copies/mL. A negative result does not preclude SARS-Cov-2 infection and should  not be used as the sole basis for treatment or other patient management decisions. A negative result may occur with  improper specimen collection/handling, submission of specimen other than nasopharyngeal swab, presence of viral mutation(s) within the areas targeted by this assay, and inadequate number of viral copies(<138 copies/mL). A negative result must be combined with clinical observations, patient history, and epidemiological information. The expected result is Negative.  Fact Sheet for Patients:  EntrepreneurPulse.com.au  Fact Sheet for Healthcare Providers:  IncredibleEmployment.be  This test is no t yet approved or cleared by the [place] FDA and  has been authorized for detection and/or diagnosis of SARS-CoV-2 by FDA under an Emergency Use Authorization (EUA). This EUA will remain  in effect (meaning this test can be used) for the duration of the COVID-19 declaration under Section 564(b)(1) of the Act, 21 U.S.C.section 360bbb-3(b)(1), unless the authorization is terminated  or revoked sooner.       Influenza A by PCR NEGATIVE NEGATIVE Final   Influenza B by PCR NEGATIVE NEGATIVE Final    Comment: (NOTE) The Xpert Xpress SARS-CoV-2/FLU/RSV plus assay is intended as an aid in the diagnosis of influenza from Nasopharyngeal swab specimens and should not be used as a sole basis for treatment. Nasal washings and aspirates are unacceptable for Xpert Xpress SARS-CoV-2/FLU/RSV testing.  Fact Sheet for Patients: EntrepreneurPulse.com.au  Fact Sheet for Healthcare Providers: IncredibleEmployment.be  This test is not yet approved or cleared by the [place] FDA and has been authorized for detection  and/or diagnosis of SARS-CoV-2 by FDA under an Emergency Use Authorization (EUA). This EUA will remain in effect (meaning this test can be used) for the duration of the COVID-19 declaration under  Section 564(b)(1) of the Act, 21 U.S.C. section 360bbb-3(b)(1), unless the authorization is terminated or revoked.  Performed at [facility], [address] [place], [place] [address]     Studies/Results: [facility] Chest Port 1 View  Result Date: 01/23/2020 CLINICAL DATA:  Chest pain EXAM: PORTABLE CHEST 1 VIEW COMPARISON:  11/21/2019 FINDINGS: Artifact from EKG leads. There is no edema, consolidation, effusion, or pneumothorax. Normal heart size and mediastinal contours. IMPRESSION: No evidence of active disease. Electronically Signed   By: Monte Fantasia M.D.   On: 01/23/2020 11:47   ECHOCARDIOGRAM COMPLETE  Result Date: 01/24/2020    ECHOCARDIOGRAM REPORT   Patient Name:   Richard [name] Date of Exam: 01/24/2020 Medical Rec #:  [id number]      Height:       72.0 in Accession #:    [id number]     Weight:       169.2 lb Date of Birth:  January 02, 1946      BSA:          1.984 m Patient Age:    74 years       BP:           152/90 mmHg Patient Gender: M              HR:           86 bpm. Exam Location:  Inpatient Procedure: 2D Echo, Cardiac Doppler and Color Doppler Indications:    R94.31 Abnormal EKG  History:        Patient has prior history of Echocardiogram examinations, most                 recent 12/26/2019. TIA, Arrythmias:Atrial Flutter,                 Signs/Symptoms:Dyspnea; Risk Factors:Hypertension, Diabetes,                 Dyslipidemia and Former Smoker. Cancer.  Sonographer:    Tiffany Dance Referring Phys: [id number] [place]  1. Left ventricular ejection fraction, by estimation, is 30 to 35%. The left ventricle has moderately decreased function. The left ventricle demonstrates global hypokinesis. The left ventricular internal cavity size was mildly dilated. Left ventricular diastolic parameters are consistent with Grade I diastolic dysfunction (impaired relaxation). Elevated left atrial pressure.  2. Right ventricular systolic function is normal. The right ventricular size is normal.   3. The mitral valve is normal in structure. Mild mitral valve regurgitation. No evidence of mitral stenosis.  4. The aortic valve is tricuspid. Aortic valve regurgitation is not visualized. Mild aortic valve sclerosis is present, with no evidence of aortic valve stenosis.  5. The inferior vena cava is normal in size with greater than 50% respiratory variability, suggesting right atrial pressure of 3 mmHg. FINDINGS  Left Ventricle: Left ventricular ejection fraction, by estimation, is 30 to 35%. The left ventricle has moderately decreased function. The left ventricle demonstrates global hypokinesis. The left ventricular internal cavity size was mildly dilated. There is no left ventricular hypertrophy. Left ventricular diastolic parameters are consistent with Grade I diastolic dysfunction (impaired relaxation). Elevated left atrial pressure. Right Ventricle: The right ventricular size is normal. Right ventricular systolic function is normal. Left Atrium: Left atrial size was normal in size. Right Atrium: Right atrial size was  normal in size. Pericardium: There is no evidence of pericardial effusion. Mitral Valve: The mitral valve is normal in structure. Mild mitral valve regurgitation. No evidence of mitral valve stenosis. Tricuspid Valve: The tricuspid valve is normal in structure. Tricuspid valve regurgitation is mild . No evidence of tricuspid stenosis. Aortic Valve: The aortic valve is tricuspid. Aortic valve regurgitation is not visualized. Mild aortic valve sclerosis is present, with no evidence of aortic valve stenosis. Pulmonic Valve: The pulmonic valve was not well visualized. Pulmonic valve regurgitation is not visualized. No evidence of pulmonic stenosis. Aorta: The aortic root is normal in size and structure. Venous: The inferior vena cava is normal in size with greater than 50% respiratory variability, suggesting right atrial pressure of 3 mmHg.  LEFT VENTRICLE PLAX 2D LVIDd:         5.20 cm  Diastology  LVIDs:         4.60 cm  LV e' medial:    5.25 cm/s LV PW:         1.20 cm  LV E/e' medial:  18.9 LV IVS:        0.80 cm  LV e' lateral:   5.18 cm/s LVOT diam:     2.20 cm  LV E/e' lateral: 19.1 LV SV:         66 LV SV Index:   33 LVOT Area:     3.80 cm  RIGHT VENTRICLE             IVC RV Basal diam:  3.40 cm     IVC diam: 1.60 cm RV Mid diam:    1.90 cm RV S prime:     13.00 cm/s TAPSE (M-mode): 1.9 cm LEFT ATRIUM             Index       RIGHT ATRIUM           Index LA diam:        3.60 cm 1.81 cm/m  RA Area:     18.70 cm LA Vol (A2C):   86.6 ml 43.64 ml/m RA Volume:   54.70 ml  27.57 ml/m LA Vol (A4C):   44.0 ml 22.17 ml/m LA Biplane Vol: 60.2 ml 30.34 ml/m  AORTIC VALVE LVOT Vmax:   74.00 cm/s LVOT Vmean:  54.100 cm/s LVOT VTI:    0.173 m  AORTA Ao Root diam: 3.30 cm Ao Asc diam:  3.00 cm MITRAL VALVE MV Area (PHT): 3.42 cm    SHUNTS MV Decel Time: 222 msec    Systemic VTI:  0.17 m MV E velocity: 99.10 cm/s  Systemic Diam: 2.20 cm MV A velocity: 87.70 cm/s MV E/A ratio:  1.13 Kirk Ruths MD Electronically signed by Kirk Ruths MD Signature Date/Time: 01/24/2020/2:41:59 PM    Final     Assessment/Plan: Hematuria  Bladder cancer with worsening renal insufficiency and new Left flank pain  Will order CT urogram to evaluate upper urinary tract  Continue CBI at rate to keep urin clear, wean as possible . No need for urgent cysto /fulguration at this point.  Will have RT see to consider palliative RT   LOS: 2 days   Remi Haggard 01/25/2020, 8:09 AM

## 2020-01-25 NOTE — Plan of Care (Signed)
  Problem: Education: Goal: Ability to demonstrate management of disease process will improve Outcome: Progressing Goal: Ability to verbalize understanding of medication therapies will improve Outcome: Progressing Goal: Individualized Educational Video(s) Outcome: Progressing   Problem: Activity: Goal: Capacity to carry out activities will improve Outcome: Progressing   Problem: Cardiac: Goal: Ability to achieve and maintain adequate cardiopulmonary perfusion will improve Outcome: Progressing   Problem: Education: Goal: Understanding of cardiac disease, CV risk reduction, and recovery process will improve Outcome: Progressing Goal: Individualized Educational Video(s) Outcome: Progressing   Problem: Activity: Goal: Ability to tolerate increased activity will improve Outcome: Progressing   Problem: Cardiac: Goal: Ability to achieve and maintain adequate cardiovascular perfusion will improve Outcome: Progressing   Problem: Health Behavior/Discharge Planning: Goal: Ability to safely manage health-related needs after discharge will improve Outcome: Progressing   Problem: Education: Goal: Knowledge of General Education information will improve Description: Including pain rating scale, medication(s)/side effects and non-pharmacologic comfort measures Outcome: Progressing   Problem: Health Behavior/Discharge Planning: Goal: Ability to manage health-related needs will improve Outcome: Progressing   Problem: Clinical Measurements: Goal: Ability to maintain clinical measurements within normal limits will improve Outcome: Progressing Goal: Will remain free from infection Outcome: Progressing Goal: Diagnostic test results will improve Outcome: Progressing Goal: Respiratory complications will improve Outcome: Progressing Goal: Cardiovascular complication will be avoided Outcome: Progressing   Problem: Activity: Goal: Risk for activity intolerance will decrease Outcome:  Progressing   Problem: Nutrition: Goal: Adequate nutrition will be maintained Outcome: Progressing   Problem: Coping: Goal: Level of anxiety will decrease Outcome: Progressing   Problem: Elimination: Goal: Will not experience complications related to bowel motility Outcome: Progressing Goal: Will not experience complications related to urinary retention Outcome: Progressing   Problem: Pain Managment: Goal: General experience of comfort will improve Outcome: Progressing   Problem: Safety: Goal: Ability to remain free from injury will improve Outcome: Progressing   Problem: Education: Goal: Knowledge of the prescribed therapeutic regimen will improve Outcome: Progressing   Problem: Bowel/Gastric: Goal: Gastrointestinal status for postoperative course will improve Outcome: Progressing   Problem: Health Behavior/Discharge Planning: Goal: Identification of resources available to assist in meeting health care needs will improve Outcome: Progressing   Problem: Urinary Elimination: Goal: Ability to avoid or minimize complications of infection will improve Outcome: Progressing

## 2020-01-25 NOTE — Progress Notes (Signed)
K 5.4 this am and Mg 1.6, notified MD, will continue monitor, thanks Arvella Nigh RN.

## 2020-01-25 NOTE — Progress Notes (Signed)
Dr.Dahal reported would come talk with family next hour or so.

## 2020-01-25 NOTE — Progress Notes (Addendum)
Progress Note  Patient Name: Richard [name] Date of Encounter: 01/25/2020  [facility] Cardiologist: Rozann Lesches, MD   Subjective   Family at bedside. Daughter is respiratory therapist here. Wants to everything. Patient looks pale and very soft spoken. Denies chest pain or dyspnea.   Inpatient Medications    Scheduled Meds: . amLODipine  2.5 mg Oral Daily  . atorvastatin  10 mg Oral Daily  . Chlorhexidine Gluconate Cloth  6 each Topical Daily  . insulin aspart  0-5 Units Subcutaneous QHS  . insulin aspart  0-9 Units Subcutaneous TID WC  . insulin aspart  2 Units Subcutaneous TID WC  . insulin glargine  5 Units Subcutaneous Daily  . isosorbide mononitrate  30 mg Oral QHS  . metoprolol tartrate  50 mg Oral BID  . mirabegron ER  25 mg Oral Daily   Continuous Infusions: . sodium chloride 75 mL/hr at 01/25/20 0806  .  ceFAZolin (ANCEF) IV    . sodium chloride irrigation     PRN Meds: acetaminophen **OR** acetaminophen, bisacodyl, HYDROcodone-acetaminophen, HYDROmorphone (DILAUDID) injection, nitroGLYCERIN, ondansetron (ZOFRAN) IV, oxybutynin   Vital Signs    Vitals:   01/24/20 2349 01/25/20 0156 01/25/20 0510 01/25/20 0754  BP: (!) 166/89 (!) 144/91 140/76 112/63  Pulse: (!) 107 (!) 106  91  Resp: 18  18 18   Temp: 97.7 F (36.5 C)   98.2 F (36.8 C)  TempSrc: Oral  Oral Oral  SpO2: (!) 89%  93% 90%  Weight:   82.4 kg   Height:        Intake/Output Summary (Last 24 hours) at 01/25/2020 1034 Last data filed at 01/25/2020 1008 Gross per 24 hour  Intake 58100 ml  Output 59200 ml  Net -1100 ml   Last 3 Weights 01/25/2020 01/24/2020 01/23/2020  Weight (lbs) 181 lb 9.6 oz 169 lb 3.2 oz 170 lb  Weight (kg) 82.373 kg 76.749 kg 77.111 kg      Telemetry    Sinus rhythm with PACs and PVCs - Personally Reviewed  ECG    N/A  Physical Exam   GEN: Pale, ill appearing elderly male in no acute distress.   Neck: No JVD Cardiac: RRR, no murmurs, rubs, or gallops.   Respiratory: Clear to auscultation bilaterally. GI: Soft, nontender, non-distended  MS: Trace edema; No deformity. Neuro:  Nonfocal  Psych: soft spoken   Labs    High Sensitivity Troponin:   Recent Labs  Lab 01/23/20 0943 01/23/20 1235 01/24/20 1011  TROPONINIHS 29* 30* 39*      Chemistry Recent Labs  Lab 01/23/20 0943 01/24/20 1011 01/25/20 0311  NA 133* 137 137  K 3.9 4.9 5.4*  CL 97* 97* 99  CO2 26 28 23   GLUCOSE 254* 260* 267*  BUN 46* 42* 44*  CREATININE 2.03* 2.10* 3.35*  CALCIUM 8.9 9.3 9.0  PROT  --   --  5.8*  ALBUMIN  --   --  3.2*  AST  --   --  22  ALT  --   --  15  ALKPHOS  --   --  97  BILITOT  --   --  1.0  GFRNONAA 34* 32* 19*  ANIONGAP 10 12 15      Hematology Recent Labs  Lab 01/23/20 0943 01/23/20 0943 01/23/20 2024 01/24/20 1011 01/25/20 0311  WBC 8.4  --   --  10.2 11.6*  RBC 2.49*  --   --  3.30* 2.93*  HGB 7.1*   < >  8.6* 9.2* 8.5*  HCT 21.8*   < > 26.4* 28.8* 25.9*  MCV 87.6  --   --  87.3 88.4  MCH 28.5  --   --  27.9 29.0  MCHC 32.6  --   --  31.9 32.8  RDW 15.5  --   --  15.0 15.3  PLT 300  --   --  287 252   < > = values in this interval not displayed.    Radiology    CT ABDOMEN PELVIS WO CONTRAST  Result Date: 01/25/2020 CLINICAL DATA:  Bladder cancer.  Flank pain. EXAM: CT ABDOMEN AND PELVIS WITHOUT CONTRAST TECHNIQUE: Multidetector CT imaging of the abdomen and pelvis was performed following the standard protocol without IV contrast. COMPARISON:  September 16, 2019. FINDINGS: Lower chest: No acute abnormality. Hepatobiliary: No focal liver abnormality is seen. No gallstones, gallbladder wall thickening, or biliary dilatation. Pancreas: Unremarkable. No pancreatic ductal dilatation or surrounding inflammatory changes. Spleen: Normal in size without focal abnormality. Adrenals/Urinary Tract: Adrenal glands appear normal. Mild bilateral perinephric stranding is noted. Right ureteral stent is in grossly good position. Mild  bilateral hydronephrosis is noted. Foley catheter is noted within distended urinary bladder. Large amount of blood clot is noted within the bladder lumen. Mild wall thickening is seen involving the left side of the urinary bladder likely reflecting postoperative change. Stomach/Bowel: Stomach is within normal limits. Appendix appears normal. No evidence of bowel wall thickening, distention, or inflammatory changes. Vascular/Lymphatic: Aortic atherosclerosis. No enlarged abdominal or pelvic lymph nodes. Reproductive: Prostate is unremarkable. Other: Mild anasarca is noted. Small fluid-filled left inguinal hernia is noted. Musculoskeletal: No acute or significant osseous findings. IMPRESSION: 1. Right ureteral stent is in grossly good position. Mild bilateral hydronephrosis is noted. 2. Large amount of blood clot is noted within the bladder lumen. Mild wall thickening is seen involving the left side of the urinary bladder likely reflecting postoperative change. 3. Mild anasarca is noted. 4. Small fluid-filled left inguinal hernia. Aortic Atherosclerosis (ICD10-I70.0). Electronically Signed   By: Marijo Conception M.D.   On: 01/25/2020 09:34   DG Chest Port 1 View  Result Date: 01/23/2020 CLINICAL DATA:  Chest pain EXAM: PORTABLE CHEST 1 VIEW COMPARISON:  11/21/2019 FINDINGS: Artifact from EKG leads. There is no edema, consolidation, effusion, or pneumothorax. Normal heart size and mediastinal contours. IMPRESSION: No evidence of active disease. Electronically Signed   By: Monte Fantasia M.D.   On: 01/23/2020 11:47   ECHOCARDIOGRAM COMPLETE  Result Date: 01/24/2020    ECHOCARDIOGRAM REPORT   Patient Name:   Richard [name] Date of Exam: 01/24/2020 Medical Rec #:  [id number]      Height:       72.0 in Accession #:    [id number]     Weight:       169.2 lb Date of Birth:  27-Mar-1945      BSA:          1.984 m Patient Age:    74 years       BP:           152/90 mmHg Patient Gender: M              HR:           86 bpm.  Exam Location:  Inpatient Procedure: 2D Echo, Cardiac Doppler and Color Doppler Indications:    R94.31 Abnormal EKG  History:        Patient has prior history of Echocardiogram examinations, most  recent 12/26/2019. TIA, Arrythmias:Atrial Flutter,                 Signs/Symptoms:Dyspnea; Risk Factors:Hypertension, Diabetes,                 Dyslipidemia and Former Smoker. Cancer.  Sonographer:    Tiffany Dance Referring Phys: [id number] [place]  1. Left ventricular ejection fraction, by estimation, is 30 to 35%. The left ventricle has moderately decreased function. The left ventricle demonstrates global hypokinesis. The left ventricular internal cavity size was mildly dilated. Left ventricular diastolic parameters are consistent with Grade I diastolic dysfunction (impaired relaxation). Elevated left atrial pressure.  2. Right ventricular systolic function is normal. The right ventricular size is normal.  3. The mitral valve is normal in structure. Mild mitral valve regurgitation. No evidence of mitral stenosis.  4. The aortic valve is tricuspid. Aortic valve regurgitation is not visualized. Mild aortic valve sclerosis is present, with no evidence of aortic valve stenosis.  5. The inferior vena cava is normal in size with greater than 50% respiratory variability, suggesting right atrial pressure of 3 mmHg. FINDINGS  Left Ventricle: Left ventricular ejection fraction, by estimation, is 30 to 35%. The left ventricle has moderately decreased function. The left ventricle demonstrates global hypokinesis. The left ventricular internal cavity size was mildly dilated. There is no left ventricular hypertrophy. Left ventricular diastolic parameters are consistent with Grade I diastolic dysfunction (impaired relaxation). Elevated left atrial pressure. Right Ventricle: The right ventricular size is normal. Right ventricular systolic function is normal. Left Atrium: Left atrial size was normal in size.  Right Atrium: Right atrial size was normal in size. Pericardium: There is no evidence of pericardial effusion. Mitral Valve: The mitral valve is normal in structure. Mild mitral valve regurgitation. No evidence of mitral valve stenosis. Tricuspid Valve: The tricuspid valve is normal in structure. Tricuspid valve regurgitation is mild . No evidence of tricuspid stenosis. Aortic Valve: The aortic valve is tricuspid. Aortic valve regurgitation is not visualized. Mild aortic valve sclerosis is present, with no evidence of aortic valve stenosis. Pulmonic Valve: The pulmonic valve was not well visualized. Pulmonic valve regurgitation is not visualized. No evidence of pulmonic stenosis. Aorta: The aortic root is normal in size and structure. Venous: The inferior vena cava is normal in size with greater than 50% respiratory variability, suggesting right atrial pressure of 3 mmHg.  LEFT VENTRICLE PLAX 2D LVIDd:         5.20 cm  Diastology LVIDs:         4.60 cm  LV e' medial:    5.25 cm/s LV PW:         1.20 cm  LV E/e' medial:  18.9 LV IVS:        0.80 cm  LV e' lateral:   5.18 cm/s LVOT diam:     2.20 cm  LV E/e' lateral: 19.1 LV SV:         66 LV SV Index:   33 LVOT Area:     3.80 cm  RIGHT VENTRICLE             IVC RV Basal diam:  3.40 cm     IVC diam: 1.60 cm RV Mid diam:    1.90 cm RV S prime:     13.00 cm/s TAPSE (M-mode): 1.9 cm LEFT ATRIUM             Index       RIGHT ATRIUM  Index LA diam:        3.60 cm 1.81 cm/m  RA Area:     18.70 cm LA Vol (A2C):   86.6 ml 43.64 ml/m RA Volume:   54.70 ml  27.57 ml/m LA Vol (A4C):   44.0 ml 22.17 ml/m LA Biplane Vol: 60.2 ml 30.34 ml/m  AORTIC VALVE LVOT Vmax:   74.00 cm/s LVOT Vmean:  54.100 cm/s LVOT VTI:    0.173 m  AORTA Ao Root diam: 3.30 cm Ao Asc diam:  3.00 cm MITRAL VALVE MV Area (PHT): 3.42 cm    SHUNTS MV Decel Time: 222 msec    Systemic VTI:  0.17 m MV E velocity: 99.10 cm/s  Systemic Diam: 2.20 cm MV A velocity: 87.70 cm/s MV E/A ratio:  1.13  Kirk Ruths MD Electronically signed by Kirk Ruths MD Signature Date/Time: 01/24/2020/2:41:59 PM    Final     Cardiac Studies   Echo 01/25/2020 1. Left ventricular ejection fraction, by estimation, is 30 to 35%. The  left ventricle has moderately decreased function. The left ventricle  demonstrates global hypokinesis. The left ventricular internal cavity size  was mildly dilated. Left ventricular  diastolic parameters are consistent with Grade I diastolic dysfunction  (impaired relaxation). Elevated left atrial pressure.  2. Right ventricular systolic function is normal. The right ventricular  size is normal.  3. The mitral valve is normal in structure. Mild mitral valve  regurgitation. No evidence of mitral stenosis.  4. The aortic valve is tricuspid. Aortic valve regurgitation is not  visualized. Mild aortic valve sclerosis is present, with no evidence of  aortic valve stenosis.  5. The inferior vena cava is normal in size with greater than 50%  respiratory variability, suggesting right atrial pressure of 3 mmHg.   Patient Profile     74 y.o. male with known CAD s/p PCI to RCA (2007), LAD and OM2 (2019), newly diagnosed HFrEF with LVEF 30-35% (global hypokinesis), pAfib not on AC, and bladder cancer c/b gross hematuria who initially presented [facility] for hematuria and anemia with course complicated by chest pain prompting transfer to [facility] for further management.  Assessment & Plan    1. Unstable angina - Hs-troponin 29>>30>>39. Did not felt candidate for cath or antiplatelet therapy due to ongoing hematuria and anemia. See yesterday's note for details. - Continue statin, BB, Imdur and amlodipine. - Thankfully, he is chest pain free  2. Chronic systolic CHF - Echo yesterday showed LVEF of 30-35%, global hypokinesis and garde 1 DD (LVEF was 25-30% on 12/2019 & 60-65% on 08/2019) - Continue metoprolol 50mg  BID >> up-titirate as BP allows and consolidate to long acting -  No ACE/ARB/Spironolactone/Enterestp due to worsening renal function   3. AKI - Renal function continues to worsen 2.03>>2.1>>3.35 - Got torsemide yesterday and now getting fluids - consider nephrology eval - mild volume overload on exam >> consider low dose lasix  Otherwise per primary/nephrology  Patient looks pale. Waiting for IM with update>> sent secure chat message. Per urology note, plan to transfer to [facility] and start radiation. Consider close follow up in step down. Poor prognosis.    For questions or updates, please contact Hand Please consult [email or web address] for contact info under      SignedLeanor Kail, PA  01/25/2020, 10:34 AM    Patient seen and examined with [facility] PA.  Agree as above, with the following exceptions and changes as noted below. Patient is resting in bed after medication for bladder  spasm. Noted to have worsening renal function. Gen: NAD, CV: RRR, Lungs: no increased work of breathing Neuro/Psych: sleeping. All available labs, radiology testing, previous records reviewed. Challenging situation, urologic issues take precedence at this time. Remains high risk from a cardiovascular standpoint, now with worsening renal function. To [facility] today for radiation therapy.   Elouise Munroe, MD 01/25/20 11:38 AM

## 2020-01-25 NOTE — Progress Notes (Signed)
Patient taken for CT scan evaluate his left flank pain and upper Kidney tract,he conts with CBI in progress keep urine clear from clots. I have had to irrigate him times 3 this am for clots clogging up his catheter and disrupting flow of CBI. He conts require frequent pain meds and meds for nausea control. He is not eating or drinking he has been started on IV fluids to keep him hydrated. Wife remains at bedside aware of above talked with Dr.Newsome regarding tx plan.

## 2020-01-25 NOTE — Progress Notes (Signed)
Blood transfusion conts without diffulculty,I have been able to titrate his o2 needs to 4 lpm/[place]. Family remains at bedside. No distress noted.

## 2020-01-26 ENCOUNTER — Inpatient Hospital Stay (HOSPITAL_COMMUNITY): Payer: Medicare Other

## 2020-01-26 ENCOUNTER — Inpatient Hospital Stay (HOSPITAL_COMMUNITY): Payer: Medicare Other | Admitting: Anesthesiology

## 2020-01-26 ENCOUNTER — Encounter (HOSPITAL_COMMUNITY)
Admission: EM | Disposition: A | Discharge status: Home Health | Payer: Self-pay | Source: Home / Self Care | Attending: Internal Medicine

## 2020-01-26 ENCOUNTER — Inpatient Hospital Stay: Admit: 2020-01-26 | Payer: Medicare Other | Admitting: Urology

## 2020-01-26 DIAGNOSIS — I25110 Atherosclerotic heart disease of native coronary artery with unstable angina pectoris: Secondary | ICD-10-CM

## 2020-01-26 DIAGNOSIS — I208 Other forms of angina pectoris: Secondary | ICD-10-CM

## 2020-01-26 DIAGNOSIS — E1165 Type 2 diabetes mellitus with hyperglycemia: Secondary | ICD-10-CM

## 2020-01-26 DIAGNOSIS — F17210 Nicotine dependence, cigarettes, uncomplicated: Secondary | ICD-10-CM

## 2020-01-26 DIAGNOSIS — C679 Malignant neoplasm of bladder, unspecified: Secondary | ICD-10-CM

## 2020-01-26 DIAGNOSIS — D62 Acute posthemorrhagic anemia: Secondary | ICD-10-CM

## 2020-01-26 DIAGNOSIS — E782 Mixed hyperlipidemia: Secondary | ICD-10-CM

## 2020-01-26 DIAGNOSIS — I5042 Chronic combined systolic (congestive) and diastolic (congestive) heart failure: Secondary | ICD-10-CM

## 2020-01-26 DIAGNOSIS — D649 Anemia, unspecified: Secondary | ICD-10-CM

## 2020-01-26 DIAGNOSIS — N179 Acute kidney failure, unspecified: Secondary | ICD-10-CM

## 2020-01-26 DIAGNOSIS — I480 Paroxysmal atrial fibrillation: Secondary | ICD-10-CM

## 2020-01-26 DIAGNOSIS — Z7984 Long term (current) use of oral hypoglycemic drugs: Secondary | ICD-10-CM

## 2020-01-26 DIAGNOSIS — I10 Essential (primary) hypertension: Secondary | ICD-10-CM

## 2020-01-26 DIAGNOSIS — Z79899 Other long term (current) drug therapy: Secondary | ICD-10-CM

## 2020-01-26 DIAGNOSIS — I2510 Atherosclerotic heart disease of native coronary artery without angina pectoris: Secondary | ICD-10-CM

## 2020-01-26 DIAGNOSIS — Z7982 Long term (current) use of aspirin: Secondary | ICD-10-CM

## 2020-01-26 HISTORY — PX: CYSTOSCOPY WITH FULGERATION: SHX6638

## 2020-01-26 HISTORY — PX: CYSTOSCOPY W/ URETERAL STENT PLACEMENT: SHX1429

## 2020-01-26 LAB — TYPE AND SCREEN
ABO/RH(D): O POS
Antibody Screen: NEGATIVE
Unit division: 0

## 2020-01-26 LAB — BPAM RBC
Blood Product Expiration Date: 202201042359
ISSUE DATE / TIME: 202112031607
Unit Type and Rh: 5100

## 2020-01-26 LAB — CBC
HCT: 28.7 % — ABNORMAL LOW (ref 39.0–52.0)
Hemoglobin: 9.1 g/dL — ABNORMAL LOW (ref 13.0–17.0)
MCH: 28.2 pg (ref 26.0–34.0)
MCHC: 31.7 g/dL (ref 30.0–36.0)
MCV: 88.9 fL (ref 80.0–100.0)
Platelets: 199 10*3/uL (ref 150–400)
RBC: 3.23 MIL/uL — ABNORMAL LOW (ref 4.22–5.81)
RDW: 14.7 % (ref 11.5–15.5)
WBC: 12.2 10*3/uL — ABNORMAL HIGH (ref 4.0–10.5)
nRBC: 0 % (ref 0.0–0.2)

## 2020-01-26 LAB — COMPREHENSIVE METABOLIC PANEL
ALT: 15 U/L (ref 0–44)
AST: 22 U/L (ref 15–41)
Albumin: 2.9 g/dL — ABNORMAL LOW (ref 3.5–5.0)
Alkaline Phosphatase: 94 U/L (ref 38–126)
Anion gap: 8 (ref 5–15)
BUN: 52 mg/dL — ABNORMAL HIGH (ref 8–23)
CO2: 27 mmol/L (ref 22–32)
Calcium: 9.1 mg/dL (ref 8.9–10.3)
Chloride: 103 mmol/L (ref 98–111)
Creatinine, Ser: 3.02 mg/dL — ABNORMAL HIGH (ref 0.61–1.24)
GFR, Estimated: 21 mL/min — ABNORMAL LOW (ref >60)
Glucose, Bld: 118 mg/dL — ABNORMAL HIGH (ref 70–99)
Potassium: 5.2 mmol/L — ABNORMAL HIGH (ref 3.5–5.1)
Sodium: 138 mmol/L (ref 135–145)
Total Bilirubin: 0.8 mg/dL (ref 0.3–1.2)
Total Protein: 5.5 g/dL — ABNORMAL LOW (ref 6.5–8.1)

## 2020-01-26 LAB — GLUCOSE, CAPILLARY
Glucose-Capillary: 142 mg/dL — ABNORMAL HIGH (ref 70–99)
Glucose-Capillary: 235 mg/dL — ABNORMAL HIGH (ref 70–99)
Glucose-Capillary: 282 mg/dL — ABNORMAL HIGH (ref 70–99)

## 2020-01-26 LAB — MAGNESIUM: Magnesium: 2.3 mg/dL (ref 1.7–2.4)

## 2020-01-26 SURGERY — CYSTOSCOPY, WITH BLADDER FULGURATION
Anesthesia: General | Site: Ureter

## 2020-01-26 MED ORDER — SUCCINYLCHOLINE CHLORIDE 200 MG/10ML IV SOSY
PREFILLED_SYRINGE | INTRAVENOUS | Status: AC
  Filled 2020-01-26: qty 10

## 2020-01-26 MED ORDER — FENTANYL CITRATE (PF) 100 MCG/2ML IJ SOLN
25.0000 ug | INTRAMUSCULAR | Status: DC | PRN
  Administered 2020-01-26 (×2): 50 ug via INTRAVENOUS

## 2020-01-26 MED ORDER — CEFAZOLIN SODIUM-DEXTROSE 2-4 GM/100ML-% IV SOLN
INTRAVENOUS | Status: AC
  Filled 2020-01-26: qty 100

## 2020-01-26 MED ORDER — FENTANYL CITRATE (PF) 100 MCG/2ML IJ SOLN
INTRAMUSCULAR | Status: AC
  Filled 2020-01-26: qty 2

## 2020-01-26 MED ORDER — ONDANSETRON HCL 4 MG/2ML IJ SOLN
INTRAMUSCULAR | Status: DC | PRN
  Administered 2020-01-26: 4 mg via INTRAVENOUS

## 2020-01-26 MED ORDER — PROPOFOL 10 MG/ML IV BOLUS
INTRAVENOUS | Status: DC | PRN
  Administered 2020-01-26: 100 mg via INTRAVENOUS

## 2020-01-26 MED ORDER — PHENYLEPHRINE HCL-NACL 10-0.9 MG/250ML-% IV SOLN
INTRAVENOUS | Status: DC | PRN
  Administered 2020-01-26: 25 ug/min via INTRAVENOUS

## 2020-01-26 MED ORDER — ROCURONIUM BROMIDE 10 MG/ML (PF) SYRINGE
PREFILLED_SYRINGE | INTRAVENOUS | Status: DC | PRN
  Administered 2020-01-26: 20 mg via INTRAVENOUS

## 2020-01-26 MED ORDER — LIDOCAINE HCL (PF) 2 % IJ SOLN
INTRAMUSCULAR | Status: AC
  Filled 2020-01-26: qty 5

## 2020-01-26 MED ORDER — DEXAMETHASONE SODIUM PHOSPHATE 10 MG/ML IJ SOLN
INTRAMUSCULAR | Status: DC | PRN
  Administered 2020-01-26: 8 mg via INTRAVENOUS

## 2020-01-26 MED ORDER — DEXAMETHASONE SODIUM PHOSPHATE 10 MG/ML IJ SOLN
INTRAMUSCULAR | Status: AC
  Filled 2020-01-26: qty 1

## 2020-01-26 MED ORDER — SUCCINYLCHOLINE CHLORIDE 200 MG/10ML IV SOSY
PREFILLED_SYRINGE | INTRAVENOUS | Status: DC | PRN
  Administered 2020-01-26: 140 mg via INTRAVENOUS

## 2020-01-26 MED ORDER — PHENYLEPHRINE 40 MCG/ML (10ML) SYRINGE FOR IV PUSH (FOR BLOOD PRESSURE SUPPORT)
PREFILLED_SYRINGE | INTRAVENOUS | Status: DC | PRN
  Administered 2020-01-26 (×4): 80 ug via INTRAVENOUS

## 2020-01-26 MED ORDER — SUGAMMADEX SODIUM 200 MG/2ML IV SOLN
INTRAVENOUS | Status: DC | PRN
  Administered 2020-01-26: 160 mg via INTRAVENOUS

## 2020-01-26 MED ORDER — ACETAMINOPHEN 10 MG/ML IV SOLN
INTRAVENOUS | Status: AC
  Filled 2020-01-26: qty 100

## 2020-01-26 MED ORDER — ONDANSETRON HCL 4 MG/2ML IJ SOLN
INTRAMUSCULAR | Status: AC
  Filled 2020-01-26: qty 2

## 2020-01-26 MED ORDER — ACETAMINOPHEN 10 MG/ML IV SOLN
1000.0000 mg | Freq: Once | INTRAVENOUS | Status: AC
  Administered 2020-01-26: 1000 mg via INTRAVENOUS

## 2020-01-26 MED ORDER — SODIUM CHLORIDE 0.9 % IR SOLN
Status: DC | PRN
  Administered 2020-01-26: 15000 mL

## 2020-01-26 MED ORDER — FENTANYL CITRATE (PF) 100 MCG/2ML IJ SOLN
INTRAMUSCULAR | Status: DC | PRN
  Administered 2020-01-26: 25 ug via INTRAVENOUS
  Administered 2020-01-26: 50 ug via INTRAVENOUS
  Administered 2020-01-26: 25 ug via INTRAVENOUS

## 2020-01-26 MED ORDER — ONDANSETRON HCL 4 MG/2ML IJ SOLN: 4.0000 mg | Freq: Once | INTRAMUSCULAR | Status: DC | PRN

## 2020-01-26 MED ORDER — IOHEXOL 300 MG/ML  SOLN
INTRAMUSCULAR | Status: DC | PRN
  Administered 2020-01-26: 13 mL

## 2020-01-26 MED ORDER — SODIUM CHLORIDE 0.9 % IR SOLN
Status: DC | PRN
  Administered 2020-01-26: 1000 mL

## 2020-01-26 MED ORDER — ROCURONIUM BROMIDE 10 MG/ML (PF) SYRINGE
PREFILLED_SYRINGE | INTRAVENOUS | Status: AC
  Filled 2020-01-26: qty 10

## 2020-01-26 MED ORDER — LIDOCAINE HCL (CARDIAC) PF 100 MG/5ML IV SOSY
PREFILLED_SYRINGE | INTRAVENOUS | Status: DC | PRN
  Administered 2020-01-26: 60 mg via INTRAVENOUS

## 2020-01-26 MED ORDER — LACTATED RINGERS IV SOLN: INTRAVENOUS | Status: DC | PRN

## 2020-01-26 SURGICAL SUPPLY — 21 items
BAG DRN RND TRDRP ANRFLXCHMBR (UROLOGICAL SUPPLIES)
BAG URINE DRAIN 2000ML AR STRL (UROLOGICAL SUPPLIES) IMPLANT
BAG URO CATCHER STRL LF (MISCELLANEOUS) ×4 IMPLANT
CATH FOLEY 3WAY 30CC 22FR (CATHETERS) ×4 IMPLANT
CATH URET 5FR 28IN OPEN ENDED (CATHETERS) IMPLANT
CLOTH BEACON ORANGE TIMEOUT ST (SAFETY) ×4 IMPLANT
GLOVE SURG SS PI 8.0 STRL IVOR (GLOVE) IMPLANT
GOWN STRL REUS W/TWL XL LVL3 (GOWN DISPOSABLE) ×4 IMPLANT
GUIDEWIRE STR DUAL SENSOR (WIRE) ×4 IMPLANT
HOLDER FOLEY CATH W/STRAP (MISCELLANEOUS) IMPLANT
KIT TURNOVER KIT A (KITS) IMPLANT
LOOP CUT BIPOLAR 24F LRG (ELECTROSURGICAL) IMPLANT
MANIFOLD NEPTUNE II (INSTRUMENTS) ×4 IMPLANT
PACK CYSTO (CUSTOM PROCEDURE TRAY) ×4 IMPLANT
SUT ETHILON 3 0 PS 1 (SUTURE) IMPLANT
SYR 30ML LL (SYRINGE) IMPLANT
SYR TOOMEY IRRIG 70ML (MISCELLANEOUS) ×4
SYRINGE TOOMEY IRRIG 70ML (MISCELLANEOUS) ×2 IMPLANT
TUBING CONNECTING 10 (TUBING) ×3 IMPLANT
TUBING CONNECTING 10' (TUBING) ×1
TUBING UROLOGY SET (TUBING) ×4 IMPLANT

## 2020-01-26 NOTE — Progress Notes (Signed)
Pt arrived via transport from [facility] around [phone]. Pt now on 2L [place] and tele hooked up to pt. No complaints of pain at this time. MD on call made aware of pts arrival

## 2020-01-26 NOTE — Consult Note (Signed)
Palliative Medicine  Name: [name] Date: 01/26/2020 MRN: [id number]  DOB: [birth year]  Patient Care Team: Wannetta Sender, FNP as PCP - General (Family Medicine) Satira Sark, MD as PCP - Cardiology (Cardiology) Dishmon, Garwin Brothers, RN as Oncology Nurse Navigator (Oncology)    REASON FOR CONSULTATION: Richard [name] is a 74 y.o. male with multiple medical problems including muscle invasive bladder cancer who is not a candidate for cystectomy due to CAD, PAF, and CHF.  Patient is status post TURBT in August 2021.  Patient has had persistent gross hematuria over the past several weeks.  He was admitted to [facility] on 01/23/2020 with hematuria and chest pain and was transferred to [facility] for management.  On 01/26/2020, patient underwent TURBT.  He is pending XRT.  Palliative care was consulted over address goals and manage ongoing symptoms.  SOCIAL HISTORY:     reports that he has been smoking cigarettes. He has a 54.00 pack-year smoking history. He has never used smokeless tobacco. He reports that he does not drink alcohol and does not use drugs.  Patient is married and lives at home with his wife.  He has 3 daughters and a son.  One of patient's daughter is in RT for [employer].  Patient formally worked as a Administrator.  ADVANCE DIRECTIVES:  Does not have  CODE STATUS: Full code  PAST MEDICAL HISTORY: Past Medical History:  Diagnosis Date  . Anemia 12/2019  . Bladder cancer ([place])   . CAD (coronary artery disease)    a. 2007 - DES x 2 proximal to mid RCA b. 09/2017: DES to mid LAD and OM2. Patent stents along RCA.   [name] CHF (congestive heart failure) ([place])   . Essential hypertension   . History of atrial flutter    Status post ablation 2008 - Dr. Caryl Comes  . History of transient ischemic attack (TIA)   . Mixed hyperlipidemia    Statin intolerance  . Pneumonia   . Type 2 diabetes mellitus ([place])    A1C 11.5 06/2017    PAST SURGICAL HISTORY:  Past Surgical History:   Procedure Laterality Date  . BALLOON ANGIOPLASTY, ARTERY    . BICEPS TENDON REPAIR Left   . CARDIAC ELECTROPHYSIOLOGY MAPPING AND ABLATION    . CARPAL TUNNEL RELEASE Bilateral   . cervical neck fusion     x 4  . CORONARY ANGIOPLASTY    . CORONARY STENT INTERVENTION N/A 10/17/2017   Procedure: CORONARY STENT INTERVENTION;  Surgeon: Martinique, Peter M, MD;  Location: [facility];  Service: Cardiovascular;  Laterality: N/A;  . CORONARY STENT PLACEMENT     x 2  . CYSTOSCOPY W/ RETROGRADES Bilateral 10/08/2019   Procedure: CYSTOSCOPY WITH BILATERAL RETROGRADE PYELOGRAM;  Surgeon: Cleon Gustin, MD;  Location: [facility];  Service: Urology;  Laterality: Bilateral;  . CYSTOSCOPY WITH URETHRAL DILATATION  10/08/2019   Procedure: CYSTOSCOPY WITH URETHRAL DILATATION;  Surgeon: Cleon Gustin, MD;  Location: [facility];  Service: Urology;;  . LEFT HEART CATH AND CORONARY ANGIOGRAPHY N/A 10/17/2017   Procedure: LEFT HEART CATH AND CORONARY ANGIOGRAPHY;  Surgeon: Martinique, Peter M, MD;  Location: [facility];  Service: Cardiovascular;  Laterality: N/A;  . ROTATOR CUFF REPAIR Right    x 4  . TIBIA FRACTURE SURGERY Left   . TRANSURETHRAL RESECTION OF BLADDER TUMOR N/A 10/08/2019   Procedure: TRANSURETHRAL RESECTION OF BLADDER TUMOR (TURBT);  Surgeon: Cleon Gustin, MD;  Location: [facility];  Service: Urology;  Laterality: N/A;    HEMATOLOGY/ONCOLOGY HISTORY:  Oncology History   No history exists.    ALLERGIES:  is allergic to statins.  MEDICATIONS:  Current Facility-Administered Medications  Medication Dose Route Frequency Provider Last Rate Last Admin  . 0.9 %  sodium chloride infusion   Intravenous Continuous Minor, Grace Bushy, NP 10 mL/hr at 01/25/20 1251 Rate Change at 01/25/20 1251  . acetaminophen (OFIRMEV) 10 MG/ML IV           . acetaminophen (TYLENOL) tablet 650 mg  650 mg Oral Q6H PRN Wynetta Emery, Clanford L, MD   650 mg at 01/23/20 2223   Or  . acetaminophen (TYLENOL)  suppository 650 mg  650 mg Rectal Q6H PRN Johnson, Clanford L, MD      . amLODipine (NORVASC) tablet 2.5 mg  2.5 mg Oral Daily Johnson, Clanford L, MD   2.5 mg at 01/26/20 1320  . atorvastatin (LIPITOR) tablet 10 mg  10 mg Oral Daily Johnson, Clanford L, MD   10 mg at 01/26/20 1320  . bisacodyl (DULCOLAX) EC tablet 5 mg  5 mg Oral Daily PRN Johnson, Clanford L, MD      . Chlorhexidine Gluconate Cloth 2 % PADS 6 each  6 each Topical Daily Wynetta Emery, Clanford L, MD   6 each at 01/26/20 1428  . fentaNYL (SUBLIMAZE) 100 MCG/2ML injection           . HYDROcodone-acetaminophen (NORCO/VICODIN) 5-325 MG per tablet 1 tablet  1 tablet Oral Q6H PRN Zierle-Ghosh, Asia B, DO   1 tablet at 01/26/20 1429  . HYDROmorphone (DILAUDID) injection 1 mg  1 mg Intravenous Q2H PRN Dahal, Marlowe Aschoff, MD   1 mg at 01/26/20 1320  . insulin aspart (novoLOG) injection 0-5 Units  0-5 Units Subcutaneous QHS Wynetta Emery, Clanford L, MD   5 Units at 01/23/20 2201  . insulin aspart (novoLOG) injection 0-9 Units  0-9 Units Subcutaneous TID WC Wynetta Emery, Clanford L, MD   2 Units at 01/25/20 1752  . insulin aspart (novoLOG) injection 2 Units  2 Units Subcutaneous TID WC Johnson, Clanford L, MD   2 Units at 01/25/20 1753  . insulin glargine (LANTUS) injection 10 Units  10 Units Subcutaneous Daily Dahal, Binaya, MD      . isosorbide mononitrate (IMDUR) 24 hr tablet 30 mg  30 mg Oral QHS Johnson, Clanford L, MD   30 mg at 01/25/20 2145  . metoprolol tartrate (LOPRESSOR) tablet 50 mg  50 mg Oral BID Erlene Quan, PA-C   50 mg at 01/26/20 1320  . mirabegron ER (MYRBETRIQ) tablet 25 mg  25 mg Oral Daily Johnson, Clanford L, MD   25 mg at 01/26/20 1429  . nitroGLYCERIN (NITROSTAT) SL tablet 0.4 mg  0.4 mg Sublingual Q5 min PRN Johnson, Clanford L, MD      . ondansetron (ZOFRAN) injection 4 mg  4 mg Intravenous Q6H PRN Dahal, Marlowe Aschoff, MD   4 mg at 01/25/20 0949  . oxybutynin (DITROPAN) tablet 5 mg  5 mg Oral Q8H PRN Remi Haggard, MD   5 mg at  01/25/20 0208    VITAL SIGNS: BP 121/69 (BP Location: Left Arm)   Pulse 65   Temp 97.7 F (36.5 C) (Oral)   Resp 12   Ht 6' (1.829 m)   Wt 179 lb 0.2 oz (81.2 kg)   SpO2 99%   BMI 24.28 kg/m  Filed Weights   01/25/20 0510 01/26/20 0022 01/26/20 0400  Weight: 181  lb 9.6 oz (82.4 kg) 179 lb 0.2 oz (81.2 kg) 179 lb 0.2 oz (81.2 kg)    Estimated body mass index is 24.28 kg/m as calculated from the following:   Height as of this encounter: 6' (1.829 m).   Weight as of this encounter: 179 lb 0.2 oz (81.2 kg).  LABS: CBC:    Component Value Date/Time   WBC 12.2 (H) 01/26/2020 0317   HGB 9.1 (L) 01/26/2020 0317   HCT 28.7 (L) 01/26/2020 0317   PLT 199 01/26/2020 0317   MCV 88.9 01/26/2020 0317   NEUTROABS 7.0 01/25/2020 1405   LYMPHSABS 1.1 01/25/2020 1405   MONOABS 1.0 01/25/2020 1405   EOSABS 0.0 01/25/2020 1405   BASOSABS 0.1 01/25/2020 1405   Comprehensive Metabolic Panel:    Component Value Date/Time   NA 138 01/26/2020 0317   K 5.2 (H) 01/26/2020 0317   CL 103 01/26/2020 0317   CO2 27 01/26/2020 0317   BUN 52 (H) 01/26/2020 0317   CREATININE 3.02 (H) 01/26/2020 0317   GLUCOSE 118 (H) 01/26/2020 0317   CALCIUM 9.1 01/26/2020 0317   AST 22 01/26/2020 0317   ALT 15 01/26/2020 0317   ALKPHOS 94 01/26/2020 0317   BILITOT 0.8 01/26/2020 0317   PROT 5.5 (L) 01/26/2020 0317   ALBUMIN 2.9 (L) 01/26/2020 0317    RADIOGRAPHIC STUDIES: CT ABDOMEN PELVIS WO CONTRAST  Result Date: 01/25/2020 CLINICAL DATA:  Bladder cancer.  Flank pain. EXAM: CT ABDOMEN AND PELVIS WITHOUT CONTRAST TECHNIQUE: Multidetector CT imaging of the abdomen and pelvis was performed following the standard protocol without IV contrast. COMPARISON:  September 16, 2019. FINDINGS: Lower chest: No acute abnormality. Hepatobiliary: No focal liver abnormality is seen. No gallstones, gallbladder wall thickening, or biliary dilatation. Pancreas: Unremarkable. No pancreatic ductal dilatation or surrounding  inflammatory changes. Spleen: Normal in size without focal abnormality. Adrenals/Urinary Tract: Adrenal glands appear normal. Mild bilateral perinephric stranding is noted. Right ureteral stent is in grossly good position. Mild bilateral hydronephrosis is noted. Foley catheter is noted within distended urinary bladder. Large amount of blood clot is noted within the bladder lumen. Mild wall thickening is seen involving the left side of the urinary bladder likely reflecting postoperative change. Stomach/Bowel: Stomach is within normal limits. Appendix appears normal. No evidence of bowel wall thickening, distention, or inflammatory changes. Vascular/Lymphatic: Aortic atherosclerosis. No enlarged abdominal or pelvic lymph nodes. Reproductive: Prostate is unremarkable. Other: Mild anasarca is noted. Small fluid-filled left inguinal hernia is noted. Musculoskeletal: No acute or significant osseous findings. IMPRESSION: 1. Right ureteral stent is in grossly good position. Mild bilateral hydronephrosis is noted. 2. Large amount of blood clot is noted within the bladder lumen. Mild wall thickening is seen involving the left side of the urinary bladder likely reflecting postoperative change. 3. Mild anasarca is noted. 4. Small fluid-filled left inguinal hernia. Aortic Atherosclerosis (ICD10-I70.0). Electronically Signed   By: Marijo Conception M.D.   On: 01/25/2020 09:34   DG Chest Port 1 View  Result Date: 01/25/2020 CLINICAL DATA:  Abnormal respiration, LEFT flank pain EXAM: PORTABLE CHEST 1 VIEW COMPARISON:  Portable exam 1233 hours compared to 01/23/2020 FINDINGS: Upper normal heart size with pulmonary vascular congestion. Mediastinal contours normal. Atherosclerotic calcification aorta. Minimal RIGHT basilar atelectasis. Lungs is otherwise clear. No pulmonary infiltrate, pleural effusion, or pneumothorax. Prior cervical spine fusion. IMPRESSION: Minimal RIGHT basilar atelectasis. Aortic Atherosclerosis (ICD10-I70.0).  Electronically Signed   By: Lavonia Dana M.D.   On: 01/25/2020 13:00   DG Chest [facility]  1 View  Result Date: 01/23/2020 CLINICAL DATA:  Chest pain EXAM: PORTABLE CHEST 1 VIEW COMPARISON:  11/21/2019 FINDINGS: Artifact from EKG leads. There is no edema, consolidation, effusion, or pneumothorax. Normal heart size and mediastinal contours. IMPRESSION: No evidence of active disease. Electronically Signed   By: Monte Fantasia M.D.   On: 01/23/2020 11:47   DG C-Arm 1-60 Min-No Report  Result Date: 01/26/2020 Fluoroscopy was utilized by the requesting physician.  No radiographic interpretation.   ECHOCARDIOGRAM COMPLETE  Result Date: 01/24/2020    ECHOCARDIOGRAM REPORT   Patient Name:   VIET KEMMERER Date of Exam: 01/24/2020 Medical Rec #:  [id number]      Height:       72.0 in Accession #:    [id number]     Weight:       169.2 lb Date of Birth:  [birth year]      BSA:          1.984 m Patient Age:    19 years       BP:           152/90 mmHg Patient Gender: M              HR:           86 bpm. Exam Location:  Inpatient Procedure: 2D Echo, Cardiac Doppler and Color Doppler Indications:    R94.31 Abnormal EKG  History:        Patient has prior history of Echocardiogram examinations, most                 recent 12/26/2019. TIA, Arrythmias:Atrial Flutter,                 Signs/Symptoms:Dyspnea; Risk Factors:Hypertension, Diabetes,                 Dyslipidemia and Former Smoker. Cancer.  Sonographer:    Tiffany Dance Referring Phys: [id number] [place]  1. Left ventricular ejection fraction, by estimation, is 30 to 35%. The left ventricle has moderately decreased function. The left ventricle demonstrates global hypokinesis. The left ventricular internal cavity size was mildly dilated. Left ventricular diastolic parameters are consistent with Grade I diastolic dysfunction (impaired relaxation). Elevated left atrial pressure.  2. Right ventricular systolic function is normal. The right ventricular size is  normal.  3. The mitral valve is normal in structure. Mild mitral valve regurgitation. No evidence of mitral stenosis.  4. The aortic valve is tricuspid. Aortic valve regurgitation is not visualized. Mild aortic valve sclerosis is present, with no evidence of aortic valve stenosis.  5. The inferior vena cava is normal in size with greater than 50% respiratory variability, suggesting right atrial pressure of 3 mmHg. FINDINGS  Left Ventricle: Left ventricular ejection fraction, by estimation, is 30 to 35%. The left ventricle has moderately decreased function. The left ventricle demonstrates global hypokinesis. The left ventricular internal cavity size was mildly dilated. There is no left ventricular hypertrophy. Left ventricular diastolic parameters are consistent with Grade I diastolic dysfunction (impaired relaxation). Elevated left atrial pressure. Right Ventricle: The right ventricular size is normal. Right ventricular systolic function is normal. Left Atrium: Left atrial size was normal in size. Right Atrium: Right atrial size was normal in size. Pericardium: There is no evidence of pericardial effusion. Mitral Valve: The mitral valve is normal in structure. Mild mitral valve regurgitation. No evidence of mitral valve stenosis. Tricuspid Valve: The tricuspid valve is normal in structure. Tricuspid valve regurgitation is mild . No evidence  of tricuspid stenosis. Aortic Valve: The aortic valve is tricuspid. Aortic valve regurgitation is not visualized. Mild aortic valve sclerosis is present, with no evidence of aortic valve stenosis. Pulmonic Valve: The pulmonic valve was not well visualized. Pulmonic valve regurgitation is not visualized. No evidence of pulmonic stenosis. Aorta: The aortic root is normal in size and structure. Venous: The inferior vena cava is normal in size with greater than 50% respiratory variability, suggesting right atrial pressure of 3 mmHg.  LEFT VENTRICLE PLAX 2D LVIDd:         5.20 cm   Diastology LVIDs:         4.60 cm  LV e' medial:    5.25 cm/s LV PW:         1.20 cm  LV E/e' medial:  18.9 LV IVS:        0.80 cm  LV e' lateral:   5.18 cm/s LVOT diam:     2.20 cm  LV E/e' lateral: 19.1 LV SV:         66 LV SV Index:   33 LVOT Area:     3.80 cm  RIGHT VENTRICLE             IVC RV Basal diam:  3.40 cm     IVC diam: 1.60 cm RV Mid diam:    1.90 cm RV S prime:     13.00 cm/s TAPSE (M-mode): 1.9 cm LEFT ATRIUM             Index       RIGHT ATRIUM           Index LA diam:        3.60 cm 1.81 cm/m  RA Area:     18.70 cm LA Vol (A2C):   86.6 ml 43.64 ml/m RA Volume:   54.70 ml  27.57 ml/m LA Vol (A4C):   44.0 ml 22.17 ml/m LA Biplane Vol: 60.2 ml 30.34 ml/m  AORTIC VALVE LVOT Vmax:   74.00 cm/s LVOT Vmean:  54.100 cm/s LVOT VTI:    0.173 m  AORTA Ao Root diam: 3.30 cm Ao Asc diam:  3.00 cm MITRAL VALVE MV Area (PHT): 3.42 cm    SHUNTS MV Decel Time: 222 msec    Systemic VTI:  0.17 m MV E velocity: 99.10 cm/s  Systemic Diam: 2.20 cm MV A velocity: 87.70 cm/s MV E/A ratio:  1.13 Kirk Ruths MD Electronically signed by Kirk Ruths MD Signature Date/Time: 01/24/2020/2:41:59 PM    Final     PERFORMANCE STATUS (ECOG) : 1 - Symptomatic but completely ambulatory  Review of Systems Unless otherwise noted, a complete review of systems is negative.  Physical Exam General: NAD, frail appearing Pulmonary: Unlabored GU: Foley, hematuria Extremities: no edema, no joint deformities Skin: no rashes Neurological: Weakness but otherwise nonfocal  IMPRESSION: I met with patient, wife, and daughter.  I introduced palliative care services and attempted to establish therapeutic rapport.  Patient underwent cystoscopy and TURBT earlier today.  Both patient and family feel that he is doing significantly better following this procedure.  His pain seems to be better controlled.  He is currently eating his first meal in days.   Wife is a retired Therapist, sports and daughter is an RT.  Family seems to have a  reasonable understanding of his current medical problems.  Their goals are aligned with continued treatment.  Patient is pending XRT.  Family say they were told that patient likely cannot tolerate systemic treatment due  to his cardiovascular health.  However, they remain committed to any other treatment options available.  Patient does not have advance directives but family is interested in establishing those while he is in the hospital if possible.  Will consult chaplain to assist.  Symptomatically, he appears to feel better post procedure.  His pain seems better controlled.  Note recommendation for judicious use of opioids given previous episode of hypoventilation and hypoxia.  However, family does want to ensure that he is comfortable.  Disposition is unclear.  Patient would likely benefit from being followed by outpatient palliative care.  PLAN: -Continue current scope of treatment -Full code -Chaplain consult to help with ACP -Recommend outpatient palliative care -Will follow   Time Total: 30 minutes  Visit consisted of counseling and education dealing with the complex and emotionally intense issues of symptom management and palliative care in the setting of serious and potentially life-threatening illness.Greater than 50%  of this time was spent counseling and coordinating care related to the above assessment and plan.  Signed by: Altha Harm, PhD, NP-C

## 2020-01-26 NOTE — Progress Notes (Signed)
[facility]  S: seen in PACU after surgery, he has some pain, just received fentanyl  O: Vitals:   01/26/20 1150 01/26/20 1200 01/26/20 1215 01/26/20 1230  BP: (!) 172/82 (!) 170/91 (!) 159/89 (!) 154/84  Pulse: 86 80 79 80  Resp: 14 20 19 20   Temp: 97.8 F (36.6 C)     TempSrc:      SpO2: 96% 100% 97% 95%  Weight:      Height:       2L [place]  General:  Resting comfortably in bed HENT: NCAT OP clear PULM: CTA B, normal effort CV: RRR, no mgr GI: BS+, soft, nontender MSK: normal bulk and tone Neuro: awake, somewhat drowsy from , no distress, MAEW  CBC    Component Value Date/Time   WBC 12.2 (H) 01/26/2020 0317   RBC 3.23 (L) 01/26/2020 0317   HGB 9.1 (L) 01/26/2020 0317   HCT 28.7 (L) 01/26/2020 0317   PLT 199 01/26/2020 0317   MCV 88.9 01/26/2020 0317   MCH 28.2 01/26/2020 0317   MCHC 31.7 01/26/2020 0317   RDW 14.7 01/26/2020 0317   LYMPHSABS 1.1 01/25/2020 1405   MONOABS 1.0 01/25/2020 1405   EOSABS 0.0 01/25/2020 1405   BASOSABS 0.1 01/25/2020 1405   BMET    Component Value Date/Time   NA 138 01/26/2020 0317   K 5.2 (H) 01/26/2020 0317   CL 103 01/26/2020 0317   CO2 27 01/26/2020 0317   GLUCOSE 118 (H) 01/26/2020 0317   BUN 52 (H) 01/26/2020 0317   CREATININE 3.02 (H) 01/26/2020 0317   CALCIUM 9.1 01/26/2020 0317   GFRNONAA 21 (L) 01/26/2020 0317   GFRAA >60 11/06/2019 0933    Impression: Bladder tumor Clot retention Bilateral hydronephrosis Atelectasis  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 P[facility]M Pager: [phone] Cell: [phone] If no response, call [phone]

## 2020-01-26 NOTE — Op Note (Addendum)
Preoperative diagnosis: Bladder tumor (5cm) Clot retention Bilateral hydronephrosis  Postoperative diagnosis:       Same Procedure:  Cystoscopy Transurethral resection of bladder tumor (Large) overlapping sites. Clot evacuation Bilateral retrograde pyelography with interpretation Right ureteral stent removal  Surgeon:  Irine Seal, M.D.  Assistant: Ermelinda Das MD  Anesthesia: General  Complications: None  Intraoperative findings:  Large amount of old clot in bladder >300cc, successfully evacuated in entirety Large multifocal bladder tumor taking up left lateral wall and dome and a separate small tumor above right UO, largely papillary in appearance with active oozing surfaces, bleeding resolved following moderate resection and fulguration Right ureteral stent eminating from right UO with encrustation, removed and not replaced following normal R RPG with no hydronephrosis, normal caliber ureter without filling defects and good drainage of contrast media Left retrograde pyelogram without hydronephrosis, normal caliber ureter without filling defects and good drainage of contrast  Mildly irritated bladder neck at end of procedure, catheter was replaced and CBI was not started  EBL: Minimal  Specimens: Bladder tumor, large  Disposition of specimens: Pathology  Indication: Richard [name] is a patient who has known bladder tumor, admitted for clot retention. After reviewing the management options for treatment, he elected to proceed with the above surgical procedure(s). We have discussed the potential benefits and risks of the procedure, side effects of the proposed treatment, the likelihood of the patient achieving the goals of the procedure, and any potential problems that might occur during the procedure or recuperation. Informed consent has been obtained.  Description of procedure:  The patient was taken to the operating room and general anesthesia was induced.  The patient was  placed in the dorsal lithotomy position, prepped and draped in the usual sterile fashion, and preoperative antibiotics were administered. A preoperative time-out was performed.   Cystourethroscopy was performed.  The patient's urethra was examined and was normal/ demonstrated bilobar prostatic hypertrophy/ bilobar prostatic hypertrophy.   Large clot was noted. Approximately 300cc of clot was evacuated from bladder during procedure. The bladder was then systematically examined in its entirety. There was significant amount of bladder tumor, multifocal papillary tumor with active oozing at left bladder wall, bladder dome and above right UO.   The bladder was then re-examined after the resectoscope was placed.  Using loop cautery resection, the  tumor was partially resected in several locations to what appeared to be relatively normal bladder tissue in order to fulgurate completely and specimen was removed for permanent pathologic analysis.   Hemostasis was then achieved with the loop cautery and the bladder was emptied and reinspected with no further bleeding noted at the end of the procedure.    Attention then turned to the right ureteral orifice with stent eminating with encrustation. Stent was removed and a ureteral catheter was used to intubate the ureteral orifice.  Omnipaque contrast was injected through the ureteral catheter and a retrograde pyelogram was performed with findings as dictated above. No stent was replaced.   Attention then turned to the left ureteral orifice and a ureteral catheter was used to intubate the ureteral orifice.  Omnipaque contrast was injected through the ureteral catheter and a retrograde pyelogram was performed with findings as dictated above.  Bilateral efflux was noted from UOs. A 22Fr 3way catheter was placed with 30cc in balloon. CBI was not started. The patient appeared to tolerate the procedure well and without complications.  The patient was able to be awakened  and transferred to the recovery unit in satisfactory  condition.

## 2020-01-26 NOTE — Anesthesia Postprocedure Evaluation (Signed)
Anesthesia Post Note  Patient: Richard [name]  Procedure(s) Performed: CYSTOSCOPY WITH FULGERATION, CLOT EVACUATION transurethral resection bladder tumor (N/A Bladder) CYSTOSCOPY WITH RETROGRADE PYELOGRAM bilateral right ureter stent removal (Bilateral Ureter)     Patient location during evaluation: PACU Anesthesia Type: General Level of consciousness: awake and alert Pain management: pain level controlled Vital Signs Assessment: post-procedure vital signs reviewed and stable Respiratory status: spontaneous breathing, nonlabored ventilation and respiratory function stable Cardiovascular status: blood pressure returned to baseline and stable Postop Assessment: no apparent nausea or vomiting Anesthetic complications: no   No complications documented.  Last Vitals:  Vitals:   01/26/20 1215 01/26/20 1230  BP: (!) 159/89 (!) 154/84  Pulse: 79 80  Resp: 19 20  Temp:    SpO2: 97% 95%    Last Pain:  Vitals:   01/26/20 1215  TempSrc:   PainSc: 7                  Candra R Paulette Rock[place]

## 2020-01-26 NOTE — Transfer of Care (Signed)
Immediate Anesthesia Transfer of Care Note  Patient: Richard [name]  Procedure(s) Performed: CYSTOSCOPY WITH FULGERATION, CLOT EVACUATION transurethral resection bladder tumor (N/A Bladder) CYSTOSCOPY WITH RETROGRADE PYELOGRAM bilateral right ureter stent removal (Bilateral Ureter)  Patient Location: PACU  Anesthesia Type:General  Level of Consciousness: awake  Airway & Oxygen Therapy: Patient Spontanous Breathing and Patient connected to face mask oxygen  Post-op Assessment: Report given to RN and Post -op Vital signs reviewed and stable  Post vital signs: Reviewed and stable  Last Vitals:  Vitals Value Taken Time  BP 172/82 01/26/20 1149  Temp    Pulse 76 01/26/20 1153  Resp 20 01/26/20 1153  SpO2 97 % 01/26/20 1153  Vitals shown include unvalidated device data.  Last Pain:  Vitals:   01/26/20 0800  TempSrc:   PainSc: 0-No pain      Patients Stated Pain Goal: 2 (13/88/71 9597)  Complications: No complications documented.

## 2020-01-26 NOTE — Progress Notes (Signed)
Progress Note  Patient Name: Richard [name] Date of Encounter: 01/26/2020  [facility] Cardiologist: Rozann Lesches, MD   Subjective   Transferred to [facility] early this AM. Planned for cystoscopy and palliative radiation. Reportedly had a good night, pain well controlled. No chest pain or shortness of breath. Mild LE edema.  Inpatient Medications    Scheduled Meds: . amLODipine  2.5 mg Oral Daily  . atorvastatin  10 mg Oral Daily  . Chlorhexidine Gluconate Cloth  6 each Topical Daily  . insulin aspart  0-5 Units Subcutaneous QHS  . insulin aspart  0-9 Units Subcutaneous TID WC  . insulin aspart  2 Units Subcutaneous TID WC  . insulin glargine  10 Units Subcutaneous Daily  . isosorbide mononitrate  30 mg Oral QHS  . metoprolol tartrate  50 mg Oral BID  . mirabegron ER  25 mg Oral Daily   Continuous Infusions: . sodium chloride 10 mL/hr at 01/25/20 1251  .  ceFAZolin (ANCEF) IV     PRN Meds: acetaminophen **OR** acetaminophen, bisacodyl, HYDROcodone-acetaminophen, HYDROmorphone (DILAUDID) injection, nitroGLYCERIN, ondansetron (ZOFRAN) IV, oxybutynin   Vital Signs    Vitals:   01/26/20 0022 01/26/20 0400 01/26/20 0435 01/26/20 0726  BP: 114/73  121/67 111/67  Pulse: 70  77 72  Resp: 16   14  Temp: 98.1 F (36.7 C)  98 F (36.7 C) 98.3 F (36.8 C)  TempSrc: Oral  Oral   SpO2: 100%  100% 93%  Weight: 81.2 kg 81.2 kg    Height:  6' (1.829 m)      Intake/Output Summary (Last 24 hours) at 01/26/2020 0934 Last data filed at 01/26/2020 0336 Gross per 24 hour  Intake 12340 ml  Output 12950 ml  Net -610 ml   Last 3 Weights 01/26/2020 01/26/2020 01/25/2020  Weight (lbs) 179 lb 0.2 oz 179 lb 0.2 oz 181 lb 9.6 oz  Weight (kg) 81.2 kg 81.2 kg 82.373 kg      Telemetry    Sinus rhythm with PACs - Personally Reviewed  ECG    No new since 01/23/20  Physical Exam   GEN: Frail appearing, Garrard in place, no acute distress NECK: No JVD CARDIAC: regular rhythm, normal S1 and  S2, no rubs or gallops. No murmur. VASCULAR: Radial pulses 2+ bilaterally.  RESPIRATORY:  Clear to auscultation without rales, wheezing or rhonchi  ABDOMEN: Soft, non-tender, non-distended MUSCULOSKELETAL:  Moves all 4 limbs independently SKIN: Warm and dry, trace RLE edema, no significant LLE edema NEUROLOGIC:  No focal neuro deficits noted. PSYCHIATRIC:  Normal affect   Labs    High Sensitivity Troponin:   Recent Labs  Lab 01/23/20 0943 01/23/20 1235 01/24/20 1011  TROPONINIHS 29* 30* 39*      Chemistry Recent Labs  Lab 01/24/20 1011 01/25/20 0311 01/26/20 0317  NA 137 137 138  K 4.9 5.4* 5.2*  CL 97* 99 103  CO2 28 23 27   GLUCOSE 260* 267* 118*  BUN 42* 44* 52*  CREATININE 2.10* 3.35* 3.02*  CALCIUM 9.3 9.0 9.1  PROT  --  5.8* 5.5*  ALBUMIN  --  3.2* 2.9*  AST  --  22 22  ALT  --  15 15  ALKPHOS  --  97 94  BILITOT  --  1.0 0.8  GFRNONAA 32* 19* 21*  ANIONGAP 12 15 8      Hematology Recent Labs  Lab 01/25/20 0311 01/25/20 1405 01/26/20 0317  WBC 11.6* 9.1 12.2*  RBC 2.93* 2.67* 3.23*  HGB 8.5* 7.3* 9.1*  HCT 25.9* 23.6* 28.7*  MCV 88.4 88.4 88.9  MCH 29.0 27.3 28.2  MCHC 32.8 30.9 31.7  RDW 15.3 15.2 14.7  PLT 252 213 199    Radiology    CT ABDOMEN PELVIS WO CONTRAST  Result Date: 01/25/2020 CLINICAL DATA:  Bladder cancer.  Flank pain. EXAM: CT ABDOMEN AND PELVIS WITHOUT CONTRAST TECHNIQUE: Multidetector CT imaging of the abdomen and pelvis was performed following the standard protocol without IV contrast. COMPARISON:  September 16, 2019. FINDINGS: Lower chest: No acute abnormality. Hepatobiliary: No focal liver abnormality is seen. No gallstones, gallbladder wall thickening, or biliary dilatation. Pancreas: Unremarkable. No pancreatic ductal dilatation or surrounding inflammatory changes. Spleen: Normal in size without focal abnormality. Adrenals/Urinary Tract: Adrenal glands appear normal. Mild bilateral perinephric stranding is noted. Right ureteral  stent is in grossly good position. Mild bilateral hydronephrosis is noted. Foley catheter is noted within distended urinary bladder. Large amount of blood clot is noted within the bladder lumen. Mild wall thickening is seen involving the left side of the urinary bladder likely reflecting postoperative change. Stomach/Bowel: Stomach is within normal limits. Appendix appears normal. No evidence of bowel wall thickening, distention, or inflammatory changes. Vascular/Lymphatic: Aortic atherosclerosis. No enlarged abdominal or pelvic lymph nodes. Reproductive: Prostate is unremarkable. Other: Mild anasarca is noted. Small fluid-filled left inguinal hernia is noted. Musculoskeletal: No acute or significant osseous findings. IMPRESSION: 1. Right ureteral stent is in grossly good position. Mild bilateral hydronephrosis is noted. 2. Large amount of blood clot is noted within the bladder lumen. Mild wall thickening is seen involving the left side of the urinary bladder likely reflecting postoperative change. 3. Mild anasarca is noted. 4. Small fluid-filled left inguinal hernia. Aortic Atherosclerosis (ICD10-I70.0). Electronically Signed   By: Marijo Conception M.D.   On: 01/25/2020 09:34   DG Chest Port 1 View  Result Date: 01/25/2020 CLINICAL DATA:  Abnormal respiration, LEFT flank pain EXAM: PORTABLE CHEST 1 VIEW COMPARISON:  Portable exam 1233 hours compared to 01/23/2020 FINDINGS: Upper normal heart size with pulmonary vascular congestion. Mediastinal contours normal. Atherosclerotic calcification aorta. Minimal RIGHT basilar atelectasis. Lungs is otherwise clear. No pulmonary infiltrate, pleural effusion, or pneumothorax. Prior cervical spine fusion. IMPRESSION: Minimal RIGHT basilar atelectasis. Aortic Atherosclerosis (ICD10-I70.0). Electronically Signed   By: Lavonia Dana M.D.   On: 01/25/2020 13:00   ECHOCARDIOGRAM COMPLETE  Result Date: 01/24/2020    ECHOCARDIOGRAM REPORT   Patient Name:   Richard [name] Date of  Exam: 01/24/2020 Medical Rec #:  [id number]      Height:       72.0 in Accession #:    [id number]     Weight:       169.2 lb Date of Birth:  [birth year]      BSA:          1.984 m Patient Age:    57 years       BP:           152/90 mmHg Patient Gender: M              HR:           86 bpm. Exam Location:  Inpatient Procedure: 2D Echo, Cardiac Doppler and Color Doppler Indications:    R94.31 Abnormal EKG  History:        Patient has prior history of Echocardiogram examinations, most                 recent 12/26/2019. TIA, Arrythmias:Atrial  Flutter,                 Signs/Symptoms:Dyspnea; Risk Factors:Hypertension, Diabetes,                 Dyslipidemia and Former Smoker. Cancer.  Sonographer:    Tiffany Dance Referring Phys: [id number] [place]  1. Left ventricular ejection fraction, by estimation, is 30 to 35%. The left ventricle has moderately decreased function. The left ventricle demonstrates global hypokinesis. The left ventricular internal cavity size was mildly dilated. Left ventricular diastolic parameters are consistent with Grade I diastolic dysfunction (impaired relaxation). Elevated left atrial pressure.  2. Right ventricular systolic function is normal. The right ventricular size is normal.  3. The mitral valve is normal in structure. Mild mitral valve regurgitation. No evidence of mitral stenosis.  4. The aortic valve is tricuspid. Aortic valve regurgitation is not visualized. Mild aortic valve sclerosis is present, with no evidence of aortic valve stenosis.  5. The inferior vena cava is normal in size with greater than 50% respiratory variability, suggesting right atrial pressure of 3 mmHg. FINDINGS  Left Ventricle: Left ventricular ejection fraction, by estimation, is 30 to 35%. The left ventricle has moderately decreased function. The left ventricle demonstrates global hypokinesis. The left ventricular internal cavity size was mildly dilated. There is no left ventricular hypertrophy. Left  ventricular diastolic parameters are consistent with Grade I diastolic dysfunction (impaired relaxation). Elevated left atrial pressure. Right Ventricle: The right ventricular size is normal. Right ventricular systolic function is normal. Left Atrium: Left atrial size was normal in size. Right Atrium: Right atrial size was normal in size. Pericardium: There is no evidence of pericardial effusion. Mitral Valve: The mitral valve is normal in structure. Mild mitral valve regurgitation. No evidence of mitral valve stenosis. Tricuspid Valve: The tricuspid valve is normal in structure. Tricuspid valve regurgitation is mild . No evidence of tricuspid stenosis. Aortic Valve: The aortic valve is tricuspid. Aortic valve regurgitation is not visualized. Mild aortic valve sclerosis is present, with no evidence of aortic valve stenosis. Pulmonic Valve: The pulmonic valve was not well visualized. Pulmonic valve regurgitation is not visualized. No evidence of pulmonic stenosis. Aorta: The aortic root is normal in size and structure. Venous: The inferior vena cava is normal in size with greater than 50% respiratory variability, suggesting right atrial pressure of 3 mmHg.  LEFT VENTRICLE PLAX 2D LVIDd:         5.20 cm  Diastology LVIDs:         4.60 cm  LV e' medial:    5.25 cm/s LV PW:         1.20 cm  LV E/e' medial:  18.9 LV IVS:        0.80 cm  LV e' lateral:   5.18 cm/s LVOT diam:     2.20 cm  LV E/e' lateral: 19.1 LV SV:         66 LV SV Index:   33 LVOT Area:     3.80 cm  RIGHT VENTRICLE             IVC RV Basal diam:  3.40 cm     IVC diam: 1.60 cm RV Mid diam:    1.90 cm RV S prime:     13.00 cm/s TAPSE (M-mode): 1.9 cm LEFT ATRIUM             Index       RIGHT ATRIUM  Index LA diam:        3.60 cm 1.81 cm/m  RA Area:     18.70 cm LA Vol (A2C):   86.6 ml 43.64 ml/m RA Volume:   54.70 ml  27.57 ml/m LA Vol (A4C):   44.0 ml 22.17 ml/m LA Biplane Vol: 60.2 ml 30.34 ml/m  AORTIC VALVE LVOT Vmax:   74.00 cm/s LVOT  Vmean:  54.100 cm/s LVOT VTI:    0.173 m  AORTA Ao Root diam: 3.30 cm Ao Asc diam:  3.00 cm MITRAL VALVE MV Area (PHT): 3.42 cm    SHUNTS MV Decel Time: 222 msec    Systemic VTI:  0.17 m MV E velocity: 99.10 cm/s  Systemic Diam: 2.20 cm MV A velocity: 87.70 cm/s MV E/A ratio:  1.13 Kirk Ruths MD Electronically signed by Kirk Ruths MD Signature Date/Time: 01/24/2020/2:41:59 PM    Final     Cardiac Studies   Echo 01/25/2020 1. Left ventricular ejection fraction, by estimation, is 30 to 35%. The  left ventricle has moderately decreased function. The left ventricle  demonstrates global hypokinesis. The left ventricular internal cavity size  was mildly dilated. Left ventricular  diastolic parameters are consistent with Grade I diastolic dysfunction  (impaired relaxation). Elevated left atrial pressure.  2. Right ventricular systolic function is normal. The right ventricular  size is normal.  3. The mitral valve is normal in structure. Mild mitral valve  regurgitation. No evidence of mitral stenosis.  4. The aortic valve is tricuspid. Aortic valve regurgitation is not  visualized. Mild aortic valve sclerosis is present, with no evidence of  aortic valve stenosis.  5. The inferior vena cava is normal in size with greater than 50%  respiratory variability, suggesting right atrial pressure of 3 mmHg.   Patient Profile     74 y.o. male with known CAD s/p PCI to RCA (2007), LAD and OM2 (2019), newly diagnosed HFrEF with LVEF 30-35% (global hypokinesis), pAfib not on AC, and bladder cancer c/b gross hematuria who initially presented [facility] for hematuria and anemia with course complicated by chest pain prompting transfer to [facility] for further management. Then transferred to [facility] for cystoscopy and palliative radiation given hydronephrosis.  Assessment & Plan    Unstable angina Known CAD with prior interventions -Hs-troponin 29>>30>>39.  -no further chest pain -Not a candidate  for cath or antiplatelet therapy due to ongoing hematuria and anemia.. -Continue statin, BB, Imdur and amlodipine.  Chronic systolic and diastolic heart failure -Echo this admission showed LVEF of 30-35%, global hypokinesis and grade 1 DD (LVEF was 25-30% on 12/2019 & 60-65% on 08/2019) -Continue metoprolol 50mg  BID for now, consolidate prior to discharge -No ACE/ARB/Spironolactone/Enteresto due to worsening renal function  -mild RLE edema but otherwise euvolemic on exam  History of paroxysmal atrial fibrillation s/p prior ablation -not a candidate for anticoagulation given hematuria/anemia -has been in sinus rhythm with PACs on telemetry  Acute kidney injury Anemia, hematuria -Scr 2.03>>2.1>>3.35 >>3.02 today -pending treatment for hydronephrosis -Otherwise per primary/nephrology  For questions or updates, please contact [facility] Please consult [email or web address] for contact info under      Signed, Buford Dresser, MD  01/26/2020, 9:34 AM

## 2020-01-26 NOTE — Anesthesia Preprocedure Evaluation (Addendum)
Anesthesia Evaluation  Patient identified by MRN, date of birth, ID band Patient awake    Reviewed: Allergy & Precautions, NPO status , Patient's Chart, lab work & pertinent test results  Airway Mallampati: III  TM Distance: >3 FB Neck ROM: Full    Dental  (+) Edentulous Upper, Edentulous Lower   Pulmonary Current Smoker and Patient abstained from smoking.,    Pulmonary exam normal breath sounds clear to auscultation       Cardiovascular hypertension, + CAD, + Cardiac Stents (DES 2007; 2019 on ASA only) and +CHF  + dysrhythmias (h/o aflutter s/p ablation)  Rhythm:Regular Rate:Normal  01/25/2020 1. Left ventricular ejection fraction, by estimation, is 30 to 35%. The  left ventricle has moderately decreased function. The left ventricle  demonstrates global hypokinesis. The left ventricular internal cavity size  was mildly dilated. Left ventricular  diastolic parameters are consistent with Grade I diastolic dysfunction  (impaired relaxation). Elevated left atrial pressure.  2. Right ventricular systolic function is normal. The right ventricular  size is normal.  3. The mitral valve is normal in structure. Mild mitral valve  regurgitation. No evidence of mitral stenosis.  4. The aortic valve is tricuspid. Aortic valve regurgitation is not  visualized. Mild aortic valve sclerosis is present, with no evidence of  aortic valve stenosis.  5. The inferior vena cava is normal in size with greater than 50%  respiratory variability, suggesting right atrial pressure of 3 mmHg.    Neuro/Psych TIAnegative psych ROS   GI/Hepatic negative GI ROS,   Endo/Other  diabetes, Type 2  Renal/GU ARF and Renal InsufficiencyRenal disease   Bladder cancer    Musculoskeletal  (+) Arthritis ,   Abdominal Normal abdominal exam  (+)   Peds  Hematology  (+) anemia , hgb 9.1 up from 7.3   Anesthesia Other Findings    Reproductive/Obstetrics negative OB ROS                           Anesthesia Physical Anesthesia Plan  ASA: IV and emergent  Anesthesia Plan: General   Post-op Pain Management:    Induction:   PONV Risk Score and Plan: 1  Airway Management Planned: LMA  Additional Equipment:   Intra-op Plan:   Post-operative Plan: Extubation in OR  Informed Consent: I have reviewed the patients History and Physical, chart, labs and discussed the procedure including the risks, benefits and alternatives for the proposed anesthesia with the patient or authorized representative who has indicated his/her understanding and acceptance.       Plan Discussed with: Anesthesiologist and CRNA  Anesthesia Plan Comments:       Anesthesia Quick Evaluation

## 2020-01-26 NOTE — Anesthesia Procedure Notes (Signed)
Procedure Name: Intubation Date/Time: 01/26/2020 10:23 AM Performed by: Raenette Rover, CRNA Pre-anesthesia Checklist: Patient identified, Emergency Drugs available, Suction available and Patient being monitored Patient Re-evaluated:Patient Re-evaluated prior to induction Oxygen Delivery Method: Circle system utilized Preoxygenation: Pre-oxygenation with 100% oxygen Induction Type: IV induction and Rapid sequence Ventilation: Mask ventilation without difficulty Laryngoscope Size: Miller and 3 Grade View: Grade I Tube type: Oral Tube size: 7.5 mm Number of attempts: 1 Airway Equipment and Method: Stylet Placement Confirmation: ETT inserted through vocal cords under direct vision,  positive ETCO2 and breath sounds checked- equal and bilateral Secured at: 21 cm Tube secured with: Tape Dental Injury: Teeth and Oropharynx as per pre-operative assessment

## 2020-01-26 NOTE — Progress Notes (Signed)
PROGRESS NOTE    Richard [name]  IFO:[id number] DOB: 05-25-1945 DOA: 01/23/2020 PCP: Wannetta Sender, FNP   Chief Complaint  Patient presents with  . Jaw Pain  Brief Narrative:   59yom with T2DM, HTN, CAD with history of stent/CHF, HLD, A-flutter, bladder cancer, chronic hematuria followed by Dr. Alyson Ingles from urology who has had multiple blood clots and hematuria in his Foley catheter and also developed chest pain between the shoulder blades with radiation to the jaw relieved with nitroglycerin and was seen in the ED at any pain, blood work showed hyperglycemia creatinine 2.0 hemoglobin 7.1 troponin positive at 29 cardiology, urology was consulted and was admitted at [facility]. per report for the last several weeks, patient was having progressively worsening and difficult to control hematuria. He was seen by urologist Dr. Alyson Ingles in the office on 11/30, had a cystoscopy done.  But unfortunately there was too much blood present for any treatment.  Foley catheter was placed. He was discharged home with arrangements made for him to have a cystoscopy done under anesthesia on 12/2. The patient has been experiencing multiple blood clots and requires frequent flushing of the Foley catheter  At [facility] Patient was being treated for acute blood loss anemia/hematuria/ACS with slightly positive troponin, unable to be treated with heparin due to ongoing hematuria.  He has been on continuous bladder irrigation. He also was having issue with hypoxia and was seen by PCCM.  12/4 patient was transferred to [facility] for his ongoing neurological follow-up and procedure. 12/4-Underwent cystoscopy, with transurethral resection of bladder tumor large, clot evacuation bilateral retrograde pyelogram with right for ureteral stent removal-and on the left with Foley catheter off CBI.  Subjective:  This morning seen in [facility] with small amount of bloody urine.  He is on therapy nasal cannula saturating 96%  has received fentanyl for pain, while still complaining of pain. Alert awake, denies any chest pain nausea vomiting.   Assessment & Plan:  Large Multifocal bladder tumor, with ongoing hematuria: Had been on CBI, off CBI and  on 12/421 am-Underwent cystoscopy, with transurethral resection of bladder tumor large, clot evacuation bilateral retrograde pyelogram with right for ureteral stent removal-and on the left with Foley catheter.  Social urology,continue Foley, continue supportive care and hydration, likely need radiation therapy and radio onc has been consulted.  Unstable angina with known CAD with prior intervention, troponin slightly positive flat 29-30-39: Currently not further chest pain, not candidate for catheter antiplatelet therapy due to his ongoing hematuria and anemia.  Per cardiology recommendation who is following on board, continue statin, beta-blocker Imdur .  Patient normally on Toprol 75, Imdur 30, Entresto 49/81, torsemide 20 mg and amlodipine 2.5 mg daily at home.  Chronic systolic and diastolic CHF Echo this admission with EF 30 to 35%, global hypokinesia and grade 1 DD, EF was 25 to 30% on 12/2019 and 60 to 65% on July 2021.  No ACE ARB Aldactone Entresto due to renal failure, continue beta-blocker watch for fluid overload.  History of paroxysmal atrial fibrillation status post prior ablation not a candidate for anticoagulation, and sinus rhythm.  AKI on ckd stage IIIa: baseline creat 1.2 in sept 2021-was seen 1.72 previously in July 2021. multifactorial with blood loss anemia, hydronephrosis bilateral seen in the CT abdomen due to urinary blood clots and bladder tumor.  Slowly getting better hopefully after cystoscopy and blood clot evacuation renal function improved.  Monitor.  Recent Labs  Lab 01/23/20 [id number] 01/24/20 1011 01/25/20 2094  01/26/20 0317  BUN 46* 42* 44* 52*  CREATININE 2.03* 2.10* 3.35* 3.02*   Hyperkalemia/hypomagnesemia monitor electrolytes. k  Downtrending.  Acute hypoxic respiratory failure/chronic tobacco abuse: Multifactorial: CHF, atelectasis and from sedation.  Likely has underlying pulmonary disease due to his longstanding smoking history 54 pack years, has mild wheezing on expiration.  Add bronchodilators  prn .Currently on 3l nasal cannula, saturating well denies chest pain.  Minimize narcotics/opiates, as needed Narcan.  Pulmonary has seen the patient and discussed Dr. Lake Bells, seen the patient in PACU no further recommendation  Acute on chronic blood loss anemia with acute and chronic hematuria: Patient IS S/P 2 u PRBC 12/1.  Monitor H&H transfuse if less than 7 g. Recent Labs  Lab 01/23/20 2024 01/24/20 1011 01/25/20 0311 01/25/20 1405 01/26/20 0317  HGB 8.6* 9.2* 8.5* 7.3* 9.1*  HCT 26.4* 28.8* 25.9* 23.6* 28.7*   T2DM with uncontrolled hyperglycemia, hemoglobin A1c 9.612/1 dose increased to 10 units in the morning, continue sliding scale and monitor. Recent Labs  Lab 01/25/20 0540 01/25/20 1150 01/25/20 1542 01/25/20 2205 01/26/20 0718  GLUCAP 282* 180* 169* 118* 142*   Essential hypertension: Blood pressure is on higher side.  Continue amlodipine, imdur and metoprolol.   HLD-statins  Active ongoing smoking discussed about cessation.  Stenosis of left carotid artery-cont statins  ZOX:WRUE CODE  Nutrition: Diet Order            Diet regular Room service appropriate? Yes; Fluid consistency: Thin  Diet effective now                Body mass index is 24.28 kg/m.  DVT prophylaxis: SCDs Start: 01/23/20 1455 Code Status:   Code Status: Full Code  Family Communication: plan of care discussed with patient at bedside. I have discussed with pulmonary, nursing staff.  Status is: Inpatient Remains inpatient appropriate because:IV treatments appropriate due to intensity of illness or inability to take PO and Inpatient level of care appropriate due to severity of illness  Dispo: The patient is from:  Home              Anticipated d/c is to: Home              Anticipated d/c date is: 3 days              Patient currently is not medically stable to d/c.   Consultants: Cardiology, urology, pulmonary.   Procedures:see note  Culture/Microbiology    Component Value Date/Time   SDES  01/15/2020 1533    URINE, CLEAN CATCH Performed at [facility], [address], [place], [place] [address]    [facility]  01/15/2020 1533    NONE Performed at [facility], [address] [place], [place] [address]    CULT  01/15/2020 1533    NO GROWTH Performed at [facility] [address], [place], [place] [address]    REPTSTATUS 01/17/2020 FINAL 01/15/2020 1533    Other culture-see note  Medications: Scheduled Meds: . amLODipine  2.5 mg Oral Daily  . atorvastatin  10 mg Oral Daily  . Chlorhexidine Gluconate Cloth  6 each Topical Daily  . fentaNYL      . insulin aspart  0-5 Units Subcutaneous QHS  . insulin aspart  0-9 Units Subcutaneous TID WC  . insulin aspart  2 Units Subcutaneous TID WC  . insulin glargine  10 Units Subcutaneous Daily  . isosorbide mononitrate  30 mg Oral QHS  . metoprolol tartrate  50 mg  Oral BID  . mirabegron ER  25 mg Oral Daily   Continuous Infusions: . sodium chloride 10 mL/hr at 01/25/20 1251  . acetaminophen      Antimicrobials: Anti-infectives (From admission, onward)   Start     Dose/Rate Route Frequency Ordered Stop   01/26/20 1008  ceFAZolin (ANCEF) 2-4 GM/100ML-% IVPB       Note to Pharmacy: Valinda Party   : cabinet override      01/26/20 1008 01/26/20 1045   01/24/20 0935  ceFAZolin (ANCEF) IVPB 2g/100 mL premix        2 g 200 mL/hr over 30 Minutes Intravenous 30 min pre-op 01/24/20 0935 01/26/20 1109     Objective: Vitals: Today's Vitals   01/26/20 1150 01/26/20 1200 01/26/20 1215 01/26/20 1230  BP: (!) 172/82 (!) 170/91 (!) 159/89 (!) 154/84  Pulse: 86 80 79 80  Resp: 14 20 19 20   Temp: 97.8 F (36.6 C)     TempSrc:       SpO2: 96% 100% 97% 95%  Weight:      Height:      PainSc: 7  8  7       Intake/Output Summary (Last 24 hours) at 01/26/2020 1330 Last data filed at 01/26/2020 1134 Gross per 24 hour  Intake 10240 ml  Output 7100 ml  Net 3140 ml   Filed Weights   01/25/20 0510 01/26/20 0022 01/26/20 0400  Weight: 82.4 kg 81.2 kg 81.2 kg   Weight change: -1.173 kg  Intake/Output from previous day: 12/03 0701 - 12/04 0700 In: 48250 [I.V.:340; Blood:300; IV Piggyback:100] Out: 16400 [Urine:16400] Intake/Output this shift: Total I/O In: 900 [I.V.:800; IV Piggyback:100] Out: 400 [Urine:400]  Examination: General exam: AAOx3 ,NAD, weak appearing. HEENT:Oral mucosa moist, Ear/Nose WNL grossly,dentition normal. Respiratory system: bilaterally mild wheezing on expiration,no use of accessory muscle, non tender. Cardiovascular system: S1 & S2 +, regular, No JVD. Gastrointestinal system: Abdomen soft, NT,ND, BS+. Nervous System:Alert, awake, moving extremities and grossly nonfocal Extremities: No edema, distal peripheral pulses palpable.  Skin: No rashes,no icterus. MSK: Normal muscle bulk,tone, power  Data Reviewed: I have personally reviewed following labs and imaging studies CBC: Recent Labs  Lab 01/23/20 0943 01/23/20 0943 01/23/20 2024 01/24/20 1011 01/25/20 0311 01/25/20 1405 01/26/20 0317  WBC 8.4  --   --  10.2 11.6* 9.1 12.2*  NEUTROABS  --   --   --  8.1*  --  7.0  --   HGB 7.1*   < > 8.6* 9.2* 8.5* 7.3* 9.1*  HCT 21.8*   < > 26.4* 28.8* 25.9* 23.6* 28.7*  MCV 87.6  --   --  87.3 88.4 88.4 88.9  PLT 300  --   --  287 252 213 199   < > = values in this interval not displayed.   Basic Metabolic Panel: Recent Labs  Lab 01/23/20 0943 01/24/20 1011 01/25/20 0311 01/26/20 0317  NA 133* 137 137 138  K 3.9 4.9 5.4* 5.2*  CL 97* 97* 99 103  CO2 26 28 23 27   GLUCOSE 254* 260* 267* 118*  BUN 46* 42* 44* 52*  CREATININE 2.03* 2.10* 3.35* 3.02*  CALCIUM 8.9 9.3 9.0 9.1  MG  --    --  1.6* 2.3   GFR: Estimated Creatinine Clearance: 23.6 mL/min (A) (by C-G formula based on SCr of 3.02 mg/dL (H)). Liver Function Tests: Recent Labs  Lab 01/25/20 0311 01/26/20 0317  AST 22 22  ALT 15 15  ALKPHOS 97 94  BILITOT 1.0 0.8  PROT 5.8* 5.5*  ALBUMIN 3.2* 2.9*   No results for input(s): LIPASE, AMYLASE in the last 168 hours. No results for input(s): AMMONIA in the last 168 hours. Coagulation Profile: No results for input(s): INR, PROTIME in the last 168 hours. Cardiac Enzymes: No results for input(s): CKTOTAL, CKMB, CKMBINDEX, TROPONINI in the last 168 hours. BNP (last 3 results) No results for input(s): PROBNP in the last 8760 hours. HbA1C: No results for input(s): HGBA1C in the last 72 hours. CBG: Recent Labs  Lab 01/25/20 0540 01/25/20 1150 01/25/20 1542 01/25/20 2205 01/26/20 0718  GLUCAP 282* 180* 169* 118* 142*   Lipid Profile: No results for input(s): CHOL, HDL, LDLCALC, TRIG, CHOLHDL, LDLDIRECT in the last 72 hours. Thyroid Function Tests: No results for input(s): TSH, T4TOTAL, FREET4, T3FREE, THYROIDAB in the last 72 hours. Anemia Panel: No results for input(s): VITAMINB12, FOLATE, FERRITIN, TIBC, IRON, RETICCTPCT in the last 72 hours. Sepsis Labs: No results for input(s): PROCALCITON, LATICACIDVEN in the last 168 hours.  Recent Results (from the past 240 hour(s))  Resp Panel by RT-PCR (Flu A&B, Covid) Nasopharyngeal Swab     Status: None   Collection Time: 01/23/20  1:02 PM   Specimen: Nasopharyngeal Swab; Nasopharyngeal(NP) swabs in vial transport medium  Result Value Ref Range Status   SARS Coronavirus 2 by RT PCR NEGATIVE NEGATIVE Final    Comment: (NOTE) SARS-CoV-2 target nucleic acids are NOT DETECTED.  The SARS-CoV-2 RNA is generally detectable in upper respiratory specimens during the acute phase of infection. The lowest concentration of SARS-CoV-2 viral copies this assay can detect is 138 copies/mL. A negative result does not  preclude SARS-Cov-2 infection and should not be used as the sole basis for treatment or other patient management decisions. A negative result may occur with  improper specimen collection/handling, submission of specimen other than nasopharyngeal swab, presence of viral mutation(s) within the areas targeted by this assay, and inadequate number of viral copies(<138 copies/mL). A negative result must be combined with clinical observations, patient history, and epidemiological information. The expected result is Negative.  Fact Sheet for Patients:  EntrepreneurPulse.com.au  Fact Sheet for Healthcare Providers:  IncredibleEmployment.be  This test is no t yet approved or cleared by the [place] FDA and  has been authorized for detection and/or diagnosis of SARS-CoV-2 by FDA under an Emergency Use Authorization (EUA). This EUA will remain  in effect (meaning this test can be used) for the duration of the COVID-19 declaration under Section 564(b)(1) of the Act, 21 U.S.C.section 360bbb-3(b)(1), unless the authorization is terminated  or revoked sooner.       Influenza A by PCR NEGATIVE NEGATIVE Final   Influenza B by PCR NEGATIVE NEGATIVE Final    Comment: (NOTE) The Xpert Xpress SARS-CoV-2/FLU/RSV plus assay is intended as an aid in the diagnosis of influenza from Nasopharyngeal swab specimens and should not be used as a sole basis for treatment. Nasal washings and aspirates are unacceptable for Xpert Xpress SARS-CoV-2/FLU/RSV testing.  Fact Sheet for Patients: EntrepreneurPulse.com.au  Fact Sheet for Healthcare Providers: IncredibleEmployment.be  This test is not yet approved or cleared by the [place] FDA and has been authorized for detection and/or diagnosis of SARS-CoV-2 by FDA under an Emergency Use Authorization (EUA). This EUA will remain in effect (meaning this test can be used) for the  duration of the COVID-19 declaration under Section 564(b)(1) of the Act, 21 U.S.C. section 360bbb-3(b)(1), unless the authorization is terminated or revoked.  Performed at [facility]  [facility], [address], [place], [place] [address]      Radiology Studies: CT ABDOMEN PELVIS WO CONTRAST  Result Date: 01/25/2020 CLINICAL DATA:  Bladder cancer.  Flank pain. EXAM: CT ABDOMEN AND PELVIS WITHOUT CONTRAST TECHNIQUE: Multidetector CT imaging of the abdomen and pelvis was performed following the standard protocol without IV contrast. COMPARISON:  September 16, 2019. FINDINGS: Lower chest: No acute abnormality. Hepatobiliary: No focal liver abnormality is seen. No gallstones, gallbladder wall thickening, or biliary dilatation. Pancreas: Unremarkable. No pancreatic ductal dilatation or surrounding inflammatory changes. Spleen: Normal in size without focal abnormality. Adrenals/Urinary Tract: Adrenal glands appear normal. Mild bilateral perinephric stranding is noted. Right ureteral stent is in grossly good position. Mild bilateral hydronephrosis is noted. Foley catheter is noted within distended urinary bladder. Large amount of blood clot is noted within the bladder lumen. Mild wall thickening is seen involving the left side of the urinary bladder likely reflecting postoperative change. Stomach/Bowel: Stomach is within normal limits. Appendix appears normal. No evidence of bowel wall thickening, distention, or inflammatory changes. Vascular/Lymphatic: Aortic atherosclerosis. No enlarged abdominal or pelvic lymph nodes. Reproductive: Prostate is unremarkable. Other: Mild anasarca is noted. Small fluid-filled left inguinal hernia is noted. Musculoskeletal: No acute or significant osseous findings. IMPRESSION: 1. Right ureteral stent is in grossly good position. Mild bilateral hydronephrosis is noted. 2. Large amount of blood clot is noted within the bladder lumen. Mild wall thickening is seen involving the left side of the  urinary bladder likely reflecting postoperative change. 3. Mild anasarca is noted. 4. Small fluid-filled left inguinal hernia. Aortic Atherosclerosis (ICD10-I70.0). Electronically Signed   By: Marijo Conception M.D.   On: 01/25/2020 09:34   DG Chest Port 1 View  Result Date: 01/25/2020 CLINICAL DATA:  Abnormal respiration, LEFT flank pain EXAM: PORTABLE CHEST 1 VIEW COMPARISON:  Portable exam 1233 hours compared to 01/23/2020 FINDINGS: Upper normal heart size with pulmonary vascular congestion. Mediastinal contours normal. Atherosclerotic calcification aorta. Minimal RIGHT basilar atelectasis. Lungs is otherwise clear. No pulmonary infiltrate, pleural effusion, or pneumothorax. Prior cervical spine fusion. IMPRESSION: Minimal RIGHT basilar atelectasis. Aortic Atherosclerosis (ICD10-I70.0). Electronically Signed   By: Lavonia Dana M.D.   On: 01/25/2020 13:00   DG C-Arm 1-60 Min-No Report  Result Date: 01/26/2020 Fluoroscopy was utilized by the requesting physician.  No radiographic interpretation.     LOS: 3 days   Antonieta Pert, MD [facility]  01/26/2020, 1:30 PM

## 2020-01-27 ENCOUNTER — Encounter (HOSPITAL_COMMUNITY): Payer: Self-pay | Admitting: Urology

## 2020-01-27 DIAGNOSIS — N179 Acute kidney failure, unspecified: Secondary | ICD-10-CM | POA: Diagnosis not present

## 2020-01-27 DIAGNOSIS — R310 Gross hematuria: Secondary | ICD-10-CM | POA: Diagnosis not present

## 2020-01-27 DIAGNOSIS — I429 Cardiomyopathy, unspecified: Secondary | ICD-10-CM

## 2020-01-27 DIAGNOSIS — I208 Other forms of angina pectoris: Secondary | ICD-10-CM | POA: Diagnosis not present

## 2020-01-27 LAB — COMPREHENSIVE METABOLIC PANEL
ALT: 14 U/L (ref 0–44)
AST: 21 U/L (ref 15–41)
Albumin: 2.8 g/dL — ABNORMAL LOW (ref 3.5–5.0)
Alkaline Phosphatase: 118 U/L (ref 38–126)
Anion gap: 12 (ref 5–15)
BUN: 65 mg/dL — ABNORMAL HIGH (ref 8–23)
CO2: 23 mmol/L (ref 22–32)
Calcium: 8.4 mg/dL — ABNORMAL LOW (ref 8.9–10.3)
Chloride: 100 mmol/L (ref 98–111)
Creatinine, Ser: 2.35 mg/dL — ABNORMAL HIGH (ref 0.61–1.24)
GFR, Estimated: 28 mL/min — ABNORMAL LOW (ref >60)
Glucose, Bld: 389 mg/dL — ABNORMAL HIGH (ref 70–99)
Potassium: 5.9 mmol/L — ABNORMAL HIGH (ref 3.5–5.1)
Sodium: 135 mmol/L (ref 135–145)
Total Bilirubin: 0.4 mg/dL (ref 0.3–1.2)
Total Protein: 5.5 g/dL — ABNORMAL LOW (ref 6.5–8.1)

## 2020-01-27 LAB — GLUCOSE, CAPILLARY
Glucose-Capillary: 320 mg/dL — ABNORMAL HIGH (ref 70–99)
Glucose-Capillary: 360 mg/dL — ABNORMAL HIGH (ref 70–99)
Glucose-Capillary: 326 mg/dL — ABNORMAL HIGH (ref 70–99)
Glucose-Capillary: 135 mg/dL — ABNORMAL HIGH (ref 70–99)
Glucose-Capillary: 76 mg/dL (ref 70–99)
Glucose-Capillary: 159 mg/dL — ABNORMAL HIGH (ref 70–99)

## 2020-01-27 LAB — CBC
HCT: 25.9 % — ABNORMAL LOW (ref 39.0–52.0)
Hemoglobin: 8.5 g/dL — ABNORMAL LOW (ref 13.0–17.0)
MCH: 28.8 pg (ref 26.0–34.0)
MCHC: 32.8 g/dL (ref 30.0–36.0)
MCV: 87.8 fL (ref 80.0–100.0)
Platelets: 50 10*3/uL — ABNORMAL LOW (ref 150–400)
RBC: 2.95 MIL/uL — ABNORMAL LOW (ref 4.22–5.81)
RDW: 14.6 % (ref 11.5–15.5)
WBC: 11.4 10*3/uL — ABNORMAL HIGH (ref 4.0–10.5)
nRBC: 0 % (ref 0.0–0.2)

## 2020-01-27 MED ORDER — ALBUTEROL SULFATE HFA 108 (90 BASE) MCG/ACT IN AERS
2.0000 | INHALATION_SPRAY | RESPIRATORY_TRACT | Status: DC | PRN
  Administered 2020-01-28: 2 via RESPIRATORY_TRACT
  Filled 2020-01-27 (×2): qty 6.7

## 2020-01-27 MED ORDER — INSULIN ASPART 100 UNIT/ML ~~LOC~~ SOLN
4.0000 [IU] | Freq: Three times a day (TID) | SUBCUTANEOUS | Status: DC
  Administered 2020-01-27 (×2): 4 [IU] via SUBCUTANEOUS

## 2020-01-27 MED ORDER — INSULIN GLARGINE 100 UNIT/ML ~~LOC~~ SOLN
20.0000 [IU] | Freq: Every day | SUBCUTANEOUS | Status: DC
  Administered 2020-01-27: 20 [IU] via SUBCUTANEOUS
  Filled 2020-01-27 (×2): qty 0.2

## 2020-01-27 MED ORDER — SODIUM POLYSTYRENE SULFONATE 15 GM/60ML PO SUSP
30.0000 g | Freq: Once | ORAL | Status: AC
  Administered 2020-01-27: 30 g via ORAL
  Filled 2020-01-27: qty 120

## 2020-01-27 MED ORDER — METOPROLOL SUCCINATE ER 100 MG PO TB24
100.0000 mg | ORAL_TABLET | Freq: Every day | ORAL | Status: DC
  Administered 2020-01-28 – 2020-01-29 (×2): 100 mg via ORAL
  Filled 2020-01-27 (×2): qty 1

## 2020-01-27 NOTE — Evaluation (Signed)
Physical Therapy Evaluation Patient Details Name: Richard [name] MRN: [id number] DOB: October 12, 1945 Today's Date: 01/27/2020   History of Present Illness  74 yo male admitted with chest pain. s/p TURBT, clot evacuation, R ureteral stent removal 01/26/20. Hx of bladder tumor, Afib/Aflutter, DDD, DM, TIA, CAD, CHF, CKD, anemia  Clinical Impression  On eval, pt required Min assist for mobility. He walked ~115 feet in the hallway with handrail for support. Pt is very unsteady. He is also weak and fatigues fairly easily. O2 94% on RA at rest, 89% on RA during ambulation. Moderate pain during session-rated 7/10-RN medicated pt during session. Discussed d/c plan-pt plans to return home. His wife cannot provide any physical assistance. Pt's son was present during eval and he stated he could help out some. Will continue to follow and progress activity as tolerated. Will recommend HHPT and [place] aide if possible. Pt stated he would like to discuss [place] f/u with his wife 1st.     Follow Up Recommendations Home health PT;Supervision/Assistance - 24 hour (Blue, if possible)    Equipment Recommendations  None recommended by PT    Recommendations for Other Services       Precautions / Restrictions Precautions Precautions: Fall Restrictions Weight Bearing Restrictions: No      Mobility  Bed Mobility Overal bed mobility: Needs Assistance Bed Mobility: Supine to Sit;Sit to Supine     Supine to sit: Min guard;HOB elevated Sit to supine: Min guard;HOB elevated        Transfers Overall transfer level: Needs assistance Equipment used: Rolling walker (2 wheeled) Transfers: Sit to/from Stand Sit to Stand: Min assist         General transfer comment: Assist to steady.  Ambulation/Gait Ambulation/Gait assistance: Min assist Gait Distance (Feet): 115 Feet Assistive device:  (hallway handrail) Gait Pattern/deviations: Step-through pattern;Decreased stride length     General Gait  Details: Unsteady. Required assistance througout distance. Took some steps without UE support. O2 89% on RA, dyspnea 2/4  Stairs            Wheelchair Mobility    Modified Rankin (Stroke Patients Only)       Balance Overall balance assessment: Needs assistance           Standing balance-Leahy Scale: Poor                               Pertinent Vitals/Pain Pain Assessment: 0-10 Pain Score: 7  Pain Location: lower abdomen Pain Descriptors / Indicators: Discomfort;Guarding;Sore Pain Intervention(s): Monitored during session    Home Living Family/patient expects to be discharged to:: Private residence Living Arrangements: Spouse/significant other Available Help at Discharge: Family Type of Home: House Home Access: Stairs to enter;Ramped entrance Entrance Stairs-Rails: None Entrance Stairs-Number of Steps: 2 Home Layout: One level Home Equipment: Cane - single point Additional Comments: + assorted equipment from other family members- "I got it all"    Prior Function Level of Independence: Independent         Comments: retired Administrator; garden     Journalist, newspaper   Dominant Hand: Right    Extremity/Trunk Assessment   Upper Extremity Assessment Upper Extremity Assessment: Defer to OT evaluation    Lower Extremity Assessment Lower Extremity Assessment: Generalized weakness    Cervical / Trunk Assessment Cervical / Trunk Assessment: Normal  Communication   Communication: No difficulties  Cognition Arousal/Alertness: Awake/alert Behavior During Therapy: WFL for tasks assessed/performed Overall Cognitive  Status: Within Functional Limits for tasks assessed                                        General Comments      Exercises     Assessment/Plan    PT Assessment Patient needs continued PT services  PT Problem List Decreased strength;Decreased mobility;Decreased activity tolerance;Decreased balance;Decreased  knowledge of use of DME;Pain       PT Treatment Interventions DME instruction;Gait training;Therapeutic activities;Therapeutic exercise;Patient/family education;Balance training;Functional mobility training    PT Goals (Current goals can be found in the Care Plan section)  Acute Rehab PT Goals Patient Stated Goal: less pain. get stronger PT Goal Formulation: With patient Time For Goal Achievement: 02/10/20 Potential to Achieve Goals: Good    Frequency Min 3X/week   Barriers to discharge   wife cannot physically assist but son wasd present and stated he could help some at home    Co-evaluation               AM-PAC PT "6 Clicks" Mobility  Outcome Measure Help needed turning from your back to your side while in a flat bed without using bedrails?: A Little Help needed moving from lying on your back to sitting on the side of a flat bed without using bedrails?: A Little Help needed moving to and from a bed to a chair (including a wheelchair)?: A Little Help needed standing up from a chair using your arms (e.g., wheelchair or bedside chair)?: A Little Help needed to walk in hospital room?: A Little Help needed climbing 3-5 steps with a railing? : A Little 6 Click Score: 18    End of Session Equipment Utilized During Treatment: Gait belt Activity Tolerance: Patient limited by fatigue;Patient limited by pain Patient left: in bed;with call bell/phone within reach;with bed alarm set   PT Visit Diagnosis: Unsteadiness on feet (R26.81);Muscle weakness (generalized) (M62.81);Pain Pain - part of body:  (abdomen)    Time: 9678-9381 PT Time Calculation (min) (ACUTE ONLY): 17 min   Charges:   PT Evaluation $PT Eval Moderate Complexity: 1 Mod             Doreatha Massed, PT Acute Rehabilitation  Office: [phone] Pager: [phone]

## 2020-01-27 NOTE — Progress Notes (Signed)
0300 CBG reading =320. No coverage listed on MAR for 0300. I notified the on-call provider. No new orders.

## 2020-01-27 NOTE — Progress Notes (Signed)
PROGRESS NOTE    Richard [name]  XBL:[id number] DOB: 1945-05-27 DOA: 01/23/2020 PCP: Wannetta Sender, FNP   Chief Complaint  Patient presents with  . Jaw Pain  Brief Narrative:   36yom with T2DM, HTN, CAD with history of stent/CHF, HLD, A-flutter, bladder cancer, chronic hematuria followed by Dr. Alyson Ingles from urology who has had multiple blood clots and hematuria in his Foley catheter and also developed chest pain between the shoulder blades with radiation to the jaw relieved with nitroglycerin and was seen in the ED at any pain, blood work showed hyperglycemia creatinine 2.0 hemoglobin 7.1 troponin positive at 29 cardiology, urology was consulted and was admitted at [facility].  Per report for the last several weeks, patient was having progressively worsening and difficult to control hematuria. He was seen by urologist Dr. Alyson Ingles in the office on 11/30, had a cystoscopy done.  But unfortunately there was too much blood present for any treatment.  Foley catheter was placed. He was discharged home with arrangements made for him to have a cystoscopy done under anesthesia on 12/2. The patient has been experiencing multiple blood clots and requires frequent flushing of the Foley catheter  At [facility] Patient was being treated for acute blood loss anemia/hematuria/ACS with slightly positive troponin, unable to be treated with heparin due to ongoing hematuria.  He has been on continuous bladder irrigation. He also was having issue with hypoxia and was seen by PCCM.  12/4 patient was transferred to [facility] for his ongoing neurological follow-up and procedure. 12/4-Underwent cystoscopy, with transurethral resection of bladder tumor large, clot evacuation bilateral retrograde pyelogram with right for ureteral stent removal-and on the left with Foley catheter off CBI.  Subjective: Seen this morning resting comfortably denies chest pain shortness of breath. Blood sugar poorly controlled  overnight. Blood pressure stable.  No acute events k- 5.9 creatinine improving 2.3 WBC downtrending. Foley with dark urine clear  Assessment & Plan:  Large Multifocal bladder tumor, with significant hematuria leading to acute blood loss anemia: Had been on CBI- transferred to [facility] from [facility] for urology pan- 12/4 s/p cystoscopy, with transurethral resection of bladder tumor large, clot evacuation bilateral retrograde pyelogram with right for ureteral stent removal-and on the left with Foley catheter.  Continue Foley catheter, UOP 1050 ml.?  Radiation consult for urology, noted plan for Foley removal and voiding trial today.    Unstable angina with known CAD with prior intervention, troponin slightly positive flat 29-30-39: Currently not further chest pain, not candidate for cardiac cath/antiplatelet therapy due to his ongoing hematuria/bladder tumor/anemia.  Continue with statin, beta-blocker Imdur as per cardiology, appreciate input, following closely. Patient normally on Toprol 75, Imdur 30, Entresto 49/81, torsemide 20 mg and amlodipine 2.5 mg daily at home.  Chronic systolic and diastolic CHF Echo this admission with EF 30 to 35%, global hypokinesia and grade 1 DD, EF was 25 to 30% on 12/2019 and 60 to 65% on July 2021.  No ACE ARB Aldactone Entresto due to renal failure.  Not on continuous IV fluids, taking p.o. well, creatinine downtrending.  Watch for volume status closely.  Per I/O charting total net -6.6 L, although weight is as 170>169?181>> 179 lb.  History of paroxysmal atrial fibrillation status post prior ablation not a candidate for anticoagulation. In NSR  AKI on ckd stage IIIa: baseline creat 1.2 in sept 2021-1.72 previously in July 2021. multifactorial with blood loss anemia, hydronephrosis-obstructive nephropathy due to urinary blood clots and bladder tumor.  Creatinine is  downtrending, Foley is draining.  Monitor renal function closely.  Recent Labs  Lab 01/23/20 0943 01/24/20 1011  01/25/20 0311 01/26/20 0317 01/27/20 0622  BUN 46* 42* 44* 52* 65*  CREATININE 2.03* 2.10* 3.35* 3.02* 2.35*   Hyperkalemia: K is up at 5.9, I will dose with Kayexalate since it is uptrending.  Likely worsened by his hyperglycemia as well.  Acute hypoxic respiratory failure/chronic tobacco abuse: Multifactorial: CHF, atelectasis and from sedation.  Likely has underlying pulmonary disease due to his longstanding smoking history 54 pack years.  Will continue supplemental oxygen, as needed bronchodilators, appreciate pulmonary input on board.  Minimize sedation.  Patient is however requiring pain control with opiates.  Wean oxygen.  Acute on chronic blood loss anemia with acute and chronic hematuria: Patient IS S/P 2 u PRBC 12/1.  Monitor and transfuse if less than 7 g. Recent Labs  Lab 01/24/20 1011 01/25/20 0311 01/25/20 1405 01/26/20 0317 01/27/20 0622  HGB 9.2* 8.5* 7.3* 9.1* 8.5*  HCT 28.8* 25.9* 23.6* 28.7* 25.9*   T2DM with uncontrolled hyperglycemia, hemoglobin A1c 9.612/1-increase Lantus further to 20 units and Premeal insulin to 4 units, continue sliding scale.  Normally is on Metformin/glipizide at home Recent Labs  Lab 01/26/20 1611 01/26/20 2057 01/27/20 0230 01/27/20 0655 01/27/20 0732  GLUCAP 235* 282* 320* 360* 326*   Essential hypertension: BP stable, continue current amlodipine, imdur and metoprolol.   HLD-continue statin Active ongoing smoking discussed about cessation. Stenosis of left carotid artery-cont statins  BTD:VVOH CODE.  With multiple medical comorbidities prognosis remains to be seen, palliative care has been consulted advised after meeting to continue current scope of treatment, full code and outpatient palliative care follow-up.  Nutrition: Diet Order            Diet Carb Modified Fluid consistency: Thin; Room service appropriate? Yes  Diet effective now                Body mass index is 24.28 kg/m.  DVT prophylaxis: SCDs Start:  01/23/20 1455 Code Status:   Code Status: Full Code  Family Communication: plan of care discussed with patient at bedside. I have discussed with pulmonary, nursing staff.  Status is: Inpatient Remains inpatient appropriate because:IV treatments appropriate due to intensity of illness or inability to take PO and Inpatient level of care appropriate due to severity of illness  Dispo: The patient is from: Home              Anticipated d/c is to: Home vs SNF-we will obtain PT/OT eval.              Anticipated d/c date is 2 days              Patient currently is not medically stable to d/c.  Consultants: Cardiology, urology, pulmonary.   Procedures:see note  Culture/Microbiology    Component Value Date/Time   SDES  01/15/2020 1533    URINE, CLEAN CATCH Performed at [facility], [address] [place], [place] [address]    [facility]  01/15/2020 1533    NONE Performed at [facility], [address], [place], [place] [address]    CULT  01/15/2020 1533    NO GROWTH Performed at [place] [address], [place], [place] [address]    REPTSTATUS 01/17/2020 FINAL 01/15/2020 1533    Other culture-see note  Medications: Scheduled Meds: . amLODipine  2.5 mg Oral Daily  . atorvastatin  10 mg Oral Daily  . Chlorhexidine Gluconate Cloth  6 each Topical Daily  . insulin aspart  0-5 Units Subcutaneous QHS  . insulin aspart  0-9 Units Subcutaneous TID WC  . insulin aspart  4 Units Subcutaneous TID WC  . insulin glargine  20 Units Subcutaneous Daily  . isosorbide mononitrate  30 mg Oral QHS  . metoprolol tartrate  50 mg Oral BID   Continuous Infusions: . sodium chloride 10 mL/hr at 01/25/20 1251    Antimicrobials: Anti-infectives (From admission, onward)   Start     Dose/Rate Route Frequency Ordered Stop   01/26/20 1008  ceFAZolin (ANCEF) 2-4 GM/100ML-% IVPB       Note to Pharmacy: Valinda Party   : cabinet override      01/26/20 1008 01/26/20 1045   01/24/20 0935   ceFAZolin (ANCEF) IVPB 2g/100 mL premix        2 g 200 mL/hr over 30 Minutes Intravenous 30 min pre-op 01/24/20 0935 01/26/20 1109     Objective: Vitals: Today's Vitals   01/27/20 0800 01/27/20 0846 01/27/20 0847 01/27/20 0946  BP:   131/76   Pulse:   (!) 57   Resp:   12   Temp:   97.6 F (36.4 C)   TempSrc:   Oral   SpO2:   100%   Weight:      Height:      PainSc: 0-No pain 7   4     Intake/Output Summary (Last 24 hours) at 01/27/2020 1116 Last data filed at 01/27/2020 0240 Gross per 24 hour  Intake 2240 ml  Output 651 ml  Net 1589 ml   Filed Weights   01/25/20 0510 01/26/20 0022 01/26/20 0400  Weight: 82.4 kg 81.2 kg 81.2 kg   Weight change:   Intake/Output from previous day: 12/04 0701 - 12/05 0700 In: 2340 [P.O.:1440; I.V.:800; IV Piggyback:100] Out: 1051 [Urine:1050; Stool:1] Intake/Output this shift: No intake/output data recorded.  Examination: General exam: AAOX3 , NAD, weak appearing. HEENT:Oral mucosa moist, Ear/Nose WNL grossly, dentition normal. Respiratory system: bilaterally mild wheezing with deep inspiration/expiration,no use of accessory muscle Cardiovascular system: S1 & S2 +, No JVD,. Gastrointestinal system: Abdomen soft, NT,ND, BS+ Nervous System:Alert, awake, moving extremities and grossly nonfocal Extremities: No edema, distal peripheral pulses palpable.  Skin: No rashes,no icterus. MSK: Normal muscle bulk,tone, power  Data Reviewed: I have personally reviewed following labs and imaging studies CBC: Recent Labs  Lab 01/24/20 1011 01/25/20 0311 01/25/20 1405 01/26/20 0317 01/27/20 0622  WBC 10.2 11.6* 9.1 12.2* 11.4*  NEUTROABS 8.1*  --  7.0  --   --   HGB 9.2* 8.5* 7.3* 9.1* 8.5*  HCT 28.8* 25.9* 23.6* 28.7* 25.9*  MCV 87.3 88.4 88.4 88.9 87.8  PLT 287 252 213 199 50*   Basic Metabolic Panel: Recent Labs  Lab 01/23/20 0943 01/24/20 1011 01/25/20 0311 01/26/20 0317 01/27/20 0622  NA 133* 137 137 138 135  K 3.9 4.9 5.4*  5.2* 5.9*  CL 97* 97* 99 103 100  CO2 26 28 23 27 23   GLUCOSE 254* 260* 267* 118* 389*  BUN 46* 42* 44* 52* 65*  CREATININE 2.03* 2.10* 3.35* 3.02* 2.35*  CALCIUM 8.9 9.3 9.0 9.1 8.4*  MG  --   --  1.6* 2.3  --    GFR: Estimated Creatinine Clearance: 30.3 mL/min (A) (by C-G formula based on SCr of 2.35 mg/dL (H)). Liver Function Tests: Recent Labs  Lab 01/25/20 0311 01/26/20 0317 01/27/20 0622  AST 22 22 21   ALT 15 15 14  ALKPHOS 97 94 118  BILITOT 1.0 0.8 0.4  PROT 5.8* 5.5* 5.5*  ALBUMIN 3.2* 2.9* 2.8*   No results for input(s): LIPASE, AMYLASE in the last 168 hours. No results for input(s): AMMONIA in the last 168 hours. Coagulation Profile: No results for input(s): INR, PROTIME in the last 168 hours. Cardiac Enzymes: No results for input(s): CKTOTAL, CKMB, CKMBINDEX, TROPONINI in the last 168 hours. BNP (last 3 results) No results for input(s): PROBNP in the last 8760 hours. HbA1C: No results for input(s): HGBA1C in the last 72 hours. CBG: Recent Labs  Lab 01/26/20 1611 01/26/20 2057 01/27/20 0230 01/27/20 0655 01/27/20 0732  GLUCAP 235* 282* 320* 360* 326*   Lipid Profile: No results for input(s): CHOL, HDL, LDLCALC, TRIG, CHOLHDL, LDLDIRECT in the last 72 hours. Thyroid Function Tests: No results for input(s): TSH, T4TOTAL, FREET4, T3FREE, THYROIDAB in the last 72 hours. Anemia Panel: No results for input(s): VITAMINB12, FOLATE, FERRITIN, TIBC, IRON, RETICCTPCT in the last 72 hours. Sepsis Labs: No results for input(s): PROCALCITON, LATICACIDVEN in the last 168 hours.  Recent Results (from the past 240 hour(s))  Resp Panel by RT-PCR (Flu A&B, Covid) Nasopharyngeal Swab     Status: None   Collection Time: 01/23/20  1:02 PM   Specimen: Nasopharyngeal Swab; Nasopharyngeal(NP) swabs in vial transport medium  Result Value Ref Range Status   SARS Coronavirus 2 by RT PCR NEGATIVE NEGATIVE Final    Comment: (NOTE) SARS-CoV-2 target nucleic acids are NOT  DETECTED.  The SARS-CoV-2 RNA is generally detectable in upper respiratory specimens during the acute phase of infection. The lowest concentration of SARS-CoV-2 viral copies this assay can detect is 138 copies/mL. A negative result does not preclude SARS-Cov-2 infection and should not be used as the sole basis for treatment or other patient management decisions. A negative result may occur with  improper specimen collection/handling, submission of specimen other than nasopharyngeal swab, presence of viral mutation(s) within the areas targeted by this assay, and inadequate number of viral copies(<138 copies/mL). A negative result must be combined with clinical observations, patient history, and epidemiological information. The expected result is Negative.  Fact Sheet for Patients:  EntrepreneurPulse.com.au  Fact Sheet for Healthcare Providers:  IncredibleEmployment.be  This test is no t yet approved or cleared by the [place] FDA and  has been authorized for detection and/or diagnosis of SARS-CoV-2 by FDA under an Emergency Use Authorization (EUA). This EUA will remain  in effect (meaning this test can be used) for the duration of the COVID-19 declaration under Section 564(b)(1) of the Act, 21 U.S.C.section 360bbb-3(b)(1), unless the authorization is terminated  or revoked sooner.       Influenza A by PCR NEGATIVE NEGATIVE Final   Influenza B by PCR NEGATIVE NEGATIVE Final    Comment: (NOTE) The Xpert Xpress SARS-CoV-2/FLU/RSV plus assay is intended as an aid in the diagnosis of influenza from Nasopharyngeal swab specimens and should not be used as a sole basis for treatment. Nasal washings and aspirates are unacceptable for Xpert Xpress SARS-CoV-2/FLU/RSV testing.  Fact Sheet for Patients: EntrepreneurPulse.com.au  Fact Sheet for Healthcare Providers: IncredibleEmployment.be  This test is not yet  approved or cleared by the [place] FDA and has been authorized for detection and/or diagnosis of SARS-CoV-2 by FDA under an Emergency Use Authorization (EUA). This EUA will remain in effect (meaning this test can be used) for the duration of the COVID-19 declaration under Section 564(b)(1) of the Act, 21 U.S.C. section 360bbb-3(b)(1), unless the authorization is  terminated or revoked.  Performed at [facility], [address] [place], [place] [address]      Radiology Studies: [facility] 1 View  Result Date: 01/25/2020 CLINICAL DATA:  Abnormal respiration, LEFT flank pain EXAM: PORTABLE CHEST 1 VIEW COMPARISON:  Portable exam 1233 hours compared to 01/23/2020 FINDINGS: Upper normal heart size with pulmonary vascular congestion. Mediastinal contours normal. Atherosclerotic calcification aorta. Minimal RIGHT basilar atelectasis. Lungs is otherwise clear. No pulmonary infiltrate, pleural effusion, or pneumothorax. Prior cervical spine fusion. IMPRESSION: Minimal RIGHT basilar atelectasis. Aortic Atherosclerosis (ICD10-I70.0). Electronically Signed   By: Lavonia Dana M.D.   On: 01/25/2020 13:00   DG C-Arm 1-60 Min-No Report  Result Date: 01/26/2020 Fluoroscopy was utilized by the requesting physician.  No radiographic interpretation.     LOS: 4 days   Antonieta Pert, MD [facility]  01/27/2020, 11:16 AM

## 2020-01-27 NOTE — Progress Notes (Signed)
1 Day Post-Op Subjective: OR yesterday for clot evac, TURBT, R stent removal Foley draining well with clear yellow urine Cr downtrending Hgb stable Pain controlled No nausea/vomiting on reg diet  Objective: Vital signs in last 24 hours: Temp:  [97.6 F (36.4 C)-98.3 F (36.8 C)] 97.6 F (36.4 C) (12/05 0847) Pulse Rate:  [56-86] 57 (12/05 0847) Resp:  [12-20] 12 (12/05 0847) BP: (118-172)/(53-91) 131/76 (12/05 0847) SpO2:  [95 %-100 %] 100 % (12/05 0847)  Intake/Output from previous day: 12/04 0701 - 12/05 0700 In: 2340 [P.O.:1440; I.V.:800; IV Piggyback:100] Out: 1051 [Urine:1050; Stool:1] Intake/Output this shift: No intake/output data recorded.  Physical Exam:  General: Alert and oriented CV: RRR Lungs: Clear Abdomen: Soft, ND GU: Foley in place draining clear yellow urine Ext: NT, No erythema  Lab Results: Recent Labs    01/25/20 1405 01/26/20 0317 01/27/20 0622  HGB 7.3* 9.1* 8.5*  HCT 23.6* 28.7* 25.9*   BMET Recent Labs    01/26/20 0317 01/27/20 0622  NA 138 135  K 5.2* 5.9*  CL 103 100  CO2 27 23  GLUCOSE 118* 389*  BUN 52* 65*  CREATININE 3.02* 2.35*  CALCIUM 9.1 8.4*     Studies/Results: CT ABDOMEN PELVIS WO CONTRAST  Result Date: 01/25/2020 CLINICAL DATA:  Bladder cancer.  Flank pain. EXAM: CT ABDOMEN AND PELVIS WITHOUT CONTRAST TECHNIQUE: Multidetector CT imaging of the abdomen and pelvis was performed following the standard protocol without IV contrast. COMPARISON:  September 16, 2019. FINDINGS: Lower chest: No acute abnormality. Hepatobiliary: No focal liver abnormality is seen. No gallstones, gallbladder wall thickening, or biliary dilatation. Pancreas: Unremarkable. No pancreatic ductal dilatation or surrounding inflammatory changes. Spleen: Normal in size without focal abnormality. Adrenals/Urinary Tract: Adrenal glands appear normal. Mild bilateral perinephric stranding is noted. Right ureteral stent is in grossly good position. Mild  bilateral hydronephrosis is noted. Foley catheter is noted within distended urinary bladder. Large amount of blood clot is noted within the bladder lumen. Mild wall thickening is seen involving the left side of the urinary bladder likely reflecting postoperative change. Stomach/Bowel: Stomach is within normal limits. Appendix appears normal. No evidence of bowel wall thickening, distention, or inflammatory changes. Vascular/Lymphatic: Aortic atherosclerosis. No enlarged abdominal or pelvic lymph nodes. Reproductive: Prostate is unremarkable. Other: Mild anasarca is noted. Small fluid-filled left inguinal hernia is noted. Musculoskeletal: No acute or significant osseous findings. IMPRESSION: 1. Right ureteral stent is in grossly good position. Mild bilateral hydronephrosis is noted. 2. Large amount of blood clot is noted within the bladder lumen. Mild wall thickening is seen involving the left side of the urinary bladder likely reflecting postoperative change. 3. Mild anasarca is noted. 4. Small fluid-filled left inguinal hernia. Aortic Atherosclerosis (ICD10-I70.0). Electronically Signed   By: Marijo Conception M.D.   On: 01/25/2020 09:34   DG Chest Port 1 View  Result Date: 01/25/2020 CLINICAL DATA:  Abnormal respiration, LEFT flank pain EXAM: PORTABLE CHEST 1 VIEW COMPARISON:  Portable exam 1233 hours compared to 01/23/2020 FINDINGS: Upper normal heart size with pulmonary vascular congestion. Mediastinal contours normal. Atherosclerotic calcification aorta. Minimal RIGHT basilar atelectasis. Lungs is otherwise clear. No pulmonary infiltrate, pleural effusion, or pneumothorax. Prior cervical spine fusion. IMPRESSION: Minimal RIGHT basilar atelectasis. Aortic Atherosclerosis (ICD10-I70.0). Electronically Signed   By: Lavonia Dana M.D.   On: 01/25/2020 13:00   DG C-Arm 1-60 Min-No Report  Result Date: 01/26/2020 Fluoroscopy was utilized by the requesting physician.  No radiographic interpretation.     Assessment/Plan: 74yo M with bladder  ca and large bladder tumor (not [facility] candidate due to medical comorbidities) who presented in clot retention, now s/p OR on 12/4 for clot evac, TURBT, removal of R ureteral stent. Doing well, AKI improving. Will attempt TOV today.   LOS: 4 days   Asiya Cutbirth 01/27/2020, 8:50 AM

## 2020-01-27 NOTE — Progress Notes (Signed)
Progress Note  Patient Name: Richard [name] Date of Encounter: 01/27/2020  [facility] Cardiologist: Rozann Lesches, MD   Subjective   S/p urology intervention yesterday. Pain is generally well controlled. Hopeful for trial of void soon. No chest pain or shortness of breath.  Inpatient Medications    Scheduled Meds: . amLODipine  2.5 mg Oral Daily  . atorvastatin  10 mg Oral Daily  . Chlorhexidine Gluconate Cloth  6 each Topical Daily  . insulin aspart  0-5 Units Subcutaneous QHS  . insulin aspart  0-9 Units Subcutaneous TID WC  . insulin aspart  4 Units Subcutaneous TID WC  . insulin glargine  20 Units Subcutaneous Daily  . isosorbide mononitrate  30 mg Oral QHS  . [START ON 01/28/2020] metoprolol succinate  100 mg Oral Daily  . metoprolol tartrate  50 mg Oral BID   Continuous Infusions: . sodium chloride 10 mL/hr at 01/25/20 1251   PRN Meds: acetaminophen **OR** acetaminophen, albuterol, bisacodyl, HYDROcodone-acetaminophen, HYDROmorphone (DILAUDID) injection, nitroGLYCERIN, ondansetron (ZOFRAN) IV   Vital Signs    Vitals:   01/26/20 2124 01/26/20 2330 01/27/20 0338 01/27/20 0847  BP: 121/79 119/68 125/67 131/76  Pulse: 61 (!) 56 64 (!) 57  Resp:  16 20 12   Temp:  97.8 F (36.6 C) 97.8 F (36.6 C) 97.6 F (36.4 C)  TempSrc:  Oral Oral Oral  SpO2:  100% 100% 100%  Weight:      Height:        Intake/Output Summary (Last 24 hours) at 01/27/2020 1251 Last data filed at 01/27/2020 0240 Gross per 24 hour  Intake 1440 ml  Output 651 ml  Net 789 ml   Last 3 Weights 01/26/2020 01/26/2020 01/25/2020  Weight (lbs) 179 lb 0.2 oz 179 lb 0.2 oz 181 lb 9.6 oz  Weight (kg) 81.2 kg 81.2 kg 82.373 kg      Telemetry    Sinus rhythm with PACs - Personally Reviewed  ECG    No new since 01/23/20  Physical Exam   GEN: Frail appearing, [place] in place, in no acute distress NECK: No JVD CARDIAC: regular rhythm, normal S1 and S2, no rubs or gallops. No murmur. VASCULAR:  Radial pulses 2+ bilaterally.  RESPIRATORY:  Clear to auscultation without rales, wheezing or rhonchi  ABDOMEN: Soft, non-tender, non-distended MUSCULOSKELETAL:  Moves all 4 limbs independently SKIN: Warm and dry, no edema today NEUROLOGIC:  No focal neuro deficits noted. PSYCHIATRIC:  Normal affect   Labs    High Sensitivity Troponin:   Recent Labs  Lab 01/23/20 0943 01/23/20 1235 01/24/20 1011  TROPONINIHS 29* 30* 39*      Chemistry Recent Labs  Lab 01/25/20 0311 01/26/20 0317 01/27/20 0622  NA 137 138 135  K 5.4* 5.2* 5.9*  CL 99 103 100  CO2 23 27 23   GLUCOSE 267* 118* 389*  BUN 44* 52* 65*  CREATININE 3.35* 3.02* 2.35*  CALCIUM 9.0 9.1 8.4*  PROT 5.8* 5.5* 5.5*  ALBUMIN 3.2* 2.9* 2.8*  AST 22 22 21   ALT 15 15 14   ALKPHOS 97 94 118  BILITOT 1.0 0.8 0.4  GFRNONAA 19* 21* 28*  ANIONGAP 15 8 12      Hematology Recent Labs  Lab 01/25/20 1405 01/26/20 0317 01/27/20 0622  WBC 9.1 12.2* 11.4*  RBC 2.67* 3.23* 2.95*  HGB 7.3* 9.1* 8.5*  HCT 23.6* 28.7* 25.9*  MCV 88.4 88.9 87.8  MCH 27.3 28.2 28.8  MCHC 30.9 31.7 32.8  RDW 15.2 14.7 14.6  PLT 213 199 50*    Radiology    DG C-Arm 1-60 Min-No Report  Result Date: 01/26/2020 Fluoroscopy was utilized by the requesting physician.  No radiographic interpretation.    Cardiac Studies   Echo 01/25/2020 1. Left ventricular ejection fraction, by estimation, is 30 to 35%. The  left ventricle has moderately decreased function. The left ventricle  demonstrates global hypokinesis. The left ventricular internal cavity size  was mildly dilated. Left ventricular  diastolic parameters are consistent with Grade I diastolic dysfunction  (impaired relaxation). Elevated left atrial pressure.  2. Right ventricular systolic function is normal. The right ventricular  size is normal.  3. The mitral valve is normal in structure. Mild mitral valve  regurgitation. No evidence of mitral stenosis.  4. The aortic valve is  tricuspid. Aortic valve regurgitation is not  visualized. Mild aortic valve sclerosis is present, with no evidence of  aortic valve stenosis.  5. The inferior vena cava is normal in size with greater than 50%  respiratory variability, suggesting right atrial pressure of 3 mmHg.   Patient Profile     74 y.o. male with known CAD s/p PCI to RCA (2007), LAD and OM2 (2019), newly diagnosed HFrEF with LVEF 30-35% (global hypokinesis), pAfib not on AC, and bladder cancer c/b gross hematuria who initially presented [facility] for hematuria and anemia with course complicated by chest pain prompting transfer to [facility] for further management. Then transferred to [facility] for cystoscopy and palliative radiation given hydronephrosis.  Assessment & Plan    Unstable angina Known CAD with prior interventions -Hs-troponin 29>>30>>39.  -no further chest pain -Not a candidate for cath or antiplatelet therapy due to hematuria and anemia, recent surgery -Continue statin, BB, Imdur and amlodipine.  Chronic systolic and diastolic heart failure -Echo this admission showed LVEF of 30-35%, global hypokinesis and grade 1 DD (LVEF was 25-30% on 12/2019 & 60-65% on 08/2019) -consolidating metoprolol succinate to 100 mg daily dose -No ACE/ARB/Spironolactone/Enteresto due to worsening renal function  -charted as net negative 6L but intake not well charted. Weight yesterday 81.2 kg, weight on admission 77.1 kg. Does not appear grossly volume overloaded on exam today  History of paroxysmal atrial fibrillation s/p prior ablation -not a candidate for anticoagulation given hematuria/anemia -has been in sinus rhythm with PACs on telemetry  Acute kidney injury Anemia, hematuria -Scr 2.03>>2.1>>3.35 >>3.02 >>2.35  today -s/p cystoscopy, resection of bladder tumor, clot evacuation, right ureteral stent removal by Dr. Jeffie Pollock 01/26/20 -Otherwise per primary/urology  [facility] will sign off.   Medication  Recommendations:  Continue amlodipine 2.5 mg daily, atorvastatin 10 mg daily, imdur 30 mg daily, metoprolol succinate 100 mg daily. Hold aspirin, entresto, torsemide for now until renal function normalizes Other recommendations (labs, testing, etc):  Consider increasing atorvastatin as an outpatient. Was on higher dose of metoprolol previously, reassess at follow up Follow up as an outpatient:  Has an appt with Cecilie Kicks on 02/04/20  For questions or updates, please contact [place] Please consult [email or web address] for contact info under      Signed, Buford Dresser, MD  01/27/2020, 12:51 PM

## 2020-01-27 NOTE — Plan of Care (Signed)
  Problem: Education: Goal: Ability to demonstrate management of disease process will improve Outcome: Progressing Goal: Ability to verbalize understanding of medication therapies will improve Outcome: Progressing Goal: Individualized Educational Video(s) Outcome: Progressing   Problem: Activity: Goal: Capacity to carry out activities will improve Outcome: Progressing   Problem: Cardiac: Goal: Ability to achieve and maintain adequate cardiopulmonary perfusion will improve Outcome: Progressing   Problem: Education: Goal: Understanding of cardiac disease, CV risk reduction, and recovery process will improve Outcome: Progressing Goal: Individualized Educational Video(s) Outcome: Progressing   Problem: Activity: Goal: Ability to tolerate increased activity will improve Outcome: Progressing   Problem: Cardiac: Goal: Ability to achieve and maintain adequate cardiovascular perfusion will improve Outcome: Progressing   Problem: Health Behavior/Discharge Planning: Goal: Ability to safely manage health-related needs after discharge will improve Outcome: Progressing   Problem: Education: Goal: Knowledge of General Education information will improve Description: Including pain rating scale, medication(s)/side effects and non-pharmacologic comfort measures Outcome: Progressing   Problem: Health Behavior/Discharge Planning: Goal: Ability to manage health-related needs will improve Outcome: Progressing   Problem: Clinical Measurements: Goal: Ability to maintain clinical measurements within normal limits will improve Outcome: Progressing Goal: Will remain free from infection Outcome: Progressing Goal: Diagnostic test results will improve Outcome: Progressing Goal: Respiratory complications will improve Outcome: Progressing Goal: Cardiovascular complication will be avoided Outcome: Progressing   Problem: Activity: Goal: Risk for activity intolerance will decrease Outcome:  Progressing   Problem: Nutrition: Goal: Adequate nutrition will be maintained Outcome: Progressing   Problem: Coping: Goal: Level of anxiety will decrease Outcome: Progressing   Problem: Elimination: Goal: Will not experience complications related to bowel motility Outcome: Progressing Goal: Will not experience complications related to urinary retention Outcome: Progressing   Problem: Pain Managment: Goal: General experience of comfort will improve Outcome: Progressing   Problem: Safety: Goal: Ability to remain free from injury will improve Outcome: Progressing   Problem: Education: Goal: Knowledge of the prescribed therapeutic regimen will improve Outcome: Progressing   Problem: Bowel/Gastric: Goal: Gastrointestinal status for postoperative course will improve Outcome: Progressing   Problem: Health Behavior/Discharge Planning: Goal: Identification of resources available to assist in meeting health care needs will improve Outcome: Progressing   Problem: Urinary Elimination: Goal: Ability to avoid or minimize complications of infection will improve Outcome: Progressing

## 2020-01-28 ENCOUNTER — Ambulatory Visit
Admit: 2020-01-28 | Discharge: 2020-01-28 | Disposition: A | Discharge status: Home / Self Care | Payer: Medicare Other | Source: Ambulatory Visit | Attending: Radiation Oncology | Admitting: Radiation Oncology

## 2020-01-28 ENCOUNTER — Ambulatory Visit
Admit: 2020-01-28 | Discharge: 2020-01-28 | Disposition: A | Discharge status: Home / Self Care | Payer: Medicare Other | Attending: Radiation Oncology | Admitting: Radiation Oncology

## 2020-01-28 DIAGNOSIS — C671 Malignant neoplasm of dome of bladder: Secondary | ICD-10-CM

## 2020-01-28 DIAGNOSIS — C679 Malignant neoplasm of bladder, unspecified: Secondary | ICD-10-CM | POA: Insufficient documentation

## 2020-01-28 DIAGNOSIS — R310 Gross hematuria: Secondary | ICD-10-CM | POA: Diagnosis not present

## 2020-01-28 DIAGNOSIS — Z510 Encounter for antineoplastic radiation therapy: Secondary | ICD-10-CM | POA: Insufficient documentation

## 2020-01-28 LAB — COMPREHENSIVE METABOLIC PANEL
ALT: 16 U/L (ref 0–44)
AST: 22 U/L (ref 15–41)
Albumin: 2.6 g/dL — ABNORMAL LOW (ref 3.5–5.0)
Alkaline Phosphatase: 113 U/L (ref 38–126)
Anion gap: 8 (ref 5–15)
BUN: 60 mg/dL — ABNORMAL HIGH (ref 8–23)
CO2: 28 mmol/L (ref 22–32)
Calcium: 8.3 mg/dL — ABNORMAL LOW (ref 8.9–10.3)
Chloride: 102 mmol/L (ref 98–111)
Creatinine, Ser: 1.61 mg/dL — ABNORMAL HIGH (ref 0.61–1.24)
GFR, Estimated: 45 mL/min — ABNORMAL LOW (ref >60)
Glucose, Bld: 101 mg/dL — ABNORMAL HIGH (ref 70–99)
Potassium: 4.5 mmol/L (ref 3.5–5.1)
Sodium: 138 mmol/L (ref 135–145)
Total Bilirubin: 0.4 mg/dL (ref 0.3–1.2)
Total Protein: 5.3 g/dL — ABNORMAL LOW (ref 6.5–8.1)

## 2020-01-28 LAB — GLUCOSE, CAPILLARY
Glucose-Capillary: 130 mg/dL — ABNORMAL HIGH (ref 70–99)
Glucose-Capillary: 84 mg/dL (ref 70–99)
Glucose-Capillary: 115 mg/dL — ABNORMAL HIGH (ref 70–99)
Glucose-Capillary: 103 mg/dL — ABNORMAL HIGH (ref 70–99)
Glucose-Capillary: 169 mg/dL — ABNORMAL HIGH (ref 70–99)

## 2020-01-28 LAB — CBC
HCT: 26.3 % — ABNORMAL LOW (ref 39.0–52.0)
Hemoglobin: 8.5 g/dL — ABNORMAL LOW (ref 13.0–17.0)
MCH: 28.6 pg (ref 26.0–34.0)
MCHC: 32.3 g/dL (ref 30.0–36.0)
MCV: 88.6 fL (ref 80.0–100.0)
Platelets: 202 10*3/uL (ref 150–400)
RBC: 2.97 MIL/uL — ABNORMAL LOW (ref 4.22–5.81)
RDW: 14.9 % (ref 11.5–15.5)
WBC: 9.2 10*3/uL (ref 4.0–10.5)
nRBC: 0 % (ref 0.0–0.2)

## 2020-01-28 MED ORDER — INSULIN GLARGINE 100 UNIT/ML ~~LOC~~ SOLN
10.0000 [IU] | Freq: Every day | SUBCUTANEOUS | Status: DC
  Administered 2020-01-28: 10 [IU] via SUBCUTANEOUS
  Filled 2020-01-28 (×2): qty 0.1

## 2020-01-28 MED ORDER — INSULIN ASPART 100 UNIT/ML ~~LOC~~ SOLN
2.0000 [IU] | Freq: Three times a day (TID) | SUBCUTANEOUS | Status: DC
  Administered 2020-01-28 – 2020-01-29 (×4): 2 [IU] via SUBCUTANEOUS

## 2020-01-28 MED ORDER — MELATONIN 3 MG PO TABS
6.0000 mg | ORAL_TABLET | Freq: Every evening | ORAL | Status: DC | PRN
  Administered 2020-01-29: 6 mg via ORAL
  Filled 2020-01-28: qty 2

## 2020-01-28 MED ORDER — GLIPIZIDE 10 MG PO TABS
10.0000 mg | ORAL_TABLET | Freq: Every day | ORAL | Status: DC
  Administered 2020-01-28 – 2020-01-29 (×2): 10 mg via ORAL
  Filled 2020-01-28 (×2): qty 1

## 2020-01-28 MED ORDER — GUAIFENESIN-DM 100-10 MG/5ML PO SYRP
5.0000 mL | ORAL_SOLUTION | ORAL | Status: DC | PRN
  Administered 2020-01-28 (×4): 5 mL via ORAL
  Filled 2020-01-28 (×4): qty 10

## 2020-01-28 NOTE — Progress Notes (Signed)
   01/28/20 0140  Vitals  Temp 97.6 F (36.4 C)  Temp Source Oral  BP 135/75  MAP (mmHg) 93  BP Location Left Arm  BP Method Automatic  Patient Position (if appropriate) Lying  Pulse Rate 60  Pulse Rate Source Monitor  Resp 20  MEWS COLOR  MEWS Score Color Green  Oxygen Therapy  SpO2 99 %  O2 Device Nasal Cannula  O2 Flow Rate (L/min) 2 L/min   Patient requested to see his nurse and shared thatt he was not feeling well, could not rest, and kept coughing. I took his vitals (posted above). Patient stated that he was not in any pain. CBG= 130.  Gave Robitussin at 0202. Re-assessed the patient, he was sleeping.

## 2020-01-28 NOTE — Plan of Care (Signed)
  Problem: Activity: Goal: Capacity to carry out activities will improve Outcome: Progressing   Problem: Activity: Goal: Ability to tolerate increased activity will improve Outcome: Progressing   Problem: Cardiac: Goal: Ability to achieve and maintain adequate cardiovascular perfusion will improve Outcome: Progressing   Problem: Activity: Goal: Risk for activity intolerance will decrease Outcome: Progressing   Problem: Coping: Goal: Level of anxiety will decrease Outcome: Progressing   Problem: Nutrition: Goal: Adequate nutrition will be maintained Outcome: Adequate for Discharge   Problem: Education: Goal: Knowledge of General Education information will improve Description: Including pain rating scale, medication(s)/side effects and non-pharmacologic comfort measures Outcome: Completed/Met

## 2020-01-28 NOTE — Progress Notes (Signed)
PROGRESS NOTE    Richard [name]  ZOX:[id number] DOB: [birth year] DOA: 01/23/2020 PCP: Wannetta Sender, FNP   Chief Complaint  Patient presents with  . Jaw Pain  Brief Narrative:   89yom with T2DM, HTN, CAD with history of stent/CHF, HLD, A-flutter, bladder cancer, chronic hematuria followed by Dr. Alyson Ingles from urology who has had multiple blood clots and hematuria in his Foley catheter and also developed chest pain between the shoulder blades with radiation to the jaw relieved with nitroglycerin and was seen in the ED at any pain, blood work showed hyperglycemia creatinine 2.0 hemoglobin 7.1 troponin positive at 29 cardiology, urology was consulted and was admitted at [facility].Per report for the last several weeks,patient was having progressively worsening and difficult to control hematuria. He was seen by urologist Dr. Alyson Ingles in the office on 11/30, had a cystoscopy done.  But unfortunately there was too much blood present for any treatment.  Foley catheter was placed. He was discharged home with arrangements made for him to have a cystoscopy done under anesthesia on 12/2. The patient has been experiencing multiple blood clots and requires frequent flushing of the Foley catheter  At [place] was being treated for acute blood loss anemia/hematuria/ACS with slightly positive troponin, unable to be treated with heparin due to ongoing hematuria.He has been on continuous bladder irrigation. He also was having issue with hypoxia and was seen by PCCM.  12/4 patient was transferred to [facility] for his ongoing neurological follow-up and procedure. 12/4-Underwent cystoscopy, with transurethral resection of bladder tumor large, clot evacuation bilateral retrograde pyelogram with right for ureteral stent removal-and on the left with Foley catheter off CBI.  Subjective: Came back from XRT mapping. No acute events overnight, creatinine improving leukocytosis resolved. UOP 1000 ml K  normalized  On 2l  no new complaints Voiding well, off foley  Assessment & Plan:  Large Multifocal bladder tumor, with significant hematuria leading to acute blood loss anemia: Had been on CBI- transferred to [facility] from [facility] for urology plan- 12/4 s/p cystoscopy, with transurethral resection of bladder tumor large, clot evacuation bilateral retrograde pyelogram with old rt ureteral stent removal.  Radiation oncology consulted and planning for radiotherapy today.   Unstable angina with known CAD with prior intervention, troponin slightly positive flat 29-30-39: Not candidate for cardiac cath/antiplatelet therapy due to his ongoing hematuria/bladder tumor/anemia.  Continue with statin, beta-blocker Imdur as per cardiology, appreciate input, following closely.  He has no chest pain.  Overall stable.  Patient normally on Toprol 75, Imdur 30, Entresto 49/81, torsemide 20 mg and amlodipine 2.5 mg daily at home.  Chronic systolic and diastolic CHF Echo this admission with EF 30 to 35%, global hypokinesia and grade 1 DD, EF was 25 to 30% on 12/2019 and 60 to 65% on July 2021.  No ACE ARB Aldactone Entresto due to renal failure.  Creatinine improving.  Monitoring volume status closely.  Stable weight at 179, pending today.  History of paroxysmal atrial fibrillation status post prior ablation not a candidate for anticoagulation.  In sinus rhythm.  Monitor in telemetry  AKI on ckd stage IIIa: baseline creat 1.2 in sept 2021-1.72 previously in July 2021. multifactorial with blood loss anemia, hydronephrosis-obstructive nephropathy due to urinary blood clots and bladder tumor.  Creatinine is nicely improving.  BUN remains up in 60s.  Monitor urine output, monitor renal function. Recent Labs  Lab 01/24/20 1011 01/25/20 0311 01/26/20 0317 01/27/20 0622 01/28/20 0504  BUN 42* 44* 52* 65* 60*  CREATININE 2.10* 3.35* 3.02* 2.35* 1.61*   Hyperkalemia: Status post Kayexalate and potassium has normalized.     Acute hypoxic respiratory failure/chronic tobacco abuse: Multifactorial: CHF, atelectasis and from sedation.  Likely has underlying pulmonary disease due to his longstanding smoking history 54 pack years.  Wean off oxygen-not he may need to go home with oxygen.  He is not wheezing anymore today.  He has not used inhaler.will need outpatient follow-up and PFT.    Acute on chronic blood loss anemia with acute and chronic hematuria: Patient IS S/P 2 u PRBC 12/1.  Hemoglobin has been stable.  Monitor. Recent Labs  Lab 01/25/20 0311 01/25/20 1405 01/26/20 0317 01/27/20 0622 01/28/20 0504  HGB 8.5* 7.3* 9.1* 8.5* 8.5*  HCT 25.9* 23.6* 28.7* 25.9* 26.3*   T2DM with uncontrolled hyperglycemia, hemoglobin A1c 9.612/1.  Proglycem has improved after increasing Lantus to 20 units-I will cut down Lantus to 10 units add his home glipizide, continue sliding scale and premeal insulin.Normally is on Metformin/glipizide at home Recent Labs  Lab 01/27/20 1210 01/27/20 1620 01/27/20 2044 01/28/20 0136 01/28/20 0722  GLUCAP 135* 76 159* 130* 84   Essential hypertension: Well-controlled.  Continue amlodipine, imdur and metoprolol.   HLD-continue statin.   Active ongoing smoking discussed about cessation. Stenosis of left carotid artery-cont statins  GEX:BMWU CODE.  With multiple medical comorbidities prognosis remains to be seen, palliative care has been consulted advised after meeting to continue current scope of treatment, full code and outpatient palliative care follow-up.  Nutrition: Diet Order            Diet Carb Modified Fluid consistency: Thin; Room service appropriate? Yes  Diet effective now                Body mass index is 24.28 kg/m.  DVT prophylaxis: SCDs Start: 01/23/20 1455 Code Status:   Code Status: Full Code  Family Communication: plan of care discussed with patient at bedside. I have discussed with pulmonary, nursing staff.  Status is: Inpatient Remains inpatient  appropriate because:IV treatments appropriate due to intensity of illness or inability to take PO and Inpatient level of care appropriate due to severity of illness  Dispo: The patient is from: Home              Anticipated d/c is to: Home with home health .  Continue PT              Anticipated d/c date is 1-2 days once okay with urology/radiation oncology.              Patient currently is not medically stable to d/c.  Consultants: Cardiology, urology, pulmonary.   Procedures:see note  Culture/Microbiology    Component Value Date/Time   SDES  01/15/2020 1533    URINE, CLEAN CATCH Performed at [facility], [address], [place], [place] [address]    [facility]  01/15/2020 1533    NONE Performed at [facility], [address] [place], [place] [address]    CULT  01/15/2020 1533    NO GROWTH Performed at [facility] [address], [place], [place] [address]    REPTSTATUS 01/17/2020 FINAL 01/15/2020 1533    Other culture-see note  Medications: Scheduled Meds: . amLODipine  2.5 mg Oral Daily  . atorvastatin  10 mg Oral Daily  . Chlorhexidine Gluconate Cloth  6 each Topical Daily  . insulin aspart  0-5 Units Subcutaneous QHS  . insulin aspart  0-9 Units Subcutaneous TID WC  .  insulin aspart  4 Units Subcutaneous TID WC  . insulin glargine  20 Units Subcutaneous Daily  . isosorbide mononitrate  30 mg Oral QHS  . metoprolol succinate  100 mg Oral Daily   Continuous Infusions: . sodium chloride 10 mL/hr at 01/25/20 1251    Antimicrobials: Anti-infectives (From admission, onward)   Start     Dose/Rate Route Frequency Ordered Stop   01/26/20 1008  ceFAZolin (ANCEF) 2-4 GM/100ML-% IVPB       Note to Pharmacy: Valinda Party   : cabinet override      01/26/20 1008 01/26/20 1045   01/24/20 0935  ceFAZolin (ANCEF) IVPB 2g/100 mL premix        2 g 200 mL/hr over 30 Minutes Intravenous 30 min pre-op 01/24/20 0935 01/26/20 1109     Objective: Vitals: Today's  Vitals   01/27/20 2052 01/27/20 2103 01/28/20 0140 01/28/20 0510  BP: (!) 143/76  135/75 138/78  Pulse: 62  60 (!) 52  Resp: 18  20 18   Temp: 98.1 F (36.7 C)  97.6 F (36.4 C) 97.8 F (36.6 C)  TempSrc: Oral  Oral Oral  SpO2: 97%  99% 99%  Weight:      Height:      PainSc:  2       Intake/Output Summary (Last 24 hours) at 01/28/2020 0829 Last data filed at 01/28/2020 0617 Gross per 24 hour  Intake 360 ml  Output 1000 ml  Net -640 ml   Filed Weights   01/25/20 0510 01/26/20 0022 01/26/20 0400  Weight: 82.4 kg 81.2 kg 81.2 kg   Weight change:   Intake/Output from previous day: 12/05 0701 - 12/06 0700 In: 360 [P.O.:360] Out: 1000 [Urine:1000] Intake/Output this shift: No intake/output data recorded.  Examination: General exam: AAOx3 , NAD, weak appearing. HEENT:Oral mucosa moist, Ear/Nose WNL grossly, dentition normal. Respiratory system: bilaterally clear,no wheezing or crackles,no use of accessory muscle Cardiovascular system: S1 & S2 +, No JVD,. Gastrointestinal system: Abdomen soft, NT,ND, BS+ Nervous System:Alert, awake, moving extremities and grossly nonfocal Extremities: No edema, distal peripheral pulses palpable.  Skin: No rashes,no icterus. MSK: Normal muscle bulk,tone, power  Data Reviewed: I have personally reviewed following labs and imaging studies CBC: Recent Labs  Lab 01/24/20 1011 01/24/20 1011 01/25/20 0311 01/25/20 1405 01/26/20 0317 01/27/20 0622 01/28/20 0504  WBC 10.2   < > 11.6* 9.1 12.2* 11.4* 9.2  NEUTROABS 8.1*  --   --  7.0  --   --   --   HGB 9.2*   < > 8.5* 7.3* 9.1* 8.5* 8.5*  HCT 28.8*   < > 25.9* 23.6* 28.7* 25.9* 26.3*  MCV 87.3   < > 88.4 88.4 88.9 87.8 88.6  PLT 287   < > 252 213 199 50* 202   < > = values in this interval not displayed.   Basic Metabolic Panel: Recent Labs  Lab 01/24/20 1011 01/25/20 0311 01/26/20 0317 01/27/20 0622 01/28/20 0504  NA 137 137 138 135 138  K 4.9 5.4* 5.2* 5.9* 4.5  CL 97* 99 103  100 102  CO2 28 23 27 23 28   GLUCOSE 260* 267* 118* 389* 101*  BUN 42* 44* 52* 65* 60*  CREATININE 2.10* 3.35* 3.02* 2.35* 1.61*  CALCIUM 9.3 9.0 9.1 8.4* 8.3*  MG  --  1.6* 2.3  --   --    GFR: Estimated Creatinine Clearance: 44.2 mL/min (A) (by C-G formula based on SCr of 1.61 mg/dL (H)). Liver Function Tests:  Recent Labs  Lab 01/25/20 0311 01/26/20 0317 01/27/20 0622 01/28/20 0504  AST 22 22 21 22   ALT 15 15 14 16   ALKPHOS 97 94 118 113  BILITOT 1.0 0.8 0.4 0.4  PROT 5.8* 5.5* 5.5* 5.3*  ALBUMIN 3.2* 2.9* 2.8* 2.6*   No results for input(s): LIPASE, AMYLASE in the last 168 hours. No results for input(s): AMMONIA in the last 168 hours. Coagulation Profile: No results for input(s): INR, PROTIME in the last 168 hours. Cardiac Enzymes: No results for input(s): CKTOTAL, CKMB, CKMBINDEX, TROPONINI in the last 168 hours. BNP (last 3 results) No results for input(s): PROBNP in the last 8760 hours. HbA1C: No results for input(s): HGBA1C in the last 72 hours. CBG: Recent Labs  Lab 01/27/20 1210 01/27/20 1620 01/27/20 2044 01/28/20 0136 01/28/20 0722  GLUCAP 135* 76 159* 130* 84   Lipid Profile: No results for input(s): CHOL, HDL, LDLCALC, TRIG, CHOLHDL, LDLDIRECT in the last 72 hours. Thyroid Function Tests: No results for input(s): TSH, T4TOTAL, FREET4, T3FREE, THYROIDAB in the last 72 hours. Anemia Panel: No results for input(s): VITAMINB12, FOLATE, FERRITIN, TIBC, IRON, RETICCTPCT in the last 72 hours. Sepsis Labs: No results for input(s): PROCALCITON, LATICACIDVEN in the last 168 hours.  Recent Results (from the past 240 hour(s))  Resp Panel by RT-PCR (Flu A&B, Covid) Nasopharyngeal Swab     Status: None   Collection Time: 01/23/20  1:02 PM   Specimen: Nasopharyngeal Swab; Nasopharyngeal(NP) swabs in vial transport medium  Result Value Ref Range Status   SARS Coronavirus 2 by RT PCR NEGATIVE NEGATIVE Final    Comment: (NOTE) SARS-CoV-2 target nucleic acids are  NOT DETECTED.  The SARS-CoV-2 RNA is generally detectable in upper respiratory specimens during the acute phase of infection. The lowest concentration of SARS-CoV-2 viral copies this assay can detect is 138 copies/mL. A negative result does not preclude SARS-Cov-2 infection and should not be used as the sole basis for treatment or other patient management decisions. A negative result may occur with  improper specimen collection/handling, submission of specimen other than nasopharyngeal swab, presence of viral mutation(s) within the areas targeted by this assay, and inadequate number of viral copies(<138 copies/mL). A negative result must be combined with clinical observations, patient history, and epidemiological information. The expected result is Negative.  Fact Sheet for Patients:  EntrepreneurPulse.com.au  Fact Sheet for Healthcare Providers:  IncredibleEmployment.be  This test is no t yet approved or cleared by the [place] FDA and  has been authorized for detection and/or diagnosis of SARS-CoV-2 by FDA under an Emergency Use Authorization (EUA). This EUA will remain  in effect (meaning this test can be used) for the duration of the COVID-19 declaration under Section 564(b)(1) of the Act, 21 U.S.C.section 360bbb-3(b)(1), unless the authorization is terminated  or revoked sooner.       Influenza A by PCR NEGATIVE NEGATIVE Final   Influenza B by PCR NEGATIVE NEGATIVE Final    Comment: (NOTE) The Xpert Xpress SARS-CoV-2/FLU/RSV plus assay is intended as an aid in the diagnosis of influenza from Nasopharyngeal swab specimens and should not be used as a sole basis for treatment. Nasal washings and aspirates are unacceptable for Xpert Xpress SARS-CoV-2/FLU/RSV testing.  Fact Sheet for Patients: EntrepreneurPulse.com.au  Fact Sheet for Healthcare Providers: IncredibleEmployment.be  This test is not  yet approved or cleared by the [place] FDA and has been authorized for detection and/or diagnosis of SARS-CoV-2 by FDA under an Emergency Use Authorization (EUA). This EUA will remain  in effect (meaning this test can be used) for the duration of the COVID-19 declaration under Section 564(b)(1) of the Act, 21 U.S.C. section 360bbb-3(b)(1), unless the authorization is terminated or revoked.  Performed at [facility], [address], [place], [place] [address]      Radiology Studies: DG C-Arm 1-60 Min-No Report  Result Date: 01/26/2020 Fluoroscopy was utilized by the requesting physician.  No radiographic interpretation.     LOS: 5 days   Antonieta Pert, MD [facility]  01/28/2020, 8:29 AM

## 2020-01-28 NOTE — Progress Notes (Signed)
Family has the form.  Daughter stated that her mother needs to fill out the form and will do it Tuesday.  I shared to call the chaplain when they are ready to have it finished. Conard Novak, Chaplain   01/28/20 1500  Clinical Encounter Type  Visited With Patient and family together  Visit Type Other (Comment) (Requested Adv Directive)  Referral From Nurse  Consult/Referral To Chaplain  Spiritual Encounters  Spiritual Needs Literature (AD form)

## 2020-01-28 NOTE — Consult Note (Signed)
Radiation Oncology         [phone] ________________________________  Initial outpatient Consultation  Name: Richard [name] MRN: [id number]  Date of Service: 01/28/20 DOB: [birth year]  TS:[id number], Laney Potash IV, FNP    REFERRING PHYSICIAN: Dr. Milford Cage  DIAGNOSIS: The primary encounter diagnosis was Symptomatic anemia. Diagnoses of Chest pain, Angina at rest [facility]), Bladder hemorrhage, AKI (acute kidney injury) ([place]), Gross hematuria, and Respiration abnormal were also pertinent to this visit.    ICD-10-CM   1. Symptomatic anemia  D64.9   2. Chest pain  R07.9 DG Chest [facility] 1 View    DG Chest [facility] 1 View  3. Angina at rest [facility])  I20.8   4. Bladder hemorrhage  N32.89   5. AKI (acute kidney injury) (HCC)  N17.9   6. Gross hematuria  R31.0 ceFAZolin (ANCEF) IVPB 2g/100 mL premix  7. Respiration abnormal  R06.9 DG Chest [facility] 1 View    DG Chest Port 1 View    HISTORY OF PRESENT ILLNESS: Richard [name] is a 74 y.o. male seen at the request of Dr. 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 He was not felt to be a good candidate for cystectomy given his other medical comorbidities and was coming in for fulguration of the cancer but developed chest pain and was sent to the ED for evaluation.  Cardiology has weighed in and is felt to not tolerate dual antiplatelet therapy, he does have known coronary artery disease and interval worsening of left ventricular ejection fraction, he does not need any additional cardiac testing but the plan is to continue to try and optimize him medically, and it was felt that his hemoglobin was the result of acute blood loss anemia from his bladder cancer. At the time of his presentation his hemoglobin was 7.1, with blood products, his hemoglobin is  now 8.5.  He is contacted this morning to discuss urgent intervention with radiotherapy.  He did undergo staging scans over the weekend which showed a right ureteral stent in good position with bilateral hydronephrosis, a large amount of blood clot noted within the bladder lumen with mild wall thickening involving the left side of the urinary bladder, mild anasarca and small Filippou fluid-filled left inguinal hernia.  Chest x-rays showed minimal right basilar atelectasis and evidence of atherosclerotic disease.  He did also undergo surgical evacuation of the bladder clot 01/26/2020 with complete removal, and the patient is no longer requiring Foley catheter.  While a palliative course of radiotherapy has been considered, he is not a candidate for other types of systemic treatment either given his heart, and Dr. Tammi Klippel has proposed definitive dose radiotherapy to begin today.  I was able to reach the patient and his wife both by phone so they could both be a part of our conversation.   PREVIOUS RADIATION THERAPY: No  PAST MEDICAL HISTORY:  Past Medical History:  Diagnosis Date  . Anemia 12/2019  . Bladder cancer ([place])   . CAD (coronary artery disease)    a. 2007 - DES x 2 proximal to mid RCA b. 09/2017: DES to mid LAD and OM2. Patent stents along RCA.   [name] CHF (congestive heart failure) ([place])   . Essential hypertension   . History of atrial flutter    Status post ablation 2008 -  Dr. Caryl Comes  . History of transient ischemic attack (TIA)   . Mixed hyperlipidemia    Statin intolerance  . Pneumonia   . Type 2 diabetes mellitus ([place])    A1C 11.5 06/2017      PAST SURGICAL HISTORY: Past Surgical History:  Procedure Laterality Date  . BALLOON ANGIOPLASTY, ARTERY    . BICEPS TENDON REPAIR Left   . CARDIAC ELECTROPHYSIOLOGY MAPPING AND ABLATION    . CARPAL TUNNEL RELEASE Bilateral   . cervical neck fusion     x 4  . CORONARY ANGIOPLASTY    . CORONARY STENT INTERVENTION N/A 10/17/2017    Procedure: CORONARY STENT INTERVENTION;  Surgeon: Martinique, Peter M, MD;  Location: [place] [facility];  Service: Cardiovascular;  Laterality: N/A;  . CORONARY STENT PLACEMENT     x 2  . CYSTOSCOPY W/ RETROGRADES Bilateral 10/08/2019   Procedure: CYSTOSCOPY WITH BILATERAL RETROGRADE PYELOGRAM;  Surgeon: Cleon Gustin, MD;  Location: [facility];  Service: Urology;  Laterality: Bilateral;  . CYSTOSCOPY W/ URETERAL STENT PLACEMENT Bilateral 01/26/2020   Procedure: CYSTOSCOPY WITH RETROGRADE PYELOGRAM bilateral right ureter stent removal;  Surgeon: Irine Seal, MD;  Location: [facility];  Service: Urology;  Laterality: Bilateral;  . CYSTOSCOPY WITH FULGERATION N/A 01/26/2020   Procedure: CYSTOSCOPY WITH FULGERATION, CLOT EVACUATION transurethral resection bladder tumor;  Surgeon: Irine Seal, MD;  Location: [facility];  Service: Urology;  Laterality: N/A;  . CYSTOSCOPY WITH URETHRAL DILATATION  10/08/2019   Procedure: CYSTOSCOPY WITH URETHRAL DILATATION;  Surgeon: Cleon Gustin, MD;  Location: [facility];  Service: Urology;;  . LEFT HEART CATH AND CORONARY ANGIOGRAPHY N/A 10/17/2017   Procedure: LEFT HEART CATH AND CORONARY ANGIOGRAPHY;  Surgeon: Martinique, Peter M, MD;  Location: [place] [facility];  Service: Cardiovascular;  Laterality: N/A;  . ROTATOR CUFF REPAIR Right    x 4  . TIBIA FRACTURE SURGERY Left   . TRANSURETHRAL RESECTION OF BLADDER TUMOR N/A 10/08/2019   Procedure: TRANSURETHRAL RESECTION OF BLADDER TUMOR (TURBT);  Surgeon: Cleon Gustin, MD;  Location: [facility];  Service: Urology;  Laterality: N/A;    FAMILY HISTORY:  Family History  Problem Relation Age of Onset  . Diabetes Mother   . Heart disease Mother   . Heart disease Brother   . Lung cancer Brother   . Emphysema Father   . Lung cancer Brother        x 2  . Lupus Daughter   . Colon cancer Neg Hx   . Esophageal cancer Neg Hx   . Rectal cancer Neg Hx   . Stomach cancer Neg Hx   . Prostate cancer Neg Hx     SOCIAL HISTORY:   Social History   Socioeconomic History  . Marital status: Married    Spouse name: Not on file  . Number of children: 4  . Years of education: Not on file  . Highest education level: Not on file  Occupational History  . Occupation: truck Geophysicist/field seismologist    Comment: retired  Tobacco Use  . Smoking status: Current Every Day Smoker    Packs/day: 1.00    Years: 54.00    Pack years: 54.00    Types: Cigarettes  . Smokeless tobacco: Never Used  Vaping Use  . Vaping Use: Never used  Substance and Sexual Activity  . Alcohol use: No    Alcohol/week: 0.0 standard drinks    Comment: rare  . Drug use: No  . Sexual activity: Not Currently  Other Topics  Concern  . Not on file  Social History Narrative  . Not on file   Social Determinants of Health   Financial Resource Strain: Low Risk   . Difficulty of Paying Living Expenses: Not hard at all  Food Insecurity: No Food Insecurity  . Worried About Charity fundraiser in the Last Year: Never true  . Ran Out of Food in the Last Year: Never true  Transportation Needs: No Transportation Needs  . Lack of Transportation (Medical): No  . Lack of Transportation (Non-Medical): No  Physical Activity: Inactive  . Days of Exercise per Week: 0 days  . Minutes of Exercise per Session: 0 min  Stress: No Stress Concern Present  . Feeling of Stress : Not at all  Social Connections: Moderately Isolated  . Frequency of Communication with Friends and Family: Three times a week  . Frequency of Social Gatherings with Friends and Family: Three times a week  . Attends Religious Services: Never  . Active Member of Clubs or Organizations: No  . Attends Archivist Meetings: Never  . Marital Status: Married  Human resources officer Violence: Not At Risk  . Fear of Current or Ex-Partner: No  . Emotionally Abused: No  . Physically Abused: No  . Sexually Abused: No  The patient is married and lives in [place].  His wife is a retired  Marine scientist.  ALLERGIES: Statins  MEDICATIONS:  Current Facility-Administered Medications  Medication Dose Route Frequency Provider Last Rate Last Admin  . 0.9 %  sodium chloride infusion   Intravenous Continuous Minor, Grace Bushy, NP 10 mL/hr at 01/25/20 1251 Rate Change at 01/25/20 1251  . acetaminophen (TYLENOL) tablet 650 mg  650 mg Oral Q6H PRN Wynetta Emery, Clanford L, MD   650 mg at 01/23/20 2223   Or  . acetaminophen (TYLENOL) suppository 650 mg  650 mg Rectal Q6H PRN Johnson, Clanford L, MD      . albuterol (VENTOLIN HFA) 108 (90 Base) MCG/ACT inhaler 2 puff  2 puff Inhalation Q4H PRN Kc, Ramesh, MD      . amLODipine (NORVASC) tablet 2.5 mg  2.5 mg Oral Daily Johnson, Clanford L, MD   2.5 mg at 01/27/20 0845  . atorvastatin (LIPITOR) tablet 10 mg  10 mg Oral Daily Johnson, Clanford L, MD   10 mg at 01/27/20 0846  . bisacodyl (DULCOLAX) EC tablet 5 mg  5 mg Oral Daily PRN Johnson, Clanford L, MD      . Chlorhexidine Gluconate Cloth 2 % PADS 6 each  6 each Topical Daily Murlean Iba, MD   6 each at 01/27/20 0846  . guaiFENesin-dextromethorphan (ROBITUSSIN DM) 100-10 MG/5ML syrup 5 mL  5 mL Oral Q4H PRN Antonieta Pert, MD   5 mL at 01/28/20 0615  . HYDROcodone-acetaminophen (NORCO/VICODIN) 5-325 MG per tablet 1 tablet  1 tablet Oral Q6H PRN Zierle-Ghosh, Asia B, DO   1 tablet at 01/27/20 1507  . HYDROmorphone (DILAUDID) injection 1 mg  1 mg Intravenous Q2H PRN Dahal, Marlowe Aschoff, MD   1 mg at 01/26/20 1510  . insulin aspart (novoLOG) injection 0-5 Units  0-5 Units Subcutaneous QHS Murlean Iba, MD   3 Units at 01/26/20 2107  . insulin aspart (novoLOG) injection 0-9 Units  0-9 Units Subcutaneous TID WC Johnson, Clanford L, MD   1 Units at 01/27/20 1235  . insulin aspart (novoLOG) injection 4 Units  4 Units Subcutaneous TID WC Antonieta Pert, MD   4 Units at 01/27/20 1239  .  insulin glargine (LANTUS) injection 20 Units  20 Units Subcutaneous Daily Antonieta Pert, MD   20 Units at 01/27/20 1116  .  isosorbide mononitrate (IMDUR) 24 hr tablet 30 mg  30 mg Oral QHS Johnson, Clanford L, MD   30 mg at 01/27/20 2110  . metoprolol succinate (TOPROL-XL) 24 hr tablet 100 mg  100 mg Oral Daily Buford Dresser, MD      . nitroGLYCERIN (NITROSTAT) SL tablet 0.4 mg  0.4 mg Sublingual Q5 min PRN Johnson, Clanford L, MD      . ondansetron (ZOFRAN) injection 4 mg  4 mg Intravenous Q6H PRN Dahal, Marlowe Aschoff, MD   4 mg at 01/25/20 0949    REVIEW OF SYSTEMS:  On review of systems, the patient reports that he is doing pretty well overall, he is not having to rely on a catheter, he still is seeing some small amount of blood in his urine.  He is having some discomfort in the low pelvis but rates this is a 2 out of 10.  No other complaints of bowel disturbances identified, no additional chest pain is noted.   PHYSICAL EXAM:  Wt Readings from Last 3 Encounters:  01/26/20 179 lb 0.2 oz (81.2 kg)  01/22/20 170 lb (77.1 kg)  01/15/20 170 lb (77.1 kg)   Temp Readings from Last 3 Encounters:  01/28/20 97.8 F (36.6 C) (Oral)  01/22/20 97.9 F (36.6 C)  01/15/20 97.7 F (36.5 C) (Oral)   BP Readings from Last 3 Encounters:  01/28/20 138/78  01/22/20 (!) 137/58  01/15/20 (!) 155/82   Pulse Readings from Last 3 Encounters:  01/28/20 (!) 52  01/22/20 84  01/15/20 70   Pain Assessment Pain Score: 2 /10  Unable to assess due to encounter type.  KPS =50  100 - Normal; no complaints; no evidence of disease. 90   - Able to carry on normal activity; minor signs or symptoms of disease. 80   - Normal activity with effort; some signs or symptoms of disease. 98   - Cares for self; unable to carry on normal activity or to do active work. 60   - Requires occasional assistance, but is able to care for most of his personal needs. 50   - Requires considerable assistance and frequent medical care. 40   - Disabled; requires special care and assistance. 93   - Severely disabled; hospital admission is indicated  although death not imminent. 26   - Very sick; hospital admission necessary; active supportive treatment necessary. 10   - Moribund; fatal processes progressing rapidly. 0     - Dead  Karnofsky DA, Abelmann [place], Craver LS and Burchenal JH [phone]) The use of the nitrogen mustards in the palliative treatment of carcinoma: with particular reference to bronchogenic carcinoma Cancer 1 634-56  LABORATORY DATA:  Lab Results  Component Value Date   WBC 9.2 01/28/2020   HGB 8.5 (L) 01/28/2020   HCT 26.3 (L) 01/28/2020   MCV 88.6 01/28/2020   PLT 202 01/28/2020   Lab Results  Component Value Date   NA 138 01/28/2020   K 4.5 01/28/2020   CL 102 01/28/2020   CO2 28 01/28/2020   Lab Results  Component Value Date   ALT 16 01/28/2020   AST 22 01/28/2020   ALKPHOS 113 01/28/2020   BILITOT 0.4 01/28/2020     RADIOGRAPHY: CT ABDOMEN PELVIS WO CONTRAST  Result Date: 01/25/2020 CLINICAL DATA:  Bladder cancer.  Flank pain. EXAM: CT ABDOMEN AND PELVIS WITHOUT  CONTRAST TECHNIQUE: Multidetector CT imaging of the abdomen and pelvis was performed following the standard protocol without IV contrast. COMPARISON:  September 16, 2019. FINDINGS: Lower chest: No acute abnormality. Hepatobiliary: No focal liver abnormality is seen. No gallstones, gallbladder wall thickening, or biliary dilatation. Pancreas: Unremarkable. No pancreatic ductal dilatation or surrounding inflammatory changes. Spleen: Normal in size without focal abnormality. Adrenals/Urinary Tract: Adrenal glands appear normal. Mild bilateral perinephric stranding is noted. Right ureteral stent is in grossly good position. Mild bilateral hydronephrosis is noted. Foley catheter is noted within distended urinary bladder. Large amount of blood clot is noted within the bladder lumen. Mild wall thickening is seen involving the left side of the urinary bladder likely reflecting postoperative change. Stomach/Bowel: Stomach is within normal limits. Appendix appears  normal. No evidence of bowel wall thickening, distention, or inflammatory changes. Vascular/Lymphatic: Aortic atherosclerosis. No enlarged abdominal or pelvic lymph nodes. Reproductive: Prostate is unremarkable. Other: Mild anasarca is noted. Small fluid-filled left inguinal hernia is noted. Musculoskeletal: No acute or significant osseous findings. IMPRESSION: 1. Right ureteral stent is in grossly good position. Mild bilateral hydronephrosis is noted. 2. Large amount of blood clot is noted within the bladder lumen. Mild wall thickening is seen involving the left side of the urinary bladder likely reflecting postoperative change. 3. Mild anasarca is noted. 4. Small fluid-filled left inguinal hernia. Aortic Atherosclerosis (ICD10-I70.0). Electronically Signed   By: Marijo Conception M.D.   On: 01/25/2020 09:34   DG Chest Port 1 View  Result Date: 01/25/2020 CLINICAL DATA:  Abnormal respiration, LEFT flank pain EXAM: PORTABLE CHEST 1 VIEW COMPARISON:  Portable exam 1233 hours compared to 01/23/2020 FINDINGS: Upper normal heart size with pulmonary vascular congestion. Mediastinal contours normal. Atherosclerotic calcification aorta. Minimal RIGHT basilar atelectasis. Lungs is otherwise clear. No pulmonary infiltrate, pleural effusion, or pneumothorax. Prior cervical spine fusion. IMPRESSION: Minimal RIGHT basilar atelectasis. Aortic Atherosclerosis (ICD10-I70.0). Electronically Signed   By: Lavonia Dana M.D.   On: 01/25/2020 13:00   DG Chest Port 1 View  Result Date: 01/23/2020 CLINICAL DATA:  Chest pain EXAM: PORTABLE CHEST 1 VIEW COMPARISON:  11/21/2019 FINDINGS: Artifact from EKG leads. There is no edema, consolidation, effusion, or pneumothorax. Normal heart size and mediastinal contours. IMPRESSION: No evidence of active disease. Electronically Signed   By: Monte Fantasia M.D.   On: 01/23/2020 11:47   DG C-Arm 1-60 Min-No Report  Result Date: 01/26/2020 Fluoroscopy was utilized by the requesting  physician.  No radiographic interpretation.   ECHOCARDIOGRAM COMPLETE  Result Date: 01/24/2020    ECHOCARDIOGRAM REPORT   Patient Name:   SINJIN AMERO Date of Exam: 01/24/2020 Medical Rec #:  [id number]      Height:       72.0 in Accession #:    [id number]     Weight:       169.2 lb Date of Birth:  07-10-1945      BSA:          1.984 m Patient Age:    1 years       BP:           152/90 mmHg Patient Gender: M              HR:           86 bpm. Exam Location:  Inpatient Procedure: 2D Echo, Cardiac Doppler and Color Doppler Indications:    R94.31 Abnormal EKG  History:        Patient has prior history of Echocardiogram examinations,  most                 recent 12/26/2019. TIA, Arrythmias:Atrial Flutter,                 Signs/Symptoms:Dyspnea; Risk Factors:Hypertension, Diabetes,                 Dyslipidemia and Former Smoker. Cancer.  Sonographer:    Tiffany Dance Referring Phys: [id number] [place]  1. Left ventricular ejection fraction, by estimation, is 30 to 35%. The left ventricle has moderately decreased function. The left ventricle demonstrates global hypokinesis. The left ventricular internal cavity size was mildly dilated. Left ventricular diastolic parameters are consistent with Grade I diastolic dysfunction (impaired relaxation). Elevated left atrial pressure.  2. Right ventricular systolic function is normal. The right ventricular size is normal.  3. The mitral valve is normal in structure. Mild mitral valve regurgitation. No evidence of mitral stenosis.  4. The aortic valve is tricuspid. Aortic valve regurgitation is not visualized. Mild aortic valve sclerosis is present, with no evidence of aortic valve stenosis.  5. The inferior vena cava is normal in size with greater than 50% respiratory variability, suggesting right atrial pressure of 3 mmHg. FINDINGS  Left Ventricle: Left ventricular ejection fraction, by estimation, is 30 to 35%. The left ventricle has moderately decreased function.  The left ventricle demonstrates global hypokinesis. The left ventricular internal cavity size was mildly dilated. There is no left ventricular hypertrophy. Left ventricular diastolic parameters are consistent with Grade I diastolic dysfunction (impaired relaxation). Elevated left atrial pressure. Right Ventricle: The right ventricular size is normal. Right ventricular systolic function is normal. Left Atrium: Left atrial size was normal in size. Right Atrium: Right atrial size was normal in size. Pericardium: There is no evidence of pericardial effusion. Mitral Valve: The mitral valve is normal in structure. Mild mitral valve regurgitation. No evidence of mitral valve stenosis. Tricuspid Valve: The tricuspid valve is normal in structure. Tricuspid valve regurgitation is mild . No evidence of tricuspid stenosis. Aortic Valve: The aortic valve is tricuspid. Aortic valve regurgitation is not visualized. Mild aortic valve sclerosis is present, with no evidence of aortic valve stenosis. Pulmonic Valve: The pulmonic valve was not well visualized. Pulmonic valve regurgitation is not visualized. No evidence of pulmonic stenosis. Aorta: The aortic root is normal in size and structure. Venous: The inferior vena cava is normal in size with greater than 50% respiratory variability, suggesting right atrial pressure of 3 mmHg.  LEFT VENTRICLE PLAX 2D LVIDd:         5.20 cm  Diastology LVIDs:         4.60 cm  LV e' medial:    5.25 cm/s LV PW:         1.20 cm  LV E/e' medial:  18.9 LV IVS:        0.80 cm  LV e' lateral:   5.18 cm/s LVOT diam:     2.20 cm  LV E/e' lateral: 19.1 LV SV:         66 LV SV Index:   33 LVOT Area:     3.80 cm  RIGHT VENTRICLE             IVC RV Basal diam:  3.40 cm     IVC diam: 1.60 cm RV Mid diam:    1.90 cm RV S prime:     13.00 cm/s TAPSE (M-mode): 1.9 cm LEFT ATRIUM  Index       RIGHT ATRIUM           Index LA diam:        3.60 cm 1.81 cm/m  RA Area:     18.70 cm LA Vol (A2C):   86.6 ml  43.64 ml/m RA Volume:   54.70 ml  27.57 ml/m LA Vol (A4C):   44.0 ml 22.17 ml/m LA Biplane Vol: 60.2 ml 30.34 ml/m  AORTIC VALVE LVOT Vmax:   74.00 cm/s LVOT Vmean:  54.100 cm/s LVOT VTI:    0.173 m  AORTA Ao Root diam: 3.30 cm Ao Asc diam:  3.00 cm MITRAL VALVE MV Area (PHT): 3.42 cm    SHUNTS MV Decel Time: 222 msec    Systemic VTI:  0.17 m MV E velocity: 99.10 cm/s  Systemic Diam: 2.20 cm MV A velocity: 87.70 cm/s MV E/A ratio:  1.13 Kirk Ruths MD Electronically signed by Kirk Ruths MD Signature Date/Time: 01/24/2020/2:41:59 PM    Final       IMPRESSION/PLAN: 1. 74 y.o. gentleman with locally advanced high-grade urothelial carcinoma of the bladder with invasion of the muscularis.  Dr. Tammi Klippel has been in contact with Dr. Milford Cage and the team in urology.  Unfortunately the patient is not a candidate for cystectomy, and while his CT imaging does not show gross evidence of metastatic disease, given that he is also not a candidate for systemic chemotherapy with his heart condition, he would be a good candidate for radiotherapy.  Dr. Tammi Klippel would like to offer initiation of definitive dose radiotherapy with curative intent over approximately 36 fractions.  We discussed that the course could be altered to a palliative therapy if needed however the patient is interested and understands the delivery and logistics specifically since he does not live locally in [place].  We reviewed the risks, benefits, short and long-term effects of radiotherapy to the pelvis, the patient is interested in moving forward.  He will come down to simulate this morning at 9 AM, we anticipate beginning his treatment this afternoon, and would expect improvement in bleeding within the next 2 weeks. He is in agreement and understands the need to complete additional radiotherapy following his hospital discharge whenever this is been determined by the hospitalist team and urologic team. 2. History of coronary artery disease, and  cardiomyopathy.  The patient will follow up with cardiology as an outpatient, they plan to continue to medically optimize him moving forward.  In a visit lasting 45 minutes, greater than 50% of the time was spent by phone and in floor time discussing the patient's condition, in preparation for the discussion, and coordinating the patient's care.     Carola Rhine, [facility]   Page Me    On behalf of _____________________________________  Sheral Apley Tammi Klippel, M.D.

## 2020-01-28 NOTE — Progress Notes (Shared)
  Radiation Oncology         [phone]) [phone] ________________________________  Name: Richard [name] MRN: [id number]  Date: 01/28/2020  DOB: 16-Apr-1945  INPATIENT  SIMULATION AND TREATMENT PLANNING NOTE    ICD-10-CM   1. 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 man with at least T2, muscle invasive high-grade papillary urothelial cancer with squamous cell differentiation, and hematuria  NARRATIVE:  The patient was brought to the [facility].  Identity was confirmed.  All relevant records and images related to the planned course of therapy were reviewed.  The patient freely provided informed written consent to proceed with treatment after reviewing the details related to the planned course of therapy. The consent form was witnessed and verified by the simulation staff.  Then, the patient was set-up in a stable reproducible  supine position for radiation therapy.  Contrast was instilled into the bladder with a catheter under sterile conditions.  CT images were obtained.  Surface markings were placed.  The CT images were loaded into the planning software.  Then the target and avoidance structures were contoured.  Treatment planning then occurred.  The radiation prescription was entered and confirmed.  Then, I designed and supervised the construction of a total of 5 medically necessary complex treatment devices including VacLoc body positioner and 4 MLCs to shield the bowel and femoral necks.  I have requested : 3D Simulation  I have requested a DVH of the following structures: small bowel, rectum, left femoral head, right femoral head and targets.  PLAN:  The patient will receive 19.8 Gy in 11 fractions of 1.8 Gy to the bladder tumor with full bladder, then 45 Gy in 25 fractions to the whole bladder and pelvic nodes to a total dose of 64.8 Gy.  ________________________________  Sheral Apley Tammi Klippel, M.D.

## 2020-01-28 NOTE — TOC Initial Note (Signed)
Transition of Care [facility]) - Initial/Assessment Note    Patient Details  Name: Richard [name] MRN: [id number] Date of Birth: 01/27/1946  Transition of Care [facility]) CM/SW Contact:    Dessa Phi, RN Phone Number: 01/28/2020, 12:42 PM  Clinical Narrative:   D/c plan home w/HHPT only-spoude declines any other HHC service-she provides assst w/adl's. Has no dme, & spouse doesn't feel he will need any. On 02-will monitor if needed @ home.                Expected Discharge Plan: [place] Barriers to Discharge: Continued Medical Work up   Patient Goals and CMS Choice Patient states their goals for this hospitalization and ongoing recovery are:: go home CMS Medicare.gov Compare Post Acute Care list provided to:: Patient Represenative (must comment) (spouse Katharine Look) Choice offered to / list presented to : Spouse  Expected Discharge Plan and Services Expected Discharge Plan: [place]   Discharge Planning Services: CM Consult Post Acute Care Choice: [place] arrangements for the past 2 months: [place]: PT [place]: [place] Date [place]: 01/28/20 Time [place] Contacted: [phone] Representative spoke with at [place]: Tommi Rumps  Prior Living Arrangements/Services Living arrangements for the past 2 months: [place] Lives with:: Spouse Patient language and need for interpreter reviewed:: Yes Do you feel safe going back to the place where you live?: Yes      Need for Family Participation in Patient Care: No (Comment) Care giver support system in place?: Yes (comment)   Criminal Activity/Legal Involvement Pertinent to Current Situation/Hospitalization: No - Comment as needed  Activities of Daily Living Home Assistive Devices/Equipment: Eyeglasses ADL Screening (condition at time of admission) Patient's cognitive ability adequate to safely complete daily  activities?: Yes Is the patient deaf or have difficulty hearing?: No Does the patient have difficulty seeing, even when wearing glasses/contacts?: No Does the patient have difficulty concentrating, remembering, or making decisions?: No Patient able to express need for assistance with ADLs?: Yes Does the patient have difficulty dressing or bathing?: No Independently performs ADLs?: Yes (appropriate for developmental age) Does the patient have difficulty walking or climbing stairs?: Yes Weakness of Legs: Both Weakness of Arms/Hands: None  Permission Sought/Granted Permission sought to share information with : Case Manager Permission granted to share information with : Yes, Verbal Permission Granted  Share Information with NAME: Case Manager           Emotional Assessment Appearance:: Appears stated age Attitude/Demeanor/Rapport: Gracious Affect (typically observed): Accepting Orientation: : Oriented to Self, Oriented to Place, Oriented to  Time, Oriented to Situation Alcohol / Substance Use: Not Applicable Psych Involvement: No (comment)  Admission diagnosis:  Bladder hemorrhage [N32.89] Angina at rest [facility]) [I20.8] AKI (acute kidney injury) ([place]) [N17.9] Chest pain [R07.9] Symptomatic anemia [D64.9] Patient Active Problem List   Diagnosis Date Noted  . Gross hematuria 01/23/2020  . Acute blood loss anemia 01/23/2020  . 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 Bladder mass 09/20/2019  . Hematuria 09/16/2019  . AKI (acute kidney injury) ([place]) 09/16/2019  . Pulmonary nodules 09/16/2019  . Cigarette nicotine dependence without complication 00/93/8182  . Hyperlipidemia associated with type 2 diabetes mellitus (HCC) - Statin intolerant 10/18/2017  . Unstable angina ([place])   .  Chest pain 10/16/2017  . Statin intolerance 07/22/2017  . Stenosis of left carotid artery 07/22/2017  . Atrial flutter ([place]) - s/p ablation   . TIA (transient ischemic attack)  06/27/2017  . Medicare annual wellness visit, subsequent 01/04/2017  . Need for hepatitis C screening test 01/04/2017  . Chronic non-seasonal allergic rhinitis 04/08/2016  . Need for vaccination 04/08/2016  . DDD (degenerative disc disease), cervical 06/17/2015  . Essential hypertension 11/14/2012  . RLL pneumonia 11/14/2012  . Type 2 diabetes mellitus with complication, with long-term current use of insulin ([place]) 11/14/2012  . Coronary artery disease involving native coronary artery of native heart with unstable angina pectoris ([place]) 11/14/2012  . HIATAL HERNIA 11/24/1983   PCP:  Wannetta Sender, FNP Pharmacy:   [id number] [place], [place] [address] Phone: [phone] Fax: [place], [address] [place] [address] Phone: [phone] Fax: [phone] - [place], [place] [address] Phone: [phone] Fax: [phone]     Social Determinants of Health (SDOH) Interventions    Readmission Risk Interventions No flowsheet data found.

## 2020-01-28 NOTE — Plan of Care (Signed)
  Problem: Education: Goal: Ability to demonstrate management of disease process will improve Outcome: Progressing Goal: Ability to verbalize understanding of medication therapies will improve Outcome: Progressing   Problem: Cardiac: Goal: Ability to achieve and maintain adequate cardiopulmonary perfusion will improve Outcome: Progressing   Problem: Education: Goal: Understanding of cardiac disease, CV risk reduction, and recovery process will improve Outcome: Progressing   Problem: Cardiac: Goal: Ability to achieve and maintain adequate cardiovascular perfusion will improve Outcome: Progressing   Problem: Health Behavior/Discharge Planning: Goal: Ability to safely manage health-related needs after discharge will improve Outcome: Progressing   Problem: Health Behavior/Discharge Planning: Goal: Ability to manage health-related needs will improve Outcome: Progressing   Problem: Clinical Measurements: Goal: Ability to maintain clinical measurements within normal limits will improve Outcome: Progressing Goal: Diagnostic test results will improve Outcome: Progressing Goal: Respiratory complications will improve Outcome: Progressing Goal: Cardiovascular complication will be avoided Outcome: Progressing   Problem: Activity: Goal: Risk for activity intolerance will decrease Outcome: Progressing   Problem: Coping: Goal: Level of anxiety will decrease Outcome: Progressing   Problem: Elimination: Goal: Will not experience complications related to bowel motility Outcome: Progressing Goal: Will not experience complications related to urinary retention Outcome: Progressing   Problem: Education: Goal: Knowledge of the prescribed therapeutic regimen will improve Outcome: Progressing   Problem: Bowel/Gastric: Goal: Gastrointestinal status for postoperative course will improve Outcome: Progressing   Problem: Health Behavior/Discharge Planning: Goal: Identification of  resources available to assist in meeting health care needs will improve Outcome: Progressing   Problem: Urinary Elimination: Goal: Ability to avoid or minimize complications of infection will improve Outcome: Progressing

## 2020-01-28 NOTE — Progress Notes (Signed)
OT Cancellation Note  Patient Details Name: CARLOSDANIEL GROB MRN: [id number] DOB: [birth year]   Cancelled Treatment:    Reason Eval/Treat Not Completed: Patient at procedure or test/ unavailable. Patient off floor for radiation. Will continue efforts toward completion of OT evaluation when patient is available.   Gloris [place] OTR/L Supplemental OT, Department of rehab services [phone]  Giannis Corpuz R H. 01/28/2020, 8:54 AM

## 2020-01-28 NOTE — Care Management Important Message (Signed)
Important Message  Patient Details IM Letter given to the Patient. Name: Richard [name] MRN: [id number] Date of Birth: [birth year]   Medicare Important Message Given:  Yes     Kerin Salen 01/28/2020, 4:00 PM

## 2020-01-28 NOTE — Progress Notes (Signed)
Palliative care brief note  I checked in on Richard [name].  Pain is well controlled and goal clear for continued aggressive interventions.  Palliative will not continue to follow daily.    Please call or reconsult if we can be of further assistance in the care of Richard [name] moving forward.  Micheline Rough, MD [place] Palliative Medicine Team [phone]  NO CHARGE NOTE

## 2020-01-28 NOTE — Evaluation (Signed)
Occupational Therapy Evaluation Patient Details Name: Richard [name] MRN: [id number] DOB: [birth year] Today's Date: 01/28/2020    History of Present Illness 74 y.o. male presenting with worsening chronic hematuria and unstable angina. Patient s/p cystoscopy under anesthesia on 12/3. PMHx significant for bladder CA, chronic hematuria followed by urology, CAD s/p stent placement, HLD, DM II, CHF, HTN, and A-flutter.   Clinical Impression   PTA patient was living with his spouse and was independent with ADLs without DME. Patient currently presents with deficits in static/dynamic standing balance, activity tolerance, strength, and requires supplemental O2 via [place]. Patient was not on O2 at baseline. Patient would benefit from continued acute OT services in prep for safe d/c home to maximize safety and independence with self-care tasks, functional transfers, and household mobility. Recommendation for [facility] as patient needs to be Mod I since his wife is unable to provide physical assistance.     Follow Up Recommendations  Home health OT;Supervision/Assistance - 24 hour    Equipment Recommendations  None recommended by OT (Patient has necessary DME)    Recommendations for Other Services       Precautions / Restrictions Precautions Precautions: Fall Precaution Comments: 2L O2 via [place] Restrictions Weight Bearing Restrictions: No      Mobility Bed Mobility Overal bed mobility: Needs Assistance Bed Mobility: Supine to Sit;Sit to Supine     Supine to sit: Supervision;HOB elevated Sit to supine: Supervision;HOB elevated        Transfers Overall transfer level: Needs assistance Equipment used: Rolling walker (2 wheeled) Transfers: Sit to/from [facility] Sit to Stand: Min guard Stand pivot transfers: Min guard       General transfer comment: Cues for hand placement and safety with RW.     Balance Overall balance assessment: Needs assistance Sitting-balance support:  No upper extremity supported;Feet supported Sitting balance-Leahy Scale: Good Sitting balance - Comments: Maintained sitting balance at EOB for UE MMT   Standing balance support: Bilateral upper extremity supported Standing balance-Leahy Scale: Poor Standing balance comment: Reliant on BUE on RW.                            ADL either performed or assessed with clinical judgement   ADL Overall ADL's : Needs assistance/impaired     Grooming: Set up;Sitting           Upper Body Dressing : Set up;Sitting   Lower Body Dressing: Minimal assistance;Sit to/from stand Lower Body Dressing Details (indicate cue type and reason): Min A to don/doff footwear seated EOB.  Toilet Transfer: Designer, television/film set Details (indicate cue type and reason): Simulated with stand-pivot transfer to EOB with use of RW.          Functional mobility during ADLs: Min guard;Rolling walker General ADL Comments: Patient fatigued from radiation but in agreement with OT assessment.     Vision Baseline Vision/History: Wears glasses Wears Glasses: At all times Patient Visual Report: No change from baseline Vision Assessment?: No apparent visual deficits     Perception     Praxis      Pertinent Vitals/Pain Pain Assessment: No/denies pain     Hand Dominance Right   Extremity/Trunk Assessment Upper Extremity Assessment Upper Extremity Assessment: Overall WFL for tasks assessed   Lower Extremity Assessment Lower Extremity Assessment: Generalized weakness   Cervical / Trunk Assessment Cervical / Trunk Assessment: Normal   Communication Communication Communication: No difficulties   Cognition Arousal/Alertness: Awake/alert Behavior During  Therapy: WFL for tasks assessed/performed Overall Cognitive Status: Within Functional Limits for tasks assessed                                     General Comments  SpO2 96% on 2L via [place]. SpO2 97% on 2L with activity.      Exercises     Shoulder Instructions      Home Living Family/patient expects to be discharged to:: Private residence Living Arrangements: Spouse/significant other Available Help at Discharge: Family Type of Home: House Home Access: Stairs to enter;Ramped entrance Entrance Stairs-Number of Steps: 2 Entrance Stairs-Rails: None Home Layout: One level     Bathroom Shower/Tub: Teacher, early years/pre: Handicapped height     Home Equipment: Radio producer - single point   Additional Comments: + assorted equipment from other family members- "I got it all"      Prior Functioning/Environment Level of Independence: Independent        Comments: retired Administrator; garden        OT Problem List: Decreased activity tolerance;Impaired balance (sitting and/or standing);Decreased safety awareness;Decreased knowledge of use of DME or AE      OT Treatment/Interventions: Self-care/ADL training;Therapeutic exercise;Energy conservation;DME and/or AE instruction;Therapeutic activities;Patient/family education;Balance training    OT Goals(Current goals can be found in the care plan section) Acute Rehab OT Goals Patient Stated Goal: less pain. get stronger OT Goal Formulation: With patient Time For Goal Achievement: 02/11/20 Potential to Achieve Goals: Good ADL Goals Pt Will Perform Lower Body Dressing: with modified independence;sit to/from stand Pt Will Perform Toileting - Clothing Manipulation and hygiene: with modified independence;sit to/from stand Additional ADL Goal #1: Patient will complete ADL tranfsers with use of RW and SpO2 >92% on RA.  OT Frequency: Min 2X/week   Barriers to D/C:            Co-evaluation              AM-PAC OT "6 Clicks" Daily Activity     Outcome Measure Help from another person eating meals?: None Help from another person taking care of personal grooming?: A Little Help from another person toileting, which includes using toliet, bedpan, or  urinal?: A Little Help from another person bathing (including washing, rinsing, drying)?: A Little Help from another person to put on and taking off regular upper body clothing?: None Help from another person to put on and taking off regular lower body clothing?: A Little 6 Click Score: 20   End of Session Equipment Utilized During Treatment: Gait belt;Rolling walker Nurse Communication: Mobility status  Activity Tolerance: Patient limited by fatigue Patient left: in bed;with call bell/phone within reach;with bed alarm set  OT Visit Diagnosis: Unsteadiness on feet (R26.81)                Time: 2956-2130 OT Time Calculation (min): 14 min Charges:  OT General Charges $OT Visit: 1 Visit OT Evaluation $OT Eval Moderate Complexity: 1 Mod  Alaisa Moffitt H. OTR/L Supplemental OT, Department of rehab services [phone]  Izen Petz R H. 01/28/2020, 10:59 AM

## 2020-01-29 ENCOUNTER — Ambulatory Visit
Admit: 2020-01-29 | Discharge: 2020-01-29 | Disposition: A | Discharge status: Home / Self Care | Payer: Medicare Other | Attending: Radiation Oncology | Admitting: Radiation Oncology

## 2020-01-29 DIAGNOSIS — R072 Precordial pain: Secondary | ICD-10-CM

## 2020-01-29 DIAGNOSIS — C671 Malignant neoplasm of dome of bladder: Secondary | ICD-10-CM | POA: Diagnosis not present

## 2020-01-29 LAB — BASIC METABOLIC PANEL
Anion gap: 9 (ref 5–15)
BUN: 41 mg/dL — ABNORMAL HIGH (ref 8–23)
CO2: 29 mmol/L (ref 22–32)
Calcium: 8.1 mg/dL — ABNORMAL LOW (ref 8.9–10.3)
Chloride: 100 mmol/L (ref 98–111)
Creatinine, Ser: 1.35 mg/dL — ABNORMAL HIGH (ref 0.61–1.24)
GFR, Estimated: 55 mL/min — ABNORMAL LOW (ref >60)
Glucose, Bld: 220 mg/dL — ABNORMAL HIGH (ref 70–99)
Potassium: 4.2 mmol/L (ref 3.5–5.1)
Sodium: 138 mmol/L (ref 135–145)

## 2020-01-29 LAB — CBC
HCT: 26.7 % — ABNORMAL LOW (ref 39.0–52.0)
Hemoglobin: 8.6 g/dL — ABNORMAL LOW (ref 13.0–17.0)
MCH: 28.7 pg (ref 26.0–34.0)
MCHC: 32.2 g/dL (ref 30.0–36.0)
MCV: 89 fL (ref 80.0–100.0)
Platelets: 207 10*3/uL (ref 150–400)
RBC: 3 MIL/uL — ABNORMAL LOW (ref 4.22–5.81)
RDW: 14.8 % (ref 11.5–15.5)
WBC: 7 10*3/uL (ref 4.0–10.5)
nRBC: 0 % (ref 0.0–0.2)

## 2020-01-29 LAB — GLUCOSE, CAPILLARY
Glucose-Capillary: 123 mg/dL — ABNORMAL HIGH (ref 70–99)
Glucose-Capillary: 104 mg/dL — ABNORMAL HIGH (ref 70–99)
Glucose-Capillary: 185 mg/dL — ABNORMAL HIGH (ref 70–99)

## 2020-01-29 MED ORDER — METOPROLOL SUCCINATE ER 100 MG PO TB24: 100.0000 mg | ORAL_TABLET | Freq: Every day | ORAL | 3 refills | Status: DC

## 2020-01-29 NOTE — Progress Notes (Signed)
Pt off of unit for procedure, noted.

## 2020-01-29 NOTE — Progress Notes (Addendum)
Progress Note  Patient Name: Richard [name] Date of Encounter: 01/29/2020  [facility] Cardiologist: Rozann Lesches, MD   Subjective   Denies any CP or SOB. No further hematuria  Inpatient Medications    Scheduled Meds: . amLODipine  2.5 mg Oral Daily  . atorvastatin  10 mg Oral Daily  . glipiZIDE  10 mg Oral QAC breakfast  . insulin aspart  0-5 Units Subcutaneous QHS  . insulin aspart  0-9 Units Subcutaneous TID WC  . insulin aspart  2 Units Subcutaneous TID WC  . isosorbide mononitrate  30 mg Oral QHS  . metoprolol succinate  100 mg Oral Daily   Continuous Infusions: . sodium chloride Stopped (01/26/20 0341)   PRN Meds: acetaminophen **OR** acetaminophen, albuterol, bisacodyl, guaiFENesin-dextromethorphan, HYDROcodone-acetaminophen, HYDROmorphone (DILAUDID) injection, melatonin, nitroGLYCERIN, ondansetron (ZOFRAN) IV   Vital Signs    Vitals:   01/28/20 0510 01/28/20 1306 01/28/20 2153 01/29/20 0623  BP: 138/78 104/72 140/60 (!) 154/79  Pulse: (!) 52 67 60 60  Resp: 18 18 18 18   Temp: 97.8 F (36.6 C) 98.6 F (37 C) 98.3 F (36.8 C) 97.7 F (36.5 C)  TempSrc: Oral  Oral Oral  SpO2: 99% 95% 100% 100%  Weight:      Height:        Intake/Output Summary (Last 24 hours) at 01/29/2020 1116 Last data filed at 01/29/2020 0600 Gross per 24 hour  Intake 56.89 ml  Output 700 ml  Net -643.11 ml   Last 3 Weights 01/26/2020 01/26/2020 01/25/2020  Weight (lbs) 179 lb 0.2 oz 179 lb 0.2 oz 181 lb 9.6 oz  Weight (kg) 81.2 kg 81.2 kg 82.373 kg      Telemetry    Largely sinus rhythm with occasional burst of SVT - Personally Reviewed  ECG    Sinus tachycardia with PVC on 01/23/2020 - Personally Reviewed  Physical Exam   GEN: No acute distress.   Neck: No JVD Cardiac: RRR, no murmurs, rubs, or gallops.  Respiratory: Clear to auscultation bilaterally with mild intermittent rhonchi. GI: Soft, nontender, non-distended  MS: No edema; No deformity. Neuro:  Nonfocal    Psych: Normal affect   Labs    High Sensitivity Troponin:   Recent Labs  Lab 01/23/20 0943 01/23/20 1235 01/24/20 1011  TROPONINIHS 29* 30* 39*      Chemistry Recent Labs  Lab 01/26/20 0317 01/26/20 0317 01/27/20 0622 01/28/20 0504 01/29/20 0931  NA 138   < > 135 138 138  K 5.2*   < > 5.9* 4.5 4.2  CL 103   < > 100 102 100  CO2 27   < > 23 28 29   GLUCOSE 118*   < > 389* 101* 220*  BUN 52*   < > 65* 60* 41*  CREATININE 3.02*   < > 2.35* 1.61* 1.35*  CALCIUM 9.1   < > 8.4* 8.3* 8.1*  PROT 5.5*  --  5.5* 5.3*  --   ALBUMIN 2.9*  --  2.8* 2.6*  --   AST 22  --  21 22  --   ALT 15  --  14 16  --   ALKPHOS 94  --  118 113  --   BILITOT 0.8  --  0.4 0.4  --   GFRNONAA 21*   < > 28* 45* 55*  ANIONGAP 8   < > 12 8 9    < > = values in this interval not displayed.     Hematology Recent Labs  Lab 01/27/20 0622 01/28/20 0504 01/29/20 0931  WBC 11.4* 9.2 7.0  RBC 2.95* 2.97* 3.00*  HGB 8.5* 8.5* 8.6*  HCT 25.9* 26.3* 26.7*  MCV 87.8 88.6 89.0  MCH 28.8 28.6 28.7  MCHC 32.8 32.3 32.2  RDW 14.6 14.9 14.8  PLT 50* 202 207    BNPNo results for input(s): BNP, PROBNP in the last 168 hours.   DDimer No results for input(s): DDIMER in the last 168 hours.   Radiology    No results found.  Cardiac Studies   Echo 01/24/2020 1. Left ventricular ejection fraction, by estimation, is 30 to 35%. The  left ventricle has moderately decreased function. The left ventricle  demonstrates global hypokinesis. The left ventricular internal cavity size  was mildly dilated. Left ventricular  diastolic parameters are consistent with Grade I diastolic dysfunction  (impaired relaxation). Elevated left atrial pressure.  2. Right ventricular systolic function is normal. The right ventricular  size is normal.  3. The mitral valve is normal in structure. Mild mitral valve  regurgitation. No evidence of mitral stenosis.  4. The aortic valve is tricuspid. Aortic valve regurgitation is  not  visualized. Mild aortic valve sclerosis is present, with no evidence of  aortic valve stenosis.  5. The inferior vena cava is normal in size with greater than 50%  respiratory variability, suggesting right atrial pressure of 3 mmHg.   Patient Profile     74 y.o. male with PMH of CAD s/p PCI to RCA 2007, LAD and OM2 2019, newly diagnosed systolic CHF, PAF not on AC, and bladder cancer who presented with grass hematuria and clotting. Found to have AKI with Cr >3 and sign of hydronephrosis. Patient was transferred to [facility] hospital for cystoscopy and palliative radiation. Cardiology consulted with chest pain with minimally elevated but flat troponin  Assessment & Plan    1. Chest pain  - Hs trop 29 --> 30 --> 39  - not a candidate for DAPT due to hematuria and anemia, continue medical management with Imdur, amlodipine, BB and statin. Aspirin on hold given hematuria  2. Chronic systolic and diastolic CHF  - Echo 05/100 showed EF 60-65%  - Echo obtained in 12/2019 showed EF 25-30%.  - Echo obtained this admission showed EF 30-35%, global hypokinesis and grade 1 DD.   - euvolemic on exam today. Entresto on hold  3. AKI: likely post renal due to hematuria and clotting  - s/p cystoscopy and resection of bladder tumor, clot evacuation, R ureteral stent removal on 01/26/2020 by Dr. Jeffie Pollock  - Cr peaked at 3.35, improved to 1.3 by the time of discharge.  - no further hematuria, renal function improving.   4. PSVT: occasional bursts of SVT on telemetry, not prolonged, toprol XL increased to 100mg  daily during this admission   [facility] will sign off.   Medication Recommendations:   Continue amlodipine 2.5mg  daily (unchanged) Continue lipitor 10mg  daily (unchanged) Continue Imdur 30mg  daily (unchanged) Continue metoprolol succinate 100mg  daily (increased during this admission) Ok with PRN torsemide at home (unchanged)  Hold Entresto Hold Aspirin  Other recommendations (labs, testing,  etc):  BMET on follow up, if no further hematuria and clotting issue and renal function continue to improve, will continue restart Entresto in the future  Follow up as an outpatient:  Cecilie Kicks 12/13 at 1PM in [facility] office  For questions or updates, please contact [place] Please consult [email or web address] for contact info under    Signed, Almyra Deforest, PA  01/29/2020, 11:16 AM   As above, patient seen and examined.  He denies chest pain.  As outlined previously we will not proceed with further ischemia evaluation given significant hematuria and inability to anticoagulate.  He is not volume overloaded on examination.  Renal function is improving.  Would continue to hold Entresto and aspirin for now.  He can take Demadex as needed.  He has follow-up in 6 days.  We will check potassium and renal function at that time and can reinitiate Entresto if renal function continues to improve. Kirk Ruths

## 2020-01-29 NOTE — Plan of Care (Signed)
  Problem: Education: Goal: Ability to demonstrate management of disease process will improve Outcome: Progressing Goal: Ability to verbalize understanding of medication therapies will improve Outcome: Progressing Goal: Individualized Educational Video(s) Outcome: Progressing   Problem: Cardiac: Goal: Ability to achieve and maintain adequate cardiopulmonary perfusion will improve Outcome: Progressing   Problem: Education: Goal: Understanding of cardiac disease, CV risk reduction, and recovery process will improve Outcome: Progressing Goal: Individualized Educational Video(s) Outcome: Progressing   Problem: Education: Goal: Individualized Educational Video(s) Outcome: Progressing   Problem: Cardiac: Goal: Ability to achieve and maintain adequate cardiovascular perfusion will improve Outcome: Progressing   Problem: Urinary Elimination: Goal: Ability to avoid or minimize complications of infection will improve Outcome: Progressing   Problem: Health Behavior/Discharge Planning: Goal: Identification of resources available to assist in meeting health care needs will improve Outcome: Progressing   Problem: Bowel/Gastric: Goal: Gastrointestinal status for postoperative course will improve Outcome: Progressing   Problem: Education: Goal: Knowledge of the prescribed therapeutic regimen will improve Outcome: Progressing

## 2020-01-29 NOTE — Discharge Summary (Signed)
Physician Discharge Summary  Richard [name] XTG:[id number] DOB: 1945-06-18 DOA: 01/23/2020  PCP: [facility] [place], MD  Admit date: 01/23/2020 Discharge date: 01/29/2020  Admitted From: home Disposition:  [place]  Recommendations for Outpatient Follow-up:  1. Follow up with PCP in 1-2 weeks 2. Please obtain BMP/CBC in one week 3. Follow-up with cardiology soon 4. Please follow up on the following pending results:XRT  Home Health:Yes  Equipment/Devices: none  Discharge Condition: Stable Code Status:   Code Status: Full Code Diet recommendation:  Diet Order             Diet Carb Modified           Diet Carb Modified Fluid consistency: Thin; Room service appropriate? Yes  Diet effective now                   Brief/Interim Summary: 74yom with T2DM, HTN, CAD with history of stent/CHF, HLD, A-flutter, bladder cancer, chronic hematuria followed by Dr. Alyson Ingles from urology who has had multiple blood clots and hematuria in his Foley catheter and also developed chest pain between the shoulder blades with radiation to the jaw relieved with nitroglycerin and was seen in the ED at any pain, blood work showed hyperglycemia creatinine 2.0 hemoglobin 7.1 troponin positive at 29 cardiology, urology was consulted and was admitted at [facility].Per report for the last several weeks,patient was having progressively worsening and difficult to control hematuria. He was seen by urologist Dr. Alyson Ingles in the office on 11/30, had a cystoscopy done.  But unfortunately there was too much blood present for any treatment.  Foley catheter was placed. He was discharged home with arrangements made for him to have a cystoscopy done under anesthesia on 12/2.  The patient has been experiencing multiple blood clots and requires frequent flushing of the Foley catheter At [place] was being treated for acute blood loss anemia/hematuria/ACS with slightly positive troponin, unable to be treated with heparin due to  ongoing hematuria.He has been on continuous bladder irrigation. He also was having issue with hypoxia and was seen by PCCM. 12/4 patient was transferred to [facility] for his ongoing neurological follow-up and procedure. 12/4-Underwent cystoscopy, with transurethral resection of bladder tumor large, clot evacuation bilateral retrograde pyelogram with right for ureteral stent removal-and on the left with Foley catheter off CBI. Patient was subsequently seen by radiation oncologist and was discharged on radiation therapy for his bladder tumor, he is off Foley catheter voiding well urine is clear.  His hemoglobin has stabilized, renal function has improved. He is weak and deconditioned and setting up [place] HE IS OFF O2 and did well on walking  Discharge Diagnoses:   Large Multifocal bladder tumor, with significant hematuria leading to acute blood loss anemia: Had been on CBI- transferred to [facility] from [facility] for urology plan- 12/4 s/p cystoscopy, with transurethral resection of bladder tumor large, clot evacuation bilateral retrograde pyelogram with old rt ureteral stent removal.  Radiation oncology consulted and STARTED ON xrt 12/6. HE Will continue on daily radiation therapy and follow-up with them as outpatient.    Unstable angina with known CAD with prior intervention, troponin slightly positive flat 29-30-39: Not candidate for cardiac cath/antiplatelet therapy due to his ongoing hematuria/bladder tumor/anemia.  Continue with statin, beta-blocker Imdur as per cardiology, appreciate input-aspirin is acute blood loss anemia and hematuria.  He will need to follow-up with cardiology and I have asked them to see prior to d/c.   Chronic systolic and diastolic CHF Echo this admission with EF  30 to 35%, global hypokinesia and grade 1 DD, EF was 25 to 30% on 12/2019 and 60 to 65% on July 2021.  No ACE ARB Aldactone Entresto , torsemide due to renal failure.  He will need close follow-up to resume dose those meds if  cards do not resume on d/c.   Cardiac meds "Continue amlodipine 2.5mg  daily (unchanged) Continue lipitor 10mg  daily (unchanged) Continue Imdur 30mg  daily (unchanged) Continue metoprolol succinate 100mg  daily (increased during this admission) Ok with PRN torsemide at home (unchanged) Hold Entresto Hold Aspirin   History of paroxysmal atrial fibrillation status post prior ablation not a candidate for anticoagulation.  In sinus rhythm.  Monitor in telemetry   AKI on ckd stage IIIa: baseline creat 1.2 in sept 2021-1.72 previously in July 2021. multifactorial with blood loss anemia, hydronephrosis-obstructive nephropathy due to urinary blood clots and bladder tumor.  Renal function improved creatinine at 1.3 BUN 41.  Advised to check BMP with PCP next week. Recent Labs  Lab 01/25/20 0311 01/26/20 0317 01/27/20 0622 01/28/20 0504 01/29/20 0931  BUN 44* 52* 65* 60* 41*  CREATININE 3.35* 3.02* 2.35* 1.61* 1.35*   Hyperkalemia: S/P Kayexalate and potassium has normalized.     Acute hypoxic respiratory failure/chronic tobacco abuse: Multifactorial: CHF, atelectasis and from sedation. Likely has underlying pulmonary disease due to his longstanding smoking history 54 pack years.  Weaned off oxygen and doing well on RA. will need outpatient follow-up and PFT.     Acute on chronic blood loss anemia with acute and chronic hematuria: Patient IS S/P 2 u PRBC 12/1.  Hemoglobin stable at 8.6  T2DM with uncontrolled hyperglycemia, hemoglobin A1c 9.612/1.   Hyperglycemia has improved resume his home glipizide, off Lantus.  Follow-up with PCP.  I have asked him and his wife to monitor blood sugar 4 times a day at home. Recent Labs  Lab 01/28/20 1704 01/28/20 2150 01/29/20 0406 01/29/20 0714 01/29/20 1148  GLUCAP 103* 169* 123* 104* 185*   Essential hypertension: Well-controlled.  Continue amlodipine, imdur and metoprolol.    HLD-continue statin.   Active ongoing smoking discussed about  cessation. Stenosis of left carotid artery-cont statins   PNT:IRWE CODE.  With multiple medical comorbidities prognosis remains to be seen, palliative care has been consulted advised after meeting to continue current scope of treatment, full code and outpatient palliative care follow-up.  Consults: cardiology, pulmonary.    Subjective: Alert awake oriented no new complaints.  Voiding well urine is clean.  Discharge Exam: Vitals:   01/29/20 0623 01/29/20 1227  BP: (!) 154/79 140/78  Pulse: 60 66  Resp: 18 20  Temp: 97.7 F (36.5 C) 97.7 F (36.5 C)  SpO2: 100% 100%   General: Pt is alert, awake, not in acute distress Cardiovascular: RRR, S1/S2 +, no rubs, no gallops Respiratory: CTA bilaterally, no wheezing, no rhonchi Abdominal: Soft, NT, ND, bowel sounds + Extremities: no edema, no cyanosis  Discharge Instructions  Discharge Instructions     Diet Carb Modified   Complete by: As directed    Discharge instructions   Complete by: As directed    Please call call MD or return to ER for similar or worsening recurring problem that brought you to hospital or if any fever,nausea/vomiting,abdominal pain, uncontrolled pain, chest pain,  shortness of breath or any other alarming symptoms.  Please follow-up your doctor as instructed in a week time and call the office for appointment.  Please avoid alcohol, smoking, or any other illicit substance and maintain healthy habits  including taking your regular medications as prescribed.  You were cared for by a hospitalist during your hospital stay. If you have any questions about your discharge medications or the care you received while you were in the hospital after you are discharged, you can call the unit and ask to speak with the hospitalist on call if the hospitalist that took care of you is not available.  Once you are discharged, your primary care physician will handle any further medical issues. Please note that NO REFILLS for any  discharge medications will be authorized once you are discharged, as it is imperative that you return to your primary care physician (or establish a relationship with a primary care physician if you do not have one) for your aftercare needs so that they can reassess your need for medications and monitor your lab values.   Increase activity slowly   Complete by: As directed       Allergies as of 01/29/2020       Reactions   Statins    Leg pain, tolerates lipitor         Medication List     STOP taking these medications    aspirin EC 81 MG tablet   aspirin-sod bicarb-citric acid 325 MG Tbef tablet Commonly known as: ALKA-SELTZER   Entresto 49-51 MG Generic drug: sacubitril-valsartan   GOODY HEADACHE PO       TAKE these medications    amLODipine 2.5 MG tablet Commonly known as: NORVASC Take 2.5 mg by mouth daily.   atorvastatin 10 MG tablet Commonly known as: LIPITOR Take 10 mg by mouth daily.   B-D ULTRAFINE III SHORT PEN 31G X 8 MM Misc Generic drug: Insulin Pen Needle Inject into the skin.   glipiZIDE 10 MG 24 hr tablet Commonly known as: GLUCOTROL XL Take 10 mg by mouth 2 (two) times daily.   isosorbide mononitrate 30 MG 24 hr tablet Commonly known as: IMDUR Take 1 tablet (30 mg total) by mouth at bedtime.   metFORMIN 1000 MG tablet Commonly known as: GLUCOPHAGE Take 1,000 mg by mouth 2 (two) times daily with a meal.   metoprolol succinate 100 MG 24 hr tablet Commonly known as: TOPROL-XL Take 1 tablet (100 mg total) by mouth daily. Take with or immediately following a meal. Start taking on: January 30, 2020 What changed:   medication strength  how much to take  when to take this   mirabegron ER 25 MG Tb24 tablet Commonly known as: MYRBETRIQ Take 1 tablet (25 mg total) by mouth daily.   nitroGLYCERIN 0.4 MG SL tablet Commonly known as: NITROSTAT Place 1 tablet (0.4 mg total) under the tongue every 5 (five) minutes as needed for chest pain.    oxyCODONE-acetaminophen 5-325 MG tablet Commonly known as: Percocet Take 1 tablet by mouth every 4 (four) hours as needed for moderate pain or severe pain.   torsemide 20 MG tablet Commonly known as: DEMADEX Take 20 mg by mouth daily as needed (swelling).        Follow-up Information     [facility] Follow up.   Specialty: Home Health Services Why: [facility] physical therapy Contact information: [place] [address] [phone]         [name], FNP Follow up in 1 week(s).   Specialty: Family Medicine Contact information: [facility] [address] [place]  [address] [phone]  Irine Seal, MD Follow up in 1 week(s).   Specialty: Urology Contact information: [place] [address] [phone]         Isaiah Serge, NP Follow up on 02/04/2020.   Specialties: Cardiology, Radiology Why: @ 1PM. post hospital cardiology follow up with Dr. [facility] Nurse Practitioner Contact information: [place] [address] [phone]                Allergies  Allergen Reactions  . Statins     Leg pain, tolerates lipitor     The results of significant diagnostics from this hospitalization (including imaging, microbiology, ancillary and laboratory) are listed below for reference.    Microbiology: Recent Results (from the past 240 hour(s))  Resp Panel by RT-PCR (Flu A&B, Covid) Nasopharyngeal Swab     Status: None   Collection Time: 01/23/20  1:02 PM   Specimen: Nasopharyngeal Swab; Nasopharyngeal(NP) swabs in vial transport medium  Result Value Ref Range Status   SARS Coronavirus 2 by RT PCR NEGATIVE NEGATIVE Final    Comment: (NOTE) SARS-CoV-2 target nucleic acids are NOT DETECTED.  The SARS-CoV-2 RNA is generally detectable in upper respiratory specimens during the acute phase of infection. The lowest concentration of  SARS-CoV-2 viral copies this assay can detect is 138 copies/mL. A negative result does not preclude SARS-Cov-2 infection and should not be used as the sole basis for treatment or other patient management decisions. A negative result may occur with  improper specimen collection/handling, submission of specimen other than nasopharyngeal swab, presence of viral mutation(s) within the areas targeted by this assay, and inadequate number of viral copies(<138 copies/mL). A negative result must be combined with clinical observations, patient history, and epidemiological information. The expected result is Negative.  Fact Sheet for Patients:  EntrepreneurPulse.com.au  Fact Sheet for Healthcare Providers:  IncredibleEmployment.be  This test is no t yet approved or cleared by the [place] FDA and  has been authorized for detection and/or diagnosis of SARS-CoV-2 by FDA under an Emergency Use Authorization (EUA). This EUA will remain  in effect (meaning this test can be used) for the duration of the COVID-19 declaration under Section 564(b)(1) of the Act, 21 U.S.C.section 360bbb-3(b)(1), unless the authorization is terminated  or revoked sooner.       Influenza A by PCR NEGATIVE NEGATIVE Final   Influenza B by PCR NEGATIVE NEGATIVE Final    Comment: (NOTE) The Xpert Xpress SARS-CoV-2/FLU/RSV plus assay is intended as an aid in the diagnosis of influenza from Nasopharyngeal swab specimens and should not be used as a sole basis for treatment. Nasal washings and aspirates are unacceptable for Xpert Xpress SARS-CoV-2/FLU/RSV testing.  Fact Sheet for Patients: EntrepreneurPulse.com.au  Fact Sheet for Healthcare Providers: IncredibleEmployment.be  This test is not yet approved or cleared by the [place] FDA and has been authorized for detection and/or diagnosis of SARS-CoV-2 by FDA under an Emergency Use  Authorization (EUA). This EUA will remain in effect (meaning this test can be used) for the duration of the COVID-19 declaration under Section 564(b)(1) of the Act, 21 U.S.C. section 360bbb-3(b)(1), unless the authorization is terminated or revoked.  Performed at [facility], [address], [place], [place] [address]     Procedures/Studies: . CT ABDOMEN PELVIS WO CONTRAST .  [name] Result Date: 01/25/2020 . CLINICAL DATA:  Bladder cancer.  Flank pain. EXAM: CT ABDOMEN AND PELVIS WITHOUT CONTRAST TECHNIQUE: Multidetector CT imaging of the abdomen and pelvis was performed following the  standard protocol without IV contrast. COMPARISON:  September 16, 2019. FINDINGS: Lower chest: No acute abnormality. Hepatobiliary: No focal liver abnormality is seen. No gallstones, gallbladder wall thickening, or biliary dilatation. Pancreas: Unremarkable. No pancreatic ductal dilatation or surrounding inflammatory changes. Spleen: Normal in size without focal abnormality. Adrenals/Urinary Tract: Adrenal glands appear normal. Mild bilateral perinephric stranding is noted. Right ureteral stent is in grossly good position. Mild bilateral hydronephrosis is noted. Foley catheter is noted within distended urinary bladder. Large amount of blood clot is noted within the bladder lumen. Mild wall thickening is seen involving the left side of the urinary bladder likely reflecting postoperative change. Stomach/Bowel: Stomach is within normal limits. Appendix appears normal. No evidence of bowel wall thickening, distention, or inflammatory changes. Vascular/Lymphatic: Aortic atherosclerosis. No enlarged abdominal or pelvic lymph nodes. Reproductive: Prostate is unremarkable. Other: Mild anasarca is noted. Small fluid-filled left inguinal hernia is noted. Musculoskeletal: No acute or significant osseous findings. IMPRESSION: 1. Right ureteral stent is in grossly good position. Mild bilateral hydronephrosis is noted. 2. Large amount of blood  clot is noted within the bladder lumen. Mild wall thickening is seen involving the left side of the urinary bladder likely reflecting postoperative change. 3. Mild anasarca is noted. 4. Small fluid-filled left inguinal hernia. Aortic Atherosclerosis (ICD10-I70.0). Electronically Signed   By: Marijo Conception M.D.   On: 01/25/2020 09:34  .  [name] DG Chest Port 1 View .  [name] Result Date: 01/25/2020 . CLINICAL DATA:  Abnormal respiration, LEFT flank pain EXAM: PORTABLE CHEST 1 VIEW COMPARISON:  Portable exam 1233 hours compared to 01/23/2020 FINDINGS: Upper normal heart size with pulmonary vascular congestion. Mediastinal contours normal. Atherosclerotic calcification aorta. Minimal RIGHT basilar atelectasis. Lungs is otherwise clear. No pulmonary infiltrate, pleural effusion, or pneumothorax. Prior cervical spine fusion. IMPRESSION: Minimal RIGHT basilar atelectasis. Aortic Atherosclerosis (ICD10-I70.0). Electronically Signed   By: Lavonia Dana M.D.   On: 01/25/2020 13:00  .  [name] DG Chest Port 1 View .  [name] Result Date: 01/23/2020 . CLINICAL DATA:  Chest pain EXAM: PORTABLE CHEST 1 VIEW COMPARISON:  11/21/2019 FINDINGS: Artifact from EKG leads. There is no edema, consolidation, effusion, or pneumothorax. Normal heart size and mediastinal contours. IMPRESSION: No evidence of active disease. Electronically Signed   By: Monte Fantasia M.D.   On: 01/23/2020 11:47  .  [name] DG C-Arm 1-60 Min-No Report .  [name] Result Date: 01/26/2020 . Fluoroscopy was utilized by the requesting physician.  No radiographic interpretation.  .  . ECHOCARDIOGRAM COMPLETE .  [name] Result Date: 01/24/2020 .    ECHOCARDIOGRAM REPORT   Patient Name:   WESLEE FOGG Date of Exam: 01/24/2020 Medical Rec #:  [id number]      Height:       72.0 in Accession #:    [id number]     Weight:       169.2 lb Date of Birth:  [birth year]      BSA:          1.984 m Patient Age:    30 years       BP:           152/90 mmHg Patient Gender: M              HR:           86  bpm. Exam Location:  Inpatient Procedure: 2D Echo, Cardiac Doppler and Color Doppler Indications:    R94.31 Abnormal EKG  History:        Patient  has prior history of Echocardiogram examinations, most                 recent 12/26/2019. TIA, Arrythmias:Atrial Flutter,                 Signs/Symptoms:Dyspnea; Risk Factors:Hypertension, Diabetes,                 Dyslipidemia and Former Smoker. Cancer.  Sonographer:    Tiffany Dance Referring Phys: [id number] [place]  1. Left ventricular ejection fraction, by estimation, is 30 to 35%. The left ventricle has moderately decreased function. The left ventricle demonstrates global hypokinesis. The left ventricular internal cavity size was mildly dilated. Left ventricular diastolic parameters are consistent with Grade I diastolic dysfunction (impaired relaxation). Elevated left atrial pressure.  2. Right ventricular systolic function is normal. The right ventricular size is normal.  3. The mitral valve is normal in structure. Mild mitral valve regurgitation. No evidence of mitral stenosis.  4. The aortic valve is tricuspid. Aortic valve regurgitation is not visualized. Mild aortic valve sclerosis is present, with no evidence of aortic valve stenosis.  5. The inferior vena cava is normal in size with greater than 50% respiratory variability, suggesting right atrial pressure of 3 mmHg. FINDINGS  Left Ventricle: Left ventricular ejection fraction, by estimation, is 30 to 35%. The left ventricle has moderately decreased function. The left ventricle demonstrates global hypokinesis. The left ventricular internal cavity size was mildly dilated. There is no left ventricular hypertrophy. Left ventricular diastolic parameters are consistent with Grade I diastolic dysfunction (impaired relaxation). Elevated left atrial pressure. Right Ventricle: The right ventricular size is normal. Right ventricular systolic function is normal. Left Atrium: Left atrial size was normal in  size. Right Atrium: Right atrial size was normal in size. Pericardium: There is no evidence of pericardial effusion. Mitral Valve: The mitral valve is normal in structure. Mild mitral valve regurgitation. No evidence of mitral valve stenosis. Tricuspid Valve: The tricuspid valve is normal in structure. Tricuspid valve regurgitation is mild . No evidence of tricuspid stenosis. Aortic Valve: The aortic valve is tricuspid. Aortic valve regurgitation is not visualized. Mild aortic valve sclerosis is present, with no evidence of aortic valve stenosis. Pulmonic Valve: The pulmonic valve was not well visualized. Pulmonic valve regurgitation is not visualized. No evidence of pulmonic stenosis. Aorta: The aortic root is normal in size and structure. Venous: The inferior vena cava is normal in size with greater than 50% respiratory variability, suggesting right atrial pressure of 3 mmHg.  LEFT VENTRICLE PLAX 2D LVIDd:         5.20 cm  Diastology LVIDs:         4.60 cm  LV e' medial:    5.25 cm/s LV PW:         1.20 cm  LV E/e' medial:  18.9 LV IVS:        0.80 cm  LV e' lateral:   5.18 cm/s LVOT diam:     2.20 cm  LV E/e' lateral: 19.1 LV SV:         66 LV SV Index:   33 LVOT Area:     3.80 cm  RIGHT VENTRICLE             IVC RV Basal diam:  3.40 cm     IVC diam: 1.60 cm RV Mid diam:    1.90 cm RV S prime:     13.00 cm/s TAPSE (M-mode): 1.9 cm LEFT ATRIUM  Index       RIGHT ATRIUM           Index LA diam:        3.60 cm 1.81 cm/m  RA Area:     18.70 cm LA Vol (A2C):   86.6 ml 43.64 ml/m RA Volume:   54.70 ml  27.57 ml/m LA Vol (A4C):   44.0 ml 22.17 ml/m LA Biplane Vol: 60.2 ml 30.34 ml/m  AORTIC VALVE LVOT Vmax:   74.00 cm/s LVOT Vmean:  54.100 cm/s LVOT VTI:    0.173 m  AORTA Ao Root diam: 3.30 cm Ao Asc diam:  3.00 cm MITRAL VALVE MV Area (PHT): 3.42 cm    SHUNTS MV Decel Time: 222 msec    Systemic VTI:  0.17 m MV E velocity: 99.10 cm/s  Systemic Diam: 2.20 cm MV A velocity: 87.70 cm/s MV E/A ratio:   1.13 Kirk Ruths MD Electronically signed by Kirk Ruths MD Signature Date/Time: 01/24/2020/2:41:59 PM    Final    .   Labs: BNP (last 3 results) No results for input(s): BNP in the last 8760 hours. Basic Metabolic Panel: Recent Labs  Lab 01/25/20 0311 01/26/20 0317 01/27/20 0622 01/28/20 0504 01/29/20 0931  NA 137 138 135 138 138  K 5.4* 5.2* 5.9* 4.5 4.2  CL 99 103 100 102 100  CO2 23 27 23 28 29   GLUCOSE 267* 118* 389* 101* 220*  BUN 44* 52* 65* 60* 41*  CREATININE 3.35* 3.02* 2.35* 1.61* 1.35*  CALCIUM 9.0 9.1 8.4* 8.3* 8.1*  MG 1.6* 2.3  --   --   --    Liver Function Tests: Recent Labs  Lab 01/25/20 0311 01/26/20 0317 01/27/20 0622 01/28/20 0504  AST 22 22 21 22   ALT 15 15 14 16   ALKPHOS 97 94 118 113  BILITOT 1.0 0.8 0.4 0.4  PROT 5.8* 5.5* 5.5* 5.3*  ALBUMIN 3.2* 2.9* 2.8* 2.6*   No results for input(s): LIPASE, AMYLASE in the last 168 hours. No results for input(s): AMMONIA in the last 168 hours. CBC: Recent Labs  Lab 01/24/20 1011 01/25/20 0311 01/25/20 1405 01/26/20 0317 01/27/20 0622 01/28/20 0504 01/29/20 0931  WBC 10.2   < > 9.1 12.2* 11.4* 9.2 7.0  NEUTROABS 8.1*  --  7.0  --   --   --   --   HGB 9.2*   < > 7.3* 9.1* 8.5* 8.5* 8.6*  HCT 28.8*   < > 23.6* 28.7* 25.9* 26.3* 26.7*  MCV 87.3   < > 88.4 88.9 87.8 88.6 89.0  PLT 287   < > 213 199 50* 202 207   < > = values in this interval not displayed.   Cardiac Enzymes: No results for input(s): CKTOTAL, CKMB, CKMBINDEX, TROPONINI in the last 168 hours. BNP: Invalid input(s): POCBNP CBG: Recent Labs  Lab 01/28/20 1704 01/28/20 2150 01/29/20 0406 01/29/20 0714 01/29/20 1148  GLUCAP 103* 169* 123* 104* 185*   D-Dimer No results for input(s): DDIMER in the last 72 hours. Hgb A1c No results for input(s): HGBA1C in the last 72 hours. Lipid Profile No results for input(s): CHOL, HDL, LDLCALC, TRIG, CHOLHDL, LDLDIRECT in the last 72 hours. Thyroid function studies No results for  input(s): TSH, T4TOTAL, T3FREE, THYROIDAB in the last 72 hours.  Invalid input(s): FREET3 Anemia work up No results for input(s): VITAMINB12, FOLATE, FERRITIN, TIBC, IRON, RETICCTPCT in the last 72 hours. Urinalysis    Component Value Date/Time   COLORURINE RED (A)  01/15/2020 1532   APPEARANCEUR HAZY (A) 01/15/2020 1532   LABSPEC  01/15/2020 1532    TEST NOT REPORTED DUE TO COLOR INTERFERENCE OF URINE PIGMENT   PHURINE  01/15/2020 1532    TEST NOT REPORTED DUE TO COLOR INTERFERENCE OF URINE PIGMENT   GLUCOSEU (A) 01/15/2020 1532    TEST NOT REPORTED DUE TO COLOR INTERFERENCE OF URINE PIGMENT   HGBUR (A) 01/15/2020 1532    TEST NOT REPORTED DUE TO COLOR INTERFERENCE OF URINE PIGMENT   BILIRUBINUR (A) 01/15/2020 1532    TEST NOT REPORTED DUE TO COLOR INTERFERENCE OF URINE PIGMENT   KETONESUR (A) 01/15/2020 1532    TEST NOT REPORTED DUE TO COLOR INTERFERENCE OF URINE PIGMENT   PROTEINUR (A) 01/15/2020 1532    TEST NOT REPORTED DUE TO COLOR INTERFERENCE OF URINE PIGMENT   UROBILINOGEN 0.2 01/04/2007 1000   NITRITE (A) 01/15/2020 1532    TEST NOT REPORTED DUE TO COLOR INTERFERENCE OF URINE PIGMENT   LEUKOCYTESUR (A) 01/15/2020 1532    TEST NOT REPORTED DUE TO COLOR INTERFERENCE OF URINE PIGMENT   Sepsis Labs Invalid input(s): PROCALCITONIN,  WBC,  LACTICIDVEN Microbiology Recent Results (from the past 240 hour(s))  Resp Panel by RT-PCR (Flu A&B, Covid) Nasopharyngeal Swab     Status: None   Collection Time: 01/23/20  1:02 PM   Specimen: Nasopharyngeal Swab; Nasopharyngeal(NP) swabs in vial transport medium  Result Value Ref Range Status   SARS Coronavirus 2 by RT PCR NEGATIVE NEGATIVE Final    Comment: (NOTE) SARS-CoV-2 target nucleic acids are NOT DETECTED.  The SARS-CoV-2 RNA is generally detectable in upper respiratory specimens during the acute phase of infection. The lowest concentration of SARS-CoV-2 viral copies this assay can detect is 138 copies/mL. A negative result  does not preclude SARS-Cov-2 infection and should not be used as the sole basis for treatment or other patient management decisions. A negative result may occur with  improper specimen collection/handling, submission of specimen other than nasopharyngeal swab, presence of viral mutation(s) within the areas targeted by this assay, and inadequate number of viral copies(<138 copies/mL). A negative result must be combined with clinical observations, patient history, and epidemiological information. The expected result is Negative.  Fact Sheet for Patients:  EntrepreneurPulse.com.au  Fact Sheet for Healthcare Providers:  IncredibleEmployment.be  This test is no t yet approved or cleared by the [place] FDA and  has been authorized for detection and/or diagnosis of SARS-CoV-2 by FDA under an Emergency Use Authorization (EUA). This EUA will remain  in effect (meaning this test can be used) for the duration of the COVID-19 declaration under Section 564(b)(1) of the Act, 21 U.S.C.section 360bbb-3(b)(1), unless the authorization is terminated  or revoked sooner.       Influenza A by PCR NEGATIVE NEGATIVE Final   Influenza B by PCR NEGATIVE NEGATIVE Final    Comment: (NOTE) The Xpert Xpress SARS-CoV-2/FLU/RSV plus assay is intended as an aid in the diagnosis of influenza from Nasopharyngeal swab specimens and should not be used as a sole basis for treatment. Nasal washings and aspirates are unacceptable for Xpert Xpress SARS-CoV-2/FLU/RSV testing.  Fact Sheet for Patients: EntrepreneurPulse.com.au  Fact Sheet for Healthcare Providers: IncredibleEmployment.be  This test is not yet approved or cleared by the [place] FDA and has been authorized for detection and/or diagnosis of SARS-CoV-2 by FDA under an Emergency Use Authorization (EUA). This EUA will remain in effect (meaning this test can be used) for  the duration of the COVID-19 declaration  under Section 564(b)(1) of the Act, 21 U.S.C. section 360bbb-3(b)(1), unless the authorization is terminated or revoked.  Performed at [facility], [address] [place], [place] [address]      Time coordinating discharge: 35 minutes  SIGNED: Antonieta Pert, MD  [facility] 01/29/2020, 1:54 PM  If 7PM-7AM, please contact night-coverage [email or web address]

## 2020-01-29 NOTE — Progress Notes (Signed)
Pt arrived back to unit is stable condition after procedure, noted.

## 2020-01-29 NOTE — Progress Notes (Signed)
3 Days Post-Op  Subjective: Richard [name] is doing well with clear urine and he tolerated his first radiation treatment.   ROS:  Review of Systems  All other systems reviewed and are negative.   Anti-infectives: Anti-infectives (From admission, onward)   Start     Dose/Rate Route Frequency Ordered Stop   01/26/20 1008  ceFAZolin (ANCEF) 2-4 GM/100ML-% IVPB       Note to Pharmacy: Valinda Party   : cabinet override      01/26/20 1008 01/26/20 1045   01/24/20 0935  ceFAZolin (ANCEF) IVPB 2g/100 mL premix        2 g 200 mL/hr over 30 Minutes Intravenous 30 min pre-op 01/24/20 0935 01/26/20 1109      Current Facility-Administered Medications  Medication Dose Route Frequency Provider Last Rate Last Admin  . 0.9 %  sodium chloride infusion   Intravenous Continuous Minor, Grace Bushy, NP   Stopped at 01/26/20 0341  . acetaminophen (TYLENOL) tablet 650 mg  650 mg Oral Q6H PRN Wynetta Emery, Clanford L, MD   650 mg at 01/23/20 2223   Or  . acetaminophen (TYLENOL) suppository 650 mg  650 mg Rectal Q6H PRN Johnson, Clanford L, MD      . albuterol (VENTOLIN HFA) 108 (90 Base) MCG/ACT inhaler 2 puff  2 puff Inhalation Q4H PRN Antonieta Pert, MD   2 puff at 01/28/20 1518  . amLODipine (NORVASC) tablet 2.5 mg  2.5 mg Oral Daily Johnson, Clanford L, MD   2.5 mg at 01/28/20 1010  . atorvastatin (LIPITOR) tablet 10 mg  10 mg Oral Daily Johnson, Clanford L, MD   10 mg at 01/28/20 1010  . bisacodyl (DULCOLAX) EC tablet 5 mg  5 mg Oral Daily PRN Johnson, Clanford L, MD      . glipiZIDE (GLUCOTROL) tablet 10 mg  10 mg Oral QAC breakfast Kc, Ramesh, MD   10 mg at 01/28/20 1010  . guaiFENesin-dextromethorphan (ROBITUSSIN DM) 100-10 MG/5ML syrup 5 mL  5 mL Oral Q4H PRN Antonieta Pert, MD   5 mL at 01/28/20 2153  . HYDROcodone-acetaminophen (NORCO/VICODIN) 5-325 MG per tablet 1 tablet  1 tablet Oral Q6H PRN Zierle-Ghosh, Asia B, DO   1 tablet at 01/27/20 1507  . HYDROmorphone (DILAUDID) injection 1 mg  1 mg Intravenous Q2H  PRN Dahal, Marlowe Aschoff, MD   1 mg at 01/26/20 1510  . insulin aspart (novoLOG) injection 0-5 Units  0-5 Units Subcutaneous QHS Murlean Iba, MD   3 Units at 01/26/20 2107  . insulin aspart (novoLOG) injection 0-9 Units  0-9 Units Subcutaneous TID WC Johnson, Clanford L, MD   1 Units at 01/27/20 1235  . insulin aspart (novoLOG) injection 2 Units  2 Units Subcutaneous TID WC Antonieta Pert, MD   2 Units at 01/28/20 1720  . insulin glargine (LANTUS) injection 10 Units  10 Units Subcutaneous Daily Antonieta Pert, MD   10 Units at 01/28/20 1011  . isosorbide mononitrate (IMDUR) 24 hr tablet 30 mg  30 mg Oral QHS Johnson, Clanford L, MD   30 mg at 01/28/20 2153  . melatonin tablet 6 mg  6 mg Oral QHS PRN Lang Snow, FNP   6 mg at 01/29/20 0004  . metoprolol succinate (TOPROL-XL) 24 hr tablet 100 mg  100 mg Oral Daily Buford Dresser, MD   100 mg at 01/28/20 1010  . nitroGLYCERIN (NITROSTAT) SL tablet 0.4 mg  0.4 mg Sublingual Q5 min PRN Murlean Iba, MD      .  ondansetron (ZOFRAN) injection 4 mg  4 mg Intravenous Q6H PRN Dahal, Marlowe Aschoff, MD   4 mg at 01/25/20 0949     Objective: Vital signs in last 24 hours: Temp:  [97.7 F (36.5 C)-98.6 F (37 C)] 97.7 F (36.5 C) (12/07 0623) Pulse Rate:  [60-67] 60 (12/07 0623) Resp:  [18] 18 (12/07 0623) BP: (104-154)/(60-79) 154/79 (12/07 0623) SpO2:  [95 %-100 %] 100 % (12/07 0623)  Intake/Output from previous day: 12/06 0701 - 12/07 0700 In: 56.9 [I.V.:56.9] Out: 1100 [Urine:1100] Intake/Output this shift: No intake/output data recorded.   Physical Exam Vitals reviewed.  Constitutional:      Appearance: Normal appearance.  Neurological:     Mental Status: He is alert.     Lab Results:  Recent Labs    01/27/20 0622 01/28/20 0504  WBC 11.4* 9.2  HGB 8.5* 8.5*  HCT 25.9* 26.3*  PLT 50* 202   BMET Recent Labs    01/27/20 0622 01/28/20 0504  NA 135 138  K 5.9* 4.5  CL 100 102  CO2 23 28  GLUCOSE 389* 101*  BUN 65*  60*  CREATININE 2.35* 1.61*  CALCIUM 8.4* 8.3*   PT/INR No results for input(s): LABPROT, INR in the last 72 hours. ABG No results for input(s): PHART, HCO3 in the last 72 hours.  Invalid input(s): PCO2, PO2  Studies/Results: No results found.   Assessment and Plan: Muscle invasive bladder cancer with hemorrhage.   He continues to do well following Clot evac and TURBT with right stent removal with clear urine.  He has had his first radiation treatment and tolerated that well.  He will need f/u in 2-3 weeks in the office.   AKI.  The Cr continues to decline and is 1.61 today.        LOS: 6 days    Irine Seal 01/29/2020 [phone] ID: [name], male   DOB: July 26, 1945, 74 y.o.   MRN: [id number]

## 2020-01-29 NOTE — Progress Notes (Signed)
Pt discharged at this time. Pt A&O times 4, stable and no c/o pain or discomfort. PIV removed and telemetry box. Pt discharged home with his wife noted.

## 2020-01-29 NOTE — Progress Notes (Signed)
SATURATION QUALIFICATIONS: (This note is used to comply with regulatory documentation for home oxygen)  Patient Saturations on Room Air at Rest = 99%  Patient Saturations on Room Air while Ambulating = 95%  Patient Saturations on 2 Liters of oxygen while Ambulating = 100%  Please briefly explain why patient needs home oxygen:

## 2020-01-30 ENCOUNTER — Telehealth: Payer: Self-pay | Admitting: Cardiology

## 2020-01-30 ENCOUNTER — Ambulatory Visit: Payer: Medicare Other

## 2020-01-30 ENCOUNTER — Telehealth: Payer: Self-pay | Admitting: Radiation Oncology

## 2020-01-30 ENCOUNTER — Ambulatory Visit: Payer: Medicare Other | Admitting: Urology

## 2020-01-30 LAB — SURGICAL PATHOLOGY

## 2020-01-30 NOTE — Telephone Encounter (Signed)
New message   [phone] if you cant reach at hospital  Patient was discharged from hospital yesterday and they changed his medication on his discharge paper it does not say if he should continue taking the Entresto ?

## 2020-01-30 NOTE — Telephone Encounter (Addendum)
Received message from radiation therapist on L1 that this patient's wife is requesting a return call. Phoned her to inquire.   She explains that her husband returned home yesterday after being hospitalized. She explains he is very fatigued and wishes to cancel radiation therapy for today. I verbalized understanding and reviewed effects of missing multiple treatments. His wife verbalized understanding and intent to ensure he is present for treatment tomorrow. Informed therapist on L1 via email that patient wishes to cancel treatment today since I was unable to reach them via phone.  She questions if him smelling and tasting smoke has to do with the effects of radiation therapy. She reports the patient hasn't smoked in one week. Explained this is unrelated to radiation. She verbalized understanding.   She reports her husband is voiding on his own without difficulty and his urinary catheter has been removed. She explains he hasn't seen any blood in his urine since Saturday.   Explained that Friday following his radiation treatment he will be asked to come around to nursing to see Dr. Tammi Klippel. Encouraged his wife to take part in this encounter and she confirmed she would.

## 2020-01-30 NOTE — Telephone Encounter (Signed)
Wife, Tasman Zapata advised that per D/C summary, entresto was stopped.  Verbalized understanding

## 2020-01-31 ENCOUNTER — Ambulatory Visit
Admission: RE | Admit: 2020-01-31 | Discharge: 2020-01-31 | Disposition: A | Discharge status: Home / Self Care | Payer: Medicare Other | Source: Ambulatory Visit | Attending: Radiation Oncology | Admitting: Radiation Oncology

## 2020-01-31 DIAGNOSIS — C671 Malignant neoplasm of dome of bladder: Secondary | ICD-10-CM | POA: Diagnosis not present

## 2020-01-31 DIAGNOSIS — Z510 Encounter for antineoplastic radiation therapy: Secondary | ICD-10-CM | POA: Diagnosis not present

## 2020-01-31 DIAGNOSIS — C679 Malignant neoplasm of bladder, unspecified: Secondary | ICD-10-CM | POA: Diagnosis not present

## 2020-01-31 NOTE — Progress Notes (Signed)
Subjective:  1. 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  All systems are negative except for pertinent findings as noted.   ROS  Allergies  Allergen Reactions  . Statins     Leg pain, tolerates lipitor     Outpatient Encounter Medications as of 02/01/2020  Medication Sig Note  . amLODipine (NORVASC) 2.5 MG tablet Take 2.5 mg by mouth daily. 01/23/2020: Pt did not mention this. Adding per pharmacy records. LF: 12/24/19 DS: 90  . atorvastatin (LIPITOR) 10 MG tablet Take 10 mg by mouth daily.   . B-D ULTRAFINE III SHORT PEN 31G X 8 MM MISC Inject into the skin.   [name] glipiZIDE (GLUCOTROL XL) 10 MG 24 hr tablet Take 10 mg by mouth 2 (two) times daily.   . isosorbide mononitrate (IMDUR) 30 MG 24 hr tablet Take 1 tablet (30 mg total) by mouth at bedtime.   . metFORMIN (GLUCOPHAGE) 1000 MG tablet Take 1,000 mg by mouth 2 (two) times daily with a meal.   . metoprolol succinate (TOPROL-XL) 100 MG 24 hr tablet Take 1 tablet (100 mg total) by mouth daily. Take with or immediately following a meal.   . mirabegron ER (MYRBETRIQ) 25 MG TB24 tablet Take 1 tablet (25 mg total) by mouth daily.   . nitroGLYCERIN (NITROSTAT) 0.4 MG SL tablet Place 1 tablet (0.4 mg total) under the tongue every 5 (five) minutes as needed for chest pain.   [name] oxyCODONE-acetaminophen (PERCOCET) 5-325 MG tablet Take 1 tablet by mouth every 4 (four) hours as needed for moderate pain or severe pain.   [name] torsemide (DEMADEX) 20 MG tablet Take 20 mg by mouth daily as needed (swelling).     No facility-administered encounter medications on file as of 02/01/2020.    Past Medical History:  Diagnosis Date  . Anemia 12/2019  . Bladder cancer ([place])   . CAD (coronary artery disease)    a. 2007 - DES x 2 proximal to mid RCA b. 09/2017: DES to mid LAD and OM2. Patent stents along RCA.   [name] CHF (congestive heart failure) ([place])   . Essential hypertension    . History of atrial flutter    Status post ablation 2008 - Dr. Caryl Comes  . History of transient ischemic attack (TIA)   . Mixed hyperlipidemia    Statin intolerance  . Pneumonia   . Type 2 diabetes mellitus (HCC)    A1C 11.5 06/2017    Past Surgical History:  Procedure Laterality Date  . BALLOON ANGIOPLASTY, ARTERY    . BICEPS TENDON REPAIR Left   . CARDIAC ELECTROPHYSIOLOGY MAPPING AND ABLATION    . CARPAL TUNNEL RELEASE Bilateral   . cervical neck fusion     x 4  . CORONARY ANGIOPLASTY    . CORONARY STENT INTERVENTION N/A 10/17/2017   Procedure: CORONARY STENT INTERVENTION;  Surgeon: Martinique, Peter M, MD;  Location: [facility];  Service: Cardiovascular;  Laterality: N/A;  . CORONARY STENT PLACEMENT     x 2  . CYSTOSCOPY W/ RETROGRADES Bilateral 10/08/2019   Procedure: CYSTOSCOPY WITH BILATERAL RETROGRADE PYELOGRAM;  Surgeon: Cleon Gustin, MD;  Location: [facility];  Service: Urology;  Laterality: Bilateral;  . CYSTOSCOPY W/ URETERAL STENT PLACEMENT Bilateral 01/26/2020   Procedure: CYSTOSCOPY WITH RETROGRADE PYELOGRAM bilateral right ureter stent removal;  Surgeon: Irine Seal, MD;  Location: [facility];  Service: Urology;  Laterality: Bilateral;  . CYSTOSCOPY WITH FULGERATION N/A 01/26/2020   Procedure: CYSTOSCOPY WITH FULGERATION, CLOT EVACUATION transurethral resection bladder tumor;  Surgeon: Irine Seal, MD;  Location: [facility];  Service: Urology;  Laterality: N/A;  . CYSTOSCOPY WITH URETHRAL DILATATION  10/08/2019   Procedure: CYSTOSCOPY WITH URETHRAL DILATATION;  Surgeon: Cleon Gustin, MD;  Location: [facility];  Service: Urology;;  . LEFT HEART CATH AND CORONARY ANGIOGRAPHY N/A 10/17/2017   Procedure: LEFT HEART CATH AND CORONARY ANGIOGRAPHY;  Surgeon: Martinique, Peter M, MD;  Location: [facility];  Service: Cardiovascular;  Laterality: N/A;  . ROTATOR CUFF REPAIR Right    x 4  . TIBIA FRACTURE SURGERY Left   . TRANSURETHRAL RESECTION OF BLADDER TUMOR N/A 10/08/2019    Procedure: TRANSURETHRAL RESECTION OF BLADDER TUMOR (TURBT);  Surgeon: Cleon Gustin, MD;  Location: [facility];  Service: Urology;  Laterality: N/A;    Social History   Socioeconomic History  . Marital status: Married    Spouse name: Not on file  . Number of children: 4  . Years of education: Not on file  . Highest education level: Not on file  Occupational History  . Occupation: truck Geophysicist/field seismologist    Comment: retired  Tobacco Use  . Smoking status: Current Every Day Smoker    Packs/day: 1.00    Years: 54.00    Pack years: 54.00    Types: Cigarettes  . Smokeless tobacco: Never Used  Vaping Use  . Vaping Use: Never used  Substance and Sexual Activity  . Alcohol use: No    Alcohol/week: 0.0 standard drinks    Comment: rare  . Drug use: No  . Sexual activity: Not Currently  Other Topics Concern  . Not on file  Social History Narrative  . Not on file   Social Determinants of Health   Financial Resource Strain: Low Risk   . Difficulty of Paying Living Expenses: Not hard at all  Food Insecurity: No Food Insecurity  . Worried About Charity fundraiser in the Last Year: Never true  . Ran Out of Food in the Last Year: Never true  Transportation Needs: No Transportation Needs  . Lack of Transportation (Medical): No  . Lack of Transportation (Non-Medical): No  Physical Activity: Inactive  . Days of Exercise per Week: 0 days  . Minutes of Exercise per Session: 0 min  Stress: No Stress Concern Present  . Feeling of Stress : Not at all  Social Connections: Moderately Isolated  . Frequency of Communication with Friends and Family: Three times a week  . Frequency of Social Gatherings with Friends and Family: Three times a week  . Attends Religious Services: Never  . Active Member of Clubs or Organizations: No  . Attends Archivist Meetings: Never  . Marital Status: Married  Human resources officer Violence: Not At Risk  . Fear of Current or Ex-Partner: No  . Emotionally  Abused: No  . Physically Abused: No  . Sexually Abused: No    Family History  Problem Relation Age of Onset  . Diabetes Mother   . Heart disease Mother   . Heart disease Brother   . Lung cancer Brother   . Emphysema Father   . Lung cancer Brother        x 2  . Lupus Daughter   . Colon cancer Neg Hx   . Esophageal cancer Neg Hx   . Rectal cancer Neg Hx   . Stomach cancer Neg Hx   .  Prostate cancer Neg Hx        Objective: There were no vitals filed for this visit.   Physical Exam  Lab Results:  PSA No results found for: PSA No results found for: TESTOSTERONE    Studies/Results: No results found.    Assessment & Plan: No problem-specific Assessment & Plan notes found for this encounter.    No orders of the defined types were placed in this encounter.    No orders of the defined types were placed in this encounter.     No follow-ups on file.   CC: [facility], [place], MD      Irine Seal 01/31/2020

## 2020-02-01 ENCOUNTER — Other Ambulatory Visit: Payer: Self-pay

## 2020-02-01 ENCOUNTER — Ambulatory Visit: Payer: Medicare Other

## 2020-02-01 ENCOUNTER — Ambulatory Visit (INDEPENDENT_AMBULATORY_CARE_PROVIDER_SITE_OTHER): Payer: Medicare Other | Admitting: Urology

## 2020-02-01 VITALS — BP 148/67 | HR 82 | Temp 98.7°F | Ht 72.0 in | Wt 179.0 lb

## 2020-02-01 DIAGNOSIS — C671 Malignant neoplasm of dome of bladder: Secondary | ICD-10-CM

## 2020-02-01 LAB — URINALYSIS, ROUTINE W REFLEX MICROSCOPIC
Bilirubin, UA: NEGATIVE
Glucose, UA: NEGATIVE
Ketones, UA: NEGATIVE
Leukocytes,UA: NEGATIVE
Nitrite, UA: NEGATIVE
Specific Gravity, UA: 1.02 (ref 1.005–1.030)
Urobilinogen, Ur: 0.2 mg/dL (ref 0.2–1.0)
pH, UA: 5.5 (ref 5.0–7.5)

## 2020-02-01 LAB — MICROSCOPIC EXAMINATION
Bacteria, UA: NONE SEEN
RBC, Urine: >30 /hpf — AB (ref 0–2)
Renal Epithel, UA: NONE SEEN /hpf

## 2020-02-01 NOTE — Progress Notes (Signed)
Cardiology Office Note   Date:  02/04/2020   ID:  [name], DOB 1945-09-11, MRN [id number]  PCP:  Wannetta Sender, FNP  Cardiologist:  [name]     Chief Complaint  Patient presents with  . Hospitalization Follow-up      History of Present Illness: Richard [name] is a 74 y.o. male who presents for post hospital visit.  Hx of CAD s/p PCI to RCA 2007 , HLD and atrial flutter with hx of ablation. and carotid disease.   Last visit with [name] 12/24/19 he describes occasional angina symptoms, feeling of burning in his upper chest and to his mandible.  Not always with exertion however.  He also reports trouble with reflux and a hiatal hernia. Amlodipine was added and echo ordered.  See below   He is following in the oncology clinic for management of bladder cancer, status post TURBT in August.  He has a high-grade muscle invasive tumor and is considering chemoradiation options with Dr. Delton Coombes, I reviewed the note from October 13.  He tells me that he has not made a decision, he may ultimately elect no treatment at all.  5-FU does have potential cardiac toxicity which would need to be considered, Echo previously normal as of 2019.  Echo with drop in EF, to 25-30% but improved in hospital to 30-35%.  Amlodipine stopped but now back at low dose , lopressor changed to torpol XL BID stopped avapro and entresto 49/51 added.   He was then hospitalized in Dec after presenting with chest pain.  Hs troponin pk of 39. He also had active hematuria.  He was in atrial fib. On admit EKG was in SR with PACs.  And he developed AKI  Echo on admit with EF 30-35%, G1DD on lopressor 50 mg BID unfortunately had to stop entresto due to AKI, no ACE/ARB/spironolactone   At discharge was in SR no further PAF -discharged on amlodipine 2.5 daily, imdur 30 and torprol XL 100 mg daily.  ASA on hold due to hematuria. PSVT with increase of BB.   12/4-Underwent cystoscopy, with transurethral  resection of bladder tumor large, clot evacuation bilateral retrograde pyelogram with right for ureteral stent removal-and on the left with Foley catheter off CBI  Wt at discharge 81.2 Kg  Today he presents with his wife, a retired Haematologist and caregiver.  Overall pt has improved no further GU bleeding.  He is receiving radiation for treatment daily at [facility].  The drive and radiation are tiring for them.  He has had no chest pain.  He has become more SOB and with lower ext edema.  At discharge the torsemide on med sheet but instructed not to take.  He now has lower ext edema and his wt is up from 81.2 Kg to 83.6 though he had coat on for weight.  The SOB is when in bed and he wakes up gasping for air.  He sleeps the rest of the night sitting in chair.    Is not eating much salt.   His wife reviewed hospitalization with me.    Past Medical History:  Diagnosis Date  . Anemia 12/2019  . Bladder cancer ([place])   . CAD (coronary artery disease)    a. 2007 - DES x 2 proximal to mid RCA b. 09/2017: DES to mid LAD and OM2. Patent stents along RCA.   [name] CHF (congestive heart failure) ([place])   . Essential hypertension   .  History of atrial flutter    Status post ablation 2008 - Dr. Caryl Comes  . History of transient ischemic attack (TIA)   . Mixed hyperlipidemia    Statin intolerance  . Pneumonia   . Type 2 diabetes mellitus (H[facility])    A1C 11.5 06/2017    Past Surgical History:  Procedure Laterality Date  . BALLOON ANGIOPLASTY, ARTERY    . BICEPS TENDON REPAIR Left   . CARDIAC ELECTROPHYSIOLOGY MAPPING AND ABLATION    . CARPAL TUNNEL RELEASE Bilateral   . cervical neck fusion     x 4  . CORONARY ANGIOPLASTY    . CORONARY STENT INTERVENTION N/A 10/17/2017   Procedure: CORONARY STENT INTERVENTION;  Surgeon: Martinique, Peter M, MD;  Location: [facility];  Service: Cardiovascular;  Laterality: N/A;  . CORONARY STENT PLACEMENT     x 2  . CYSTOSCOPY W/ RETROGRADES Bilateral 10/08/2019    Procedure: CYSTOSCOPY WITH BILATERAL RETROGRADE PYELOGRAM;  Surgeon: Cleon Gustin, MD;  Location: [facility];  Service: Urology;  Laterality: Bilateral;  . CYSTOSCOPY W/ URETERAL STENT PLACEMENT Bilateral 01/26/2020   Procedure: CYSTOSCOPY WITH RETROGRADE PYELOGRAM bilateral right ureter stent removal;  Surgeon: Irine Seal, MD;  Location: [facility];  Service: Urology;  Laterality: Bilateral;  . CYSTOSCOPY WITH FULGERATION N/A 01/26/2020   Procedure: CYSTOSCOPY WITH FULGERATION, CLOT EVACUATION transurethral resection bladder tumor;  Surgeon: Irine Seal, MD;  Location: [facility];  Service: Urology;  Laterality: N/A;  . CYSTOSCOPY WITH URETHRAL DILATATION  10/08/2019   Procedure: CYSTOSCOPY WITH URETHRAL DILATATION;  Surgeon: Cleon Gustin, MD;  Location: [facility];  Service: Urology;;  . LEFT HEART CATH AND CORONARY ANGIOGRAPHY N/A 10/17/2017   Procedure: LEFT HEART CATH AND CORONARY ANGIOGRAPHY;  Surgeon: Martinique, Peter M, MD;  Location: [facility];  Service: Cardiovascular;  Laterality: N/A;  . ROTATOR CUFF REPAIR Right    x 4  . TIBIA FRACTURE SURGERY Left   . TRANSURETHRAL RESECTION OF BLADDER TUMOR N/A 10/08/2019   Procedure: TRANSURETHRAL RESECTION OF BLADDER TUMOR (TURBT);  Surgeon: Cleon Gustin, MD;  Location: [facility];  Service: Urology;  Laterality: N/A;     Current Outpatient Medications  Medication Sig Dispense Refill  . ALPRAZolam (XANAX) 0.5 MG tablet Take 0.5 mg by mouth 2 (two) times daily as needed.    [name] amLODipine (NORVASC) 2.5 MG tablet Take 2.5 mg by mouth daily.    [name] atorvastatin (LIPITOR) 10 MG tablet Take 10 mg by mouth daily.    . B-D ULTRAFINE III SHORT PEN 31G X 8 MM MISC Inject into the skin.    [name] glipiZIDE (GLUCOTROL XL) 10 MG 24 hr tablet Take 10 mg by mouth 2 (two) times daily.    . isosorbide mononitrate (IMDUR) 30 MG 24 hr tablet Take 1 tablet (30 mg total) by mouth at bedtime. 30 tablet 6  . metFORMIN (GLUCOPHAGE) 1000 MG tablet Take 1,000 mg by mouth  2 (two) times daily with a meal.    . metoprolol succinate (TOPROL-XL) 100 MG 24 hr tablet Take 1 tablet (100 mg total) by mouth daily. Take with or immediately following a meal. 60 tablet 3  . nitroGLYCERIN (NITROSTAT) 0.4 MG SL tablet Place 1 tablet (0.4 mg total) under the tongue every 5 (five) minutes as needed for chest pain. 25 tablet 3  . oxyCODONE-acetaminophen (PERCOCET) 5-325 MG tablet Take 1 tablet by mouth every 4 (four) hours as needed for moderate pain or severe pain. 30 tablet 0  .  torsemide (DEMADEX) 20 MG tablet Take 20 mg by mouth daily as needed (swelling).      No current facility-administered medications for this visit.    Allergies:   Statins    Social History:  The patient  reports that he has been smoking cigarettes. He has a 54.00 pack-year smoking history. He has never used smokeless tobacco. He reports that he does not drink alcohol and does not use drugs.   Family History:  The patient's family history includes Diabetes in his mother; Emphysema in his father; Heart disease in his brother and mother; Lung cancer in his brother and brother; Lupus in his daughter.    ROS:  General:no colds or fevers, + weight increase Skin:no rashes or ulcers HEENT:no blurred vision, no congestion CV:see HPI PUL:see HPI GI:no diarrhea constipation or melena, no indigestion GU:no hematuria, no dysuria MS:no joint pain, no claudication, + edema Neuro:no syncope, no lightheadedness Endo:+ diabetes, no thyroid disease  Wt Readings from Last 3 Encounters:  02/04/20 182 lb 6.4 oz (82.7 kg)  02/01/20 179 lb 0.2 oz (81.2 kg)  01/26/20 179 lb 0.2 oz (81.2 kg)     PHYSICAL EXAM: VS:  BP 132/80   Pulse 86   Ht 6' (1.829 m)   Wt 182 lb 6.4 oz (82.7 kg)   SpO2 98%   BMI 24.74 kg/m  , BMI Body mass index is 24.74 kg/m. General:Pleasant affect, NAD Skin:Warm and dry, brisk capillary refill HEENT:normocephalic, sclera clear, mucus membranes moist Neck:supple, no JVD, no bruits   Heart:S1S2 RRR without murmur, gallup, rub or click Lungs:With rales in bases, no rhonchi, or wheezes QBH:ALPF, non tender, + BS, do not palpate liver spleen or masses Ext:2+ lower ext edema to knees, 2+ pedal pulses, 2+ radial pulses Neuro:alert and oriented X 3, MAE, follows commands, + facial symmetry    EKG:  EKG is NOT ordered today.    Recent Labs: 01/28/2020: ALT 16 02/04/2020: BUN 27; Creatinine, Ser 1.11; Hemoglobin 9.1; Magnesium 1.5; Platelets 273; Potassium 4.0; Sodium 136    Lipid Panel    Component Value Date/Time   CHOL 157 09/16/2019 0614   TRIG 121 09/16/2019 0614   HDL 39 (L) 09/16/2019 0614   CHOLHDL 4.0 09/16/2019 0614   VLDL 24 09/16/2019 0614   LDLCALC 94 09/16/2019 0614       Other studies Reviewed: Additional studies/ records that were reviewed today include: .  Echo 01/24/2020 1. Left ventricular ejection fraction, by estimation, is 30 to 35%. The  left ventricle has moderately decreased function. The left ventricle  demonstrates global hypokinesis. The left ventricular internal cavity size  was mildly dilated. Left ventricular  diastolic parameters are consistent with Grade I diastolic dysfunction  (impaired relaxation). Elevated left atrial pressure.  2. Right ventricular systolic function is normal. The right ventricular  size is normal.  3. The mitral valve is normal in structure. Mild mitral valve  regurgitation. No evidence of mitral stenosis.  4. The aortic valve is tricuspid. Aortic valve regurgitation is not  visualized. Mild aortic valve sclerosis is present, with no evidence of  aortic valve stenosis.  5. The inferior vena cava is normal in size with greater than 50%  respiratory variability, suggesting right atrial pressure of 3 mmHg.   ECHO 12/26/19 IMPRESSIONS    1. Left ventricular ejection fraction, by estimation, is 25 to 30% -  significantly decreased in comparison with the prior study in July 2021.  Significantly  reduced global longitudinal strain of -12.5%. The  left  ventricle has severely decreased function.  The left ventricle demonstrates global hypokinesis with mild regional  variation. There is mild left ventricular hypertrophy. Left ventricular  diastolic parameters are consistent with Grade II diastolic dysfunction  (pseudonormalization).  2. Right ventricular systolic function is normal. The right ventricular  size is normal. There is mildly elevated pulmonary artery systolic  pressure. The estimated right ventricular systolic pressure is 37.1 mmHg.  3. The mitral valve is grossly normal. Mild mitral valve regurgitation.  4. The aortic valve is tricuspid. Aortic valve regurgitation is trivial.  Mild to moderate aortic valve sclerosis/calcification is present, without  any evidence of aortic stenosis.  5. The inferior vena cava is normal in size with <50% respiratory  variability, suggesting right atrial pressure of 8 mmHg.   FINDINGS  Left Ventricle: Left ventricular ejection fraction, by estimation, is 25  to 30%. The left ventricle has severely decreased function. The left  ventricle demonstrates global hypokinesis. The left ventricular internal  cavity size was normal in size. There  is mild left ventricular hypertrophy. Left ventricular diastolic  parameters are consistent with Grade II diastolic dysfunction  (pseudonormalization).   Right Ventricle: The right ventricular size is normal. No increase in  right ventricular wall thickness. Right ventricular systolic function is  normal. There is mildly elevated pulmonary artery systolic pressure. The  tricuspid regurgitant velocity is 2.87  m/s, and with an assumed right atrial pressure of 8 mmHg, the estimated  right ventricular systolic pressure is 06.2 mmHg.   Left Atrium: Left atrial size was normal in size.   Right Atrium: Right atrial size was normal in size.   Pericardium: There is no evidence of pericardial  effusion.   Mitral Valve: The mitral valve is grossly normal. Mild mitral valve  regurgitation.   Tricuspid Valve: The tricuspid valve is grossly normal. Tricuspid valve  regurgitation is mild.   ASSESSMENT AND PLAN:  1.  CHF acute on chronic systolic HF/cardiomyopathy, check labs and add torsemide  2+ lower ext edema and SOB when flat - will add torsemide back once we have lab results. Will have him follow up in 2 weeks. review of hospital notes and labs. 2.  CAD  no angina troponin 29-39 not a candidate for cath, or antiplatelet therapy on imdur, BB amlodipine and statin  May need to resume entresto if Cr stable and pt tolerates diuretic.  If no further resume ASA 81 mg next visit.   3.  GU bleeding stopped. Now with radiation therapy 4.  Hx of PAF regular RR today, not on anticoagulation, ablation 2008 by Dr. Caryl Comes.   5.  AKI  Check today 6. Anemia due to urinary bleeding check CBC  7.  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  8.  DM-2 per PCP    Current medicines are reviewed with the patient today.  The patient Has no concerns regarding medicines.  The following changes have been made:  See above Labs/ tests ordered today include:see above  Disposition:   FU:  see above  Signed, Cecilie Kicks, NP  02/04/2020 5:17 PM    [facility] [address], [place], [place], [place] Phone: [phone]; Fax: [phone]

## 2020-02-01 NOTE — Progress Notes (Signed)
Urological Symptom Review  Patient is experiencing the following symptoms: Burning/pain with urination Get up at night to urinate Stream starts and stops Erection problems (male only)   Review of Systems  Gastrointestinal (upper)  : Negative for upper GI symptoms  Gastrointestinal (lower) : Constipation  Constitutional : Weight loss Fatigue  Skin: Negative for skin symptoms  Eyes: Blurred vision  Ear/Nose/Throat : Negative for Ear/Nose/Throat symptoms  Hematologic/Lymphatic: Easy bruising  Cardiovascular : Leg swelling  Respiratory : Cough Shortness of breath Endocrine: Negative for endocrine symptoms  Musculoskeletal: Negative for musculoskeletal symptoms  Neurological: Negative for neurological symptoms  Psychologic: Negative for psychiatric symptoms

## 2020-02-04 ENCOUNTER — Ambulatory Visit: Payer: Medicare Other | Admitting: Cardiology

## 2020-02-04 ENCOUNTER — Other Ambulatory Visit: Payer: Self-pay

## 2020-02-04 ENCOUNTER — Other Ambulatory Visit (HOSPITAL_COMMUNITY)
Admission: RE | Admit: 2020-02-04 | Discharge: 2020-02-04 | Disposition: A | Discharge status: Home / Self Care | Payer: Medicare Other | Source: Ambulatory Visit | Attending: Cardiology | Admitting: Cardiology

## 2020-02-04 ENCOUNTER — Ambulatory Visit
Admission: RE | Admit: 2020-02-04 | Discharge: 2020-02-04 | Disposition: A | Discharge status: Home / Self Care | Payer: Medicare Other | Source: Ambulatory Visit | Attending: Radiation Oncology | Admitting: Radiation Oncology

## 2020-02-04 ENCOUNTER — Encounter: Payer: Self-pay | Admitting: Cardiology

## 2020-02-04 VITALS — BP 132/80 | HR 86 | Ht 72.0 in | Wt 182.4 lb

## 2020-02-04 DIAGNOSIS — Z8679 Personal history of other diseases of the circulatory system: Secondary | ICD-10-CM | POA: Diagnosis not present

## 2020-02-04 DIAGNOSIS — Z510 Encounter for antineoplastic radiation therapy: Secondary | ICD-10-CM | POA: Diagnosis not present

## 2020-02-04 DIAGNOSIS — N179 Acute kidney failure, unspecified: Secondary | ICD-10-CM | POA: Insufficient documentation

## 2020-02-04 DIAGNOSIS — I2510 Atherosclerotic heart disease of native coronary artery without angina pectoris: Secondary | ICD-10-CM | POA: Diagnosis not present

## 2020-02-04 DIAGNOSIS — I509 Heart failure, unspecified: Secondary | ICD-10-CM

## 2020-02-04 DIAGNOSIS — D649 Anemia, unspecified: Secondary | ICD-10-CM

## 2020-02-04 DIAGNOSIS — C679 Malignant neoplasm of bladder, unspecified: Secondary | ICD-10-CM | POA: Diagnosis not present

## 2020-02-04 DIAGNOSIS — E782 Mixed hyperlipidemia: Secondary | ICD-10-CM

## 2020-02-04 DIAGNOSIS — C671 Malignant neoplasm of dome of bladder: Secondary | ICD-10-CM | POA: Diagnosis not present

## 2020-02-04 LAB — BASIC METABOLIC PANEL
Anion gap: 9 (ref 5–15)
BUN: 27 mg/dL — ABNORMAL HIGH (ref 8–23)
CO2: 26 mmol/L (ref 22–32)
Calcium: 8.7 mg/dL — ABNORMAL LOW (ref 8.9–10.3)
Chloride: 101 mmol/L (ref 98–111)
Creatinine, Ser: 1.11 mg/dL (ref 0.61–1.24)
GFR, Estimated: >60 mL/min (ref >60)
Glucose, Bld: 129 mg/dL — ABNORMAL HIGH (ref 70–99)
Potassium: 4 mmol/L (ref 3.5–5.1)
Sodium: 136 mmol/L (ref 135–145)

## 2020-02-04 LAB — CBC
HCT: 29.7 % — ABNORMAL LOW (ref 39.0–52.0)
Hemoglobin: 9.1 g/dL — ABNORMAL LOW (ref 13.0–17.0)
MCH: 27.7 pg (ref 26.0–34.0)
MCHC: 30.6 g/dL (ref 30.0–36.0)
MCV: 90.3 fL (ref 80.0–100.0)
Platelets: 273 10*3/uL (ref 150–400)
RBC: 3.29 MIL/uL — ABNORMAL LOW (ref 4.22–5.81)
RDW: 14.7 % (ref 11.5–15.5)
WBC: 7.7 10*3/uL (ref 4.0–10.5)
nRBC: 0 % (ref 0.0–0.2)

## 2020-02-04 LAB — MAGNESIUM: Magnesium: 1.5 mg/dL — ABNORMAL LOW (ref 1.7–2.4)

## 2020-02-04 NOTE — Patient Instructions (Signed)
Medication Instructions:  Your physician recommends that you continue on your current medications as directed. Please refer to the Current Medication list given to you today.  *If you need a refill on your cardiac medications before your next appointment, please call your pharmacy*   Lab Work: Your physician recommends that you return for lab work in: Today   If you have labs (blood work) drawn today and your tests are completely normal, you will receive your results only by:  MyChart Message (if you have MyChart) OR  A paper copy in the mail If you have any lab test that is abnormal or we need to change your treatment, we will call you to review the results.   Testing/Procedures: NONE    Follow-Up: At [facility], you and your health needs are our priority.  As part of our continuing mission to provide you with exceptional heart care, we have created designated Provider Care Teams.  These Care Teams include your primary Cardiologist (physician) and Advanced Practice Providers (APPs -  Physician Assistants and Nurse Practitioners) who all work together to provide you with the care you need, when you need it.  We recommend signing up for the patient portal called "MyChart".  Sign up information is provided on this After Visit Summary.  MyChart is used to connect with patients for Virtual Visits (Telemedicine).  Patients are able to view lab/test results, encounter notes, upcoming appointments, etc.  Non-urgent messages can be sent to your provider as well.   To learn more about what you can do with MyChart, go to [email or web address].    Your next appointment:   2 week(s)  The format for your next appointment:   In Person  Provider:   You will see one of the following Advanced Practice Providers on your designated Care Team:    Bernerd Pho, PA-C   Ermalinda Barrios, PA-C       Other Instructions Thank you for choosing [facility]!

## 2020-02-05 ENCOUNTER — Telehealth: Payer: Self-pay

## 2020-02-05 ENCOUNTER — Ambulatory Visit
Admission: RE | Admit: 2020-02-05 | Discharge: 2020-02-05 | Disposition: A | Discharge status: Home / Self Care | Payer: Medicare Other | Source: Ambulatory Visit | Attending: Radiation Oncology | Admitting: Radiation Oncology

## 2020-02-05 DIAGNOSIS — Z79899 Other long term (current) drug therapy: Secondary | ICD-10-CM

## 2020-02-05 DIAGNOSIS — Z510 Encounter for antineoplastic radiation therapy: Secondary | ICD-10-CM | POA: Diagnosis not present

## 2020-02-05 DIAGNOSIS — C679 Malignant neoplasm of bladder, unspecified: Secondary | ICD-10-CM | POA: Diagnosis not present

## 2020-02-05 DIAGNOSIS — C671 Malignant neoplasm of dome of bladder: Secondary | ICD-10-CM | POA: Diagnosis not present

## 2020-02-05 NOTE — Telephone Encounter (Signed)
I spoke with wife. Richard [name] will take torsemide 20 mg every other day for 3 days and then go back to PRN use. He will also take Magnesium oxide 400 mg twice a day for 3 days and then stop.  He will repeat BMET and magnesium on Monday 12/20   Lab results copied to pcp

## 2020-02-05 NOTE — Telephone Encounter (Signed)
Error - duplicate note.

## 2020-02-05 NOTE — Telephone Encounter (Signed)
-----   Message from Isaiah Serge, NP sent at 02/05/2020  8:22 AM EST ----- Let pt and his wife know his kidney function is almost normal so take torsemide every other day for 3 times.   Also magnesium is low so magnesium oxide 400 mg BID for 3 days then stop.  Recheck BMP and magnesium on Monday   His hgb is 9.1 so stable.

## 2020-02-06 ENCOUNTER — Ambulatory Visit
Admission: RE | Admit: 2020-02-06 | Discharge: 2020-02-06 | Disposition: A | Discharge status: Home / Self Care | Payer: Medicare Other | Source: Ambulatory Visit | Attending: Radiation Oncology | Admitting: Radiation Oncology

## 2020-02-06 ENCOUNTER — Other Ambulatory Visit: Payer: Self-pay

## 2020-02-06 DIAGNOSIS — C671 Malignant neoplasm of dome of bladder: Secondary | ICD-10-CM | POA: Diagnosis not present

## 2020-02-06 DIAGNOSIS — C679 Malignant neoplasm of bladder, unspecified: Secondary | ICD-10-CM | POA: Diagnosis not present

## 2020-02-06 DIAGNOSIS — Z510 Encounter for antineoplastic radiation therapy: Secondary | ICD-10-CM | POA: Diagnosis not present

## 2020-02-07 ENCOUNTER — Ambulatory Visit
Admission: RE | Admit: 2020-02-07 | Discharge: 2020-02-07 | Disposition: A | Discharge status: Home / Self Care | Payer: Medicare Other | Source: Ambulatory Visit | Attending: Radiation Oncology | Admitting: Radiation Oncology

## 2020-02-07 DIAGNOSIS — C671 Malignant neoplasm of dome of bladder: Secondary | ICD-10-CM | POA: Diagnosis not present

## 2020-02-07 DIAGNOSIS — Z510 Encounter for antineoplastic radiation therapy: Secondary | ICD-10-CM | POA: Diagnosis not present

## 2020-02-07 DIAGNOSIS — C679 Malignant neoplasm of bladder, unspecified: Secondary | ICD-10-CM | POA: Diagnosis not present

## 2020-02-08 ENCOUNTER — Ambulatory Visit
Admission: RE | Admit: 2020-02-08 | Discharge: 2020-02-08 | Disposition: A | Discharge status: Home / Self Care | Payer: Medicare Other | Source: Ambulatory Visit | Attending: Radiation Oncology | Admitting: Radiation Oncology

## 2020-02-08 DIAGNOSIS — C671 Malignant neoplasm of dome of bladder: Secondary | ICD-10-CM | POA: Diagnosis not present

## 2020-02-08 DIAGNOSIS — Z510 Encounter for antineoplastic radiation therapy: Secondary | ICD-10-CM | POA: Diagnosis not present

## 2020-02-08 DIAGNOSIS — C679 Malignant neoplasm of bladder, unspecified: Secondary | ICD-10-CM | POA: Diagnosis not present

## 2020-02-11 ENCOUNTER — Other Ambulatory Visit (HOSPITAL_COMMUNITY)
Admission: RE | Admit: 2020-02-11 | Discharge: 2020-02-11 | Disposition: A | Discharge status: Home / Self Care | Payer: Medicare Other | Source: Ambulatory Visit | Attending: Cardiology | Admitting: Cardiology

## 2020-02-11 ENCOUNTER — Other Ambulatory Visit: Payer: Self-pay

## 2020-02-11 ENCOUNTER — Telehealth: Payer: Self-pay | Admitting: *Deleted

## 2020-02-11 ENCOUNTER — Ambulatory Visit
Admission: RE | Admit: 2020-02-11 | Discharge: 2020-02-11 | Disposition: A | Discharge status: Home / Self Care | Payer: Medicare Other | Source: Ambulatory Visit | Attending: Radiation Oncology | Admitting: Radiation Oncology

## 2020-02-11 DIAGNOSIS — H5203 Hypermetropia, bilateral: Secondary | ICD-10-CM | POA: Diagnosis not present

## 2020-02-11 DIAGNOSIS — Z79899 Other long term (current) drug therapy: Secondary | ICD-10-CM | POA: Diagnosis not present

## 2020-02-11 DIAGNOSIS — Z510 Encounter for antineoplastic radiation therapy: Secondary | ICD-10-CM | POA: Diagnosis not present

## 2020-02-11 DIAGNOSIS — C679 Malignant neoplasm of bladder, unspecified: Secondary | ICD-10-CM | POA: Diagnosis not present

## 2020-02-11 DIAGNOSIS — C671 Malignant neoplasm of dome of bladder: Secondary | ICD-10-CM | POA: Diagnosis not present

## 2020-02-11 LAB — BASIC METABOLIC PANEL
Anion gap: 9 (ref 5–15)
BUN: 31 mg/dL — ABNORMAL HIGH (ref 8–23)
CO2: 29 mmol/L (ref 22–32)
Calcium: 8.7 mg/dL — ABNORMAL LOW (ref 8.9–10.3)
Chloride: 100 mmol/L (ref 98–111)
Creatinine, Ser: 1.26 mg/dL — ABNORMAL HIGH (ref 0.61–1.24)
GFR, Estimated: 60 mL/min — ABNORMAL LOW (ref >60)
Glucose, Bld: 137 mg/dL — ABNORMAL HIGH (ref 70–99)
Potassium: 4.3 mmol/L (ref 3.5–5.1)
Sodium: 138 mmol/L (ref 135–145)

## 2020-02-11 NOTE — Telephone Encounter (Signed)
-----   Message from Satira Sark, MD sent at 02/11/2020 10:57 AM EST ----- Results reviewed.  Patient seen recently by Ms. Ingold NP.  Note reviewed.  Creatinine 1.26 up from 1.11, potassium normal.  Medications were recently adjusted.  Keep pending follow-up.

## 2020-02-12 ENCOUNTER — Ambulatory Visit: Payer: Medicare Other

## 2020-02-12 NOTE — Telephone Encounter (Signed)
Patient informed. Copy sent to PCP Unable to add Mg level-must be added on within 8 hours.

## 2020-02-13 ENCOUNTER — Ambulatory Visit: Payer: Medicare Other

## 2020-02-14 ENCOUNTER — Ambulatory Visit: Payer: Medicare Other

## 2020-02-18 ENCOUNTER — Ambulatory Visit
Admission: RE | Admit: 2020-02-18 | Discharge: 2020-02-18 | Disposition: A | Discharge status: Home / Self Care | Payer: Medicare Other | Source: Ambulatory Visit | Attending: Radiation Oncology | Admitting: Radiation Oncology

## 2020-02-18 DIAGNOSIS — C671 Malignant neoplasm of dome of bladder: Secondary | ICD-10-CM | POA: Diagnosis not present

## 2020-02-18 DIAGNOSIS — C679 Malignant neoplasm of bladder, unspecified: Secondary | ICD-10-CM | POA: Diagnosis not present

## 2020-02-18 DIAGNOSIS — Z510 Encounter for antineoplastic radiation therapy: Secondary | ICD-10-CM | POA: Diagnosis not present

## 2020-02-19 ENCOUNTER — Ambulatory Visit
Admission: RE | Admit: 2020-02-19 | Discharge: 2020-02-19 | Disposition: A | Discharge status: Home / Self Care | Payer: Medicare Other | Source: Ambulatory Visit | Attending: Radiation Oncology | Admitting: Radiation Oncology

## 2020-02-19 ENCOUNTER — Other Ambulatory Visit: Payer: Self-pay

## 2020-02-19 ENCOUNTER — Ambulatory Visit: Payer: Medicare Other | Admitting: Urology

## 2020-02-19 DIAGNOSIS — Z510 Encounter for antineoplastic radiation therapy: Secondary | ICD-10-CM | POA: Diagnosis not present

## 2020-02-19 DIAGNOSIS — C671 Malignant neoplasm of dome of bladder: Secondary | ICD-10-CM | POA: Diagnosis not present

## 2020-02-19 DIAGNOSIS — C679 Malignant neoplasm of bladder, unspecified: Secondary | ICD-10-CM | POA: Diagnosis not present

## 2020-02-20 ENCOUNTER — Ambulatory Visit: Payer: Medicare Other

## 2020-02-21 ENCOUNTER — Other Ambulatory Visit: Payer: Self-pay

## 2020-02-21 ENCOUNTER — Other Ambulatory Visit: Payer: Self-pay | Admitting: Radiation Oncology

## 2020-02-21 ENCOUNTER — Ambulatory Visit
Admission: RE | Admit: 2020-02-21 | Discharge: 2020-02-21 | Disposition: A | Discharge status: Home / Self Care | Payer: Medicare Other | Source: Ambulatory Visit | Attending: Radiation Oncology | Admitting: Radiation Oncology

## 2020-02-21 DIAGNOSIS — C671 Malignant neoplasm of dome of bladder: Secondary | ICD-10-CM | POA: Diagnosis not present

## 2020-02-21 DIAGNOSIS — Z510 Encounter for antineoplastic radiation therapy: Secondary | ICD-10-CM | POA: Diagnosis not present

## 2020-02-21 DIAGNOSIS — C679 Malignant neoplasm of bladder, unspecified: Secondary | ICD-10-CM | POA: Diagnosis not present

## 2020-02-24 ENCOUNTER — Telehealth: Payer: Self-pay | Admitting: Urology

## 2020-02-24 NOTE — Telephone Encounter (Signed)
Patients wife called and LM stating her husband needs refill on his pain medication.

## 2020-02-25 ENCOUNTER — Other Ambulatory Visit: Payer: Self-pay | Admitting: Urology

## 2020-02-25 ENCOUNTER — Ambulatory Visit: Payer: Medicare Other

## 2020-02-25 ENCOUNTER — Ambulatory Visit (HOSPITAL_COMMUNITY): Payer: Medicare Other | Admitting: Hematology

## 2020-02-25 MED ORDER — OXYCODONE-ACETAMINOPHEN 5-325 MG PO TABS: 1.0000 | ORAL_TABLET | ORAL | 0 refills | Status: DC | PRN

## 2020-02-25 NOTE — Telephone Encounter (Signed)
done

## 2020-02-26 ENCOUNTER — Ambulatory Visit: Payer: Medicare Other

## 2020-02-26 ENCOUNTER — Ambulatory Visit: Admission: RE | Admit: 2020-02-26 | Payer: Medicare Other | Source: Ambulatory Visit

## 2020-02-26 NOTE — Progress Notes (Signed)
Cardiology Office Note  Date: 02/27/2020   ID: Richard [name], DOB 29-Sep-1945, MRN [id number]  PCP:  Jason Coop, FNP  Cardiologist:  Nona Dell, MD Electrophysiologist:  None   Chief Complaint  Patient presents with  . Cardiac follow-up    History of Present Illness: Richard [name] is a 75 y.o. male last seen in December 2021 by Ms. Ingold NP, I reviewed the note.  He is here today with his wife for a follow-up visit.  He continues to undergo regular radiation treatments in [place] at the cancer center.  He states that he is weak, not sleeping well, describes orthopnea, also worsening leg swelling.  His weight is up potentially as much as 10 pounds since earlier in December.  He has been taking Demadex 20 mg daily.  He is currently not on ARB/ANRI in light of acute renal insufficiency during hospitalization in December.  His last creatinine had improved to 1.26 as of December 20.  Echocardiogram from December reported LVEF 30 to 35% range.  He does not describe any frank hematuria at this time.  Last hemoglobin was 9.1.  I reviewed his medications today.  We discussed getting follow-up lab work, he will at least need an increase in his Demadex most likely, we might be able to add back low-dose Entresto.  Past Medical History:  Diagnosis Date  . Anemia 12/2019  . Bladder cancer (HCC)   . CAD (coronary artery disease)    a. 2007 - DES x 2 proximal to mid RCA b. 09/2017: DES to mid LAD and OM2. Patent stents along RCA.   [name] CHF (congestive heart failure) (HCC)   . Essential hypertension   . History of atrial flutter    Status post ablation 2008 - Dr. Graciela Husbands  . History of transient ischemic attack (TIA)   . Mixed hyperlipidemia    Statin intolerance  . Pneumonia   . Type 2 diabetes mellitus (HCC)    A1C 11.5 06/2017    Past Surgical History:  Procedure Laterality Date  . BALLOON ANGIOPLASTY, ARTERY    . BICEPS TENDON REPAIR Left   . CARDIAC  ELECTROPHYSIOLOGY MAPPING AND ABLATION    . CARPAL TUNNEL RELEASE Bilateral   . cervical neck fusion     x 4  . CORONARY ANGIOPLASTY    . CORONARY STENT INTERVENTION N/A 10/17/2017   Procedure: CORONARY STENT INTERVENTION;  Surgeon: Swaziland, Peter M, MD;  Location: [facility] INVASIVE CV LAB;  Service: Cardiovascular;  Laterality: N/A;  . CORONARY STENT PLACEMENT     x 2  . CYSTOSCOPY W/ RETROGRADES Bilateral 10/08/2019   Procedure: CYSTOSCOPY WITH BILATERAL RETROGRADE PYELOGRAM;  Surgeon: Malen Gauze, MD;  Location: [facility]S;  Service: Urology;  Laterality: Bilateral;  . CYSTOSCOPY W/ URETERAL STENT PLACEMENT Bilateral 01/26/2020   Procedure: CYSTOSCOPY WITH RETROGRADE PYELOGRAM bilateral right ureter stent removal;  Surgeon: Bjorn Pippin, MD;  Location: [facility]S;  Service: Urology;  Laterality: Bilateral;  . CYSTOSCOPY WITH FULGERATION N/A 01/26/2020   Procedure: CYSTOSCOPY WITH FULGERATION, CLOT EVACUATION transurethral resection bladder tumor;  Surgeon: Bjorn Pippin, MD;  Location: [facility];  Service: Urology;  Laterality: N/A;  . CYSTOSCOPY WITH URETHRAL DILATATION  10/08/2019   Procedure: CYSTOSCOPY WITH URETHRAL DILATATION;  Surgeon: Malen Gauze, MD;  Location: [facility];  Service: Urology;;  . LEFT HEART CATH AND CORONARY ANGIOGRAPHY N/A 10/17/2017   Procedure: LEFT HEART CATH AND CORONARY ANGIOGRAPHY;  Surgeon: Swaziland, Peter M, MD;  Location: [facility]  INVASIVE CV LAB;  Service: Cardiovascular;  Laterality: N/A;  . ROTATOR CUFF REPAIR Right    x 4  . TIBIA FRACTURE SURGERY Left   . TRANSURETHRAL RESECTION OF BLADDER TUMOR N/A 10/08/2019   Procedure: TRANSURETHRAL RESECTION OF BLADDER TUMOR (TURBT);  Surgeon: Cleon Gustin, MD;  Location: [facility];  Service: Urology;  Laterality: N/A;    Current Outpatient Medications  Medication Sig Dispense Refill  . ALPRAZolam (XANAX) 0.5 MG tablet Take 0.5 mg by mouth 2 (two) times daily as needed.    [name] amLODipine (NORVASC) 2.5 MG tablet Take 2.5 mg by  mouth daily.    [name] atorvastatin (LIPITOR) 10 MG tablet Take 10 mg by mouth daily.    . B-D ULTRAFINE III SHORT PEN 31G X 8 MM MISC Inject into the skin.    [name] glipiZIDE (GLUCOTROL XL) 10 MG 24 hr tablet Take 10 mg by mouth 2 (two) times daily.    . isosorbide mononitrate (IMDUR) 30 MG 24 hr tablet Take 1 tablet (30 mg total) by mouth at bedtime. 30 tablet 6  . metFORMIN (GLUCOPHAGE) 1000 MG tablet Take 1,000 mg by mouth 2 (two) times daily with a meal.    . metoprolol succinate (TOPROL-XL) 100 MG 24 hr tablet Take 1 tablet (100 mg total) by mouth daily. Take with or immediately following a meal. 60 tablet 3  . nitroGLYCERIN (NITROSTAT) 0.4 MG SL tablet Place 1 tablet (0.4 mg total) under the tongue every 5 (five) minutes as needed for chest pain. 25 tablet 3  . oxyCODONE-acetaminophen (PERCOCET) 5-325 MG tablet Take 1 tablet by mouth every 4 (four) hours as needed for moderate pain or severe pain. 30 tablet 0  . torsemide (DEMADEX) 20 MG tablet Take 20 mg by mouth daily as needed (swelling).      No current facility-administered medications for this visit.   Allergies:  Statins   ROS: No recurrent angina at this time.  No syncope.  Physical Exam: VS:  BP (!) 146/80   Pulse 92   Ht 6' (1.829 m)   Wt 180 lb (81.6 kg)   SpO2 97%   BMI 24.41 kg/m , BMI Body mass index is 24.41 kg/m.  Wt Readings from Last 3 Encounters:  02/27/20 180 lb (81.6 kg)  02/04/20 182 lb 6.4 oz (82.7 kg)  02/01/20 179 lb 0.2 oz (81.2 kg)    General: Chronically ill-appearing male, no acute distress. HEENT: Conjunctiva and lids normal, wearing a mask. Neck: Supple, no elevated JVP or carotid bruits, no thyromegaly. Lungs: Decreased breath sounds at the bases, nonlabored breathing at rest. Cardiac: Regular rate and rhythm with ectopy, no S3, soft systolic murmur, no pericardial rub. Abdomen: Protuberant, bowel sounds present. Extremities: 2-3+ bilateral lower extremity edema.  ECG:  An ECG dated 01/23/2020 was  personally reviewed today and demonstrated:  Sinus rhythm with multiple PACs and PVC.  Recent Labwork: 01/28/2020: ALT 16; AST 22 02/04/2020: Hemoglobin 9.1; Magnesium 1.5; Platelets 273 02/11/2020: BUN 31; Creatinine, Ser 1.26; Potassium 4.3; Sodium 138     Component Value Date/Time   CHOL 157 09/16/2019 0614   TRIG 121 09/16/2019 0614   HDL 39 (L) 09/16/2019 0614   CHOLHDL 4.0 09/16/2019 0614   VLDL 24 09/16/2019 0614   LDLCALC 94 09/16/2019 0614    Other Studies Reviewed Today:  Echocardiogram 01/24/2020: 1. Left ventricular ejection fraction, by estimation, is 30 to 35%. The  left ventricle has moderately decreased function. The left ventricle  demonstrates global hypokinesis. The  left ventricular internal cavity size  was mildly dilated. Left ventricular  diastolic parameters are consistent with Grade I diastolic dysfunction  (impaired relaxation). Elevated left atrial pressure.  2. Right ventricular systolic function is normal. The right ventricular  size is normal.  3. The mitral valve is normal in structure. Mild mitral valve  regurgitation. No evidence of mitral stenosis.  4. The aortic valve is tricuspid. Aortic valve regurgitation is not  visualized. Mild aortic valve sclerosis is present, with no evidence of  aortic valve stenosis.  5. The inferior vena cava is normal in size with greater than 50%  respiratory variability, suggesting right atrial pressure of 3 mmHg.   Assessment and Plan:  1.  Acute on chronic systolic heart failure, LVEF 30 to 35% by most recent assessment and normal RV contraction.  He has evidence of fluid gain since December as noted above.  Check BMET with plan to increase Demadex, we may be able to add back Entresto at low dose potentially.  2.  Bladder cancer with hematuria now status post transurethral bladder tumor resection with clot evacuation and currently undergoing radiation therapy.  He has follow-up with oncology later this month.   Recheck hemoglobin.  3.  CAD with history of unstable angina complicated by problem #2.  He is currently off all antiplatelet therapy and is a poor candidate for PCI given inability to safely utilize long-term DAPT.  No progressive angina at this time.  He is on Norvasc, Imdur, Lipitor and Toprol-XL.  4.  History of acute renal insufficiency as described above.  Recheck BMET.  Medication Adjustments/Labs and Tests Ordered: Current medicines are reviewed at length with the patient today.  Concerns regarding medicines are outlined above.   Tests Ordered: Orders Placed This Encounter  Procedures  . CBC  . Basic Metabolic Panel (BMET)    Medication Changes: No orders of the defined types were placed in this encounter.   Disposition:  Follow up test results and determine next step.  Signed, Jonelle Sidle, MD, [facility] 02/27/2020 2:22 PM    [facility] at [facility] [address], [place], [place] [address] Phone: [phone]; Fax: [phone]

## 2020-02-27 ENCOUNTER — Ambulatory Visit
Admission: RE | Admit: 2020-02-27 | Discharge: 2020-02-27 | Disposition: A | Discharge status: Home / Self Care | Payer: Medicare Other | Source: Ambulatory Visit | Attending: Radiation Oncology | Admitting: Radiation Oncology

## 2020-02-27 ENCOUNTER — Other Ambulatory Visit (HOSPITAL_COMMUNITY)
Admission: RE | Admit: 2020-02-27 | Discharge: 2020-02-27 | Disposition: A | Discharge status: Home / Self Care | Payer: Medicare Other | Source: Ambulatory Visit | Attending: Cardiology | Admitting: Cardiology

## 2020-02-27 ENCOUNTER — Ambulatory Visit: Payer: Medicare Other

## 2020-02-27 ENCOUNTER — Encounter: Payer: Self-pay | Admitting: Cardiology

## 2020-02-27 ENCOUNTER — Other Ambulatory Visit: Payer: Self-pay

## 2020-02-27 ENCOUNTER — Telehealth: Payer: Self-pay

## 2020-02-27 ENCOUNTER — Ambulatory Visit: Payer: Medicare Other | Admitting: Cardiology

## 2020-02-27 VITALS — BP 146/80 | HR 92 | Ht 72.0 in | Wt 180.0 lb

## 2020-02-27 DIAGNOSIS — I5022 Chronic systolic (congestive) heart failure: Secondary | ICD-10-CM | POA: Diagnosis not present

## 2020-02-27 DIAGNOSIS — Z510 Encounter for antineoplastic radiation therapy: Secondary | ICD-10-CM | POA: Insufficient documentation

## 2020-02-27 DIAGNOSIS — I25119 Atherosclerotic heart disease of native coronary artery with unspecified angina pectoris: Secondary | ICD-10-CM

## 2020-02-27 DIAGNOSIS — Z79899 Other long term (current) drug therapy: Secondary | ICD-10-CM

## 2020-02-27 DIAGNOSIS — C679 Malignant neoplasm of bladder, unspecified: Secondary | ICD-10-CM | POA: Diagnosis not present

## 2020-02-27 DIAGNOSIS — C671 Malignant neoplasm of dome of bladder: Secondary | ICD-10-CM | POA: Diagnosis not present

## 2020-02-27 LAB — BASIC METABOLIC PANEL
Anion gap: 10 (ref 5–15)
BUN: 32 mg/dL — ABNORMAL HIGH (ref 8–23)
CO2: 30 mmol/L (ref 22–32)
Calcium: 8.8 mg/dL — ABNORMAL LOW (ref 8.9–10.3)
Chloride: 97 mmol/L — ABNORMAL LOW (ref 98–111)
Creatinine, Ser: 1.4 mg/dL — ABNORMAL HIGH (ref 0.61–1.24)
GFR, Estimated: 53 mL/min — ABNORMAL LOW (ref >60)
Glucose, Bld: 238 mg/dL — ABNORMAL HIGH (ref 70–99)
Potassium: 4.1 mmol/L (ref 3.5–5.1)
Sodium: 137 mmol/L (ref 135–145)

## 2020-02-27 LAB — CBC
HCT: 30.5 % — ABNORMAL LOW (ref 39.0–52.0)
Hemoglobin: 9.5 g/dL — ABNORMAL LOW (ref 13.0–17.0)
MCH: 26.8 pg (ref 26.0–34.0)
MCHC: 31.1 g/dL (ref 30.0–36.0)
MCV: 85.9 fL (ref 80.0–100.0)
Platelets: 256 10*3/uL (ref 150–400)
RBC: 3.55 MIL/uL — ABNORMAL LOW (ref 4.22–5.81)
RDW: 15.2 % (ref 11.5–15.5)
WBC: 6 10*3/uL (ref 4.0–10.5)
nRBC: 0 % (ref 0.0–0.2)

## 2020-02-27 MED ORDER — TORSEMIDE 20 MG PO TABS: 40.0000 mg | ORAL_TABLET | Freq: Every day | ORAL | 3 refills | Status: DC

## 2020-02-27 NOTE — Telephone Encounter (Signed)
-----   Message from Jonelle Sidle, MD sent at 02/27/2020  3:48 PM EST ----- Results reviewed. Creatinine 1.40 from 1.26, potassium normal at 4.1. Hemoglobin up to 9.5 from 9.1. Would increase Demadex to 40 mg daily and schedule follow-up visit in 2 weeks with BMET at that time.

## 2020-02-27 NOTE — Patient Instructions (Signed)
Medication Instructions:  We will contact you regarding medication changes *If you need a refill on your cardiac medications before your next appointment, please call your pharmacy*   Lab Work: CBC/BMET If you have labs (blood work) drawn today and your tests are completely normal, you will receive your results only by: [name] MyChart Message (if you have MyChart) OR . A paper copy in the mail If you have any lab test that is abnormal or we need to change your treatment, we will call you to review the results.   Testing/Procedures: None Today   Follow-Up: At [facility], you and your health needs are our priority.  As part of our continuing mission to provide you with exceptional heart care, we have created designated Provider Care Teams.  These Care Teams include your primary Cardiologist (physician) and Advanced Practice Providers (APPs -  Physician Assistants and Nurse Practitioners) who all work together to provide you with the care you need, when you need it.  We recommend signing up for the patient portal called "MyChart".  Sign up information is provided on this After Visit Summary.  MyChart is used to connect with patients for Virtual Visits (Telemedicine).  Patients are able to view lab/test results, encounter notes, upcoming appointments, etc.  Non-urgent messages can be sent to your provider as well.   To learn more about what you can do with MyChart, go to [email or web address].    Your next appointment:   To be determined after lab results.      Other Instructions None Today

## 2020-02-27 NOTE — Telephone Encounter (Signed)
I spoke with wife, they will increase demadex to 40 mg daily and fu with M.lenze, PA-C on 1/26

## 2020-02-28 ENCOUNTER — Ambulatory Visit: Payer: Medicare Other

## 2020-02-28 ENCOUNTER — Other Ambulatory Visit: Payer: Self-pay

## 2020-02-28 ENCOUNTER — Ambulatory Visit
Admission: RE | Admit: 2020-02-28 | Discharge: 2020-02-28 | Disposition: A | Discharge status: Home / Self Care | Payer: Medicare Other | Source: Ambulatory Visit | Attending: Radiation Oncology | Admitting: Radiation Oncology

## 2020-02-28 DIAGNOSIS — C679 Malignant neoplasm of bladder, unspecified: Secondary | ICD-10-CM | POA: Diagnosis not present

## 2020-02-28 DIAGNOSIS — Z510 Encounter for antineoplastic radiation therapy: Secondary | ICD-10-CM | POA: Diagnosis not present

## 2020-02-28 DIAGNOSIS — C671 Malignant neoplasm of dome of bladder: Secondary | ICD-10-CM | POA: Diagnosis not present

## 2020-02-29 ENCOUNTER — Ambulatory Visit
Admission: RE | Admit: 2020-02-29 | Discharge: 2020-02-29 | Disposition: A | Discharge status: Home / Self Care | Payer: Medicare Other | Source: Ambulatory Visit | Attending: Radiation Oncology | Admitting: Radiation Oncology

## 2020-02-29 ENCOUNTER — Ambulatory Visit: Payer: Medicare Other

## 2020-02-29 ENCOUNTER — Other Ambulatory Visit: Payer: Self-pay

## 2020-02-29 DIAGNOSIS — C679 Malignant neoplasm of bladder, unspecified: Secondary | ICD-10-CM | POA: Diagnosis not present

## 2020-02-29 DIAGNOSIS — C671 Malignant neoplasm of dome of bladder: Secondary | ICD-10-CM | POA: Diagnosis not present

## 2020-02-29 DIAGNOSIS — Z510 Encounter for antineoplastic radiation therapy: Secondary | ICD-10-CM | POA: Diagnosis not present

## 2020-03-03 ENCOUNTER — Ambulatory Visit
Admission: RE | Admit: 2020-03-03 | Discharge: 2020-03-03 | Disposition: A | Discharge status: Home / Self Care | Payer: Medicare Other | Source: Ambulatory Visit | Attending: Radiation Oncology | Admitting: Radiation Oncology

## 2020-03-03 ENCOUNTER — Ambulatory Visit: Payer: Medicare Other

## 2020-03-03 ENCOUNTER — Other Ambulatory Visit: Payer: Self-pay

## 2020-03-03 DIAGNOSIS — C671 Malignant neoplasm of dome of bladder: Secondary | ICD-10-CM | POA: Diagnosis not present

## 2020-03-03 DIAGNOSIS — Z510 Encounter for antineoplastic radiation therapy: Secondary | ICD-10-CM | POA: Diagnosis not present

## 2020-03-03 DIAGNOSIS — C679 Malignant neoplasm of bladder, unspecified: Secondary | ICD-10-CM | POA: Diagnosis not present

## 2020-03-04 ENCOUNTER — Ambulatory Visit
Admission: RE | Admit: 2020-03-04 | Discharge: 2020-03-04 | Disposition: A | Discharge status: Home / Self Care | Payer: Medicare Other | Source: Ambulatory Visit | Attending: Radiation Oncology | Admitting: Radiation Oncology

## 2020-03-04 DIAGNOSIS — C679 Malignant neoplasm of bladder, unspecified: Secondary | ICD-10-CM | POA: Diagnosis not present

## 2020-03-04 DIAGNOSIS — Z510 Encounter for antineoplastic radiation therapy: Secondary | ICD-10-CM | POA: Diagnosis not present

## 2020-03-04 DIAGNOSIS — C671 Malignant neoplasm of dome of bladder: Secondary | ICD-10-CM | POA: Diagnosis not present

## 2020-03-05 ENCOUNTER — Ambulatory Visit
Admission: RE | Admit: 2020-03-05 | Discharge: 2020-03-05 | Disposition: A | Discharge status: Home / Self Care | Payer: Medicare Other | Source: Ambulatory Visit | Attending: Radiation Oncology | Admitting: Radiation Oncology

## 2020-03-05 ENCOUNTER — Other Ambulatory Visit: Payer: Self-pay

## 2020-03-05 DIAGNOSIS — C679 Malignant neoplasm of bladder, unspecified: Secondary | ICD-10-CM | POA: Diagnosis not present

## 2020-03-05 DIAGNOSIS — Z510 Encounter for antineoplastic radiation therapy: Secondary | ICD-10-CM | POA: Diagnosis not present

## 2020-03-05 DIAGNOSIS — C671 Malignant neoplasm of dome of bladder: Secondary | ICD-10-CM | POA: Diagnosis not present

## 2020-03-06 ENCOUNTER — Other Ambulatory Visit: Payer: Self-pay

## 2020-03-06 ENCOUNTER — Ambulatory Visit
Admission: RE | Admit: 2020-03-06 | Discharge: 2020-03-06 | Disposition: A | Discharge status: Home / Self Care | Payer: Medicare Other | Source: Ambulatory Visit | Attending: Radiation Oncology | Admitting: Radiation Oncology

## 2020-03-06 DIAGNOSIS — C671 Malignant neoplasm of dome of bladder: Secondary | ICD-10-CM | POA: Diagnosis not present

## 2020-03-06 DIAGNOSIS — C679 Malignant neoplasm of bladder, unspecified: Secondary | ICD-10-CM | POA: Diagnosis not present

## 2020-03-06 DIAGNOSIS — Z510 Encounter for antineoplastic radiation therapy: Secondary | ICD-10-CM | POA: Diagnosis not present

## 2020-03-07 ENCOUNTER — Ambulatory Visit
Admission: RE | Admit: 2020-03-07 | Discharge: 2020-03-07 | Disposition: A | Discharge status: Home / Self Care | Payer: Medicare Other | Source: Ambulatory Visit | Attending: Radiation Oncology | Admitting: Radiation Oncology

## 2020-03-07 DIAGNOSIS — C679 Malignant neoplasm of bladder, unspecified: Secondary | ICD-10-CM | POA: Diagnosis not present

## 2020-03-07 DIAGNOSIS — C671 Malignant neoplasm of dome of bladder: Secondary | ICD-10-CM | POA: Diagnosis not present

## 2020-03-07 DIAGNOSIS — Z510 Encounter for antineoplastic radiation therapy: Secondary | ICD-10-CM | POA: Diagnosis not present

## 2020-03-10 ENCOUNTER — Ambulatory Visit: Payer: Medicare Other

## 2020-03-11 ENCOUNTER — Ambulatory Visit: Payer: Medicare Other

## 2020-03-12 ENCOUNTER — Ambulatory Visit: Payer: Medicare Other

## 2020-03-13 ENCOUNTER — Ambulatory Visit: Payer: Medicare Other

## 2020-03-14 ENCOUNTER — Ambulatory Visit: Payer: Medicare Other

## 2020-03-17 ENCOUNTER — Other Ambulatory Visit (HOSPITAL_COMMUNITY): Payer: Self-pay

## 2020-03-17 ENCOUNTER — Ambulatory Visit
Admission: RE | Admit: 2020-03-17 | Discharge: 2020-03-17 | Disposition: A | Discharge status: Home / Self Care | Payer: Medicare Other | Source: Ambulatory Visit | Attending: Radiation Oncology | Admitting: Radiation Oncology

## 2020-03-17 ENCOUNTER — Telehealth: Payer: Self-pay | Admitting: Radiation Oncology

## 2020-03-17 ENCOUNTER — Other Ambulatory Visit: Payer: Self-pay

## 2020-03-17 ENCOUNTER — Inpatient Hospital Stay (HOSPITAL_COMMUNITY): Payer: Medicare Other | Attending: Hematology | Admitting: Hematology

## 2020-03-17 VITALS — BP 135/82 | HR 80 | Temp 96.8°F | Resp 18 | Wt 180.5 lb

## 2020-03-17 DIAGNOSIS — E1165 Type 2 diabetes mellitus with hyperglycemia: Secondary | ICD-10-CM | POA: Diagnosis not present

## 2020-03-17 DIAGNOSIS — Z981 Arthrodesis status: Secondary | ICD-10-CM | POA: Diagnosis not present

## 2020-03-17 DIAGNOSIS — E1122 Type 2 diabetes mellitus with diabetic chronic kidney disease: Secondary | ICD-10-CM | POA: Diagnosis not present

## 2020-03-17 DIAGNOSIS — M25552 Pain in left hip: Secondary | ICD-10-CM | POA: Diagnosis not present

## 2020-03-17 DIAGNOSIS — Z955 Presence of coronary angioplasty implant and graft: Secondary | ICD-10-CM | POA: Diagnosis not present

## 2020-03-17 DIAGNOSIS — Z8249 Family history of ischemic heart disease and other diseases of the circulatory system: Secondary | ICD-10-CM | POA: Diagnosis not present

## 2020-03-17 DIAGNOSIS — Z801 Family history of malignant neoplasm of trachea, bronchus and lung: Secondary | ICD-10-CM | POA: Diagnosis not present

## 2020-03-17 DIAGNOSIS — R600 Localized edema: Secondary | ICD-10-CM | POA: Diagnosis not present

## 2020-03-17 DIAGNOSIS — Z8673 Personal history of transient ischemic attack (TIA), and cerebral infarction without residual deficits: Secondary | ICD-10-CM | POA: Diagnosis not present

## 2020-03-17 DIAGNOSIS — I2510 Atherosclerotic heart disease of native coronary artery without angina pectoris: Secondary | ICD-10-CM | POA: Insufficient documentation

## 2020-03-17 DIAGNOSIS — C679 Malignant neoplasm of bladder, unspecified: Secondary | ICD-10-CM | POA: Diagnosis not present

## 2020-03-17 DIAGNOSIS — F17210 Nicotine dependence, cigarettes, uncomplicated: Secondary | ICD-10-CM | POA: Diagnosis not present

## 2020-03-17 DIAGNOSIS — E1140 Type 2 diabetes mellitus with diabetic neuropathy, unspecified: Secondary | ICD-10-CM | POA: Insufficient documentation

## 2020-03-17 DIAGNOSIS — I5043 Acute on chronic combined systolic (congestive) and diastolic (congestive) heart failure: Secondary | ICD-10-CM | POA: Diagnosis not present

## 2020-03-17 DIAGNOSIS — N183 Chronic kidney disease, stage 3 unspecified: Secondary | ICD-10-CM | POA: Diagnosis not present

## 2020-03-17 DIAGNOSIS — I130 Hypertensive heart and chronic kidney disease with heart failure and stage 1 through stage 4 chronic kidney disease, or unspecified chronic kidney disease: Secondary | ICD-10-CM | POA: Diagnosis not present

## 2020-03-17 DIAGNOSIS — Z20822 Contact with and (suspected) exposure to covid-19: Secondary | ICD-10-CM | POA: Diagnosis not present

## 2020-03-17 DIAGNOSIS — Z833 Family history of diabetes mellitus: Secondary | ICD-10-CM | POA: Diagnosis not present

## 2020-03-17 DIAGNOSIS — N3289 Other specified disorders of bladder: Secondary | ICD-10-CM | POA: Diagnosis not present

## 2020-03-17 DIAGNOSIS — C678 Malignant neoplasm of overlapping sites of bladder: Secondary | ICD-10-CM | POA: Diagnosis not present

## 2020-03-17 DIAGNOSIS — E782 Mixed hyperlipidemia: Secondary | ICD-10-CM | POA: Diagnosis not present

## 2020-03-17 DIAGNOSIS — Z825 Family history of asthma and other chronic lower respiratory diseases: Secondary | ICD-10-CM | POA: Diagnosis not present

## 2020-03-17 DIAGNOSIS — I5023 Acute on chronic systolic (congestive) heart failure: Secondary | ICD-10-CM | POA: Diagnosis not present

## 2020-03-17 DIAGNOSIS — M545 Low back pain, unspecified: Secondary | ICD-10-CM | POA: Diagnosis not present

## 2020-03-17 DIAGNOSIS — I480 Paroxysmal atrial fibrillation: Secondary | ICD-10-CM | POA: Diagnosis not present

## 2020-03-17 DIAGNOSIS — I4891 Unspecified atrial fibrillation: Secondary | ICD-10-CM | POA: Diagnosis not present

## 2020-03-17 DIAGNOSIS — R0602 Shortness of breath: Secondary | ICD-10-CM | POA: Insufficient documentation

## 2020-03-17 DIAGNOSIS — I110 Hypertensive heart disease with heart failure: Secondary | ICD-10-CM | POA: Insufficient documentation

## 2020-03-17 DIAGNOSIS — I509 Heart failure, unspecified: Secondary | ICD-10-CM | POA: Diagnosis not present

## 2020-03-17 DIAGNOSIS — J811 Chronic pulmonary edema: Secondary | ICD-10-CM | POA: Diagnosis not present

## 2020-03-17 DIAGNOSIS — N1831 Chronic kidney disease, stage 3a: Secondary | ICD-10-CM

## 2020-03-17 DIAGNOSIS — Z79899 Other long term (current) drug therapy: Secondary | ICD-10-CM | POA: Insufficient documentation

## 2020-03-17 DIAGNOSIS — D631 Anemia in chronic kidney disease: Secondary | ICD-10-CM | POA: Diagnosis not present

## 2020-03-17 DIAGNOSIS — I4892 Unspecified atrial flutter: Secondary | ICD-10-CM | POA: Diagnosis not present

## 2020-03-17 DIAGNOSIS — Z7984 Long term (current) use of oral hypoglycemic drugs: Secondary | ICD-10-CM | POA: Insufficient documentation

## 2020-03-17 DIAGNOSIS — D649 Anemia, unspecified: Secondary | ICD-10-CM

## 2020-03-17 DIAGNOSIS — Z794 Long term (current) use of insulin: Secondary | ICD-10-CM | POA: Diagnosis not present

## 2020-03-17 DIAGNOSIS — N179 Acute kidney failure, unspecified: Secondary | ICD-10-CM | POA: Diagnosis not present

## 2020-03-17 DIAGNOSIS — J90 Pleural effusion, not elsewhere classified: Secondary | ICD-10-CM | POA: Diagnosis not present

## 2020-03-17 DIAGNOSIS — D509 Iron deficiency anemia, unspecified: Secondary | ICD-10-CM | POA: Diagnosis not present

## 2020-03-17 DIAGNOSIS — C671 Malignant neoplasm of dome of bladder: Secondary | ICD-10-CM | POA: Diagnosis not present

## 2020-03-17 LAB — COMPREHENSIVE METABOLIC PANEL
ALT: 21 U/L (ref 0–44)
AST: 20 U/L (ref 15–41)
Albumin: 3.3 g/dL — ABNORMAL LOW (ref 3.5–5.0)
Alkaline Phosphatase: 176 U/L — ABNORMAL HIGH (ref 38–126)
Anion gap: 10 (ref 5–15)
BUN: 46 mg/dL — ABNORMAL HIGH (ref 8–23)
CO2: 29 mmol/L (ref 22–32)
Calcium: 9 mg/dL (ref 8.9–10.3)
Chloride: 95 mmol/L — ABNORMAL LOW (ref 98–111)
Creatinine, Ser: 1.46 mg/dL — ABNORMAL HIGH (ref 0.61–1.24)
GFR, Estimated: 50 mL/min — ABNORMAL LOW (ref >60)
Glucose, Bld: 233 mg/dL — ABNORMAL HIGH (ref 70–99)
Potassium: 3.7 mmol/L (ref 3.5–5.1)
Sodium: 134 mmol/L — ABNORMAL LOW (ref 135–145)
Total Bilirubin: 0.5 mg/dL (ref 0.3–1.2)
Total Protein: 7.3 g/dL (ref 6.5–8.1)

## 2020-03-17 LAB — CBC WITH DIFFERENTIAL/PLATELET
Abs Immature Granulocytes: 0.03 10*3/uL (ref 0.00–0.07)
Basophils Absolute: 0.1 10*3/uL (ref 0.0–0.1)
Basophils Relative: 1 %
Eosinophils Absolute: 0.1 10*3/uL (ref 0.0–0.5)
Eosinophils Relative: 1 %
HCT: 33.4 % — ABNORMAL LOW (ref 39.0–52.0)
Hemoglobin: 10.2 g/dL — ABNORMAL LOW (ref 13.0–17.0)
Immature Granulocytes: 0 %
Lymphocytes Relative: 7 %
Lymphs Abs: 0.5 10*3/uL — ABNORMAL LOW (ref 0.7–4.0)
MCH: 25.9 pg — ABNORMAL LOW (ref 26.0–34.0)
MCHC: 30.5 g/dL (ref 30.0–36.0)
MCV: 84.8 fL (ref 80.0–100.0)
Monocytes Absolute: 0.5 10*3/uL (ref 0.1–1.0)
Monocytes Relative: 7 %
Neutro Abs: 5.8 10*3/uL (ref 1.7–7.7)
Neutrophils Relative %: 84 %
Platelets: 284 10*3/uL (ref 150–400)
RBC: 3.94 MIL/uL — ABNORMAL LOW (ref 4.22–5.81)
RDW: 16.5 % — ABNORMAL HIGH (ref 11.5–15.5)
WBC: 7 10*3/uL (ref 4.0–10.5)
nRBC: 0 % (ref 0.0–0.2)

## 2020-03-17 LAB — FOLATE: Folate: 12.9 ng/mL (ref >5.9)

## 2020-03-17 LAB — FERRITIN: Ferritin: 22 ng/mL — ABNORMAL LOW (ref 24–336)

## 2020-03-17 LAB — IRON AND TIBC
Iron: 31 ug/dL — ABNORMAL LOW (ref 45–182)
Saturation Ratios: 8 % — ABNORMAL LOW (ref 17.9–39.5)
TIBC: 400 ug/dL (ref 250–450)
UIBC: 369 ug/dL

## 2020-03-17 LAB — VITAMIN B12: Vitamin B-12: 490 pg/mL (ref 180–914)

## 2020-03-17 MED ORDER — OXYCODONE HCL 10 MG PO TABS: 10.0000 mg | ORAL_TABLET | Freq: Four times a day (QID) | ORAL | 0 refills | Status: DC | PRN

## 2020-03-17 NOTE — Patient Instructions (Signed)
[place] at [facility] Discharge Instructions  You were seen today by Dr. Delton Coombes. He went over your recent results. You had labs drawn today for further analysis. You will be prescribed oxycodone 10 mg to take every 6 hours for your back pain. You will be scheduled for an MRI scan of your lower back to determine the cause of the pain. Dr. Delton Coombes will see you back after the scan for follow up.   Thank you for choosing [place] at [facility] to provide your oncology and hematology care.  To afford each patient quality time with our provider, please arrive at least 15 minutes before your scheduled appointment time.   If you have a lab appointment with the [facility] please come in thru the Main Entrance and check in at the main information desk  You need to re-schedule your appointment should you arrive 10 or more minutes late.  We strive to give you quality time with our providers, and arriving late affects you and other patients whose appointments are after yours.  Also, if you no show three or more times for appointments you may be dismissed from the clinic at the providers discretion.     Again, thank you for choosing [facility].  Our hope is that these requests will decrease the amount of time that you wait before being seen by our physicians.       _____________________________________________________________  Should you have questions after your visit to [facility], please contact our office at [phone] between the hours of 8:00 a.m. and 4:30 p.m.  Voicemails left after 4:00 p.m. will not be returned until the following business day.  For prescription refill requests, have your pharmacy contact our office and allow 72 hours.    Cancer Center Support Programs:   > Cancer Support Group  2nd Tuesday of the month 1pm-2pm, Journey Room

## 2020-03-17 NOTE — Progress Notes (Signed)
[place], [place] [address]   CLINIC:  Medical Oncology/Hematology  PCP:  Wannetta Sender, [place] [address] [phone]   REASON FOR VISIT:  Follow-up for bladder cancer  PRIOR THERAPY:  1. TURBT on 10/08/2019. 2. TURBT on 01/26/2020. 3. Bladder radiation 19.80 Gy in 11 fractions through 02/19/2020.  NGS Results: Not done  CURRENT THERAPY: Radiation to pelvic bladder 45 Gy in 25 fractions  BRIEF ONCOLOGIC HISTORY:  Oncology History   No history exists.    CANCER STAGING: Cancer Staging No matching staging information was found for the patient.  INTERVAL HISTORY:  [name], a 75 y.o. male, returns for routine follow-up of his bladder cancer. Richard [name] was last seen on 01/01/2020.   Today he is accompanied by his wife and he reports feeling poorly. He continues going to radiation for his bladder radiation. He complains of having lower back pain which worsened in the past several days and hurts whenever he lies down or sits; he was able to recline and sleep last week. His leg swelling is now extending above his knees. He also complains of SOB when he reclines. He reports that taking Percocet every 4 hours used to help, but now they don't help at all; he denies feeling drowsy taking them.  He will see [name] on 1/26. He will finish radiation around the middle of February.   REVIEW OF SYSTEMS:  Review of Systems  Constitutional: Positive for appetite change (depleted) and fatigue (depleted).  Respiratory: Positive for shortness of breath.   Cardiovascular: Positive for leg swelling (above knees).  Gastrointestinal: Positive for constipation and nausea.  Musculoskeletal: Positive for back pain (7/10 lower back and hip pain) and gait problem.  Neurological: Positive for gait problem and numbness.  Psychiatric/Behavioral: Positive for sleep disturbance.  All other  systems reviewed and are negative.   PAST MEDICAL/SURGICAL HISTORY:  Past Medical History:  Diagnosis Date  . Anemia 12/2019  . Bladder cancer ([place])   . CAD (coronary artery disease)    a. 2007 - DES x 2 proximal to mid RCA b. 09/2017: DES to mid LAD and OM2. Patent stents along RCA.   [name] CHF (congestive heart failure) ([facility])   . Essential hypertension   . History of atrial flutter    Status post ablation 2008 - Dr. Caryl Comes  . History of transient ischemic attack (TIA)   . Mixed hyperlipidemia    Statin intolerance  . Pneumonia   . Type 2 diabetes mellitus ([facility])    A1C 11.5 06/2017   Past Surgical History:  Procedure Laterality Date  . BALLOON ANGIOPLASTY, ARTERY    . BICEPS TENDON REPAIR Left   . CARDIAC ELECTROPHYSIOLOGY MAPPING AND ABLATION    . CARPAL TUNNEL RELEASE Bilateral   . cervical neck fusion     x 4  . CORONARY ANGIOPLASTY    . CORONARY STENT INTERVENTION N/A 10/17/2017   Procedure: CORONARY STENT INTERVENTION;  Surgeon: Martinique, Peter M, MD;  Location: [facility];  Service: Cardiovascular;  Laterality: N/A;  . CORONARY STENT PLACEMENT     x 2  . CYSTOSCOPY W/ RETROGRADES Bilateral 10/08/2019   Procedure: CYSTOSCOPY WITH BILATERAL RETROGRADE PYELOGRAM;  Surgeon: Cleon Gustin, MD;  Location: [facility];  Service: Urology;  Laterality: Bilateral;  . CYSTOSCOPY W/ URETERAL STENT PLACEMENT Bilateral 01/26/2020   Procedure: CYSTOSCOPY WITH RETROGRADE PYELOGRAM bilateral right ureter stent  removal;  Surgeon: Irine Seal, MD;  Location: [facility];  Service: Urology;  Laterality: Bilateral;  . CYSTOSCOPY WITH FULGERATION N/A 01/26/2020   Procedure: CYSTOSCOPY WITH FULGERATION, CLOT EVACUATION transurethral resection bladder tumor;  Surgeon: Irine Seal, MD;  Location: [facility];  Service: Urology;  Laterality: N/A;  . CYSTOSCOPY WITH URETHRAL DILATATION  10/08/2019   Procedure: CYSTOSCOPY WITH URETHRAL DILATATION;  Surgeon: Cleon Gustin, MD;  Location: [facility];  Service:  Urology;;  . LEFT HEART CATH AND CORONARY ANGIOGRAPHY N/A 10/17/2017   Procedure: LEFT HEART CATH AND CORONARY ANGIOGRAPHY;  Surgeon: Martinique, Peter M, MD;  Location: [facility];  Service: Cardiovascular;  Laterality: N/A;  . ROTATOR CUFF REPAIR Right    x 4  . TIBIA FRACTURE SURGERY Left   . TRANSURETHRAL RESECTION OF BLADDER TUMOR N/A 10/08/2019   Procedure: TRANSURETHRAL RESECTION OF BLADDER TUMOR (TURBT);  Surgeon: Cleon Gustin, MD;  Location: [facility];  Service: Urology;  Laterality: N/A;    SOCIAL HISTORY:  Social History   Socioeconomic History  . Marital status: Married    Spouse name: Not on file  . Number of children: 4  . Years of education: Not on file  . Highest education level: Not on file  Occupational History  . Occupation: truck Geophysicist/field seismologist    Comment: retired  Tobacco Use  . Smoking status: Current Every Day Smoker    Packs/day: 1.00    Years: 54.00    Pack years: 54.00    Types: Cigarettes  . Smokeless tobacco: Never Used  Vaping Use  . Vaping Use: Never used  Substance and Sexual Activity  . Alcohol use: No    Alcohol/week: 0.0 standard drinks    Comment: rare  . Drug use: No  . Sexual activity: Not Currently  Other Topics Concern  . Not on file  Social History Narrative  . Not on file   Social Determinants of Health   Financial Resource Strain: Low Risk   . Difficulty of Paying Living Expenses: Not hard at all  Food Insecurity: No Food Insecurity  . Worried About Charity fundraiser in the Last Year: Never true  . Ran Out of Food in the Last Year: Never true  Transportation Needs: No Transportation Needs  . Lack of Transportation (Medical): No  . Lack of Transportation (Non-Medical): No  Physical Activity: Inactive  . Days of Exercise per Week: 0 days  . Minutes of Exercise per Session: 0 min  Stress: No Stress Concern Present  . Feeling of Stress : Not at all  Social Connections: Moderately Isolated  . Frequency of Communication  with Friends and Family: Three times a week  . Frequency of Social Gatherings with Friends and Family: Three times a week  . Attends Religious Services: Never  . Active Member of Clubs or Organizations: No  . Attends Archivist Meetings: Never  . Marital Status: Married  Human resources officer Violence: Not At Risk  . Fear of Current or Ex-Partner: No  . Emotionally Abused: No  . Physically Abused: No  . Sexually Abused: No    FAMILY HISTORY:  Family History  Problem Relation Age of Onset  . Diabetes Mother   . Heart disease Mother   . Heart disease Brother   . Lung cancer Brother   . Emphysema Father   . Lung cancer Brother        x 2  . Lupus Daughter   . Colon cancer Neg Hx   .  Esophageal cancer Neg Hx   . Rectal cancer Neg Hx   . Stomach cancer Neg Hx   . Prostate cancer Neg Hx     CURRENT MEDICATIONS:  Current Outpatient Medications  Medication Sig Dispense Refill  . ALPRAZolam (XANAX) 0.5 MG tablet Take 0.5 mg by mouth 2 (two) times daily as needed.    [name] amLODipine (NORVASC) 2.5 MG tablet Take 2.5 mg by mouth daily.    [name] atorvastatin (LIPITOR) 10 MG tablet Take 10 mg by mouth daily.    . B-D ULTRAFINE III SHORT PEN 31G X 8 MM MISC Inject into the skin.    [name] glipiZIDE (GLUCOTROL XL) 10 MG 24 hr tablet Take 10 mg by mouth 2 (two) times daily.    . isosorbide mononitrate (IMDUR) 30 MG 24 hr tablet Take 1 tablet (30 mg total) by mouth at bedtime. 30 tablet 6  . metFORMIN (GLUCOPHAGE) 1000 MG tablet Take 1,000 mg by mouth 2 (two) times daily with a meal.    . metoprolol succinate (TOPROL-XL) 100 MG 24 hr tablet Take 1 tablet (100 mg total) by mouth daily. Take with or immediately following a meal. 60 tablet 3  . nitroGLYCERIN (NITROSTAT) 0.4 MG SL tablet Place 1 tablet (0.4 mg total) under the tongue every 5 (five) minutes as needed for chest pain. 25 tablet 3  . Oxycodone HCl 10 MG TABS Take 1 tablet (10 mg total) by mouth 4 (four) times daily as needed. 60 tablet 0   . oxyCODONE-acetaminophen (PERCOCET) 5-325 MG tablet Take 1 tablet by mouth every 4 (four) hours as needed for moderate pain or severe pain. 30 tablet 0  . torsemide (DEMADEX) 20 MG tablet Take 2 tablets (40 mg total) by mouth daily. 180 tablet 3   No current facility-administered medications for this visit.    ALLERGIES:  Allergies  Allergen Reactions  . Statins     Leg pain, tolerates lipitor     PHYSICAL EXAM:  Performance status (ECOG): 1 - Symptomatic but completely ambulatory  Vitals:   03/17/20 1549  BP: 135/82  Pulse: 80  Resp: 18  Temp: (!) 96.8 F (36 C)  SpO2: 100%   Wt Readings from Last 3 Encounters:  03/17/20 180 lb 8 oz (81.9 kg)  02/27/20 180 lb (81.6 kg)  02/04/20 182 lb 6.4 oz (82.7 kg)   Physical Exam Vitals reviewed.  Constitutional:      Appearance: Normal appearance.  Cardiovascular:     Rate and Rhythm: Normal rate and regular rhythm.     Pulses: Normal pulses.     Heart sounds: Normal heart sounds.  Pulmonary:     Effort: Pulmonary effort is normal.     Breath sounds: Normal breath sounds.  Musculoskeletal:     Lumbar back: Bony tenderness (lumbar spine) present.     Right lower leg: Edema (2+) present.     Left lower leg: Edema (2+) present.  Neurological:     General: No focal deficit present.     Mental Status: He is alert and oriented to person, place, and time.  Psychiatric:        Mood and Affect: Mood normal.        Behavior: Behavior normal.      LABORATORY DATA:  I have reviewed the labs as listed.  CBC Latest Ref Rng & Units 02/27/2020 02/04/2020 01/29/2020  WBC 4.0 - 10.5 K/uL 6.0 7.7 7.0  Hemoglobin 13.0 - 17.0 g/dL 9.5(L) 9.1(L) 8.6(L)  Hematocrit 39.0 - 52.0 %  30.5(L) 29.7(L) 26.7(L)  Platelets 150 - 400 K/uL 256 273 207   CMP Latest Ref Rng & Units 02/27/2020 02/11/2020 02/04/2020  Glucose 70 - 99 mg/dL 238(H) 137(H) 129(H)  BUN 8 - 23 mg/dL 32(H) 31(H) 27(H)  Creatinine 0.61 - 1.24 mg/dL 1.40(H) 1.26(H) 1.11   Sodium 135 - 145 mmol/L 137 138 136  Potassium 3.5 - 5.1 mmol/L 4.1 4.3 4.0  Chloride 98 - 111 mmol/L 97(L) 100 101  CO2 22 - 32 mmol/L $RemoveB'30 29 26  '[id number]$ Calcium 8.9 - 10.3 mg/dL 8.8(L) 8.7(L) 8.7(L)  Total Protein 6.5 - 8.1 g/dL - - -  Total Bilirubin 0.3 - 1.2 mg/dL - - -  Alkaline Phos 38 - 126 U/L - - -  AST 15 - 41 U/L - - -  ALT 0 - 44 U/L - - -    DIAGNOSTIC IMAGING:  I have independently reviewed the scans and discussed with the patient. No results found.   ASSESSMENT:  1. High-grade muscle invasive bladder cancer: -Presentation to the ER with TIA symptoms and hematuria. -24 pound weight loss in the last 6 months despite having good appetite and eating well. -CTAP on 09/16/2019 shows mass in the left aspect of the anterior bladder dome measuring 2.7 x 2.3 x 1.8 cm.No evidence of lymphadenopathy or metastatic disease in the abdomen or pelvis. Prominent subcentimeter retroperitoneal lymph nodes unchanged from 2007. -Cystoscopy, bilateral retrograde pyelography, transurethral resection of bladder tumor and right JJ ureteral stent placement on 10/08/2019. Cystoscopy showed 5 cm dome/posterior wall tumor. 1 cm papillary tumor involving the right ureteral orifice. 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 Carcinoma invades muscularis propria. -He met with Dr. Tammi Klippel and discussed the option of chemoradiation. -He also met with Dr. Tresa Moore and discussed surgical options. He is reluctant to consider surgery. -Bone scan on 11/08/2019 was negative for meta stasis. -CT chest on 11/21/2019 shows no suspicious findings for metastatic disease. Small mediastinal lymph nodes likely reactive.  2. Social/family history: -He is a retired Programmer, systems. Current active smoker, 1 pack/day for 57 years. He is seen with his wife who is a retired Therapist, sports at [facility]. -2 brothers had lung cancer and were smokers.  Granddaughter died of neuroblastoma at age 40.   PLAN:  1. High-grade muscle invasive bladder cancer: -He was started on radiation therapy for his bladder. -He reports feeling very tired in the last 1 week. He missed treatments last week because of bad weather and feeling tired. -I have done labs in our office which showed creatinine 1.46 and at his baseline. Potassium is normal. Hemoglobin is 10.2 with normal white count and platelet count. -Ferritin is 22 and percent saturation is 8. He will benefit from parenteral iron therapy. -He complained of lower back pain and left hip pain. I have recommended doing an MRI of the lumbar spine with and without contrast. RTC after MRI.  2. Poorly controlled diabetes: -Continue glipizide and Metformin.  3. Neuropathy: -He has occasional numbness in the toes when his feet are swollen.  4.  CHF: -He has EF 25-30%. His wife reports that he has more leg swelling. -He will follow up with [name] Friday.  5. Low back pain: -He is taking Percocet 5/325 which is not helping. -We will increase to oxycodone 10 mg every 6 hours as needed.   Orders placed this encounter:  No orders of the defined types were placed in this encounter.    Derek Jack, MD Deneise Lever  [facility] [phone]   I, Milinda Antis, am acting as a scribe for Dr. Sanda Linger.  I, Derek Jack MD, have reviewed the above documentation for accuracy and completeness, and I agree with the above.

## 2020-03-17 NOTE — Progress Notes (Signed)
Patient presented today for office visit.  Dr. Delton Coombes ordered labs to be drawn.  This nurse obtained blood specimen from Left AC.  Tolerated procedure well.  Patient discharged alert and in stable condition.  Left via  wheelchair with family member.

## 2020-03-17 NOTE — Telephone Encounter (Signed)
Discovered patient has missed five consecutive radiation treatments. Phoned to inquire. Spoke with patient and wife over speaker phone. Reports he has missed treatment mostly due to snow. Explained they reside in [place] close to [place]. Reports fluid in legs, ankle and feet remain significant but patient scheduled to follow up with Dr. Myles Gip PA this week. Reports pain medication (Percocet 5/325 q4h) is no longer holding him. Complains of left worse than right low back bony pelvic pain worse with ambulation. Explains pain is such that he isn't sleeping well. Reports numbness and tingling in his left leg. Denies bowel or bladder incontinence. Patient scheduled to follow up with Dr. Raliegh Ip at 1615 today. Stressed they discuss this new pain with Dr. Raliegh Ip. Both verbalized understanding. Patient confirms he will be present for 1320 treatment today. Informed treatment machine, L1, and Dr. Tammi Klippel of these findings.

## 2020-03-18 ENCOUNTER — Ambulatory Visit
Admission: RE | Admit: 2020-03-18 | Discharge: 2020-03-18 | Disposition: A | Discharge status: Home / Self Care | Payer: Medicare Other | Source: Ambulatory Visit | Attending: Radiation Oncology | Admitting: Radiation Oncology

## 2020-03-18 DIAGNOSIS — C671 Malignant neoplasm of dome of bladder: Secondary | ICD-10-CM | POA: Diagnosis not present

## 2020-03-18 NOTE — Progress Notes (Signed)
Cardiology Office Note    Date:  03/19/2020   ID:  Richard [name], DOB 03/01/1945, MRN [id number]  PCP:  Wannetta Sender, FNP  Cardiologist: Rozann Lesches, MD EPS: None  No chief complaint on file.   History of Present Illness:  Richard [name] is a 75 y.o. male CAD status post DES x2 to the proximal mid RCA 2007, DES to the mid LAD and OM 2 09/2017 patent stents in the RCA at that time.  Off all antiplatelet therapy and is a poor candidate for PCI because we cannot safely use DAPT.  Ischemic cardiomyopathy LVEF 30 to 35%, bladder CA, also has hypertension, history of atrial flutter status post ablation in 2008, HLD statin intolerant, TIA, DM type II, history of AKI. Monitor 01/24/20 showed Atrial fib per notes  Last saw [name] 02/27/2020 and had acute on chronic systolic CHF.  Demadex was increased 40 mg daily and he was hoping to potentially add Entresto back at low-dose. Weight was 180 lbs that day. Crt 1.40 02/27/20 and 1.46 yesterday. Weight today 179. Hasn't put much fluid out. Doesn't feel too good. Short of breath. HR 129 when he walked in [facility]ADS VASC=5    Past Medical History:  Diagnosis Date  . Anemia 12/2019  . Bladder cancer ([place])   . CAD (coronary artery disease)    a. 2007 - DES x 2 proximal to mid RCA b. 09/2017: DES to mid LAD and OM2. Patent stents along RCA.   [name] CHF (congestive heart failure) ([place])   . Essential hypertension   . History of atrial flutter    Status post ablation 2008 - Dr. Caryl Comes  . History of transient ischemic attack (TIA)   . Mixed hyperlipidemia    Statin intolerance  . Pneumonia   . Type 2 diabetes mellitus (HCC)    A1C 11.5 06/2017    Past Surgical History:  Procedure Laterality Date  . BALLOON ANGIOPLASTY, ARTERY    . BICEPS TENDON REPAIR Left   . CARDIAC ELECTROPHYSIOLOGY MAPPING AND ABLATION    . CARPAL TUNNEL RELEASE Bilateral   . cervical neck fusion     x 4  . CORONARY ANGIOPLASTY    . CORONARY STENT INTERVENTION  N/A 10/17/2017   Procedure: CORONARY STENT INTERVENTION;  Surgeon: Martinique, Peter M, MD;  Location: [place] [facility];  Service: Cardiovascular;  Laterality: N/A;  . CORONARY STENT PLACEMENT     x 2  . CYSTOSCOPY W/ RETROGRADES Bilateral 10/08/2019   Procedure: CYSTOSCOPY WITH BILATERAL RETROGRADE PYELOGRAM;  Surgeon: Cleon Gustin, MD;  Location: [facility];  Service: Urology;  Laterality: Bilateral;  . CYSTOSCOPY W/ URETERAL STENT PLACEMENT Bilateral 01/26/2020   Procedure: CYSTOSCOPY WITH RETROGRADE PYELOGRAM bilateral right ureter stent removal;  Surgeon: Irine Seal, MD;  Location: [facility];  Service: Urology;  Laterality: Bilateral;  . CYSTOSCOPY WITH FULGERATION N/A 01/26/2020   Procedure: CYSTOSCOPY WITH FULGERATION, CLOT EVACUATION transurethral resection bladder tumor;  Surgeon: Irine Seal, MD;  [facility];  Service: Urology;  Laterality: N/A;  . CYSTOSCOPY WITH URETHRAL DILATATION  10/08/2019   Procedure: CYSTOSCOPY WITH URETHRAL DILATATION;  Surgeon: Cleon Gustin, MD;  Location: [facility];  Service: Urology;;  . LEFT HEART CATH AND CORONARY ANGIOGRAPHY N/A 10/17/2017   Procedure: LEFT HEART CATH AND CORONARY ANGIOGRAPHY;  Surgeon: Martinique, Peter M, MD;  Location: [facility] [facility];  Service: Cardiovascular;  Laterality: N/A;  . ROTATOR CUFF REPAIR Right    x 4  .  TIBIA FRACTURE SURGERY Left   . TRANSURETHRAL RESECTION OF BLADDER TUMOR N/A 10/08/2019   Procedure: TRANSURETHRAL RESECTION OF BLADDER TUMOR (TURBT);  Surgeon: Cleon Gustin, MD;  Location: [facility];  Service: Urology;  Laterality: N/A;    Current Medications: Current Meds  Medication Sig  . ALPRAZolam (XANAX) 0.5 MG tablet Take 0.5 mg by mouth 2 (two) times daily as needed.  [name] amLODipine (NORVASC) 2.5 MG tablet Take 2.5 mg by mouth daily.  [name] atorvastatin (LIPITOR) 10 MG tablet Take 10 mg by mouth daily.  . B-D ULTRAFINE III SHORT PEN 31G X 8 MM MISC Inject into the skin.  [name] glipiZIDE (GLUCOTROL XL) 10 MG 24  hr tablet Take 10 mg by mouth 2 (two) times daily.  . isosorbide mononitrate (IMDUR) 30 MG 24 hr tablet Take 1 tablet (30 mg total) by mouth at bedtime.  . metFORMIN (GLUCOPHAGE) 1000 MG tablet Take 1,000 mg by mouth 2 (two) times daily with a meal.  . metoprolol succinate (TOPROL-XL) 100 MG 24 hr tablet Take 1 tablet (100 mg total) by mouth daily. Take with or immediately following a meal.  . nitroGLYCERIN (NITROSTAT) 0.4 MG SL tablet Place 1 tablet (0.4 mg total) under the tongue every 5 (five) minutes as needed for chest pain.  . Oxycodone HCl 10 MG TABS Take 1 tablet (10 mg total) by mouth 4 (four) times daily as needed.  . torsemide (DEMADEX) 20 MG tablet Take 2 tablets (40 mg total) by mouth daily.     Allergies:   Statins   Social History   Socioeconomic History  . Marital status: Married    Spouse name: Not on file  . Number of children: 4  . Years of education: Not on file  . Highest education level: Not on file  Occupational History  . Occupation: truck Geophysicist/field seismologist    Comment: retired  Tobacco Use  . Smoking status: Current Every Day Smoker    Packs/day: 1.00    Years: 54.00    Pack years: 54.00    Types: Cigarettes  . Smokeless tobacco: Never Used  Vaping Use  . Vaping Use: Never used  Substance and Sexual Activity  . Alcohol use: No    Alcohol/week: 0.0 standard drinks    Comment: rare  . Drug use: No  . Sexual activity: Not Currently  Other Topics Concern  . Not on file  Social History Narrative  . Not on file   Social Determinants of Health   Financial Resource Strain: Low Risk   . Difficulty of Paying Living Expenses: Not hard at all  Food Insecurity: No Food Insecurity  . Worried About Charity fundraiser in the Last Year: Never true  . Ran Out of Food in the Last Year: Never true  Transportation Needs: No Transportation Needs  . Lack of Transportation (Medical): No  . Lack of Transportation (Non-Medical): No  Physical Activity: Inactive  . Days of  Exercise per Week: 0 days  . Minutes of Exercise per Session: 0 min  Stress: No Stress Concern Present  . Feeling of Stress : Not at all  Social Connections: Moderately Isolated  . Frequency of Communication with Friends and Family: Three times a week  . Frequency of Social Gatherings with Friends and Family: Three times a week  . Attends Religious Services: Never  . Active Member of Clubs or Organizations: No  . Attends Archivist Meetings: Never  . Marital Status: Married     Family History:  The patient's family history includes Diabetes in his mother; Emphysema in his father; Heart disease in his brother and mother; Lung cancer in his brother and brother; Lupus in his daughter.   ROS:   Please see the history of present illness.    ROS All other systems reviewed and are negative.   PHYSICAL EXAM:   VS:  BP (!) 152/82   Pulse (!) 129   Ht 6' (1.829 m)   Wt 179 lb 12.8 oz (81.6 kg)   SpO2 98%   BMI 24.39 kg/m   Physical Exam  GEN: Well nourished, well developed, in no acute distress  HEENT: normal  Neck: increased JVD, carotid bruits, or masses Cardiac:Irreg irreg; no murmurs, rubs, or gallops  Respiratory: rales bilaterally  GI: soft, nontender, nondistended, + BS Ext: without cyanosis, clubbing, or edema, Good distal pulses Plus 3 edema bilaterally MS: no deformity or atrophy  Skin: warm and dry, no rash Neuro:  Alert and Oriented x 3, Strength and sensation are intact Psych: euthymic mood, full affect  Wt Readings from Last 3 Encounters:  03/19/20 179 lb 12.8 oz (81.6 kg)  03/17/20 180 lb 8 oz (81.9 kg)  02/27/20 180 lb (81.6 kg)      Studies/Labs Reviewed:   EKG:  EKG is  ordered today.  The ekg ordered today demonstrates Afib with RVR  Recent Labs: 02/04/2020: Magnesium 1.5 03/17/2020: ALT 21; BUN 46; Creatinine, Ser 1.46; Hemoglobin 10.2; Platelets 284; Potassium 3.7; Sodium 134   Lipid Panel    Component Value Date/Time   CHOL 157  09/16/2019 0614   TRIG 121 09/16/2019 0614   HDL 39 (L) 09/16/2019 0614   CHOLHDL 4.0 09/16/2019 0614   VLDL 24 09/16/2019 0614   LDLCALC 94 09/16/2019 0614    Additional studies/ records that were reviewed today include:   Echocardiogram 01/24/2020:  1. Left ventricular ejection fraction, by estimation, is 30 to 35%. The  left ventricle has moderately decreased function. The left ventricle  demonstrates global hypokinesis. The left ventricular internal cavity size  was mildly dilated. Left ventricular  diastolic parameters are consistent with Grade I diastolic dysfunction  (impaired relaxation). Elevated left atrial pressure.   2. Right ventricular systolic function is normal. The right ventricular  size is normal.   3. The mitral valve is normal in structure. Mild mitral valve  regurgitation. No evidence of mitral stenosis.   4. The aortic valve is tricuspid. Aortic valve regurgitation is not  visualized. Mild aortic valve sclerosis is present, with no evidence of  aortic valve stenosis.   5. The inferior vena cava is normal in size with greater than 50%  respiratory variability, suggesting right atrial pressure of 3 mmHg.      Risk Assessment/Calculations:    CHA2DS2-VASc Score = 5  This indicates a 7.2% annual risk of stroke. The patient's score is based upon: CHF History: Yes HTN History: Yes Diabetes History: Yes Stroke History: No Vascular Disease History: Yes Age Score: 1 Gender Score: 0        ASSESSMENT:    1. Acute on chronic systolic CHF (congestive heart failure) (HCC)   2. Paroxysmal atrial fibrillation ([employer])   3. Coronary artery disease involving native coronary artery of native heart without angina pectoris   4. Bladder mass   5. AKI (acute kidney injury) ([place])      PLAN:  In order of problems listed above:   Acute on chronic systolic CHF LVEF 30 to [id number] on echo 01/2020 Demadex  increased to 40 mg daily by [name] but patient hasn't  diuresed and now in Afib with RVR. Discussed with [name] who agrees patient should be admitted for rate control and diuresis. Not a candidate for cardioversion or anticoagulation. He does have some NSR but mostly Afib.Dr.McDowell recommends oral Amiodarone load 400 mg bid-LFT's yesterday normal. IV Lasix for diuresis while watching renal closely. Dr. Denton Brick has kindly accepted patient  Afib with RVR-previous ablation 2008, not candidate for anticoagulation and CHADSVASC=5. Aim for rate control.  CAD with stents listed above.  Has been off antiplatelet therapy and is a poor candidate for PCI since he cannot take DAPT.  Angina currently being treated with Norvasc Imdur Lipitor and Toprol-no chest pain today.  Bladder CA with hematuria status post resection and clot evacuation currently undergoing radiation  History of acute renal insufficiency creatinine 1.46 03/17/2020   Shared Decision Making/Informed Consent        Medication Adjustments/Labs and Tests Ordered: Current medicines are reviewed at length with the patient today.  Concerns regarding medicines are outlined above.  Medication changes, Labs and Tests ordered today are listed in the Patient Instructions below. There are no Patient Instructions on file for this visit.   Sumner Boast, PA-C  03/19/2020 12:41 PM    [facility] [address], [place], [place]  [address] Phone: [phone]; Fax: [phone]

## 2020-03-19 ENCOUNTER — Inpatient Hospital Stay (HOSPITAL_COMMUNITY)
Admission: AD | Admit: 2020-03-19 | Discharge: 2020-03-23 | DRG: 308 | Disposition: A | Discharge status: Home / Self Care | Payer: Medicare Other | Source: Ambulatory Visit | Attending: Family Medicine | Admitting: Family Medicine

## 2020-03-19 ENCOUNTER — Encounter: Payer: Self-pay | Admitting: Physician Assistant

## 2020-03-19 ENCOUNTER — Ambulatory Visit (INDEPENDENT_AMBULATORY_CARE_PROVIDER_SITE_OTHER): Payer: Medicare Other | Admitting: Physician Assistant

## 2020-03-19 ENCOUNTER — Ambulatory Visit: Payer: Medicare Other

## 2020-03-19 ENCOUNTER — Other Ambulatory Visit: Payer: Self-pay

## 2020-03-19 VITALS — BP 152/82 | HR 129 | Ht 72.0 in | Wt 179.8 lb

## 2020-03-19 DIAGNOSIS — Z801 Family history of malignant neoplasm of trachea, bronchus and lung: Secondary | ICD-10-CM | POA: Diagnosis not present

## 2020-03-19 DIAGNOSIS — N3289 Other specified disorders of bladder: Secondary | ICD-10-CM

## 2020-03-19 DIAGNOSIS — D631 Anemia in chronic kidney disease: Secondary | ICD-10-CM | POA: Diagnosis present

## 2020-03-19 DIAGNOSIS — N179 Acute kidney failure, unspecified: Secondary | ICD-10-CM | POA: Diagnosis present

## 2020-03-19 DIAGNOSIS — Z833 Family history of diabetes mellitus: Secondary | ICD-10-CM | POA: Diagnosis not present

## 2020-03-19 DIAGNOSIS — Z20822 Contact with and (suspected) exposure to covid-19: Secondary | ICD-10-CM | POA: Diagnosis not present

## 2020-03-19 DIAGNOSIS — Z794 Long term (current) use of insulin: Secondary | ICD-10-CM | POA: Diagnosis not present

## 2020-03-19 DIAGNOSIS — Z955 Presence of coronary angioplasty implant and graft: Secondary | ICD-10-CM

## 2020-03-19 DIAGNOSIS — N1831 Chronic kidney disease, stage 3a: Secondary | ICD-10-CM | POA: Diagnosis not present

## 2020-03-19 DIAGNOSIS — D649 Anemia, unspecified: Secondary | ICD-10-CM | POA: Insufficient documentation

## 2020-03-19 DIAGNOSIS — D509 Iron deficiency anemia, unspecified: Secondary | ICD-10-CM | POA: Diagnosis present

## 2020-03-19 DIAGNOSIS — E782 Mixed hyperlipidemia: Secondary | ICD-10-CM | POA: Diagnosis present

## 2020-03-19 DIAGNOSIS — N183 Chronic kidney disease, stage 3 unspecified: Secondary | ICD-10-CM | POA: Insufficient documentation

## 2020-03-19 DIAGNOSIS — I4891 Unspecified atrial fibrillation: Secondary | ICD-10-CM | POA: Diagnosis not present

## 2020-03-19 DIAGNOSIS — I5043 Acute on chronic combined systolic (congestive) and diastolic (congestive) heart failure: Secondary | ICD-10-CM | POA: Diagnosis present

## 2020-03-19 DIAGNOSIS — I130 Hypertensive heart and chronic kidney disease with heart failure and stage 1 through stage 4 chronic kidney disease, or unspecified chronic kidney disease: Secondary | ICD-10-CM | POA: Diagnosis not present

## 2020-03-19 DIAGNOSIS — Z8673 Personal history of transient ischemic attack (TIA), and cerebral infarction without residual deficits: Secondary | ICD-10-CM | POA: Diagnosis not present

## 2020-03-19 DIAGNOSIS — E1165 Type 2 diabetes mellitus with hyperglycemia: Secondary | ICD-10-CM | POA: Diagnosis present

## 2020-03-19 DIAGNOSIS — I25110 Atherosclerotic heart disease of native coronary artery with unstable angina pectoris: Secondary | ICD-10-CM | POA: Diagnosis present

## 2020-03-19 DIAGNOSIS — E1122 Type 2 diabetes mellitus with diabetic chronic kidney disease: Secondary | ICD-10-CM | POA: Diagnosis present

## 2020-03-19 DIAGNOSIS — Z981 Arthrodesis status: Secondary | ICD-10-CM | POA: Diagnosis not present

## 2020-03-19 DIAGNOSIS — E119 Type 2 diabetes mellitus without complications: Secondary | ICD-10-CM

## 2020-03-19 DIAGNOSIS — Z8249 Family history of ischemic heart disease and other diseases of the circulatory system: Secondary | ICD-10-CM | POA: Diagnosis not present

## 2020-03-19 DIAGNOSIS — I480 Paroxysmal atrial fibrillation: Secondary | ICD-10-CM

## 2020-03-19 DIAGNOSIS — I2510 Atherosclerotic heart disease of native coronary artery without angina pectoris: Secondary | ICD-10-CM

## 2020-03-19 DIAGNOSIS — E118 Type 2 diabetes mellitus with unspecified complications: Secondary | ICD-10-CM

## 2020-03-19 DIAGNOSIS — I5023 Acute on chronic systolic (congestive) heart failure: Secondary | ICD-10-CM

## 2020-03-19 DIAGNOSIS — Z825 Family history of asthma and other chronic lower respiratory diseases: Secondary | ICD-10-CM

## 2020-03-19 DIAGNOSIS — F17210 Nicotine dependence, cigarettes, uncomplicated: Secondary | ICD-10-CM | POA: Diagnosis present

## 2020-03-19 DIAGNOSIS — C679 Malignant neoplasm of bladder, unspecified: Secondary | ICD-10-CM | POA: Diagnosis present

## 2020-03-19 DIAGNOSIS — Z79899 Other long term (current) drug therapy: Secondary | ICD-10-CM

## 2020-03-19 DIAGNOSIS — I4892 Unspecified atrial flutter: Secondary | ICD-10-CM | POA: Diagnosis present

## 2020-03-19 DIAGNOSIS — C671 Malignant neoplasm of dome of bladder: Secondary | ICD-10-CM | POA: Diagnosis not present

## 2020-03-19 DIAGNOSIS — I5022 Chronic systolic (congestive) heart failure: Secondary | ICD-10-CM

## 2020-03-19 DIAGNOSIS — R0600 Dyspnea, unspecified: Secondary | ICD-10-CM

## 2020-03-19 LAB — GLUCOSE, CAPILLARY
Glucose-Capillary: 367 mg/dL — ABNORMAL HIGH (ref 70–99)
Glucose-Capillary: 146 mg/dL — ABNORMAL HIGH (ref 70–99)

## 2020-03-19 MED ORDER — METOPROLOL TARTRATE 5 MG/5ML IV SOLN
INTRAVENOUS | Status: AC
  Administered 2020-03-19: 5 mg via INTRAVENOUS
  Filled 2020-03-19: qty 5

## 2020-03-19 MED ORDER — BISACODYL 10 MG RE SUPP: 10.0000 mg | Freq: Every day | RECTAL | Status: DC | PRN

## 2020-03-19 MED ORDER — SODIUM CHLORIDE 0.9 % IV SOLN: 250.0000 mL | INTRAVENOUS | Status: DC | PRN

## 2020-03-19 MED ORDER — SODIUM CHLORIDE 0.9% FLUSH
3.0000 mL | Freq: Two times a day (BID) | INTRAVENOUS | Status: DC
  Administered 2020-03-19 – 2020-03-22 (×6): 3 mL via INTRAVENOUS
  Administered 2020-03-23: 10 mL via INTRAVENOUS

## 2020-03-19 MED ORDER — FUROSEMIDE 10 MG/ML IJ SOLN
40.0000 mg | Freq: Two times a day (BID) | INTRAMUSCULAR | Status: DC
  Administered 2020-03-19 – 2020-03-21 (×4): 40 mg via INTRAVENOUS
  Filled 2020-03-19 (×4): qty 4

## 2020-03-19 MED ORDER — ONDANSETRON HCL 4 MG/2ML IJ SOLN
4.0000 mg | Freq: Four times a day (QID) | INTRAMUSCULAR | Status: DC | PRN
  Filled 2020-03-19: qty 2

## 2020-03-19 MED ORDER — TRAZODONE HCL 50 MG PO TABS: 50.0000 mg | ORAL_TABLET | Freq: Every evening | ORAL | Status: DC | PRN

## 2020-03-19 MED ORDER — INSULIN ASPART 100 UNIT/ML ~~LOC~~ SOLN
0.0000 [IU] | Freq: Three times a day (TID) | SUBCUTANEOUS | Status: DC
  Administered 2020-03-19: 15 [IU] via SUBCUTANEOUS
  Administered 2020-03-20: 8 [IU] via SUBCUTANEOUS
  Administered 2020-03-20 – 2020-03-21 (×3): 5 [IU] via SUBCUTANEOUS
  Administered 2020-03-21: 2 [IU] via SUBCUTANEOUS

## 2020-03-19 MED ORDER — ACETAMINOPHEN 325 MG PO TABS
650.0000 mg | ORAL_TABLET | ORAL | Status: DC | PRN
Start: 2020-03-19 — End: 2020-03-19

## 2020-03-19 MED ORDER — ONDANSETRON HCL 4 MG/2ML IJ SOLN: 4.0000 mg | Freq: Four times a day (QID) | INTRAMUSCULAR | Status: DC | PRN

## 2020-03-19 MED ORDER — ALPRAZOLAM 0.5 MG PO TABS
0.5000 mg | ORAL_TABLET | Freq: Two times a day (BID) | ORAL | Status: DC | PRN
  Administered 2020-03-21 – 2020-03-22 (×3): 0.5 mg via ORAL
  Filled 2020-03-19 (×3): qty 1

## 2020-03-19 MED ORDER — POLYETHYLENE GLYCOL 3350 17 G PO PACK: 17.0000 g | PACK | Freq: Every day | ORAL | Status: DC | PRN

## 2020-03-19 MED ORDER — ATORVASTATIN CALCIUM 10 MG PO TABS
10.0000 mg | ORAL_TABLET | Freq: Every day | ORAL | Status: DC
  Administered 2020-03-19 – 2020-03-23 (×5): 10 mg via ORAL
  Filled 2020-03-19 (×5): qty 1

## 2020-03-19 MED ORDER — METOPROLOL TARTRATE 5 MG/5ML IV SOLN: 2.5000 mg | INTRAVENOUS | Status: DC

## 2020-03-19 MED ORDER — ACETAMINOPHEN 325 MG PO TABS: 650.0000 mg | ORAL_TABLET | Freq: Four times a day (QID) | ORAL | Status: DC | PRN

## 2020-03-19 MED ORDER — OXYCODONE HCL 5 MG PO TABS
10.0000 mg | ORAL_TABLET | Freq: Three times a day (TID) | ORAL | Status: DC | PRN
  Administered 2020-03-19 – 2020-03-23 (×8): 10 mg via ORAL
  Filled 2020-03-19 (×8): qty 2

## 2020-03-19 MED ORDER — INSULIN ASPART 100 UNIT/ML ~~LOC~~ SOLN
0.0000 [IU] | Freq: Every day | SUBCUTANEOUS | Status: DC
  Administered 2020-03-20: 4 [IU] via SUBCUTANEOUS

## 2020-03-19 MED ORDER — AMIODARONE HCL 200 MG PO TABS
400.0000 mg | ORAL_TABLET | Freq: Two times a day (BID) | ORAL | Status: DC
  Administered 2020-03-19 – 2020-03-23 (×8): 400 mg via ORAL
  Filled 2020-03-19 (×9): qty 2

## 2020-03-19 MED ORDER — ACETAMINOPHEN 650 MG RE SUPP: 650.0000 mg | Freq: Four times a day (QID) | RECTAL | Status: DC | PRN

## 2020-03-19 MED ORDER — METOPROLOL SUCCINATE ER 50 MG PO TB24
100.0000 mg | ORAL_TABLET | Freq: Every day | ORAL | Status: DC
  Administered 2020-03-19 – 2020-03-23 (×5): 100 mg via ORAL
  Filled 2020-03-19 (×5): qty 2

## 2020-03-19 MED ORDER — SODIUM CHLORIDE 0.9% FLUSH: 3.0000 mL | INTRAVENOUS | Status: DC | PRN

## 2020-03-19 MED ORDER — ONDANSETRON HCL 4 MG PO TABS: 4.0000 mg | ORAL_TABLET | Freq: Four times a day (QID) | ORAL | Status: DC | PRN

## 2020-03-19 MED ORDER — DRONEDARONE HCL 400 MG PO TABS: 400.0000 mg | ORAL_TABLET | Freq: Two times a day (BID) | ORAL | Status: DC

## 2020-03-19 MED ORDER — SODIUM CHLORIDE 0.9% FLUSH
3.0000 mL | Freq: Two times a day (BID) | INTRAVENOUS | Status: DC
  Administered 2020-03-19 – 2020-03-22 (×4): 3 mL via INTRAVENOUS

## 2020-03-19 MED ORDER — METOPROLOL TARTRATE 25 MG PO TABS: 25.0000 mg | ORAL_TABLET | Freq: Two times a day (BID) | ORAL | Status: DC

## 2020-03-19 MED ORDER — ISOSORBIDE MONONITRATE ER 30 MG PO TB24
30.0000 mg | ORAL_TABLET | Freq: Every day | ORAL | Status: DC
  Administered 2020-03-19 – 2020-03-22 (×4): 30 mg via ORAL
  Filled 2020-03-19 (×4): qty 1

## 2020-03-19 NOTE — Addendum Note (Signed)
Addended by: Carylon Perches on: 03/19/2020 12:51 PM   Modules accepted: Orders

## 2020-03-19 NOTE — H&P (Signed)
Patient Demographics:    Richard [name], is a 75 y.o. male  MRN: [id number]   DOB - 26-Mar-1945  Admit Date - 03/19/2020  Outpatient Primary MD for the patient is Richard Sender, FNP   Assessment & Plan:    Principal Problem:   Atrial fibrillation with RVR ([place]) Active Problems:   Type 2 diabetes mellitus with complication, with long-term current use of insulin ([place])   Coronary artery disease involving native coronary artery of native heart with unstable angina pectoris (HCC)   Atrial flutter ([place]) - s/p ablation   Bladder mass   CKD (chronic kidney disease) stage 3, GFR 30-59 ml/min (HCC)    1) A. fib with RVR--- patient appears to have paroxysmal atrial fibrillation with RVR---Afib with RVR-previous ablation 2008 -- discussed with cardiology team, give IV metoprolol followed by p.o. metoprolol, will start oral amiodarone load -Low EF precludes Cardizem use -CHADSVASC=5. Aim for rate control.  Not a candidate for cardioversion or anticoagulation due to high bleeding risk in the setting of hematuria from bladder malignancy  2)Acute on chronic systolic CHF LVEF 30 to [id number] on echo 01/2020  --Failed oral diuretics including increased dose of Demadex at home (PTA) -Start IV Lasix 40 mg every 12 hours monitor daily weight and fluid input and output avoid ACEI/ARB/ARNI due to renal concerns  - 3)CAD with prior stents---   continue medical management, --- poor candidate for PCI since he cannot take DAPT.    Continue isosorbide and metoprolol  4)Bladder CA with hematuria status post resection and clot evacuation currently undergoing radiation--- Dr. Delton [name] aware of admission to the hospital  5)  CKD stage -3A   -creatinine is currently around 1.4, renal function appears to be close to recent baseline   -Monitor closely while diuresing renally adjust medications, avoid nephrotoxic agents / dehydration  / hypotension  6) chronic anemia--- suspect multifactorial anemia in the setting of CKD and ongoing hematuria recent anemia work-up consistent with iron deficiency anemia with serum iron of 31 and iron saturation of 8 and ferritin of 22 -B12 and folate were normal  7)DM2- Use Novolog/Humalog Sliding scale insulin with Accu-Cheks/Fingersticks as ordered  -Hold Metformin and glipizide  Disposition/Need for in-Hospital Stay- patient unable to be discharged at this time due to -A. fib with RVR requiring further rate control, CHF exacerbation failed outpatient oral diuretics needs IV diuresis*  Status is: Inpatient  Remains inpatient appropriate because:Please see above   Dispo: The patient is from: Home              Anticipated d/c is to: Home              Anticipated d/c date is: 2 days              Patient currently is not medically stable to d/c. Barriers: Not Clinically Stable-    With History of - Reviewed by me  Past Medical History:  Diagnosis  Date  . Anemia 12/2019  . Bladder cancer ([place])   . CAD (coronary artery disease)    a. 2007 - DES x 2 proximal to mid RCA b. 09/2017: DES to mid LAD and OM2. Patent stents along RCA.   [name] CHF (congestive heart failure) ([place])   . Essential hypertension   . History of atrial flutter    Status post ablation 2008 - Dr. Caryl [name]  . History of transient ischemic attack (TIA)   . Mixed hyperlipidemia    Statin intolerance  . Pneumonia   . Type 2 diabetes mellitus (HCC)    A1C 11.5 06/2017      Past Surgical History:  Procedure Laterality Date  . BALLOON ANGIOPLASTY, ARTERY    . BICEPS TENDON REPAIR Left   . CARDIAC ELECTROPHYSIOLOGY MAPPING AND ABLATION    . CARPAL TUNNEL RELEASE Bilateral   . cervical neck fusion     x 4  . CORONARY ANGIOPLASTY    . CORONARY STENT INTERVENTION N/A 10/17/2017   Procedure: CORONARY STENT  INTERVENTION;  Surgeon: Martinique, Peter M, MD;  Location: [place] [facility];  Service: Cardiovascular;  Laterality: N/A;  . CORONARY STENT PLACEMENT     x 2  . CYSTOSCOPY W/ RETROGRADES Bilateral 10/08/2019   Procedure: CYSTOSCOPY WITH BILATERAL RETROGRADE PYELOGRAM;  Surgeon: Cleon Gustin, MD;  [facility];  Service: Urology;  Laterality: Bilateral;  . CYSTOSCOPY W/ URETERAL STENT PLACEMENT Bilateral 01/26/2020   Procedure: CYSTOSCOPY WITH RETROGRADE PYELOGRAM bilateral right ureter stent removal;  Surgeon: Irine Seal, MD;  [facility];  Service: Urology;  Laterality: Bilateral;  . CYSTOSCOPY WITH FULGERATION N/A 01/26/2020   Procedure: CYSTOSCOPY WITH FULGERATION, CLOT EVACUATION transurethral resection bladder tumor;  Surgeon: Irine Seal, MD;  [facility];  Service: Urology;  Laterality: N/A;  . CYSTOSCOPY WITH URETHRAL DILATATION  10/08/2019   Procedure: CYSTOSCOPY WITH URETHRAL DILATATION;  Surgeon: Cleon Gustin, MD;  [facility];  Service: Urology;;  . LEFT HEART CATH AND CORONARY ANGIOGRAPHY N/A 10/17/2017   Procedure: LEFT HEART CATH AND CORONARY ANGIOGRAPHY;  Surgeon: Martinique, Peter M, MD;  Location: [place];  Service: Cardiovascular;  Laterality: N/A;  . ROTATOR CUFF REPAIR Right    x 4  . TIBIA FRACTURE SURGERY Left   . TRANSURETHRAL RESECTION OF BLADDER TUMOR N/A 10/08/2019   Procedure: TRANSURETHRAL RESECTION OF BLADDER TUMOR (TURBT);  Surgeon: Cleon Gustin, MD;  [facility]: [facility];  Service: Urology;  Laterality: N/A;      No chief complaint on file.     HPI:    Richard [name]  is a 75 y.o. male a 75 year old male with past medical history relevant for CAD, CKD 3 a, HTN, DM, and persistent hematuria secondary to bladder malignancy who presented for cardiology office with worsening shortness of breath palpitations and dizziness and is found to be in A. fib with RVR as well as CHF exacerbation -- -Despite increasing diuretics at home  patient continues to have significant weight gain and lower extremity edema and dyspnea on exertion  -Denies chest pains, -Additional history obtained at bedside from patient's wife -Notes from cardiology clinic reviewed No fever  Or chills  No Nausea, Vomiting or Diarrhea     Review of systems:    In addition to the HPI above,   A full Review of  Systems was done, all other systems reviewed are negative except as noted above in HPI , .    Social History:  Reviewed  by me    Social History   Tobacco Use  . Smoking status: Current Every Day Smoker    Packs/day: 1.00    Years: 54.00    Pack years: 54.00    Types: Cigarettes  . Smokeless tobacco: Never Used  Substance Use Topics  . Alcohol use: No    Alcohol/week: 0.0 standard drinks    Comment: rare       Family History :  Reviewed by me    Family History  Problem Relation Age of Onset  . Diabetes Mother   . Heart disease Mother   . Heart disease Brother   . Lung cancer Brother   . Emphysema Father   . Lung cancer Brother        x 2  . Lupus Daughter   . Colon cancer Neg Hx   . Esophageal cancer Neg Hx   . Rectal cancer Neg Hx   . Stomach cancer Neg Hx   . Prostate cancer Neg Hx      Home Medications:   Prior to Admission medications   Medication Sig Start Date End Date Taking? Authorizing Provider  ALPRAZolam Duanne Moron) 0.5 MG tablet Take 0.5 mg by mouth 2 (two) times daily as needed. 01/30/20   [provider]  amLODipine (NORVASC) 2.5 MG tablet Take 2.5 mg by mouth daily.    [provider]  atorvastatin (LIPITOR) 10 MG tablet Take 10 mg by mouth daily. 06/08/19   [provider]  B-D ULTRAFINE III SHORT PEN 31G X 8 MM MISC Inject into the skin. 09/18/19   [provider]  glipiZIDE (GLUCOTROL XL) 10 MG 24 hr tablet Take 10 mg by mouth 2 (two) times daily. 07/16/19   [provider]  isosorbide mononitrate (IMDUR) 30 MG 24 hr tablet Take 1 tablet (30 mg total)  by mouth at bedtime. 05/04/18   Satira Sark, MD  metFORMIN (GLUCOPHAGE) 1000 MG tablet Take 1,000 mg by mouth 2 (two) times daily with a meal.    [provider]  metoprolol succinate (TOPROL-XL) 100 MG 24 hr tablet Take 1 tablet (100 mg total) by mouth daily. Take with or immediately following a meal. 01/30/20   Almyra Deforest, PA  nitroGLYCERIN (NITROSTAT) 0.4 MG SL tablet Place 1 tablet (0.4 mg total) under the tongue every 5 (five) minutes as needed for chest pain. 07/09/19   Satira Sark, MD  Oxycodone HCl 10 MG TABS Take 1 tablet (10 mg total) by mouth 4 (four) times daily as needed. 03/17/20   Derek Jack, MD  torsemide (DEMADEX) 20 MG tablet Take 2 tablets (40 mg total) by mouth daily. 02/27/20   Satira Sark, MD     Allergies:     Allergies  Allergen Reactions  . Statins     Leg pain, tolerates lipitor      Physical Exam:   Vitals  There were no vitals taken for this visit.  Physical Examination: General appearance - alert, and in no distress a Mental status - alert, oriented to person, place, and time,  Eyes - sclera anicteric Neck - supple, no JVD elevation , Chest -diminished in bases, faint bibasilar rales  heart - S1 and S2 normal, irregular  Abdomen - soft, nontender, nondistended, no masses or organomegaly Neurological - screening mental status exam normal, neck supple without rigidity, cranial nerves II through XII intact, DTR's normal and symmetric Extremities - 2+ pedal edema noted, intact peripheral pulses  Skin - warm, dry  Data Review:    CBC Recent Labs  Lab 03/17/20 1636  WBC 7.0  HGB 10.2*  HCT 33.4*  PLT 284  MCV 84.8  MCH 25.9*  MCHC 30.5  RDW 16.5*  LYMPHSABS 0.5*  MONOABS 0.5  EOSABS 0.1  BASOSABS 0.1   ------------------------------------------------------------------------------------------------------------------  Chemistries  Recent Labs  Lab 03/17/20 1636  NA 134*  K 3.7  CL 95*  CO2 29   GLUCOSE 233*  BUN 46*  CREATININE 1.46*  CALCIUM 9.0  AST 20  ALT 21  ALKPHOS 176*  BILITOT 0.5   ------------------------------------------------------------------------------------------------------------------ estimated creatinine clearance is 48.7 mL/min (A) (by C-G formula based on SCr of 1.46 mg/dL (H)). ------------------------------------------------------------------------------------------------------------------ No results for input(s): TSH, T4TOTAL, T3FREE, THYROIDAB in the last 72 hours.  Invalid input(s): FREET3   Coagulation profile No results for input(s): INR, PROTIME in the last 168 hours. ------------------------------------------------------------------------------------------------------------------- No results for input(s): DDIMER in the last 72 hours. -------------------------------------------------------------------------------------------------------------------  Cardiac Enzymes No results for input(s): CKMB, TROPONINI, MYOGLOBIN in the last 168 hours.  Invalid input(s): CK ------------------------------------------------------------------------------------------------------------------ No results found for: BNP   Urinalysis    Component Value Date/Time   COLORURINE RED (A) 01/15/2020 1532   APPEARANCEUR Clear 02/01/2020 1447   LABSPEC  01/15/2020 1532    TEST NOT REPORTED DUE TO COLOR INTERFERENCE OF URINE PIGMENT   PHURINE  01/15/2020 1532    TEST NOT REPORTED DUE TO COLOR INTERFERENCE OF URINE PIGMENT   GLUCOSEU Negative 02/01/2020 1447   HGBUR (A) 01/15/2020 1532    TEST NOT REPORTED DUE TO COLOR INTERFERENCE OF URINE PIGMENT   BILIRUBINUR Negative 02/01/2020 1447   KETONESUR (A) 01/15/2020 1532    TEST NOT REPORTED DUE TO COLOR INTERFERENCE OF URINE PIGMENT   PROTEINUR 3+ (A) 02/01/2020 1447   PROTEINUR (A) 01/15/2020 1532    TEST NOT REPORTED DUE TO COLOR INTERFERENCE OF URINE PIGMENT   UROBILINOGEN 0.2 01/04/2007 1000   NITRITE  Negative 02/01/2020 1447   NITRITE (A) 01/15/2020 1532    TEST NOT REPORTED DUE TO COLOR INTERFERENCE OF URINE PIGMENT   LEUKOCYTESUR Negative 02/01/2020 1447   LEUKOCYTESUR (A) 01/15/2020 1532    TEST NOT REPORTED DUE TO COLOR INTERFERENCE OF URINE PIGMENT    ----------------------------------------------------------------------------------------------------------------   Imaging Results:    No results found.  Radiological Exams on Admission: No results found.  DVT Prophylaxis -SCD  AM Labs Ordered, also please review Full Orders  Family Communication: Admission, patients condition and plan of care including tests being ordered have been discussed with the patient and wife* who indicate understanding and agree with the plan   Code Status - Full Code  Likely DC to  Home   Condition   stable  Roxan Hockey M.D on 03/19/2020 at 1:03 PM Go to [email or web address] -  for contact info  [facility] - Office  [phone]

## 2020-03-19 NOTE — Addendum Note (Signed)
Addended by: Derek Jack on: 03/19/2020 07:56 AM   Modules accepted: Orders

## 2020-03-20 ENCOUNTER — Ambulatory Visit: Payer: Medicare Other

## 2020-03-20 DIAGNOSIS — N183 Chronic kidney disease, stage 3 unspecified: Secondary | ICD-10-CM

## 2020-03-20 DIAGNOSIS — C671 Malignant neoplasm of dome of bladder: Secondary | ICD-10-CM

## 2020-03-20 DIAGNOSIS — I5023 Acute on chronic systolic (congestive) heart failure: Secondary | ICD-10-CM | POA: Diagnosis not present

## 2020-03-20 DIAGNOSIS — I480 Paroxysmal atrial fibrillation: Principal | ICD-10-CM

## 2020-03-20 DIAGNOSIS — I2510 Atherosclerotic heart disease of native coronary artery without angina pectoris: Secondary | ICD-10-CM | POA: Diagnosis not present

## 2020-03-20 DIAGNOSIS — D649 Anemia, unspecified: Secondary | ICD-10-CM

## 2020-03-20 LAB — CBC
HCT: 29.7 % — ABNORMAL LOW (ref 39.0–52.0)
Hemoglobin: 9.1 g/dL — ABNORMAL LOW (ref 13.0–17.0)
MCH: 25.9 pg — ABNORMAL LOW (ref 26.0–34.0)
MCHC: 30.6 g/dL (ref 30.0–36.0)
MCV: 84.6 fL (ref 80.0–100.0)
Platelets: 268 10*3/uL (ref 150–400)
RBC: 3.51 MIL/uL — ABNORMAL LOW (ref 4.22–5.81)
RDW: 16.8 % — ABNORMAL HIGH (ref 11.5–15.5)
WBC: 6.6 10*3/uL (ref 4.0–10.5)
nRBC: 0 % (ref 0.0–0.2)

## 2020-03-20 LAB — BASIC METABOLIC PANEL
Anion gap: 9 (ref 5–15)
BUN: 41 mg/dL — ABNORMAL HIGH (ref 8–23)
CO2: 26 mmol/L (ref 22–32)
Calcium: 8.4 mg/dL — ABNORMAL LOW (ref 8.9–10.3)
Chloride: 96 mmol/L — ABNORMAL LOW (ref 98–111)
Creatinine, Ser: 1.38 mg/dL — ABNORMAL HIGH (ref 0.61–1.24)
GFR, Estimated: 54 mL/min — ABNORMAL LOW (ref >60)
Glucose, Bld: 250 mg/dL — ABNORMAL HIGH (ref 70–99)
Potassium: 3.6 mmol/L (ref 3.5–5.1)
Sodium: 131 mmol/L — ABNORMAL LOW (ref 135–145)

## 2020-03-20 LAB — GLUCOSE, CAPILLARY
Glucose-Capillary: 218 mg/dL — ABNORMAL HIGH (ref 70–99)
Glucose-Capillary: 258 mg/dL — ABNORMAL HIGH (ref 70–99)
Glucose-Capillary: 232 mg/dL — ABNORMAL HIGH (ref 70–99)
Glucose-Capillary: 311 mg/dL — ABNORMAL HIGH (ref 70–99)

## 2020-03-20 LAB — SARS CORONAVIRUS 2 (TAT 6-24 HRS): SARS Coronavirus 2: NEGATIVE

## 2020-03-20 LAB — MAGNESIUM: Magnesium: 1.6 mg/dL — ABNORMAL LOW (ref 1.7–2.4)

## 2020-03-20 MED ORDER — SODIUM CHLORIDE 0.9 % IV SOLN
510.0000 mg | Freq: Once | INTRAVENOUS | Status: AC
  Administered 2020-03-20: 510 mg via INTRAVENOUS
  Filled 2020-03-20: qty 510

## 2020-03-20 MED ORDER — MAGNESIUM SULFATE 2 GM/50ML IV SOLN
2.0000 g | Freq: Once | INTRAVENOUS | Status: AC
  Administered 2020-03-20: 2 g via INTRAVENOUS
  Filled 2020-03-20: qty 50

## 2020-03-20 MED ORDER — POTASSIUM CHLORIDE CRYS ER 20 MEQ PO TBCR
40.0000 meq | EXTENDED_RELEASE_TABLET | Freq: Once | ORAL | Status: AC
  Administered 2020-03-20: 40 meq via ORAL
  Filled 2020-03-20: qty 2

## 2020-03-20 MED ORDER — CHLORHEXIDINE GLUCONATE CLOTH 2 % EX PADS
6.0000 | MEDICATED_PAD | Freq: Every day | CUTANEOUS | Status: DC
  Administered 2020-03-20 – 2020-03-21 (×2): 6 via TOPICAL

## 2020-03-20 NOTE — Consult Note (Addendum)
Cardiology Consult    Patient ID: MONTREAL DESTEFANO; WF:[id number]; 01-Dec-1945   Admit date: 03/19/2020 Date of Consult: 03/20/2020  Primary Care Provider: Wannetta Sender, FNP Primary Cardiologist: Rozann Lesches, MD   Patient Profile    EDELMIRO NIEMI is a 75 y.o. male with past medical history of CAD (s/p DES to RCA in 2007, DES to mid-LAD and DES to OM2 in 09/2017), chronic combined systolic and diastolic CHF (EF 123XX123 in 12/2019, at 30-35% by repeat echo in 01/2020), paroxysmal atrial flutter (s/p ablation in 2008), HTN, HLD, Type 2 DM, prior TIA, bladder cancer (s/p TURBT in 09/2019 and currently undergoing radiation) and former tobacco use (quit in 06/2017) who is being seen today for the evaluation of atrial fibrillation with RVR at the request of Dr. Denton Brick.   History of Present Illness    Mr. Marovich was admitted to [facility] in 01/2020 for chest pain and troponin values remained flat. He did not undergo a repeat cardiac catheterization as he was not felt to be a candidate for DAPT given his hematuria and anemia. Echocardiogram at that time did show that his EF had improved to 30 to 35%. He did have an AKI during admission with creatinine peaking at 3.35 and Delene Loll was held. He did follow-up with [name] on 02/27/2020 and described worsening orthopnea along with lower extremity edema with a weight gain of 10 pounds since his prior visit in 01/2020. Repeat labs were obtained and Torsemide was increased to 40 mg daily with close follow-up arranged.  At the time of his visit with Ermalinda Barrios, PA-C on 03/19/2020 he reported still having significant dyspnea and lower extremity edema. Weight had only declined by 1 pound to 179 lbs since his prior visit earlier in the month. His EKG also showed atrial fibrillation with RVR and given his associated cardiomyopathy, it was recommended he be admitted for rate control. He was not a candidate for cardioversion given he cannot tolerate  anticoagulation. It was recommended to start Amiodarone 40 mg twice daily upon admission along with IV Lasix.  Labs today show WBC 6.6, Hgb 9.1, platelets 268, Na+ 131, K+ 3.6 and creatinine 1.38. Mg 1.6. COVID pending. EKG shows sinus tachycardia, HR 118 with PAC's and LVH with associated repol. He has a recorded output of -2.0 L thus far after having just received IV Lasix 40mg  x1. Scheduled for BID dosing today. Rates have been elevated on telemetry overnight with HR into the 140's at times. He denies any associated palpitations but says he still has orthopnea and lower extremity edema.    Past Medical History:  Diagnosis Date  . Anemia 12/2019  . Bladder cancer ([place])   . CAD (coronary artery disease)    a. 2007 - DES x 2 proximal to mid RCA b. 09/2017: DES to mid LAD and OM2. Patent stents along RCA.   [name] CHF (congestive heart failure) ([place])   . Essential hypertension   . History of atrial flutter    Status post ablation 2008 - Dr. Caryl Comes  . History of transient ischemic attack (TIA)   . Mixed hyperlipidemia    Statin intolerance  . Pneumonia   . Type 2 diabetes mellitus (HCC)    A1C 11.5 06/2017    Past Surgical History:  Procedure Laterality Date  . BALLOON ANGIOPLASTY, ARTERY    . BICEPS TENDON REPAIR Left   . CARDIAC ELECTROPHYSIOLOGY MAPPING AND ABLATION    . CARPAL TUNNEL RELEASE Bilateral   .  cervical neck fusion     x 4  . CORONARY ANGIOPLASTY    . CORONARY STENT INTERVENTION N/A 10/17/2017   Procedure: CORONARY STENT INTERVENTION;  Surgeon: Martinique, Peter M, MD;  Location: [place] [facility];  Service: Cardiovascular;  Laterality: N/A;  . CORONARY STENT PLACEMENT     x 2  . CYSTOSCOPY W/ RETROGRADES Bilateral 10/08/2019   Procedure: CYSTOSCOPY WITH BILATERAL RETROGRADE PYELOGRAM;  Surgeon: Cleon Gustin, MD;  Location: [facility];  Service: Urology;  Laterality: Bilateral;  . CYSTOSCOPY W/ URETERAL STENT PLACEMENT Bilateral 01/26/2020   Procedure: CYSTOSCOPY WITH  RETROGRADE PYELOGRAM bilateral right ureter stent removal;  Surgeon: Irine Seal, MD;  Location: [facility];  Service: Urology;  Laterality: Bilateral;  . CYSTOSCOPY WITH FULGERATION N/A 01/26/2020   Procedure: CYSTOSCOPY WITH FULGERATION, CLOT EVACUATION transurethral resection bladder tumor;  Surgeon: Irine Seal, MD;  Location: [facility];  Service: Urology;  Laterality: N/A;  . CYSTOSCOPY WITH URETHRAL DILATATION  10/08/2019   Procedure: CYSTOSCOPY WITH URETHRAL DILATATION;  Surgeon: Cleon Gustin, MD;  Location: [facility];  Service: Urology;;  . LEFT HEART CATH AND CORONARY ANGIOGRAPHY N/A 10/17/2017   Procedure: LEFT HEART CATH AND CORONARY ANGIOGRAPHY;  Surgeon: Martinique, Peter M, MD;  Location: [place] [facility];  Service: Cardiovascular;  Laterality: N/A;  . ROTATOR CUFF REPAIR Right    x 4  . TIBIA FRACTURE SURGERY Left   . TRANSURETHRAL RESECTION OF BLADDER TUMOR N/A 10/08/2019   Procedure: TRANSURETHRAL RESECTION OF BLADDER TUMOR (TURBT);  Surgeon: Cleon Gustin, MD;  Location: [facility];  Service: Urology;  Laterality: N/A;     Home Medications:  Prior to Admission medications   Medication Sig Start Date End Date Taking? Authorizing Provider  ALPRAZolam Duanne Moron) 0.5 MG tablet Take 0.5 mg by mouth 2 (two) times daily as needed. 01/30/20   [provider]  amLODipine (NORVASC) 2.5 MG tablet Take 2.5 mg by mouth daily.    [provider]  atorvastatin (LIPITOR) 10 MG tablet Take 10 mg by mouth daily. 06/08/19   [provider]  B-D ULTRAFINE III SHORT PEN 31G X 8 MM MISC Inject into the skin. 09/18/19   [provider]  glipiZIDE (GLUCOTROL XL) 10 MG 24 hr tablet Take 10 mg by mouth 2 (two) times daily. 07/16/19   [provider]  isosorbide mononitrate (IMDUR) 30 MG 24 hr tablet Take 1 tablet (30 mg total) by mouth at bedtime. 05/04/18   Satira Sark, MD  metFORMIN (GLUCOPHAGE) 1000 MG tablet Take 1,000 mg by mouth 2 (two) times daily with a  meal.    [provider]  metoprolol succinate (TOPROL-XL) 100 MG 24 hr tablet Take 1 tablet (100 mg total) by mouth daily. Take with or immediately following a meal. 01/30/20   Almyra Deforest, PA  nitroGLYCERIN (NITROSTAT) 0.4 MG SL tablet Place 1 tablet (0.4 mg total) under the tongue every 5 (five) minutes as needed for chest pain. 07/09/19   Satira Sark, MD  Oxycodone HCl 10 MG TABS Take 1 tablet (10 mg total) by mouth 4 (four) times daily as needed. 03/17/20   Derek Jack, MD  torsemide (DEMADEX) 20 MG tablet Take 2 tablets (40 mg total) by mouth daily. 02/27/20   Satira Sark, MD    Inpatient Medications: Scheduled Meds: . amiodarone  400 mg Oral BID  . atorvastatin  10 mg Oral Daily  . Chlorhexidine Gluconate Cloth  6 each Topical Daily  . furosemide  40 mg Intravenous BID  . insulin aspart  0-15 Units Subcutaneous TID WC  . insulin aspart  0-5 Units Subcutaneous QHS  . isosorbide mononitrate  30 mg Oral QHS  . metoprolol succinate  100 mg Oral Daily  . potassium chloride  40 mEq Oral Once  . sodium chloride flush  3 mL Intravenous Q12H  . sodium chloride flush  3 mL Intravenous Q12H   Continuous Infusions: . sodium chloride    . magnesium sulfate bolus IVPB     PRN Meds: sodium chloride, acetaminophen **OR** acetaminophen, ALPRAZolam, bisacodyl, ondansetron **OR** ondansetron (ZOFRAN) IV, oxyCODONE, polyethylene glycol, sodium chloride flush, traZODone  Allergies:    Allergies  Allergen Reactions  . Statins     Leg pain, tolerates lipitor     Social History:   Social History   Socioeconomic History  . Marital status: Married    Spouse name: Not on file  . Number of children: 4  . Years of education: Not on file  . Highest education level: Not on file  Occupational History  . Occupation: truck Geophysicist/field seismologist    Comment: retired  Tobacco Use  . Smoking status: Current Every Day Smoker    Packs/day: 1.00    Years: 54.00    Pack years: 54.00     Types: Cigarettes  . Smokeless tobacco: Never Used  Vaping Use  . Vaping Use: Never used  Substance and Sexual Activity  . Alcohol use: No    Alcohol/week: 0.0 standard drinks    Comment: rare  . Drug use: No  . Sexual activity: Not Currently  Other Topics Concern  . Not on file  Social History Narrative  . Not on file   Social Determinants of Health   Financial Resource Strain: Low Risk   . Difficulty of Paying Living Expenses: Not hard at all  Food Insecurity: No Food Insecurity  . Worried About Charity fundraiser in the Last Year: Never true  . Ran Out of Food in the Last Year: Never true  Transportation Needs: No Transportation Needs  . Lack of Transportation (Medical): No  . Lack of Transportation (Non-Medical): No  Physical Activity: Inactive  . Days of Exercise per Week: 0 days  . Minutes of Exercise per Session: 0 min  Stress: No Stress Concern Present  . Feeling of Stress : Not at all  Social Connections: Moderately Isolated  . Frequency of Communication with Friends and Family: Three times a week  . Frequency of Social Gatherings with Friends and Family: Three times a week  . Attends Religious Services: Never  . Active Member of Clubs or Organizations: No  . Attends Archivist Meetings: Never  . Marital Status: Married  Human resources officer Violence: Not At Risk  . Fear of Current or Ex-Partner: No  . Emotionally Abused: No  . Physically Abused: No  . Sexually Abused: No     Family History:    Family History  Problem Relation Age of Onset  . Diabetes Mother   . Heart disease Mother   . Heart disease Brother   . Lung cancer Brother   . Emphysema Father   . Lung cancer Brother        x 2  . Lupus Daughter   . Colon cancer Neg Hx   . Esophageal cancer Neg Hx   . Rectal cancer Neg Hx   . Stomach cancer Neg Hx   . Prostate cancer Neg Hx       Review  of Systems    General:  No chills, fever, night sweats or weight changes.  Cardiovascular:   No chest pain, palpitations, paroxysmal nocturnal dyspnea. Positive for dyspnea on exertion, orthopnea and edema.  Dermatological: No rash, lesions/masses Respiratory: No cough, Positive for dyspnea. Urologic: No hematuria, dysuria Abdominal:   No nausea, vomiting, diarrhea, bright red blood per rectum, melena, or hematemesis Neurologic:  No visual changes, wkns, changes in mental status. All other systems reviewed and are otherwise negative except as noted above.  Physical Exam/Data    Vitals:   03/20/20 0500 03/20/20 0600 03/20/20 0700 03/20/20 0833  BP: 138/89 (!) 147/94 (!) 159/98   Pulse: 77 (!) 136 (!) 127   Resp: 12  13   Temp:    (!) 97.5 F (36.4 C)  TempSrc:    Oral  SpO2: 98% 100% 99%   Weight:      Height:        Intake/Output Summary (Last 24 hours) at 03/20/2020 0852 Last data filed at 03/20/2020 0817 Gross per 24 hour  Intake 0 ml  Output 2025 ml  Net -2025 ml   Filed Weights   03/20/20 0442  Weight: 81.7 kg   Body mass index is 24.43 kg/m.   General: Pleasant elderly male appearing in NAD Psych: Normal affect. Neuro: Alert and oriented X 3. Moves all extremities spontaneously. HEENT: Normal  Neck: Supple without bruits. JVD at 9 cm. Lungs:  Resp regular and unlabored, decreased breath sounds along bases bilaterally. Heart: Irregularly irregular.  no s3, s4, or murmurs. Abdomen: Soft, non-tender, non-distended, BS + x 4.  Extremities: No clubbing or cyanosis. 1+ pitting edema bilaterally. DP/PT/Radials 2+ and equal bilaterally.   EKG:  The EKG was personally reviewed and demonstrates: Sinus tachycardia, HR 118 with PAC's and LVH with associated repol.    Telemetry:  Telemetry was personally reviewed and demonstrates: Occasional p-waves but mostly atrial fibrillation with HR variable from 90's to 140's.    Labs/Studies     Relevant CV Studies:  Cardiac Catheterization: 09/2017  Prox RCA lesion is 15% stenosed.  Mid RCA lesion is 30%  stenosed.  Mid LM lesion is 25% stenosed.  Mid LAD lesion is 85% stenosed.  A drug-eluting stent was successfully placed using a STENT SYNERGY DES 2.75X16.  Post intervention, there is a 0% residual stenosis.  Ost 2nd Mrg lesion is 50% stenosed.  Mid Cx lesion is 45% stenosed.  3rd Mrg lesion is 85% stenosed.  A drug-eluting stent was successfully placed using a STENT SYNERGY DES 3X24.  Post intervention, there is a 0% residual stenosis.  LV end diastolic pressure is normal.   1. Severe 2 vessel obstructive CAD- patient has diffuse CAD    - 85% mid LAD    - 85% large OM2 2. Patent stents in RCA.  3. Normal LVEDP 4. Successful PCI of the mid LAD with DES 5. Successful PCI of the OM2  Plan: aggressive risk factor modification. Need to stress compliance with medical therapy. DAPT for one year. Since he has no documented atrial arrhythmia since his ablation I would not resume DOAC. Anticipate DC in am  Recommend uninterrupted dual antiplatelet therapy with Aspirin 81mg  daily and Ticagrelor 90mg  twice daily for a minimum of 12 months (ACS - Class I recommendation).   Echocardiogram: 01/24/2020 IMPRESSIONS    1. Left ventricular ejection fraction, by estimation, is 30 to 35%. The  left ventricle has moderately decreased function. The left ventricle  demonstrates global hypokinesis. The left  ventricular internal cavity size  was mildly dilated. Left ventricular  diastolic parameters are consistent with Grade I diastolic dysfunction  (impaired relaxation). Elevated left atrial pressure.  2. Right ventricular systolic function is normal. The right ventricular  size is normal.  3. The mitral valve is normal in structure. Mild mitral valve  regurgitation. No evidence of mitral stenosis.  4. The aortic valve is tricuspid. Aortic valve regurgitation is not  visualized. Mild aortic valve sclerosis is present, with no evidence of  aortic valve stenosis.  5. The inferior  vena cava is normal in size with greater than 50%  respiratory variability, suggesting right atrial pressure of 3 mmHg.   Laboratory Data:  Chemistry Recent Labs  Lab 03/17/20 1636 03/20/20 0508  NA 134* 131*  K 3.7 3.6  CL 95* 96*  CO2 29 26  GLUCOSE 233* 250*  BUN 46* 41*  CREATININE 1.46* 1.38*  CALCIUM 9.0 8.4*  GFRNONAA 50* 54*  ANIONGAP 10 9    Recent Labs  Lab 03/17/20 1636  PROT 7.3  ALBUMIN 3.3*  AST 20  ALT 21  ALKPHOS 176*  BILITOT 0.5   Hematology Recent Labs  Lab 03/17/20 1636 03/20/20 0508  WBC 7.0 6.6  RBC 3.94* 3.51*  HGB 10.2* 9.1*  HCT 33.4* 29.7*  MCV 84.8 84.6  MCH 25.9* 25.9*  MCHC 30.5 30.6  RDW 16.5* 16.8*  PLT 284 268   Cardiac EnzymesNo results for input(s): TROPONINI in the last 168 hours. No results for input(s): TROPIPOC in the last 168 hours.  BNPNo results for input(s): BNP, PROBNP in the last 168 hours.  DDimer No results for input(s): DDIMER in the last 168 hours.  Radiology/Studies:  No results found.   Assessment & Plan    1. Acute on Chronic Combined Systolic and Diastolic CHF - Suspect his acute exacerbation has been triggered by his tachycardia. Torsemide was titrated as an outpatient without improvement in his symptoms and he reported progressive dyspnea on exertion, orthopnea and edema at the time of his visit.  -  He does have a known reduced EF of 30-35% by echo last month and ischemic evaluation has not been pursued given his recent AKI and anemia with medical management recommended. - He has been started on IV Lasix 40mg  BID with a recorded output of -2.0 L thus far. Would continue with IV Lasix at current dosing for now. Repeat BMET in AM.  - Continue Toprol-XL 100mg  daily. Delene Loll has been held since his AKI in 01/2020 (AKI felt to be post-renal due to hematuria and clotting). Could possibly resume at the time of discharge pending reassessment of renal function. If not able to start [facility], would consider  adding Hydralazine as he is already on nitrates.    2. Paroxysmal Atrial Fibrillation - Rates continue to go into the 140's at times. He has been started on PO Amiodarone 400mg  BID and just received his second dose this AM. Will review with Dr. Harl Bowie in regards to continuing the current regimen or switching to IV loading since he is in the ICU. Continue Toprol-XL 100mg  daily. Not on Cardizem given his cardiomyopathy.  - This patients CHA2DS2-VASc Score and unadjusted Ischemic Stroke Rate (% per year) is equal to 9.7 % stroke rate/year from a score of 6 (CHF, HTN, DM, Vascular, Age, TIA (2)). Not felt to be a candidate for anticoagulation at this time secondary to his anemia and hematuria.  3. CAD - He is s/p DES to RCA in 2007  with DES to mid-LAD and DES to OM2 in 09/2017. He denies any recent chest pain.  - No plans for ischemic testing at this time as he is not a candidate for DAPT. ASA was discontinued during his last admission secondary to hematuria and anemia. Remains on Atorvastatin 10mg  daily, Imdur 30mg  daily and Toprol-XL 100mg  daily.   4. Bladder Cancer - He is s/p TURBT in 09/2019 and currently undergoing radiation. Followed by Dr. Delton Coombes.   5. Anemia - Hgb is stable at 9.1 today. He was scheduled for an iron infusion tomorrow. Will review with the admitting team to see if there are options to have this administered while admitted.   6. Stage 3 CKD - Creatinine peaked at 3.35 in 01/2020 but had improved to 1.35 at discharge. Stable at 1.38 today.     For questions or updates, please contact [place] Please consult [email or web address] for contact info under Cardiology/STEMI.  Signed, Erma Heritage, PA-C 03/20/2020, 8:52 AM Pager: [phone]  Attending note  Patient seen and discussed with PA Ahmed Prima, I agree with her documentation. 75 yo male history of chronic systolic HF, bladder cancer with hematuria, CAD with prior stents, HTN, aflutter with prior ablation,  DM2, statin intolerance with HL, admitted from clinic with afib with RVR and acute on chronic systolic HF   WBC 6.6 Hgb 9.1 Plt 268 K 3.6 BUN 41Cr 1.38 BNP pending CXR pending   Narrow complex irregular tach, from EKGs reviewable looks like there are some p waves of varying morphology, seen more clearly on tele review, more consistent with MAT. Now in SR with PACs rates 70s, he has been started on oral amio 400mg  bid, remains on toprol 100. Continue current therapy. If afib were clearly noted would not be an anticoag candidate due to anemia and hematuria history.   Acute on chronic systolic HF. 123XX123 echo LVEF 30-35%, grade I dd. He is on IV lasix 40mg  bid. Neg 84mL so far, downtrend in Cr with diuresis consistent with venous congestion and CHF. Continue IV diuresis. No ACE/ARB/ARNI due to history of renal dysfunction  Carlyle Dolly MD

## 2020-03-20 NOTE — Progress Notes (Signed)
Via outlook email this RN informed Dr. Tammi Klippel, Freeman Caldron, PA-C and treatment therapist on L1 that the patient has been admitted to [facility] ICU. Radiation therapy for bladder ca cancelled today. Will continue to monitor patient's status as it related to radiation therapy.

## 2020-03-20 NOTE — Progress Notes (Signed)
Patient Demographics:    Chrisshawn Foree, is a 75 y.o. male, DOB - 07-Feb-1946, HV:[id number]  Admit date - 03/19/2020   Admitting Physician Maleea Camilo Denton Brick, MD  Outpatient Primary MD for the patient is [name], FNP  LOS - 1   No chief complaint on file.       Subjective:    Elisaul Hammermeister today has no fevers, no emesis,  No chest pain,  -Voiding well, dyspnea on exertion persist  Assessment  & Plan :    Principal Problem:   Atrial fibrillation with RVR (HCC) Active Problems:   Type 2 diabetes mellitus with complication, with long-term current use of insulin ([place])   Coronary artery disease involving native coronary artery of native heart with unstable angina pectoris (HCC)   Atrial flutter (HCC) - s/p ablation   Bladder mass   CKD (chronic kidney disease) stage 3, GFR 30-59 ml/min [facility])  Brief Summary- 75 y.o. male with past medical history of CAD (s/p DES to RCA in 2007, DES to mid-LAD and DES to OM2 in 09/2017), chronic combined systolic and diastolic CHF (EF [id number] in 12/2019, at 30-35% by repeat echo in 01/2020), paroxysmal atrial flutter (s/p ablation in 2008), HTN, HLD, Type 2 DM, prior TIA, bladder cancer (s/p TURBT in 09/2019 and currently undergoing radiation) and former tobacco use (quit in 06/2017)  admitted on 03/19/2020 directly from cardiology clinic due to paroxysmal A. fib with RVR as well as worsening CHF exacerbation after failing adjustments of his oral diuretic regimen  A/p 1)Paroxysmal A. Fib/Flutter with RVR--- -previous ablation 2008 -Patient also had some MAT noted this admission -Narrow complex irregular tach-- -Cardiology consult appreciated -Continue oral amiodarone load currently at 400 mg twice daily, -Continue Toprol-XL at 100 mg daily --Low EF precludes Cardizem use -CHADSVASC=6. Aim for rate control.  Not a candidate for cardioversion or anticoagulation  due to high bleeding risk in the setting of hematuria from bladder malignancy  2)Acute on chronic systolic and diastolic CHF LVEF 30 to [id number] on echo 01/2020 --Failed oral diuretics including increased dose of Demadex at home (PTA) -c/n  IV Lasix 40 mg every 12 hours monitor daily weight and fluid input and output avoid ACEI/ARB/ARNI due to renal concerns -Continue to hold Entresto =-May be a candidate for isosorbide/hydralazine combo -Fluid balance is negative  - 3)CAD with prior stents---   s/p DES to RCA in 2007 with DES to mid-LAD and DES to OM2 in 09/2017 -No ACS type symptoms at this time continue medical management, --- poor candidate for PCI since he cannot take DAPT.   Continue isosorbide, Lipitor and metoprolol  4)Bladder CA with hematuria status post resection and clot evacuation/ s/p TURBT in 09/2019 ---  currently undergoing radiation--- Dr. Delton Coombes aware of admission to the hospital  5)  CKD stage -3A   -creatinine is currently around 1.4, renal function appears to be close to recent baseline  -Monitor closely while diuresing renally adjust medications, avoid nephrotoxic agents / dehydration  / hypotension  6) chronic anemia--- suspect multifactorial anemia in the setting of CKD and ongoing hematuria recent anemia work-up consistent with iron deficiency anemia with serum iron of 31 and iron saturation of 8 and ferritin of 22 -B12 and folate were normal -  Continue iron infusion next infusion of iron is 03/21/2018  7)DM2- Use Novolog/Humalog Sliding scale insulin with Accu-Cheks/Fingersticks as ordered  -Hold Metformin and glipizide  Disposition/Need for in-Hospital Stay- patient unable to be discharged at this time due to -A. fib with RVR requiring further rate control, CHF exacerbation failed outpatient oral diuretics needs IV diuresis*  Status is: Inpatient  Remains inpatient appropriate because:Please see above   Dispo: The patient is from:  Home  Anticipated d/c is to: Home  Anticipated d/c date is: 2 days  Patient currently is not medically stable to d/c. Barriers: Not Clinically Stable  Code Status : -  Code Status: Full Code   Family Communication:   (patient is alert, awake and coherent)  Discussed with wife  Consults  :  card  DVT Prophylaxis  :   - SCDs  SCDs Start: 03/19/20 1300 Place TED hose Start: 03/19/20 1300 --- high bleeding risk    Lab Results  Component Value Date   PLT 268 03/20/2020    Inpatient Medications  Scheduled Meds: . amiodarone  400 mg Oral BID  . atorvastatin  10 mg Oral Daily  . Chlorhexidine Gluconate Cloth  6 each Topical Daily  . furosemide  40 mg Intravenous BID  . insulin aspart  0-15 Units Subcutaneous TID WC  . insulin aspart  0-5 Units Subcutaneous QHS  . isosorbide mononitrate  30 mg Oral QHS  . metoprolol succinate  100 mg Oral Daily  . sodium chloride flush  3 mL Intravenous Q12H  . sodium chloride flush  3 mL Intravenous Q12H   Continuous Infusions: . sodium chloride     PRN Meds:.sodium chloride, acetaminophen **OR** acetaminophen, ALPRAZolam, bisacodyl, ondansetron **OR** ondansetron (ZOFRAN) IV, oxyCODONE, polyethylene glycol, sodium chloride flush, traZODone   Anti-infectives (From admission, onward)   None        Objective:   Vitals:   03/20/20 0833 03/20/20 1000 03/20/20 1100 03/20/20 1200  BP:  108/72 93/62 110/70  Pulse:  (!) 107 71   Resp:  13 12 10   Temp: (!) 97.5 F (36.4 C)   (!) 97 F (36.1 C)  TempSrc: Oral   Oral  SpO2:  98% 98%   Weight:      Height:        Wt Readings from Last 3 Encounters:  03/20/20 81.7 kg  03/19/20 81.6 kg  03/17/20 81.9 kg     Intake/Output Summary (Last 24 hours) at 03/20/2020 1248 Last data filed at 03/20/2020 0817 Gross per 24 hour  Intake 0 ml  Output 2025 ml  Net -2025 ml    Physical Exam  Gen:- Awake Alert, no acute distress HEENT:- [email or web address], No sclera  icterus Neck-Supple Neck, .  Lungs-diminished breath sounds with faint bibasilar Rales* CV- S1, S2 normal, irregularly irregular and tachycardic  abd-  +ve B.Sounds, Abd Soft, No tenderness,    Extremity/Skin:- 2+ edema, pedal pulses present  Psych-affect is appropriate, oriented x3 Neuro-generalized weakness, no new focal deficits, no tremors   Data Review:   Micro Results Recent Results (from the past 240 hour(s))  SARS CORONAVIRUS 2 (TAT 6-24 HRS) Nasopharyngeal Nasopharyngeal Swab     Status: None   Collection Time: 03/19/20  5:07 PM   Specimen: Nasopharyngeal Swab  Result Value Ref Range Status   SARS Coronavirus 2 NEGATIVE NEGATIVE Final    Comment: (NOTE) SARS-CoV-2 target nucleic acids are NOT DETECTED.  The SARS-CoV-2 RNA is generally detectable in upper and lower respiratory specimens during the acute phase  of infection. Negative results do not preclude SARS-CoV-2 infection, do not rule out co-infections with other pathogens, and should not be used as the sole basis for treatment or other patient management decisions. Negative results must be combined with clinical observations, patient history, and epidemiological information. The expected result is Negative.  Fact Sheet for Patients: SugarRoll.be  Fact Sheet for Healthcare Providers: https://www.woods-mathews.com/  This test is not yet approved or cleared by the [place] FDA and  has been authorized for detection and/or diagnosis of SARS-CoV-2 by FDA under an Emergency Use Authorization (EUA). This EUA will remain  in effect (meaning this test can be used) for the duration of the COVID-19 declaration under Se ction 564(b)(1) of the Act, 21 U.S.C. section 360bbb-3(b)(1), unless the authorization is terminated or revoked sooner.  Performed at [facility], [place] [address] [place], [place] [address]     Radiology Reports No results found.   CBC Recent  Labs  Lab 03/17/20 1636 03/20/20 0508  WBC 7.0 6.6  HGB 10.2* 9.1*  HCT 33.4* 29.7*  PLT 284 268  MCV 84.8 84.6  MCH 25.9* 25.9*  MCHC 30.5 30.6  RDW 16.5* 16.8*  LYMPHSABS 0.5*  --   MONOABS 0.5  --   EOSABS 0.1  --   BASOSABS 0.1  --     Chemistries  Recent Labs  Lab 03/17/20 1636 03/20/20 0508  NA 134* 131*  K 3.7 3.6  CL 95* 96*  CO2 29 26  GLUCOSE 233* 250*  BUN 46* 41*  CREATININE 1.46* 1.38*  CALCIUM 9.0 8.4*  MG  --  1.6*  AST 20  --   ALT 21  --   ALKPHOS 176*  --   BILITOT 0.5  --    ------------------------------------------------------------------------------------------------------------------ No results for input(s): CHOL, HDL, LDLCALC, TRIG, CHOLHDL, LDLDIRECT in the last 72 hours.  Lab Results  Component Value Date   HGBA1C 9.6 (H) 01/23/2020   ------------------------------------------------------------------------------------------------------------------ No results for input(s): TSH, T4TOTAL, T3FREE, THYROIDAB in the last 72 hours.  Invalid input(s): FREET3 ------------------------------------------------------------------------------------------------------------------ Recent Labs    03/17/20 1636  VITAMINB12 490  FOLATE 12.9  FERRITIN 22*  TIBC 400  IRON 31*    Coagulation profile No results for input(s): INR, PROTIME in the last 168 hours.  No results for input(s): DDIMER in the last 72 hours.  Cardiac Enzymes No results for input(s): CKMB, TROPONINI, MYOGLOBIN in the last 168 hours.  Invalid input(s): CK ------------------------------------------------------------------------------------------------------------------ No results found for: BNP   Roxan Hockey M.D on 03/20/2020 at 12:48 PM  Go to [email or web address] - for contact info  [facility] - Office  [phone]

## 2020-03-20 NOTE — TOC Initial Note (Signed)
Transition of Care [facility]) - Initial/Assessment Note   Patient Details  Name: Richard [name] MRN: [id number] Date of Birth: 08/24/1945  Transition of [facility]) CM/SW Contact:    Sherie Don, LCSW Phone Number: 03/20/2020, 10:38 AM  Clinical Narrative: Patient is a 75 year old male who was admitted for atrial fibrillation with RVR. Patient has a history of type 2 diabetes mellitus, coronary artery disease, atrial flutter, bladder mass, and CKD. Readmission checklist completed due to high readmission score.  CSW spoke with patient to complete assessment. Per patient, he resides at home with his wife. Current DME includes a walker. Patient is not currently active with [facility] services. Patient is able to afford his medications each month, which he reports he takes as prescribed. Patient stated he does "pretty good by myself" in completing his ADLs. Wife provides transportation to appointments. TOC to follow.  Expected Discharge Plan: Home/Self Care Barriers to Discharge: Continued Medical Work up  Patient Goals and CMS Choice Patient states their goals for this hospitalization and ongoing recovery are:: Discharge home CMS Medicare.gov Compare Post Acute Care list provided to:: Patient Choice offered to / list presented to : NA  Expected Discharge Plan and Services Expected Discharge Plan: Home/Self Care In-house Referral: Clinical Social Work Discharge Planning Services: NA Post Acute Care Choice: NA Living arrangements for the past 2 months: Single Family Home              DME Arranged: N/A DME Agency: NA HH Arranged: NA [place] Agency: NA  Prior Living Arrangements/Services Living arrangements for the past 2 months: Single Family Home Lives with:: Spouse Patient language and need for interpreter reviewed:: Yes Do you feel safe going back to the place where you live?: Yes      Need for Family Participation in Patient Care: No (Comment) Care giver support system in place?: Yes  (comment) Current home services: DME [facility]) Criminal Activity/Legal Involvement Pertinent to Current Situation/Hospitalization: No - Comment as needed  Activities of Daily Living Home Assistive Devices/Equipment: Engineer, drilling (specify type) ADL Screening (condition at time of admission) Patient's cognitive ability adequate to safely complete daily activities?: Yes Is the patient deaf or have difficulty hearing?: No Does the patient have difficulty seeing, even when wearing glasses/contacts?: No Does the patient have difficulty concentrating, remembering, or making decisions?: No Patient able to express need for assistance with ADLs?: Yes Does the patient have difficulty dressing or bathing?: No Independently performs ADLs?: Yes (appropriate for developmental age) Does the patient have difficulty walking or climbing stairs?: No Weakness of Legs: Both Weakness of Arms/Hands: None  Emotional Assessment Appearance:: Appears stated age Attitude/Demeanor/Rapport: Engaged Affect (typically observed): Accepting Orientation: : Oriented to Self,Oriented to Place,Oriented to  Time,Oriented to Situation Alcohol / Substance Use: Tobacco Use Psych Involvement: No (comment)  Admission diagnosis:  Atrial fibrillation with RVR ([place]) [I48.91] Patient Active Problem List   Diagnosis Date Noted  . Normocytic anemia 03/19/2020  . CKD (chronic kidney disease) stage 3, GFR 30-59 ml/min (HCC) 03/19/2020  . Atrial fibrillation with RVR ([place]) 03/19/2020  . Gross hematuria 01/23/2020  . Acute blood loss anemia 01/23/2020  . 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 Bladder mass 09/20/2019  . Hematuria 09/16/2019  . AKI (acute kidney injury) ([place]) 09/16/2019  . Pulmonary nodules 09/16/2019  . Cigarette nicotine dependence without complication 00/86/7619  . Hyperlipidemia associated with type 2 diabetes mellitus (HCC) - Statin intolerant 10/18/2017  . Unstable angina ([place])    .  Chest pain 10/16/2017  . Statin intolerance 07/22/2017  . Stenosis of left carotid artery 07/22/2017  . Atrial flutter ([place]) - s/p ablation   . TIA (transient ischemic attack) 06/27/2017  . Medicare annual wellness visit, subsequent 01/04/2017  . Need for hepatitis C screening test 01/04/2017  . Chronic non-seasonal allergic rhinitis 04/08/2016  . Need for vaccination 04/08/2016  . DDD (degenerative disc disease), cervical 06/17/2015  . Essential hypertension 11/14/2012  . RLL pneumonia 11/14/2012  . Type 2 diabetes mellitus with complication, with long-term current use of insulin ([place]) 11/14/2012  . Coronary artery disease involving native coronary artery of native heart with unstable angina pectoris ([place]) 11/14/2012  . HIATAL HERNIA 11/24/1983   PCP:  Wannetta Sender, FNP Pharmacy:   [id number] [place], [place] [address] Phone: [phone] Fax: [place], [address] [place] [address] Phone: [phone] Fax: [phone]  [place], [place] [address] Phone: [phone] Fax: [phone]  Readmission Risk Interventions Readmission Risk Prevention Plan 03/20/2020  Transportation Screening Complete  HRI or Home Care Consult Complete  Social Work Consult for [place] Planning/Counseling Complete  Palliative Care Screening Not Applicable  Medication Review Press photographer) Complete  Some recent data might be hidden

## 2020-03-21 ENCOUNTER — Ambulatory Visit: Payer: Medicare Other

## 2020-03-21 ENCOUNTER — Inpatient Hospital Stay (HOSPITAL_COMMUNITY): Payer: Medicare Other

## 2020-03-21 DIAGNOSIS — I4891 Unspecified atrial fibrillation: Secondary | ICD-10-CM | POA: Diagnosis not present

## 2020-03-21 LAB — BASIC METABOLIC PANEL
Anion gap: 6 (ref 5–15)
BUN: 44 mg/dL — ABNORMAL HIGH (ref 8–23)
CO2: 29 mmol/L (ref 22–32)
Calcium: 8.6 mg/dL — ABNORMAL LOW (ref 8.9–10.3)
Chloride: 98 mmol/L (ref 98–111)
Creatinine, Ser: 1.69 mg/dL — ABNORMAL HIGH (ref 0.61–1.24)
GFR, Estimated: 42 mL/min — ABNORMAL LOW (ref >60)
Glucose, Bld: 135 mg/dL — ABNORMAL HIGH (ref 70–99)
Potassium: 4.2 mmol/L (ref 3.5–5.1)
Sodium: 133 mmol/L — ABNORMAL LOW (ref 135–145)

## 2020-03-21 LAB — CBC
HCT: 30.2 % — ABNORMAL LOW (ref 39.0–52.0)
Hemoglobin: 9.1 g/dL — ABNORMAL LOW (ref 13.0–17.0)
MCH: 25.6 pg — ABNORMAL LOW (ref 26.0–34.0)
MCHC: 30.1 g/dL (ref 30.0–36.0)
MCV: 85.1 fL (ref 80.0–100.0)
Platelets: 268 10*3/uL (ref 150–400)
RBC: 3.55 MIL/uL — ABNORMAL LOW (ref 4.22–5.81)
RDW: 16.7 % — ABNORMAL HIGH (ref 11.5–15.5)
WBC: 6.3 10*3/uL (ref 4.0–10.5)
nRBC: 0 % (ref 0.0–0.2)

## 2020-03-21 LAB — GLUCOSE, CAPILLARY
Glucose-Capillary: 141 mg/dL — ABNORMAL HIGH (ref 70–99)
Glucose-Capillary: 224 mg/dL — ABNORMAL HIGH (ref 70–99)
Glucose-Capillary: 189 mg/dL — ABNORMAL HIGH (ref 70–99)
Glucose-Capillary: 154 mg/dL — ABNORMAL HIGH (ref 70–99)

## 2020-03-21 LAB — MAGNESIUM: Magnesium: 1.9 mg/dL (ref 1.7–2.4)

## 2020-03-21 LAB — BRAIN NATRIURETIC PEPTIDE: B Natriuretic Peptide: 1301 pg/mL — ABNORMAL HIGH (ref 0.0–100.0)

## 2020-03-21 MED ORDER — INSULIN ASPART 100 UNIT/ML ~~LOC~~ SOLN
3.0000 [IU] | Freq: Three times a day (TID) | SUBCUTANEOUS | Status: DC
  Administered 2020-03-21 – 2020-03-23 (×7): 3 [IU] via SUBCUTANEOUS

## 2020-03-21 MED ORDER — INSULIN ASPART 100 UNIT/ML ~~LOC~~ SOLN: 0.0000 [IU] | Freq: Every day | SUBCUTANEOUS | Status: DC

## 2020-03-21 MED ORDER — ALUM & MAG HYDROXIDE-SIMETH 200-200-20 MG/5ML PO SUSP: 30.0000 mL | ORAL | Status: DC | PRN

## 2020-03-21 MED ORDER — INSULIN ASPART 100 UNIT/ML ~~LOC~~ SOLN
0.0000 [IU] | Freq: Three times a day (TID) | SUBCUTANEOUS | Status: DC
  Administered 2020-03-21 – 2020-03-22 (×3): 3 [IU] via SUBCUTANEOUS
  Administered 2020-03-22: 2 [IU] via SUBCUTANEOUS
  Administered 2020-03-23: 5 [IU] via SUBCUTANEOUS
  Administered 2020-03-23: 3 [IU] via SUBCUTANEOUS

## 2020-03-21 NOTE — Progress Notes (Signed)
Patient Demographics:    Richard [name], is a 75 y.o. male, DOB - [birth year], HV:[id number]  Admit date - 03/19/2020   Admitting Physician Ginamarie Banfield Denton Brick, MD  Outpatient Primary MD for the patient is [name], FNP  LOS - 2   No chief complaint on file.       Subjective:    Richard [name] today has no fevers, no emesis,  No chest pain,  -Patient is a very sleepy, patient daughter who is a respiratory therapist Abigail Butts is at bedside, questions answered --Dyspnea on exertion persist  Assessment  & Plan :    Principal Problem:   Atrial fibrillation with RVR ([place]) Active Problems:   Type 2 diabetes mellitus with complication, with long-term current use of insulin ([place])   Coronary artery disease involving native coronary artery of native heart with unstable angina pectoris (HCC)   Atrial flutter (HCC) - s/p ablation   Bladder mass   CKD (chronic kidney disease) stage 3, GFR 30-59 ml/min (HCC)  Brief Summary- 75 y.o. male with past medical history of CAD (s/p DES to RCA in 2007, DES to mid-LAD and DES to OM2 in 09/2017), chronic combined systolic and diastolic CHF (EF [id number] in 12/2019, at 30-35% by repeat echo in 01/2020), paroxysmal atrial flutter (s/p ablation in 2008), HTN, HLD, Type 2 DM, prior TIA, bladder cancer (s/p TURBT in 09/2019 and currently undergoing radiation) and former tobacco use (quit in 06/2017)  admitted on 03/19/2020 directly from cardiology clinic due to paroxysmal A. fib with RVR as well as worsening CHF exacerbation after failing adjustments of his oral diuretic regimen  A/p 1)Paroxysmal A. Fib/Flutter with RVR/MAT--- -previous ablation 2008 -Patient also had some MAT noted this admission -Narrow complex irregular tach-- -Cardiology consult appreciated -Continue oral amiodarone load currently at 400 mg twice daily, -Continue Toprol-XL at 100 mg daily --Low EF  precludes Cardizem use -CHADSVASC=6. Aim for rate control.  Not a candidate for cardioversion or anticoagulation due to high bleeding risk in the setting of hematuria from bladder malignancy  2)Acute on chronic systolic and diastolic CHF LVEF 30 to [id number] on echo 01/2020 --Failed oral diuretics including increased dose of Demadex at home (PTA) monitor daily weight and fluid input and output avoid ACEI/ARB/ARNI due to renal concerns -Continue to hold Entresto =-May be a candidate for isosorbide/hydralazine combo -Fluid balance is negative -Dyspnea on exertion persist -Creatinine trending up, will give IV Lasix x1 today and reevaluate renal function and fluid status in the a.m. prior to redosing Lasix  - 3)CAD with prior stents---   s/p DES to RCA in 2007 with DES to mid-LAD and DES to OM2 in 09/2017 -No ACS type symptoms at this time continue medical management, --- poor candidate for PCI since he cannot take DAPT.  - Continue isosorbide, Lipitor and metoprolol  4)Bladder CA with hematuria status post resection and clot evacuation/ s/p TURBT in 09/2019 ---  currently undergoing radiation--- Dr. Delton Coombes aware of admission to the hospital  5)  CKD stage -3A   -creatinine is up to 1.69 from about 1.4  -Diuretics adjusted due to rising creatinine -Monitor closely while diuresing renally adjust medications, avoid nephrotoxic agents / dehydration  / hypotension  6) chronic anemia--- suspect multifactorial anemia in  the setting of CKD and ongoing hematuria recent anemia work-up consistent with iron deficiency anemia with serum iron of 31 and iron saturation of 8 and ferritin of 22 -B12 and folate were normal -Continue iron infusion last infusion of iron is 03/20/2020  7)DM2- A1c is 9.6 reflecting uncontrolled diabetes with hyperglycemia PTA  -use Novolog/Humalog Sliding scale insulin with Accu-Cheks/Fingersticks as ordered  -Hold Metformin and glipizide  Disposition/Need for  in-Hospital Stay- patient unable to be discharged at this time due to -A. fib with RVR requiring further rate control, CHF exacerbation failed outpatient oral diuretics needs IV diuresis*  Status is: Inpatient  Remains inpatient appropriate because:Please see above   Dispo: The patient is from: Home  Anticipated d/c is to: Home  Anticipated d/c date is: 2 days  Patient currently is not medically stable to d/c. Barriers: Not Clinically Stable  Code Status : -  Code Status: Full Code   Family Communication:   (patient is alert, awake and coherent)  Discussed with wife  Consults  :  card  DVT Prophylaxis  :   - SCDs  SCDs Start: 03/19/20 1300 Place TED hose Start: 03/19/20 1300 --- high bleeding risk    Lab Results  Component Value Date   PLT 268 03/21/2020    Inpatient Medications  Scheduled Meds: . amiodarone  400 mg Oral BID  . atorvastatin  10 mg Oral Daily  . Chlorhexidine Gluconate Cloth  6 each Topical Daily  . insulin aspart  0-15 Units Subcutaneous TID WC  . insulin aspart  0-5 Units Subcutaneous QHS  . insulin aspart  3 Units Subcutaneous TID WC  . isosorbide mononitrate  30 mg Oral QHS  . metoprolol succinate  100 mg Oral Daily  . sodium chloride flush  3 mL Intravenous Q12H  . sodium chloride flush  3 mL Intravenous Q12H   Continuous Infusions: . sodium chloride     PRN Meds:.sodium chloride, acetaminophen **OR** acetaminophen, ALPRAZolam, bisacodyl, ondansetron **OR** ondansetron (ZOFRAN) IV, oxyCODONE, polyethylene glycol, sodium chloride flush, traZODone   Anti-infectives (From admission, onward)   None        Objective:   Vitals:   03/21/20 1146 03/21/20 1200 03/21/20 1300 03/21/20 1400  BP:  116/68 131/63 (!) 113/57  Pulse:  64 73 69  Resp:  18 (!) 22 16  Temp: (!) 97.3 F (36.3 C)     TempSrc: Axillary     SpO2:  98% 93% 99%  Weight:      Height:        Wt Readings from Last 3 Encounters:   03/21/20 82.1 kg  03/19/20 81.6 kg  03/17/20 81.9 kg     Intake/Output Summary (Last 24 hours) at 03/21/2020 1507 Last data filed at 03/21/2020 0630 Gross per 24 hour  Intake 117 ml  Output 725 ml  Net -608 ml    Physical Exam  Gen:- Awake Alert, no acute distress HEENT:- [place].AT, No sclera icterus Neck-Supple Neck, .  Lungs-improving air movement, no wheezing CV- S1, S2 normal, irregularly irregular and tachycardic  abd-  +ve B.Sounds, Abd Soft, No tenderness,    Extremity/Skin:- 2+ edema, pedal pulses present  Psych-affect is appropriate, oriented x3 Neuro-generalized weakness, no new focal deficits, no tremors   Data Review:   Micro Results Recent Results (from the past 240 hour(s))  SARS CORONAVIRUS 2 (TAT 6-24 HRS) Nasopharyngeal Nasopharyngeal Swab     Status: None   Collection Time: 03/19/20  5:07 PM   Specimen: Nasopharyngeal Swab  Result  Value Ref Range Status   SARS Coronavirus 2 NEGATIVE NEGATIVE Final    Comment: (NOTE) SARS-CoV-2 target nucleic acids are NOT DETECTED.  The SARS-CoV-2 RNA is generally detectable in upper and lower respiratory specimens during the acute phase of infection. Negative results do not preclude SARS-CoV-2 infection, do not rule out co-infections with other pathogens, and should not be used as the sole basis for treatment or other patient management decisions. Negative results must be combined with clinical observations, patient history, and epidemiological information. The expected result is Negative.  Fact Sheet for Patients: SugarRoll.be  Fact Sheet for Healthcare Providers: https://www.woods-mathews.com/  This test is not yet approved or cleared by the [place] FDA and  has been authorized for detection and/or diagnosis of SARS-CoV-2 by FDA under an Emergency Use Authorization (EUA). This EUA will remain  in effect (meaning this test can be used) for the duration of  the COVID-19 declaration under Se ction 564(b)(1) of the Act, 21 U.S.C. section 360bbb-3(b)(1), unless the authorization is terminated or revoked sooner.  Performed at [facility], [place] [address], [place], [place] [address]     Radiology Reports DG Chest 2 View  Result Date: 03/21/2020 CLINICAL DATA:  Shortness of breath, lower extremity swelling, history diabetes mellitus, CHF, coronary artery disease, smoker, bladder cancer; COVID-19 negative on 03/19/2020 EXAM: CHEST - 2 VIEW COMPARISON:  01/25/2020 FINDINGS: Upper normal heart size. Slight pulmonary vascular congestion. Atherosclerotic calcification aorta. Increased interstitial markings throughout both lung since previous exam, question pulmonary edema, though atypical infection not completely excluded. Tiny bibasilar effusions. No pneumothorax. Bones demineralized with BILATERAL glenohumeral degenerative changes and evidence of prior cervical spine fusion. IMPRESSION: Diffuse interstitial infiltrates bilaterally favor pulmonary edema as above. Tiny bibasilar pleural effusions. Electronically Signed   By: Lavonia Dana M.D.   On: 03/21/2020 08:27     CBC Recent Labs  Lab 03/17/20 1636 03/20/20 0508 03/21/20 0442  WBC 7.0 6.6 6.3  HGB 10.2* 9.1* 9.1*  HCT 33.4* 29.7* 30.2*  PLT 284 268 268  MCV 84.8 84.6 85.1  MCH 25.9* 25.9* 25.6*  MCHC 30.5 30.6 30.1  RDW 16.5* 16.8* 16.7*  LYMPHSABS 0.5*  --   --   MONOABS 0.5  --   --   EOSABS 0.1  --   --   BASOSABS 0.1  --   --     Chemistries  Recent Labs  Lab 03/17/20 1636 03/20/20 0508 03/21/20 0442  NA 134* 131* 133*  K 3.7 3.6 4.2  CL 95* 96* 98  CO2 29 26 29   GLUCOSE 233* 250* 135*  BUN 46* 41* 44*  CREATININE 1.46* 1.38* 1.69*  CALCIUM 9.0 8.4* 8.6*  MG  --  1.6* 1.9  AST 20  --   --   ALT 21  --   --   ALKPHOS 176*  --   --   BILITOT 0.5  --   --     ------------------------------------------------------------------------------------------------------------------ No results for input(s): CHOL, HDL, LDLCALC, TRIG, CHOLHDL, LDLDIRECT in the last 72 hours.  Lab Results  Component Value Date   HGBA1C 9.6 (H) 01/23/2020   ------------------------------------------------------------------------------------------------------------------ No results for input(s): TSH, T4TOTAL, T3FREE, THYROIDAB in the last 72 hours.  Invalid input(s): FREET3 ------------------------------------------------------------------------------------------------------------------ No results for input(s): VITAMINB12, FOLATE, FERRITIN, TIBC, IRON, RETICCTPCT in the last 72 hours.  Coagulation profile No results for input(s): INR, PROTIME in the last 168 hours.  No results for input(s): DDIMER in the last 72 hours.  Cardiac Enzymes No  results for input(s): CKMB, TROPONINI, MYOGLOBIN in the last 168 hours.  Invalid input(s): CK ------------------------------------------------------------------------------------------------------------------    Component Value Date/Time   BNP 1,301.0 (H) 03/21/2020 6811     Roxan Hockey M.D on 03/21/2020 at 3:07 PM  Go to [email or web address] - for contact info  [facility] - Office  [phone]

## 2020-03-21 NOTE — Progress Notes (Addendum)
Progress Note  Patient Name: Richard [name] Date of Encounter: 03/21/2020  [facility] Cardiologist: Rozann Lesches, MD   Subjective   No complaints  Inpatient Medications    Scheduled Meds: . amiodarone  400 mg Oral BID  . atorvastatin  10 mg Oral Daily  . Chlorhexidine Gluconate Cloth  6 each Topical Daily  . furosemide  40 mg Intravenous BID  . insulin aspart  0-15 Units Subcutaneous TID WC  . insulin aspart  0-5 Units Subcutaneous QHS  . isosorbide mononitrate  30 mg Oral QHS  . metoprolol succinate  100 mg Oral Daily  . sodium chloride flush  3 mL Intravenous Q12H  . sodium chloride flush  3 mL Intravenous Q12H   Continuous Infusions: . sodium chloride     PRN Meds: sodium chloride, acetaminophen **OR** acetaminophen, ALPRAZolam, bisacodyl, ondansetron **OR** ondansetron (ZOFRAN) IV, oxyCODONE, polyethylene glycol, sodium chloride flush, traZODone   Vital Signs    Vitals:   03/21/20 0525 03/21/20 0600 03/21/20 0700 03/21/20 0723  BP:  (!) 141/76 124/80   Pulse: 72 72 72   Resp:   16   Temp:    98 F (36.7 C)  TempSrc:    Oral  SpO2: 100% 100% 100%   Weight:      Height:        Intake/Output Summary (Last 24 hours) at 03/21/2020 0841 Last data filed at 03/21/2020 0630 Gross per 24 hour  Intake 117 ml  Output 1075 ml  Net -958 ml   Last 3 Weights 03/21/2020 03/20/2020 03/19/2020  Weight (lbs) 181 lb 180 lb 1.9 oz 179 lb 12.8 oz  Weight (kg) 82.1 kg 81.7 kg 81.557 kg      Telemetry    SR, PAF, MAT/wandering atrial pacemaker- Personally Reviewed  ECG    n/a - Personally Reviewed  Physical Exam   GEN: No acute distress.   Neck: No JVD Cardiac: RRR, no murmurs, rubs, or gallops.  Respiratory: mild crackles bilaterally GI: Soft, nontender, non-distended  MS: 1+ bilateral LE edema; No deformity. Neuro:  Nonfocal  Psych: Normal affect   Labs    High Sensitivity Troponin:  No results for input(s): TROPONINIHS in the last 720 hours.     Chemistry Recent Labs  Lab 03/17/20 1636 03/20/20 0508 03/21/20 0442  NA 134* 131* 133*  K 3.7 3.6 4.2  CL 95* 96* 98  CO2 29 26 29   GLUCOSE 233* 250* 135*  BUN 46* 41* 44*  CREATININE 1.46* 1.38* 1.69*  CALCIUM 9.0 8.4* 8.6*  PROT 7.3  --   --   ALBUMIN 3.3*  --   --   AST 20  --   --   ALT 21  --   --   ALKPHOS 176*  --   --   BILITOT 0.5  --   --   GFRNONAA 50* 54* 42*  ANIONGAP 10 9 6      Hematology Recent Labs  Lab 03/17/20 1636 03/20/20 0508 03/21/20 0442  WBC 7.0 6.6 6.3  RBC 3.94* 3.51* 3.55*  HGB 10.2* 9.1* 9.1*  HCT 33.4* 29.7* 30.2*  MCV 84.8 84.6 85.1  MCH 25.9* 25.9* 25.6*  MCHC 30.5 30.6 30.1  RDW 16.5* 16.8* 16.7*  PLT 284 268 268    BNP Recent Labs  Lab 03/21/20 0442  BNP 1,301.0*     DDimer No results for input(s): DDIMER in the last 168 hours.   Radiology    DG Chest 2 View  Result Date:  03/21/2020 CLINICAL DATA:  Shortness of breath, lower extremity swelling, history diabetes mellitus, CHF, coronary artery disease, smoker, bladder cancer; COVID-19 negative on 03/19/2020 EXAM: CHEST - 2 VIEW COMPARISON:  01/25/2020 FINDINGS: Upper normal heart size. Slight pulmonary vascular congestion. Atherosclerotic calcification aorta. Increased interstitial markings throughout both lung since previous exam, question pulmonary edema, though atypical infection not completely excluded. Tiny bibasilar effusions. No pneumothorax. Bones demineralized with BILATERAL glenohumeral degenerative changes and evidence of prior cervical spine fusion. IMPRESSION: Diffuse interstitial infiltrates bilaterally favor pulmonary edema as above. Tiny bibasilar pleural effusions. Electronically Signed   By: Lavonia Dana M.D.   On: 03/21/2020 08:27    Cardiac Studies     Patient Profile     Richard [name] is a 75 y.o. male with past medical history of CAD (s/p DES to RCA in 2007, DES to mid-LAD and DES to OM2 in 09/2017), chronic combined systolic and diastolic CHF (EF  97-98% in 12/2019, at 30-35% by repeat echo in 01/2020), paroxysmal atrial flutter (s/p ablation in 2008), HTN, HLD, Type 2 DM, prior TIA, bladder cancer (s/p TURBT in 09/2019 and currently undergoing radiation) and former tobacco use (quit in 06/2017) who is being seen today for the evaluation of atrial fibrillation with RVR at the request of Dr. Denton Brick.   Assessment & Plan    1. Acute on chronic combined systolic/diastolic HF - 92/1194 echo LVE 30-35%, grade I dd - He is on IV lasix 40mg  bid. Neg 1.7 L yesterday and 2.9 L since admission. Some uptrend in Cr will dose IV lasix 40mg  x 1 today. Reassess volume status and renal function tomorrow prior to redosing.  -Continue toprol 100. No ACE/ARB/ARNI due to history of renal dysfunction  2. Atrial arrhythmias On tele PAF, MAT at times - currently SR, overall well rate controlled even with short paroxysms of afib - started on oral amio 400mg  bid on admit, remains on toprol 100 - continue current therapy, would plan 1 week amion 400mg  bid, then 2 weeks 200mg  bid, then 200mg  daily  - cannot anticoag due to history of bladder CA and hematuria, anemia  3. CAD - He is s/p DES to RCA in 2007 with DES to mid-LAD and DES to OM2 in 09/2017. He denies any recent chest pain.  - No plans for ischemic testing at this time as he is not a candidate for DAPT. ASA was discontinued during his last admission secondary to hematuria and anemia. Remains on Atorvastatin 10mg  daily, Imdur 30mg  daily and Toprol-XL 100mg  daily.   From cardiac standpoint would be ok for tele bed  For questions or updates, please contact [place] Please consult [email or web address] for contact info under        Signed, Carlyle Dolly, MD  03/21/2020, 8:41 AM

## 2020-03-21 NOTE — Progress Notes (Signed)
Inpatient Diabetes Program Recommendations  AACE/ADA: New Consensus Statement on Inpatient Glycemic Control (2015)  Target Ranges:  Prepandial:   less than 140 mg/dL      Peak postprandial:   less than 180 mg/dL (1-2 hours)      Critically ill patients:  140 - 180 mg/dL   Lab Results  Component Value Date   GLUCAP 224 (H) 03/21/2020   HGBA1C 9.6 (H) 01/23/2020    Review of Glycemic Control Results for Richard [name], Richard [name] (MRN [id number]) as of 03/21/2020 13:26  Ref. Range 03/20/2020 17:28 03/20/2020 21:25 03/21/2020 07:26 03/21/2020 11:11  Glucose-Capillary Latest Ref Range: 70 - 99 mg/dL 232 (H) 311 (H) 141 (H) 224 (H)   Diabetes history: Type 2 DM Outpatient Diabetes medications: Glipizide 5 mg BID, Metformin 1000 mg BID Current orders for Inpatient glycemic control: Novolog 0-15 units TID, Novolog 0-5 units QHS  Inpatient Diabetes Program Recommendations:    If post prandials continue to exceed inpatient goals, could consider adding Novolog 3 units TID (assuming patient is consuming >50% of meals).  Thanks, Bronson Curb, MSN, RNC-OB Diabetes Coordinator [phone] (8a-5p)

## 2020-03-21 NOTE — Care Management Important Message (Signed)
Important Message  Patient Details  Name: Richard [name] MRN: [id number] Date of Birth: 03-05-1945   Medicare Important Message Given:  Yes     Tommy Medal 03/21/2020, 3:56 PM

## 2020-03-22 LAB — BASIC METABOLIC PANEL
Anion gap: 11 (ref 5–15)
BUN: 55 mg/dL — ABNORMAL HIGH (ref 8–23)
CO2: 25 mmol/L (ref 22–32)
Calcium: 9 mg/dL (ref 8.9–10.3)
Chloride: 95 mmol/L — ABNORMAL LOW (ref 98–111)
Creatinine, Ser: 2.09 mg/dL — ABNORMAL HIGH (ref 0.61–1.24)
GFR, Estimated: 33 mL/min — ABNORMAL LOW (ref >60)
Glucose, Bld: 147 mg/dL — ABNORMAL HIGH (ref 70–99)
Potassium: 4.3 mmol/L (ref 3.5–5.1)
Sodium: 131 mmol/L — ABNORMAL LOW (ref 135–145)

## 2020-03-22 LAB — GLUCOSE, CAPILLARY
Glucose-Capillary: 145 mg/dL — ABNORMAL HIGH (ref 70–99)
Glucose-Capillary: 160 mg/dL — ABNORMAL HIGH (ref 70–99)
Glucose-Capillary: 152 mg/dL — ABNORMAL HIGH (ref 70–99)
Glucose-Capillary: 64 mg/dL — ABNORMAL LOW (ref 70–99)
Glucose-Capillary: 66 mg/dL — ABNORMAL LOW (ref 70–99)
Glucose-Capillary: 78 mg/dL (ref 70–99)

## 2020-03-22 NOTE — Progress Notes (Signed)
CBG found to be low. Patient had no symptoms otherwise. Patient was offered and accepted PO intake. Patient was re-checked and still found to be low, he was then rechecked after completion of PO intake and found to be within acceptable range. Patient will be rechecked.

## 2020-03-22 NOTE — Progress Notes (Signed)
Patient Demographics:    Richard [name], is a 75 y.o. male, DOB - July 25, 1945, CZY:[id number]  Admit date - 03/19/2020   Admitting Physician [name], MD  Outpatient Primary MD for the patient is [name], FNP  LOS - 3   No chief complaint on file.       Subjective:    [name] today has no fevers, no emesis,  No chest pain,  Pt states he voided 3 times overnight (7pm to 7 am) , but urine output documentation is incomplete -Ambulated from bed to chair with improved DOE, no dizziness or palpitations -RN Lawrence at bedside  Assessment  & Plan :    Principal Problem:   Atrial fibrillation with RVR ([name]) Active Problems:   Type 2 diabetes mellitus with complication, with long-term current use of insulin (HCC)   Coronary artery disease involving native coronary artery of native heart with unstable angina pectoris (HCC)   Atrial flutter (HCC) - s/p ablation   Bladder mass   CKD (chronic kidney disease) stage 3, GFR 30-59 ml/min (HCC)  Brief Summary- 75 y.o. male with past medical history of CAD (s/p DES to RCA in 2007, DES to mid-LAD and DES to OM2 in 09/2017), chronic combined systolic and diastolic CHF (EF 09-32% in 12/2019, at 30-35% by repeat echo in 01/2020), paroxysmal atrial flutter (s/p ablation in 2008), HTN, HLD, Type 2 DM, prior TIA, bladder cancer (s/p TURBT in 09/2019 and currently undergoing radiation) and former tobacco use (quit in 06/2017)  admitted on 03/19/2020 directly from cardiology clinic due to paroxysmal A. fib with RVR as well as worsening CHF exacerbation after failing adjustments of his oral diuretic regimen   A/p 1)Paroxysmal A. Fib/Flutter with RVR/MAT--- -previous ablation 2008 -Patient also had some MAT noted this admission -Narrow complex irregular tach-- -Cardiology consult appreciated -Continue oral amiodarone load currently at 400 mg twice  daily, -Continue Toprol-XL at 100 mg daily --Low EF precludes Cardizem use -CHADSVASC=6. Aim for rate control.  Not a candidate for cardioversion or anticoagulation due to high bleeding risk in the setting of hematuria from bladder malignancy -Rate control improving on above regimen of Toprol and amiodarone  2)Acute on chronic systolic and diastolic CHF LVEF 30 to 35% on echo 01/2020 --Failed oral diuretics including increased dose of Demadex at home (PTA) monitor daily weight and fluid input and output avoid ACEI/ARB/ARNI due to renal concerns -Continue to hold Entresto =-May be a candidate for isosorbide/hydralazine combo -Fluid balance is negative -Dyspnea on exertion improved -Creatinine trending up (creatinine is now 2.0 was 1.4 couple days ago and 1.69 yesterday)-- will give diuretic holiday on 03/22/2020 and reevaluate renal function and fluid status in the a.m. prior to redosing Lasix  - 3)CAD with prior stents---   s/p DES to RCA in 2007 with DES to mid-LAD and DES to OM2 in 09/2017 -No ACS type symptoms at this time continue medical management, --- poor candidate for PCI since he cannot take DAPT.  - Continue isosorbide, Lipitor and metoprolol  4)Bladder CA with hematuria status post resection and clot evacuation/ s/p TURBT in 09/2019 ---  currently undergoing radiation--- Dr. Delton Coombes aware of admission to the hospital  5)  CKD stage -3A   -creatinine trending up,  please see #2 above -Diuretics adjusted due to rising creatinine -Monitor closely while diuresing renally adjust medications, avoid nephrotoxic agents / dehydration  / hypotension  6) chronic anemia--- suspect multifactorial anemia in the setting of CKD and ongoing hematuria recent anemia work-up consistent with iron deficiency anemia with serum iron of 31 and iron saturation of 8 and ferritin of 22 -B12 and folate were normal -Continue iron infusion last infusion of iron is 03/20/2020  7)DM2- A1c is  9.6 reflecting uncontrolled diabetes with hyperglycemia PTA  -use Novolog/Humalog Sliding scale insulin with Accu-Cheks/Fingersticks as ordered  -Hold Metformin and glipizide  Disposition/Need for in-Hospital Stay- patient unable to be discharged at this time due to -A. fib with RVR requiring further rate control, CHF exacerbation failed outpatient oral diuretics needs IV diuresis, renal function requiring further monitoring  Status is: Inpatient  Remains inpatient appropriate because:Please see above   Dispo: The patient is from: Home  Anticipated d/c is to: Home  Anticipated d/c date is: 1 to 2 days  Patient currently is not medically stable to d/c. Barriers: Not Clinically Stable  Code Status : -  Code Status: Full Code   Family Communication:   (patient is alert, awake and coherent)  Discussed with daughter Abigail Butts  Consults  :  cardiology  DVT Prophylaxis  :   - SCDs  SCDs Start: 03/19/20 1300 Place TED hose Start: 03/19/20 1300 --- high bleeding risk    Lab Results  Component Value Date   PLT 268 03/21/2020    Inpatient Medications  Scheduled Meds: . amiodarone  400 mg Oral BID  . atorvastatin  10 mg Oral Daily  . Chlorhexidine Gluconate Cloth  6 each Topical Daily  . insulin aspart  0-15 Units Subcutaneous TID WC  . insulin aspart  0-5 Units Subcutaneous QHS  . insulin aspart  3 Units Subcutaneous TID WC  . isosorbide mononitrate  30 mg Oral QHS  . metoprolol succinate  100 mg Oral Daily  . sodium chloride flush  3 mL Intravenous Q12H  . sodium chloride flush  3 mL Intravenous Q12H   Continuous Infusions: . sodium chloride     PRN Meds:.sodium chloride, acetaminophen **OR** acetaminophen, ALPRAZolam, alum & mag hydroxide-simeth, bisacodyl, ondansetron **OR** ondansetron (ZOFRAN) IV, oxyCODONE, polyethylene glycol, sodium chloride flush, traZODone   Anti-infectives (From admission, onward)   None        Objective:    Vitals:   03/22/20 0500 03/22/20 0600 03/22/20 0832 03/22/20 1137  BP: 134/72     Pulse:  77 76 71  Resp: 14 19 16 18   Temp:   97.6 F (36.4 C) (!) 97.5 F (36.4 C)  TempSrc:   Oral Oral  SpO2:  98% 100% 95%  Weight:      Height:        Wt Readings from Last 3 Encounters:  03/21/20 82.1 kg  03/19/20 81.6 kg  03/17/20 81.9 kg     Intake/Output Summary (Last 24 hours) at 03/22/2020 1321 Last data filed at 03/22/2020 1137 Gross per 24 hour  Intake 560 ml  Output 725 ml  Net -165 ml    Physical Exam  Gen:- Awake Alert, no acute distress HEENT:- [place].AT, No sclera icterus Neck-Supple Neck, .  Lungs-improving air movement, no wheezing CV- S1, S2 normal, irregular abd-  +ve B.Sounds, Abd Soft, No tenderness,    Extremity/Skin:- 2+ edema, pedal pulses present  Psych-affect is appropriate, oriented x3 Neuro-generalized weakness, no new focal deficits, no tremors   Data Review:  Micro Results Recent Results (from the past 240 hour(s))  SARS CORONAVIRUS 2 (TAT 6-24 HRS) Nasopharyngeal Nasopharyngeal Swab     Status: None   Collection Time: 03/19/20  5:07 PM   Specimen: Nasopharyngeal Swab  Result Value Ref Range Status   SARS Coronavirus 2 NEGATIVE NEGATIVE Final    Comment: (NOTE) SARS-CoV-2 target nucleic acids are NOT DETECTED.  The SARS-CoV-2 RNA is generally detectable in upper and lower respiratory specimens during the acute phase of infection. Negative results do not preclude SARS-CoV-2 infection, do not rule out co-infections with other pathogens, and should not be used as the sole basis for treatment or other patient management decisions. Negative results must be combined with clinical observations, patient history, and epidemiological information. The expected result is Negative.  Fact Sheet for Patients: SugarRoll.be  Fact Sheet for Healthcare Providers: https://www.woods-mathews.com/  This test is not yet  approved or cleared by the [place] FDA and  has been authorized for detection and/or diagnosis of SARS-CoV-2 by FDA under an Emergency Use Authorization (EUA). This EUA will remain  in effect (meaning this test can be used) for the duration of the COVID-19 declaration under Se ction 564(b)(1) of the Act, 21 U.S.C. section 360bbb-3(b)(1), unless the authorization is terminated or revoked sooner.  Performed at [facility], [place] [address], [place], [place] [address]     Radiology Reports DG Chest 2 View  Result Date: 03/21/2020 CLINICAL DATA:  Shortness of breath, lower extremity swelling, history diabetes mellitus, CHF, coronary artery disease, smoker, bladder cancer; COVID-19 negative on 03/19/2020 EXAM: CHEST - 2 VIEW COMPARISON:  01/25/2020 FINDINGS: Upper normal heart size. Slight pulmonary vascular congestion. Atherosclerotic calcification aorta. Increased interstitial markings throughout both lung since previous exam, question pulmonary edema, though atypical infection not completely excluded. Tiny bibasilar effusions. No pneumothorax. Bones demineralized with BILATERAL glenohumeral degenerative changes and evidence of prior cervical spine fusion. IMPRESSION: Diffuse interstitial infiltrates bilaterally favor pulmonary edema as above. Tiny bibasilar pleural effusions. Electronically Signed   By: Lavonia Dana M.D.   On: 03/21/2020 08:27     CBC Recent Labs  Lab 03/17/20 1636 03/20/20 0508 03/21/20 0442  WBC 7.0 6.6 6.3  HGB 10.2* 9.1* 9.1*  HCT 33.4* 29.7* 30.2*  PLT 284 268 268  MCV 84.8 84.6 85.1  MCH 25.9* 25.9* 25.6*  MCHC 30.5 30.6 30.1  RDW 16.5* 16.8* 16.7*  LYMPHSABS 0.5*  --   --   MONOABS 0.5  --   --   EOSABS 0.1  --   --   BASOSABS 0.1  --   --     Chemistries  Recent Labs  Lab 03/17/20 1636 03/20/20 0508 03/21/20 0442 03/22/20 0601  NA 134* 131* 133* 131*  K 3.7 3.6 4.2 4.3  CL 95* 96* 98 95*  CO2 29 26 29 25   GLUCOSE 233* 250* 135* 147*   BUN 46* 41* 44* 55*  CREATININE 1.46* 1.38* 1.69* 2.09*  CALCIUM 9.0 8.4* 8.6* 9.0  MG  --  1.6* 1.9  --   AST 20  --   --   --   ALT 21  --   --   --   ALKPHOS 176*  --   --   --   BILITOT 0.5  --   --   --    ------------------------------------------------------------------------------------------------------------------ No results for input(s): CHOL, HDL, LDLCALC, TRIG, CHOLHDL, LDLDIRECT in the last 72 hours.  Lab Results  Component Value Date   HGBA1C 9.6 (H) 01/23/2020   ------------------------------------------------------------------------------------------------------------------  No results for input(s): TSH, T4TOTAL, T3FREE, THYROIDAB in the last 72 hours.  Invalid input(s): FREET3 ------------------------------------------------------------------------------------------------------------------ No results for input(s): VITAMINB12, FOLATE, FERRITIN, TIBC, IRON, RETICCTPCT in the last 72 hours.  Coagulation profile No results for input(s): INR, PROTIME in the last 168 hours.  No results for input(s): DDIMER in the last 72 hours.  Cardiac Enzymes No results for input(s): CKMB, TROPONINI, MYOGLOBIN in the last 168 hours.  Invalid input(s): CK ------------------------------------------------------------------------------------------------------------------    Component Value Date/Time   BNP 1,301.0 (H) 03/21/2020 [id number]    Roxan Hockey M.D on 03/22/2020 at 1:21 PM  Go to [email or web address] - for contact info  [facility] - Office  [phone]

## 2020-03-23 LAB — CBC
HCT: 31.3 % — ABNORMAL LOW (ref 39.0–52.0)
Hemoglobin: 9.7 g/dL — ABNORMAL LOW (ref 13.0–17.0)
MCH: 26.1 pg (ref 26.0–34.0)
MCHC: 31 g/dL (ref 30.0–36.0)
MCV: 84.1 fL (ref 80.0–100.0)
Platelets: 289 10*3/uL (ref 150–400)
RBC: 3.72 MIL/uL — ABNORMAL LOW (ref 4.22–5.81)
RDW: 17.2 % — ABNORMAL HIGH (ref 11.5–15.5)
WBC: 6.8 10*3/uL (ref 4.0–10.5)
nRBC: 0 % (ref 0.0–0.2)

## 2020-03-23 LAB — BASIC METABOLIC PANEL
Anion gap: 9 (ref 5–15)
Anion gap: 8 (ref 5–15)
BUN: 58 mg/dL — ABNORMAL HIGH (ref 8–23)
BUN: 55 mg/dL — ABNORMAL HIGH (ref 8–23)
CO2: 27 mmol/L (ref 22–32)
CO2: 27 mmol/L (ref 22–32)
Calcium: 8.9 mg/dL (ref 8.9–10.3)
Calcium: 8.7 mg/dL — ABNORMAL LOW (ref 8.9–10.3)
Chloride: 95 mmol/L — ABNORMAL LOW (ref 98–111)
Chloride: 96 mmol/L — ABNORMAL LOW (ref 98–111)
Creatinine, Ser: 2 mg/dL — ABNORMAL HIGH (ref 0.61–1.24)
Creatinine, Ser: 1.84 mg/dL — ABNORMAL HIGH (ref 0.61–1.24)
GFR, Estimated: 34 mL/min — ABNORMAL LOW (ref >60)
GFR, Estimated: 38 mL/min — ABNORMAL LOW (ref >60)
Glucose, Bld: 117 mg/dL — ABNORMAL HIGH (ref 70–99)
Glucose, Bld: 159 mg/dL — ABNORMAL HIGH (ref 70–99)
Potassium: 4.4 mmol/L (ref 3.5–5.1)
Potassium: 3.9 mmol/L (ref 3.5–5.1)
Sodium: 131 mmol/L — ABNORMAL LOW (ref 135–145)
Sodium: 131 mmol/L — ABNORMAL LOW (ref 135–145)

## 2020-03-23 LAB — GLUCOSE, CAPILLARY
Glucose-Capillary: 95 mg/dL (ref 70–99)
Glucose-Capillary: 102 mg/dL — ABNORMAL HIGH (ref 70–99)
Glucose-Capillary: 229 mg/dL — ABNORMAL HIGH (ref 70–99)
Glucose-Capillary: 195 mg/dL — ABNORMAL HIGH (ref 70–99)

## 2020-03-23 MED ORDER — TORSEMIDE 20 MG PO TABS
20.0000 mg | ORAL_TABLET | Freq: Every day | ORAL | Status: DC
  Administered 2020-03-23: 20 mg via ORAL
  Filled 2020-03-23: qty 1

## 2020-03-23 MED ORDER — ACETAMINOPHEN 325 MG PO TABS: 650.0000 mg | ORAL_TABLET | Freq: Four times a day (QID) | ORAL | 0 refills | Status: AC | PRN

## 2020-03-23 MED ORDER — HYDRALAZINE HCL 25 MG PO TABS: 25.0000 mg | ORAL_TABLET | Freq: Two times a day (BID) | ORAL | 2 refills | Status: AC

## 2020-03-23 MED ORDER — AMIODARONE HCL 400 MG PO TABS: 400.0000 mg | ORAL_TABLET | Freq: Two times a day (BID) | ORAL | 0 refills | Status: DC

## 2020-03-23 NOTE — Discharge Instructions (Signed)
1)Very low-salt diet advised 2)Weigh yourself daily, call if you gain more than 3 pounds in 1 day or more than 5 pounds in 1 week as your diuretic medications may need to be adjusted 3)Limit your Fluid  intake to no more than 60 ounces (1.8 Liters) per day 4) Metformin/Glucophage has been discontinued due to kidney concerns 5) please Stop amlodipine as amlodipine can contribute to leg swelling and take hydralazine with isosorbide instead for your heart 6)Please keep a record/chart of your daily weight--and take this with you when you go to see your cardiologist -Please monitor your urine output and let your cardiologist know if you are not voiding enough 7)Okay to use teds/compression stockings as advised every morning-- take them off at bedtime 8)You need a repeat BMP (kidney and electrolyte) blood test in about  4 to 5 days

## 2020-03-23 NOTE — Progress Notes (Signed)
[facility] pharmacy called and stated that patients amiodarone 400 mg tablets twice a day but will pay for amiodarone 200 mg. Tablets taking two twice a day. I verbally gave permission for the change.

## 2020-03-23 NOTE — Discharge Summary (Signed)
Richard [name], is a 75 y.o. male  DOB [birth year]  MRN [id number].  Admission date:  03/19/2020  Admitting Physician  Roxan Hockey, MD  Discharge Date:  03/23/2020   Primary MD  Wannetta Sender, FNP  Recommendations for primary care physician for things to follow:   1)Very low-salt diet advised 2)Weigh yourself daily, call if you gain more than 3 pounds in 1 day or more than 5 pounds in 1 week as your diuretic medications may need to be adjusted 3)Limit your Fluid  intake to no more than 60 ounces (1.8 Liters) per day 4) Metformin/Glucophage has been discontinued due to kidney concerns 5) please Stop amlodipine as amlodipine can contribute to leg swelling and take hydralazine with isosorbide instead for your heart 6)Please keep a record/chart of your daily weight--and take this with you when you go to see your cardiologist -Please monitor your urine output and let your cardiologist know if you are not voiding enough 7)Okay to use teds/compression stockings as advised every morning-- take them off at bedtime 8)You need a repeat BMP (kidney and electrolyte) blood test in about  4 to 5 days  Admission Diagnosis  Atrial fibrillation with RVR ([place]) [I48.91]   Discharge Diagnosis  Atrial fibrillation with RVR ([place]) [I48.91]    Principal Problem:   Atrial fibrillation with RVR ([place]) Active Problems:   Type 2 diabetes mellitus with complication, with long-term current use of insulin (HCC)   Coronary artery disease involving native coronary artery of native heart with unstable angina pectoris (HCC)   Atrial flutter (HCC) - s/p ablation   Bladder mass   CKD (chronic kidney disease) stage 3, GFR 30-59 ml/min (HCC)      Past Medical History:  Diagnosis Date  . Anemia 12/2019  . Bladder cancer ([place])   . CAD (coronary artery disease)    a. 2007 - DES x 2 proximal to mid RCA b. 09/2017: DES to mid  LAD and OM2. Patent stents along RCA.   [name] CHF (congestive heart failure) ([place])   . Essential hypertension   . History of atrial flutter    Status post ablation 2008 - Dr. Caryl Comes  . History of transient ischemic attack (TIA)   . Mixed hyperlipidemia    Statin intolerance  . Pneumonia   . Type 2 diabetes mellitus (HCC)    A1C 11.5 06/2017    Past Surgical History:  Procedure Laterality Date  . BALLOON ANGIOPLASTY, ARTERY    . BICEPS TENDON REPAIR Left   . CARDIAC ELECTROPHYSIOLOGY MAPPING AND ABLATION    . CARPAL TUNNEL RELEASE Bilateral   . cervical neck fusion     x 4  . CORONARY ANGIOPLASTY    . CORONARY STENT INTERVENTION N/A 10/17/2017   Procedure: CORONARY STENT INTERVENTION;  Surgeon: Martinique, Peter M, MD;  Location: [facility];  Service: Cardiovascular;  Laterality: N/A;  . CORONARY STENT PLACEMENT     x 2  . CYSTOSCOPY W/ RETROGRADES Bilateral 10/08/2019   Procedure: CYSTOSCOPY WITH  BILATERAL RETROGRADE PYELOGRAM;  Surgeon: Cleon Gustin, MD;  Location: [facility];  Service: Urology;  Laterality: Bilateral;  . CYSTOSCOPY W/ URETERAL STENT PLACEMENT Bilateral 01/26/2020   Procedure: CYSTOSCOPY WITH RETROGRADE PYELOGRAM bilateral right ureter stent removal;  Surgeon: Irine Seal, MD;  Location: [facility];  Service: Urology;  Laterality: Bilateral;  . CYSTOSCOPY WITH FULGERATION N/A 01/26/2020   Procedure: CYSTOSCOPY WITH FULGERATION, CLOT EVACUATION transurethral resection bladder tumor;  Surgeon: Irine Seal, MD;  Location: [facility];  Service: Urology;  Laterality: N/A;  . CYSTOSCOPY WITH URETHRAL DILATATION  10/08/2019   Procedure: CYSTOSCOPY WITH URETHRAL DILATATION;  Surgeon: Cleon Gustin, MD;  Location: [facility];  Service: Urology;;  . LEFT HEART CATH AND CORONARY ANGIOGRAPHY N/A 10/17/2017   Procedure: LEFT HEART CATH AND CORONARY ANGIOGRAPHY;  Surgeon: Martinique, Peter M, MD;  Location: [facility];  Service: Cardiovascular;  Laterality: N/A;  . ROTATOR CUFF  REPAIR Right    x 4  . TIBIA FRACTURE SURGERY Left   . TRANSURETHRAL RESECTION OF BLADDER TUMOR N/A 10/08/2019   Procedure: TRANSURETHRAL RESECTION OF BLADDER TUMOR (TURBT);  Surgeon: Cleon Gustin, MD;  Location: [facility];  Service: Urology;  Laterality: N/A;       HPI  from the history and physical done on the day of admission:     Richard [name]  is a 75 y.o. male a 75 year old male with past medical history relevant for CAD, CKD 3 a, HTN, DM, and persistent hematuria secondary to bladder malignancy who presented for cardiology office with worsening shortness of breath palpitations and dizziness and is found to be in A. fib with RVR as well as CHF exacerbation -- -Despite increasing diuretics at home patient continues to have significant weight gain and lower extremity edema and dyspnea on exertion  -Denies chest pains, -Additional history obtained at bedside from patient's wife -Notes from cardiology clinic reviewed No fever  Or chills  No Nausea, Vomiting or Diarrhea     Hospital Course:    Brief Summary- 74 y.o.malewith past medical history of CAD (s/p DES to RCA in 2007, DES to mid-LAD and DES to OM2 in 19/6222),[id number] combined systolic and diastolic CHF (EF 19-41% in 12/2019, at 30-35% by repeat echo in 01/2020), paroxysmalatrial flutter (s/p ablation in 2008), HTN, HLD, Type 2 DM, prior TIA,bladder cancer (s/p TURBT in 09/2019 andcurrently undergoing radiation)and former tobacco use (quit in 06/2017) admitted on 03/19/2020 directly from cardiology clinic due to paroxysmal A. fib with RVR as well as worsening CHF exacerbation after failing adjustments of his oral diuretic regimen   A/p 1)ParoxysmalA. Fib/Flutter with RVR/MAT----previous ablation 2008 -Patient also had some MAT noted this admission -Narrow complex irregular tach-- -Cardiology consult appreciated -Continue Toprol-XL at 100 mg daily --Low EF precludes Cardizem use -CHADSVASC=6. Aim for rate  control. Not a candidate for cardioversion or anticoagulationdue to high bleeding risk in the setting of hematuria from bladder malignancy -Rate control improved on Toprol and amiodarone -Patient is essentially back to sinus rhythm at this time -- continue amiodarone 400 mg twice daily for 1 month,  cardiologist will adjust amiodarone at next office visit  2)Acute on chronic systolic and diastolic CHF LVEF 30 to 74% on echo 01/2020 --Failed oral diuretics including increased dose of Demadex at home(PTA) avoid ACEI/ARB/ARNI due to renal concerns --Unable to give Entresto due to renal concerns at this time -Okay to use isosorbide/hydralazine combo -Fluid balance is negative -Dyspnea on exertion  mostly resolved -Creatinine trended up  and peaked  at 2.04, will give patient a diuretic holiday and creatinine is back down to 1.8  -Baseline creatinine is 1.4  -Discharge on torsemide 40 mg daily which is what patient was taking PTA, -Repeat BMP with cardiologist or PCP in about 4 days advised -Amlodipine discontinued due to worsening lower extremity edema,  - 3)CADwith prior stents---s/p DES to RCA in 2007withDES to mid-LAD and DES to OM2 in 09/2017 -No ACS type symptoms at this time continue medical management, ---poor candidate for PCI since he cannot take DAPT.  -Continue isosorbide, Lipitor and metoprolol  4)Bladder CA with hematuria status post resection and clot evacuation/s/p TURBT in 09/2019 ---  currently undergoing radiation---Dr. Delton Coombes aware of admission to the hospital  5)CKD stage -3A -creatinine trending up, please see #2 above -Diuretics adjusted due to rising creatinine renally adjust medications, avoid nephrotoxic agents / dehydration / hypotension  6)chronic anemia---suspect multifactorial anemia in the setting of CKD and ongoing hematuria -Hemoglobin is currently stable at 9.7 recent anemia work-up consistent with iron deficiency anemia  with serum iron of 31 and iron saturation of 8 and ferritin of 22 -B12 and folate were normal -Continue iron infusion last infusion of iron is 03/20/2020  7)DM2-A1c is 9.6 reflecting uncontrolled diabetes with hyperglycemia PTA  -Hold Metformin due to kidney concerns -Continue glipizide  Disposition--discharge home in stable condition on oral diuretic  Dispo: The patient is from:Home Anticipated d/c is WP:YKDX  Code Status : -  Code Status: Full Code   Family Communication:   (patient is alert, awake and coherent)  Discussed with daughter Abigail Butts  Consults  :  cardiology  DVT Prophylaxis  :   - SCDs  SCDs Start: 03/19/20 1300 Place TED hose Start: 03/19/20 1300 --- high bleeding risk  Discharge Condition: Stable without hypoxia  Follow UP--- PCP and cardiology  Diet and Activity recommendation:  As advised  Discharge Instructions    Discharge Instructions    Amb referral to AFIB Clinic   Complete by: As directed    Call MD for:  difficulty breathing, headache or visual disturbances   Complete by: As directed    Call MD for:  persistant dizziness or light-headedness   Complete by: As directed    Call MD for:  persistant nausea and vomiting   Complete by: As directed    Call MD for:  temperature >100.4   Complete by: As directed    Diet - low sodium heart healthy   Complete by: As directed    Diet Carb Modified   Complete by: As directed    Discharge instructions   Complete by: As directed    1)Very low-salt diet advised 2)Weigh yourself daily, call if you gain more than 3 pounds in 1 day or more than 5 pounds in 1 week as your diuretic medications may need to be adjusted 3)Limit your Fluid  intake to no more than 60 ounces (1.8 Liters) per day 4) Metformin/Glucophage has been discontinued due to kidney concerns 5) please Stop amlodipine as amlodipine can contribute to leg swelling and take hydralazine with isosorbide instead for your  heart 6)Please keep a record/chart of your daily weight--and take this with you when you go to see your cardiologist -Please monitor your urine output and let your cardiologist know if you are not voiding enough 7)Okay to use teds/compression stockings as advised every morning-- take them off at bedtime 8)You need a repeat BMP (kidney and electrolyte) blood test in about  4 to 5 days   Increase  activity slowly   Complete by: As directed         Discharge Medications     Allergies as of 03/23/2020      Reactions   Statins Other (See Comments)   Leg pain, tolerates lipitor       Medication List    STOP taking these medications   amLODipine 2.5 MG tablet Commonly known as: NORVASC   metFORMIN 1000 MG tablet Commonly known as: GLUCOPHAGE     TAKE these medications   acetaminophen 325 MG tablet Commonly known as: TYLENOL Take 2 tablets (650 mg total) by mouth every 6 (six) hours as needed for mild pain (or Fever >/= 101).   ALPRAZolam 0.5 MG tablet Commonly known as: XANAX Take 0.5 mg by mouth 2 (two) times daily as needed.   amiodarone 400 MG tablet Commonly known as: PACERONE Take 1 tablet (400 mg total) by mouth 2 (two) times daily.   atorvastatin 10 MG tablet Commonly known as: LIPITOR Take 10 mg by mouth daily.   B-D ULTRAFINE III SHORT PEN 31G X 8 MM Misc Generic drug: Insulin Pen Needle Inject into the skin.   glipiZIDE 10 MG 24 hr tablet Commonly known as: GLUCOTROL XL Take 10 mg by mouth 2 (two) times daily.   hydrALAZINE 25 MG tablet Commonly known as: APRESOLINE Take 1 tablet (25 mg total) by mouth 2 (two) times daily.   isosorbide mononitrate 30 MG 24 hr tablet Commonly known as: IMDUR Take 1 tablet (30 mg total) by mouth at bedtime.   metoprolol succinate 100 MG 24 hr tablet Commonly known as: TOPROL-XL Take 1 tablet (100 mg total) by mouth daily. Take with or immediately following a meal.   nitroGLYCERIN 0.4 MG SL tablet Commonly known as:  NITROSTAT Place 1 tablet (0.4 mg total) under the tongue every 5 (five) minutes as needed for chest pain.   Oxycodone HCl 10 MG Tabs Take 1 tablet (10 mg total) by mouth 4 (four) times daily as needed.   torsemide 20 MG tablet Commonly known as: DEMADEX Take 2 tablets (40 mg total) by mouth daily.       Major procedures and Radiology Reports - PLEASE review detailed and final reports for all details, in brief -   DG Chest 2 View  Result Date: 03/21/2020 CLINICAL DATA:  Shortness of breath, lower extremity swelling, history diabetes mellitus, CHF, coronary artery disease, smoker, bladder cancer; COVID-19 negative on 03/19/2020 EXAM: CHEST - 2 VIEW COMPARISON:  01/25/2020 FINDINGS: Upper normal heart size. Slight pulmonary vascular congestion. Atherosclerotic calcification aorta. Increased interstitial markings throughout both lung since previous exam, question pulmonary edema, though atypical infection not completely excluded. Tiny bibasilar effusions. No pneumothorax. Bones demineralized with BILATERAL glenohumeral degenerative changes and evidence of prior cervical spine fusion. IMPRESSION: Diffuse interstitial infiltrates bilaterally favor pulmonary edema as above. Tiny bibasilar pleural effusions. Electronically Signed   By: Lavonia Dana M.D.   On: 03/21/2020 08:27    Micro Results    Recent Results (from the past 240 hour(s))  SARS CORONAVIRUS 2 (TAT 6-24 HRS) Nasopharyngeal Nasopharyngeal Swab     Status: None   Collection Time: 03/19/20  5:07 PM   Specimen: Nasopharyngeal Swab  Result Value Ref Range Status   SARS Coronavirus 2 NEGATIVE NEGATIVE Final    Comment: (NOTE) SARS-CoV-2 target nucleic acids are NOT DETECTED.  The SARS-CoV-2 RNA is generally detectable in upper and lower respiratory specimens during the acute phase of infection. Negative results do not preclude SARS-CoV-2  infection, do not rule out co-infections with other pathogens, and should not be used as  the sole basis for treatment or other patient management decisions. Negative results must be combined with clinical observations, patient history, and epidemiological information. The expected result is Negative.  Fact Sheet for Patients: SugarRoll.be  Fact Sheet for Healthcare Providers: https://www.woods-mathews.com/  This test is not yet approved or cleared by the [place] FDA and  has been authorized for detection and/or diagnosis of SARS-CoV-2 by FDA under an Emergency Use Authorization (EUA). This EUA will remain  in effect (meaning this test can be used) for the duration of the COVID-19 declaration under Se ction 564(b)(1) of the Act, 21 U.S.C. section 360bbb-3(b)(1), unless the authorization is terminated or revoked sooner.  Performed at [facility], [place] [address], [place], [place] [address]        Today   Subjective    Richard [name] today has no new complaints Ambulating without significant dyspnea on exertion or hypoxia -Appears to be staying in sinus rhythm even with ambulation          Patient has been seen and examined prior to discharge   Objective   Blood pressure (!) 134/59, pulse 64, temperature 97.7 F (36.5 C), temperature source Oral, resp. rate 12, height 6' (1.829 m), weight 81.6 kg, SpO2 100 %.   Intake/Output Summary (Last 24 hours) at 03/23/2020 1457 Last data filed at 03/23/2020 1300 Gross per 24 hour  Intake 483 ml  Output 575 ml  Net -92 ml    Exam  Gen:- Awake Alert, no acute distress HEENT:- [place].AT, No sclera icterus Neck-Supple Neck, .  Lungs-improved air movement, no wheezing CV- S1, S2 normal, irregular abd-  +ve B.Sounds, Abd Soft, No tenderness,    Extremity/Skin:-Improved lower extremity pitting edema, pedal pulses present  Psych-affect is appropriate, oriented x3 Neuro-  no new focal deficits, no tremors   Data Review   CBC w Diff:  Lab Results  Component Value  Date   WBC 6.8 03/23/2020   HGB 9.7 (L) 03/23/2020   HCT 31.3 (L) 03/23/2020   PLT 289 03/23/2020   LYMPHOPCT 7 03/17/2020   MONOPCT 7 03/17/2020   EOSPCT 1 03/17/2020   BASOPCT 1 03/17/2020    CMP:  Lab Results  Component Value Date   NA 131 (L) 03/23/2020   K 3.9 03/23/2020   CL 96 (L) 03/23/2020   CO2 27 03/23/2020   BUN 55 (H) 03/23/2020   CREATININE 1.84 (H) 03/23/2020   PROT 7.3 03/17/2020   ALBUMIN 3.3 (L) 03/17/2020   BILITOT 0.5 03/17/2020   ALKPHOS 176 (H) 03/17/2020   AST 20 03/17/2020   ALT 21 03/17/2020  .   Total Discharge time is about 33 minutes  Roxan Hockey M.D on 03/23/2020 at 2:57 PM  Go to [email or web address] -  for contact info  [facility] - Office  [phone]

## 2020-03-24 ENCOUNTER — Telehealth: Payer: Self-pay | Admitting: Cardiology

## 2020-03-24 ENCOUNTER — Ambulatory Visit: Payer: Medicare Other

## 2020-03-24 ENCOUNTER — Telehealth: Payer: Self-pay | Admitting: Radiation Oncology

## 2020-03-24 NOTE — Telephone Encounter (Signed)
Clarified that patient should still take Imdur at [facility]

## 2020-03-24 NOTE — Telephone Encounter (Signed)
Noted patient was discharged from [facility] yesterday afternoon. Phoned to inquire about presenting for radiation therapy. Spoke with patient and wife over speaker phone. Patient confirms he is feeling better but would like to stay home today. Patient committed to resuming radiation treatment tomorrow 03/25/20 at 1455. Informed Trudee Kuster, RT on L1 and Dr. Tammi Klippel of these findings.

## 2020-03-24 NOTE — Telephone Encounter (Signed)
New message     Patient wife needs to clarify instructions for the isosorbide 30 mg , on the discharge paperwork it say to take new medication with isosorbide? But it doesn't show on the medication list , she needs clarification .

## 2020-03-25 ENCOUNTER — Ambulatory Visit: Payer: Medicare Other

## 2020-03-26 ENCOUNTER — Ambulatory Visit: Payer: Medicare Other

## 2020-03-26 ENCOUNTER — Ambulatory Visit
Admission: RE | Admit: 2020-03-26 | Discharge: 2020-03-26 | Disposition: A | Discharge status: Home / Self Care | Payer: Medicare Other | Source: Ambulatory Visit | Attending: Radiation Oncology | Admitting: Radiation Oncology

## 2020-03-26 ENCOUNTER — Other Ambulatory Visit: Payer: Self-pay

## 2020-03-26 DIAGNOSIS — Z510 Encounter for antineoplastic radiation therapy: Secondary | ICD-10-CM | POA: Diagnosis not present

## 2020-03-26 DIAGNOSIS — C679 Malignant neoplasm of bladder, unspecified: Secondary | ICD-10-CM | POA: Insufficient documentation

## 2020-03-26 DIAGNOSIS — C671 Malignant neoplasm of dome of bladder: Secondary | ICD-10-CM | POA: Diagnosis not present

## 2020-03-27 ENCOUNTER — Ambulatory Visit: Payer: Medicare Other

## 2020-03-27 ENCOUNTER — Other Ambulatory Visit (HOSPITAL_COMMUNITY)
Admission: RE | Admit: 2020-03-27 | Discharge: 2020-03-27 | Disposition: A | Discharge status: Home / Self Care | Payer: Medicare Other | Source: Ambulatory Visit | Attending: Cardiology | Admitting: Cardiology

## 2020-03-27 ENCOUNTER — Ambulatory Visit
Admission: RE | Admit: 2020-03-27 | Discharge: 2020-03-27 | Disposition: A | Discharge status: Home / Self Care | Payer: Medicare Other | Source: Ambulatory Visit | Attending: Radiation Oncology | Admitting: Radiation Oncology

## 2020-03-27 ENCOUNTER — Other Ambulatory Visit: Payer: Self-pay

## 2020-03-27 ENCOUNTER — Ambulatory Visit (HOSPITAL_COMMUNITY)
Admission: RE | Admit: 2020-03-27 | Discharge: 2020-03-27 | Disposition: A | Discharge status: Home / Self Care | Payer: Medicare Other | Source: Ambulatory Visit | Attending: Hematology | Admitting: Hematology

## 2020-03-27 DIAGNOSIS — C678 Malignant neoplasm of overlapping sites of bladder: Secondary | ICD-10-CM | POA: Diagnosis not present

## 2020-03-27 DIAGNOSIS — Z79899 Other long term (current) drug therapy: Secondary | ICD-10-CM

## 2020-03-27 DIAGNOSIS — Z510 Encounter for antineoplastic radiation therapy: Secondary | ICD-10-CM | POA: Diagnosis not present

## 2020-03-27 DIAGNOSIS — M545 Low back pain, unspecified: Secondary | ICD-10-CM | POA: Diagnosis not present

## 2020-03-27 DIAGNOSIS — C679 Malignant neoplasm of bladder, unspecified: Secondary | ICD-10-CM | POA: Diagnosis not present

## 2020-03-27 DIAGNOSIS — C671 Malignant neoplasm of dome of bladder: Secondary | ICD-10-CM | POA: Diagnosis not present

## 2020-03-27 LAB — BASIC METABOLIC PANEL
Anion gap: 9 (ref 5–15)
BUN: 57 mg/dL — ABNORMAL HIGH (ref 8–23)
CO2: 30 mmol/L (ref 22–32)
Calcium: 9 mg/dL (ref 8.9–10.3)
Chloride: 94 mmol/L — ABNORMAL LOW (ref 98–111)
Creatinine, Ser: 2.02 mg/dL — ABNORMAL HIGH (ref 0.61–1.24)
GFR, Estimated: 34 mL/min — ABNORMAL LOW (ref >60)
Glucose, Bld: 148 mg/dL — ABNORMAL HIGH (ref 70–99)
Potassium: 4.2 mmol/L (ref 3.5–5.1)
Sodium: 133 mmol/L — ABNORMAL LOW (ref 135–145)

## 2020-03-27 LAB — CBC
HCT: 33.5 % — ABNORMAL LOW (ref 39.0–52.0)
Hemoglobin: 10.2 g/dL — ABNORMAL LOW (ref 13.0–17.0)
MCH: 26.3 pg (ref 26.0–34.0)
MCHC: 30.4 g/dL (ref 30.0–36.0)
MCV: 86.3 fL (ref 80.0–100.0)
Platelets: 254 10*3/uL (ref 150–400)
RBC: 3.88 MIL/uL — ABNORMAL LOW (ref 4.22–5.81)
RDW: 19.3 % — ABNORMAL HIGH (ref 11.5–15.5)
WBC: 6.8 10*3/uL (ref 4.0–10.5)
nRBC: 0 % (ref 0.0–0.2)

## 2020-03-27 MED ORDER — GADOBUTROL 1 MMOL/ML IV SOLN
8.0000 mL | Freq: Once | INTRAVENOUS | Status: AC | PRN
  Administered 2020-03-27: 8 mL via INTRAVENOUS

## 2020-03-28 ENCOUNTER — Encounter (HOSPITAL_COMMUNITY): Payer: Self-pay

## 2020-03-28 ENCOUNTER — Inpatient Hospital Stay (HOSPITAL_COMMUNITY): Payer: Medicare Other | Attending: Hematology

## 2020-03-28 ENCOUNTER — Ambulatory Visit: Payer: Medicare Other

## 2020-03-28 ENCOUNTER — Other Ambulatory Visit: Payer: Self-pay | Admitting: Cardiology

## 2020-03-28 ENCOUNTER — Ambulatory Visit
Admission: RE | Admit: 2020-03-28 | Discharge: 2020-03-28 | Disposition: A | Discharge status: Home / Self Care | Payer: Medicare Other | Source: Ambulatory Visit | Attending: Radiation Oncology | Admitting: Radiation Oncology

## 2020-03-28 VITALS — BP 150/65 | HR 60 | Temp 97.1°F | Resp 18

## 2020-03-28 DIAGNOSIS — I509 Heart failure, unspecified: Secondary | ICD-10-CM | POA: Diagnosis not present

## 2020-03-28 DIAGNOSIS — M25552 Pain in left hip: Secondary | ICD-10-CM | POA: Diagnosis not present

## 2020-03-28 DIAGNOSIS — K5900 Constipation, unspecified: Secondary | ICD-10-CM | POA: Diagnosis not present

## 2020-03-28 DIAGNOSIS — C679 Malignant neoplasm of bladder, unspecified: Secondary | ICD-10-CM | POA: Diagnosis not present

## 2020-03-28 DIAGNOSIS — Z510 Encounter for antineoplastic radiation therapy: Secondary | ICD-10-CM | POA: Diagnosis not present

## 2020-03-28 DIAGNOSIS — C671 Malignant neoplasm of dome of bladder: Secondary | ICD-10-CM | POA: Diagnosis not present

## 2020-03-28 DIAGNOSIS — Z79899 Other long term (current) drug therapy: Secondary | ICD-10-CM | POA: Insufficient documentation

## 2020-03-28 DIAGNOSIS — I4891 Unspecified atrial fibrillation: Secondary | ICD-10-CM | POA: Insufficient documentation

## 2020-03-28 DIAGNOSIS — N1831 Chronic kidney disease, stage 3a: Secondary | ICD-10-CM | POA: Diagnosis not present

## 2020-03-28 DIAGNOSIS — D649 Anemia, unspecified: Secondary | ICD-10-CM | POA: Insufficient documentation

## 2020-03-28 DIAGNOSIS — M545 Low back pain, unspecified: Secondary | ICD-10-CM | POA: Diagnosis not present

## 2020-03-28 MED ORDER — SODIUM CHLORIDE 0.9 % IV SOLN: Freq: Once | INTRAVENOUS | Status: AC

## 2020-03-28 MED ORDER — SODIUM CHLORIDE 0.9 % IV SOLN
510.0000 mg | Freq: Once | INTRAVENOUS | Status: AC
  Administered 2020-03-28: 510 mg via INTRAVENOUS
  Filled 2020-03-28: qty 510

## 2020-03-28 NOTE — Progress Notes (Signed)
Patient tolerated iron infusion with no complaints voiced.  Peripheral IV site clean and dry with good blood return noted before and after infusion.  Band aid applied.  VSS with discharge and left in satisfactory condition with no s/s of distress noted.   

## 2020-03-28 NOTE — Progress Notes (Signed)
Patient received one dose of Feraheme as inpatient on 03/20/20- Ok with Dr Delton Coombes to give second dose as Feraheme and discontinue Injectafer.  V.O. Dr Alphonzo Severance Aurora Mask, PharmD 03/27/2020 @ 1700

## 2020-03-30 ENCOUNTER — Ambulatory Visit: Payer: Medicare Other

## 2020-03-31 ENCOUNTER — Encounter (HOSPITAL_COMMUNITY): Payer: Self-pay | Admitting: Hematology

## 2020-03-31 ENCOUNTER — Ambulatory Visit
Admission: RE | Admit: 2020-03-31 | Discharge: 2020-03-31 | Disposition: A | Discharge status: Home / Self Care | Payer: Medicare Other | Source: Ambulatory Visit | Attending: Radiation Oncology | Admitting: Radiation Oncology

## 2020-03-31 ENCOUNTER — Ambulatory Visit (HOSPITAL_COMMUNITY): Payer: Medicare Other

## 2020-03-31 ENCOUNTER — Ambulatory Visit (HOSPITAL_COMMUNITY): Payer: Medicare Other | Admitting: Hematology

## 2020-03-31 ENCOUNTER — Encounter: Payer: Self-pay | Admitting: Radiation Oncology

## 2020-03-31 ENCOUNTER — Inpatient Hospital Stay (HOSPITAL_BASED_OUTPATIENT_CLINIC_OR_DEPARTMENT_OTHER): Payer: Medicare Other | Admitting: Hematology

## 2020-03-31 ENCOUNTER — Other Ambulatory Visit: Payer: Self-pay

## 2020-03-31 DIAGNOSIS — D494 Neoplasm of unspecified behavior of bladder: Secondary | ICD-10-CM

## 2020-03-31 DIAGNOSIS — C671 Malignant neoplasm of dome of bladder: Secondary | ICD-10-CM | POA: Diagnosis not present

## 2020-03-31 DIAGNOSIS — C679 Malignant neoplasm of bladder, unspecified: Secondary | ICD-10-CM | POA: Diagnosis not present

## 2020-03-31 DIAGNOSIS — Z510 Encounter for antineoplastic radiation therapy: Secondary | ICD-10-CM | POA: Diagnosis not present

## 2020-03-31 NOTE — Progress Notes (Signed)
  Radiation Oncology         [phone] ________________________________  Name: Richard [name] MRN: [id number]  Date: 03/31/2020  DOB: 1945-12-16  Chart Note:  Patient receiving radiotherapy alone for muscle invasive bladder cancer initially felt to be non-metastatic.  On MRI, he has a left iliac skeletal lesion, consistent with metastatic disease.  Dr. Raliegh Ip is going to obtain follow-up imaging to confirm.    ________________________________  Sheral Apley Tammi Klippel, M.D.

## 2020-03-31 NOTE — Progress Notes (Signed)
Virtual Visit via Telephone Note  I connected with Richard [name] on 03/31/20 at  9:15 AM EST by telephone and verified that I am speaking with the correct person using two identifiers.  Location: Patient: At home Provider: In the office   I discussed the limitations, risks, security and privacy concerns of performing an evaluation and management service by telephone and the availability of in person appointments. I also discussed with the patient that there may be a patient responsible charge related to this service. The patient expressed understanding and agreed to proceed.   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  He is currently undergoing radiation therapy.   Observations/Objective:  He reports that left hip pain is better controlled with oxycodone 10 mg every 6 hours as needed.  Most of the time he is taking up to 4 tablets daily.  Denies any drowsiness.  Mild constipation present.  Recently hospitalized with A. fib and RVR.  His Lasix was apparently increased to 40 mg daily and he has lost about 10 pounds.   Assessment and Plan: 1.  Bladder cancer: -He is receiving radiation therapy under the direction of Dr. Tammi Klippel.  He will apparently finish radiation on 04/14/2020. -In view of the bone lesion on the MRI, I have recommended PET CT scan. -I have also recommended biopsy of the bone lesion to document metastatic disease. -He would like to wait until after the PET scan is done for biopsy.  2.  Low back pain: -I have reviewed results of the MRI of the lumbar spine which showed large posterior left-sided iliac lesion measuring 6 x 5.3 cm.  3.  CHF: -Continue Lasix 40 mg daily.  Follow Up Instructions: RTC after the PET scan.   I discussed the assessment and treatment plan with the patient. The patient was provided an  opportunity to ask questions and all were answered. The patient agreed with the plan and demonstrated an understanding of the instructions.   The patient was advised to call back or seek an in-person evaluation if the symptoms worsen or if the condition fails to improve as anticipated.  I provided 21 minutes of non-face-to-face time during this encounter.   Derek Jack, MD

## 2020-03-31 NOTE — Progress Notes (Signed)
  Radiation Oncology         [phone] ________________________________  Name: Richard [name] MRN: [id number]  Date: 01/28/2020  DOB: 1945-05-11  SIMULATION AND TREATMENT PLANNING NOTE    ICD-10-CM   1. 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    NARRATIVE:  The patient was brought to the [facility].  Identity was confirmed.  All relevant records and images related to the planned course of therapy were reviewed.  The patient freely provided informed written consent to proceed with treatment after reviewing the details related to the planned course of therapy. The consent form was witnessed and verified by the simulation staff.  Then, the patient was set-up in a stable reproducible  supine position for radiation therapy.  Contrast was instilled into the bladder with a catheter under sterile conditions.  CT images were obtained.  Surface markings were placed.  The CT images were loaded into the planning software.  Then the target and avoidance structures were contoured.  Treatment planning then occurred.  The radiation prescription was entered and confirmed.  Then, I designed and supervised the construction of a total of 5 medically necessary complex treatment devices including VacLoc body positioner and 4 MLCs to shield the bowel and femoral necks.  I have requested : 3D Simulation  I have requested a DVH of the following structures: small bowel, rectum, left femoral head, right femoral head and targets.  SPECIAL TREATMENT PROCEDURE:  The planned course of therapy using radiation constitutes a special treatment procedure. Special care is required in the management of this patient for the following reasons. This treatment constitutes a Special Treatment Procedure for the following reason: [ Concurrent chemotherapy requiring careful monitoring for  increased toxicities of treatment including weekly laboratory values..  The special nature of the planned course of radiotherapy will require increased physician supervision and oversight to ensure patient's safety with optimal treatment outcomes.  PLAN:  The patient will receive 19.8 Gy in 11 fractions of 1.8 Gy to the bladder tumor with full bladder, then 45 Gy in 25 fractions to the whole bladder and pelvic nodes to a total dose of 64.8 Gy.  ________________________________  Sheral Apley Tammi Klippel, M.D.

## 2020-04-01 ENCOUNTER — Other Ambulatory Visit: Payer: Self-pay

## 2020-04-01 ENCOUNTER — Ambulatory Visit: Payer: Medicare Other

## 2020-04-01 ENCOUNTER — Ambulatory Visit
Admission: RE | Admit: 2020-04-01 | Discharge: 2020-04-01 | Disposition: A | Discharge status: Home / Self Care | Payer: Medicare Other | Source: Ambulatory Visit | Attending: Radiation Oncology | Admitting: Radiation Oncology

## 2020-04-01 DIAGNOSIS — C671 Malignant neoplasm of dome of bladder: Secondary | ICD-10-CM | POA: Diagnosis not present

## 2020-04-01 DIAGNOSIS — C679 Malignant neoplasm of bladder, unspecified: Secondary | ICD-10-CM | POA: Diagnosis not present

## 2020-04-01 DIAGNOSIS — Z510 Encounter for antineoplastic radiation therapy: Secondary | ICD-10-CM | POA: Diagnosis not present

## 2020-04-02 ENCOUNTER — Ambulatory Visit: Payer: Medicare Other

## 2020-04-02 ENCOUNTER — Other Ambulatory Visit: Payer: Self-pay

## 2020-04-02 ENCOUNTER — Ambulatory Visit
Admission: RE | Admit: 2020-04-02 | Discharge: 2020-04-02 | Disposition: A | Discharge status: Home / Self Care | Payer: Medicare Other | Source: Ambulatory Visit | Attending: Radiation Oncology | Admitting: Radiation Oncology

## 2020-04-02 DIAGNOSIS — C679 Malignant neoplasm of bladder, unspecified: Secondary | ICD-10-CM | POA: Diagnosis not present

## 2020-04-02 DIAGNOSIS — Z510 Encounter for antineoplastic radiation therapy: Secondary | ICD-10-CM | POA: Diagnosis not present

## 2020-04-02 DIAGNOSIS — C671 Malignant neoplasm of dome of bladder: Secondary | ICD-10-CM | POA: Diagnosis not present

## 2020-04-03 ENCOUNTER — Ambulatory Visit: Payer: Medicare Other

## 2020-04-03 ENCOUNTER — Other Ambulatory Visit: Payer: Self-pay

## 2020-04-03 ENCOUNTER — Ambulatory Visit
Admission: RE | Admit: 2020-04-03 | Discharge: 2020-04-03 | Disposition: A | Discharge status: Home / Self Care | Payer: Medicare Other | Source: Ambulatory Visit | Attending: Radiation Oncology | Admitting: Radiation Oncology

## 2020-04-03 DIAGNOSIS — C679 Malignant neoplasm of bladder, unspecified: Secondary | ICD-10-CM | POA: Diagnosis not present

## 2020-04-03 DIAGNOSIS — C671 Malignant neoplasm of dome of bladder: Secondary | ICD-10-CM | POA: Diagnosis not present

## 2020-04-03 DIAGNOSIS — Z510 Encounter for antineoplastic radiation therapy: Secondary | ICD-10-CM | POA: Diagnosis not present

## 2020-04-04 ENCOUNTER — Ambulatory Visit
Admission: RE | Admit: 2020-04-04 | Discharge: 2020-04-04 | Disposition: A | Discharge status: Home / Self Care | Payer: Medicare Other | Source: Ambulatory Visit | Attending: Radiation Oncology | Admitting: Radiation Oncology

## 2020-04-04 ENCOUNTER — Ambulatory Visit: Payer: Medicare Other

## 2020-04-04 ENCOUNTER — Ambulatory Visit (HOSPITAL_COMMUNITY): Payer: Medicare Other

## 2020-04-04 ENCOUNTER — Other Ambulatory Visit: Payer: Self-pay

## 2020-04-04 DIAGNOSIS — C671 Malignant neoplasm of dome of bladder: Secondary | ICD-10-CM | POA: Diagnosis not present

## 2020-04-04 DIAGNOSIS — C679 Malignant neoplasm of bladder, unspecified: Secondary | ICD-10-CM | POA: Diagnosis not present

## 2020-04-04 DIAGNOSIS — Z510 Encounter for antineoplastic radiation therapy: Secondary | ICD-10-CM | POA: Diagnosis not present

## 2020-04-07 ENCOUNTER — Ambulatory Visit
Admission: RE | Admit: 2020-04-07 | Discharge: 2020-04-07 | Disposition: A | Discharge status: Home / Self Care | Payer: Medicare Other | Source: Ambulatory Visit | Attending: Radiation Oncology | Admitting: Radiation Oncology

## 2020-04-07 ENCOUNTER — Ambulatory Visit: Payer: Medicare Other

## 2020-04-07 ENCOUNTER — Ambulatory Visit (HOSPITAL_COMMUNITY): Payer: Medicare Other

## 2020-04-07 DIAGNOSIS — C671 Malignant neoplasm of dome of bladder: Secondary | ICD-10-CM | POA: Diagnosis not present

## 2020-04-07 DIAGNOSIS — Z510 Encounter for antineoplastic radiation therapy: Secondary | ICD-10-CM | POA: Diagnosis not present

## 2020-04-07 DIAGNOSIS — C679 Malignant neoplasm of bladder, unspecified: Secondary | ICD-10-CM | POA: Diagnosis not present

## 2020-04-08 ENCOUNTER — Ambulatory Visit: Payer: Medicare Other

## 2020-04-08 ENCOUNTER — Telehealth: Payer: Self-pay | Admitting: Physician Assistant

## 2020-04-08 ENCOUNTER — Ambulatory Visit
Admission: RE | Admit: 2020-04-08 | Discharge: 2020-04-08 | Disposition: A | Discharge status: Home / Self Care | Payer: Medicare Other | Source: Ambulatory Visit | Attending: Radiation Oncology | Admitting: Radiation Oncology

## 2020-04-08 ENCOUNTER — Encounter (HOSPITAL_COMMUNITY): Payer: Medicare Other

## 2020-04-08 DIAGNOSIS — Z510 Encounter for antineoplastic radiation therapy: Secondary | ICD-10-CM | POA: Diagnosis not present

## 2020-04-08 DIAGNOSIS — C679 Malignant neoplasm of bladder, unspecified: Secondary | ICD-10-CM | POA: Diagnosis not present

## 2020-04-08 DIAGNOSIS — C671 Malignant neoplasm of dome of bladder: Secondary | ICD-10-CM | POA: Diagnosis not present

## 2020-04-08 MED ORDER — TORSEMIDE 20 MG PO TABS: 40.0000 mg | ORAL_TABLET | Freq: Every day | ORAL | 3 refills | Status: DC

## 2020-04-08 NOTE — Telephone Encounter (Signed)
Medication refill request approved and sent to [facility] in [place].

## 2020-04-08 NOTE — Telephone Encounter (Signed)
New message   Patients wife wants to know if he is suppose to continue this medication ?   *STAT* If patient is at the pharmacy, call can be transferred to refill team.   1. Which medications need to be refilled? (please list name of each medication and dose if known) torsemide (DEMADEX) 20 MG tablet  2. Which pharmacy/location (including street and city if local pharmacy) is medication to be sent to? [facility]   3. Do they need a 30 day or 90 day supply? [place]

## 2020-04-09 ENCOUNTER — Ambulatory Visit
Admission: RE | Admit: 2020-04-09 | Discharge: 2020-04-09 | Disposition: A | Discharge status: Home / Self Care | Payer: Medicare Other | Source: Ambulatory Visit | Attending: Radiation Oncology | Admitting: Radiation Oncology

## 2020-04-09 ENCOUNTER — Ambulatory Visit: Payer: Medicare Other

## 2020-04-09 ENCOUNTER — Ambulatory Visit (HOSPITAL_COMMUNITY): Payer: Medicare Other | Admitting: Hematology

## 2020-04-09 DIAGNOSIS — C671 Malignant neoplasm of dome of bladder: Secondary | ICD-10-CM | POA: Diagnosis not present

## 2020-04-09 DIAGNOSIS — C679 Malignant neoplasm of bladder, unspecified: Secondary | ICD-10-CM | POA: Diagnosis not present

## 2020-04-09 DIAGNOSIS — Z510 Encounter for antineoplastic radiation therapy: Secondary | ICD-10-CM | POA: Diagnosis not present

## 2020-04-10 ENCOUNTER — Ambulatory Visit: Payer: Medicare Other

## 2020-04-10 ENCOUNTER — Ambulatory Visit
Admission: RE | Admit: 2020-04-10 | Discharge: 2020-04-10 | Disposition: A | Discharge status: Home / Self Care | Payer: Medicare Other | Source: Ambulatory Visit | Attending: Radiation Oncology | Admitting: Radiation Oncology

## 2020-04-10 ENCOUNTER — Inpatient Hospital Stay (HOSPITAL_COMMUNITY): Payer: Medicare Other | Admitting: Hematology

## 2020-04-10 ENCOUNTER — Other Ambulatory Visit: Payer: Self-pay | Admitting: Cardiology

## 2020-04-10 DIAGNOSIS — C679 Malignant neoplasm of bladder, unspecified: Secondary | ICD-10-CM | POA: Diagnosis not present

## 2020-04-10 DIAGNOSIS — C671 Malignant neoplasm of dome of bladder: Secondary | ICD-10-CM | POA: Diagnosis not present

## 2020-04-10 DIAGNOSIS — Z510 Encounter for antineoplastic radiation therapy: Secondary | ICD-10-CM | POA: Diagnosis not present

## 2020-04-10 NOTE — Telephone Encounter (Signed)
I spoke with [facility] pharmacist and called in new torsemide script, 40 mg by mouth , #180 with RF:3

## 2020-04-11 ENCOUNTER — Ambulatory Visit (INDEPENDENT_AMBULATORY_CARE_PROVIDER_SITE_OTHER): Payer: Medicare Other | Admitting: Cardiovascular Disease

## 2020-04-11 ENCOUNTER — Telehealth: Payer: Self-pay | Admitting: Cardiology

## 2020-04-11 ENCOUNTER — Telehealth (HOSPITAL_COMMUNITY): Payer: Self-pay

## 2020-04-11 ENCOUNTER — Other Ambulatory Visit (HOSPITAL_COMMUNITY): Payer: Self-pay

## 2020-04-11 ENCOUNTER — Ambulatory Visit
Admission: RE | Admit: 2020-04-11 | Discharge: 2020-04-11 | Disposition: A | Discharge status: Home / Self Care | Payer: Medicare Other | Source: Ambulatory Visit | Attending: Radiation Oncology | Admitting: Radiation Oncology

## 2020-04-11 ENCOUNTER — Ambulatory Visit: Payer: Medicare Other

## 2020-04-11 ENCOUNTER — Other Ambulatory Visit: Payer: Self-pay

## 2020-04-11 ENCOUNTER — Encounter: Payer: Self-pay | Admitting: Cardiovascular Disease

## 2020-04-11 VITALS — HR 52 | Ht 72.0 in | Wt 180.0 lb

## 2020-04-11 DIAGNOSIS — I420 Dilated cardiomyopathy: Secondary | ICD-10-CM | POA: Diagnosis not present

## 2020-04-11 DIAGNOSIS — N1832 Chronic kidney disease, stage 3b: Secondary | ICD-10-CM | POA: Diagnosis not present

## 2020-04-11 DIAGNOSIS — I483 Typical atrial flutter: Secondary | ICD-10-CM | POA: Diagnosis not present

## 2020-04-11 DIAGNOSIS — Z7982 Long term (current) use of aspirin: Secondary | ICD-10-CM | POA: Diagnosis not present

## 2020-04-11 DIAGNOSIS — Z20822 Contact with and (suspected) exposure to covid-19: Secondary | ICD-10-CM | POA: Diagnosis not present

## 2020-04-11 DIAGNOSIS — C671 Malignant neoplasm of dome of bladder: Secondary | ICD-10-CM | POA: Diagnosis not present

## 2020-04-11 DIAGNOSIS — I10 Essential (primary) hypertension: Secondary | ICD-10-CM | POA: Diagnosis not present

## 2020-04-11 DIAGNOSIS — Z716 Tobacco abuse counseling: Secondary | ICD-10-CM | POA: Diagnosis not present

## 2020-04-11 DIAGNOSIS — R609 Edema, unspecified: Secondary | ICD-10-CM | POA: Diagnosis present

## 2020-04-11 DIAGNOSIS — Z7984 Long term (current) use of oral hypoglycemic drugs: Secondary | ICD-10-CM | POA: Diagnosis not present

## 2020-04-11 DIAGNOSIS — Z8673 Personal history of transient ischemic attack (TIA), and cerebral infarction without residual deficits: Secondary | ICD-10-CM | POA: Diagnosis not present

## 2020-04-11 DIAGNOSIS — N5089 Other specified disorders of the male genital organs: Secondary | ICD-10-CM | POA: Diagnosis present

## 2020-04-11 DIAGNOSIS — I4892 Unspecified atrial flutter: Secondary | ICD-10-CM | POA: Diagnosis not present

## 2020-04-11 DIAGNOSIS — Z515 Encounter for palliative care: Secondary | ICD-10-CM | POA: Diagnosis not present

## 2020-04-11 DIAGNOSIS — N179 Acute kidney failure, unspecified: Secondary | ICD-10-CM | POA: Diagnosis not present

## 2020-04-11 DIAGNOSIS — D631 Anemia in chronic kidney disease: Secondary | ICD-10-CM | POA: Diagnosis not present

## 2020-04-11 DIAGNOSIS — I5022 Chronic systolic (congestive) heart failure: Secondary | ICD-10-CM | POA: Diagnosis not present

## 2020-04-11 DIAGNOSIS — R001 Bradycardia, unspecified: Secondary | ICD-10-CM | POA: Diagnosis not present

## 2020-04-11 DIAGNOSIS — R0602 Shortness of breath: Secondary | ICD-10-CM | POA: Diagnosis not present

## 2020-04-11 DIAGNOSIS — R600 Localized edema: Secondary | ICD-10-CM | POA: Diagnosis not present

## 2020-04-11 DIAGNOSIS — F17210 Nicotine dependence, cigarettes, uncomplicated: Secondary | ICD-10-CM | POA: Diagnosis not present

## 2020-04-11 DIAGNOSIS — Z981 Arthrodesis status: Secondary | ICD-10-CM | POA: Diagnosis not present

## 2020-04-11 DIAGNOSIS — Z7189 Other specified counseling: Secondary | ICD-10-CM | POA: Diagnosis not present

## 2020-04-11 DIAGNOSIS — M898X9 Other specified disorders of bone, unspecified site: Secondary | ICD-10-CM | POA: Diagnosis not present

## 2020-04-11 DIAGNOSIS — Z79899 Other long term (current) drug therapy: Secondary | ICD-10-CM | POA: Diagnosis not present

## 2020-04-11 DIAGNOSIS — I5023 Acute on chronic systolic (congestive) heart failure: Secondary | ICD-10-CM | POA: Diagnosis not present

## 2020-04-11 DIAGNOSIS — N189 Chronic kidney disease, unspecified: Secondary | ICD-10-CM | POA: Diagnosis not present

## 2020-04-11 DIAGNOSIS — C679 Malignant neoplasm of bladder, unspecified: Secondary | ICD-10-CM | POA: Diagnosis not present

## 2020-04-11 DIAGNOSIS — M79604 Pain in right leg: Secondary | ICD-10-CM | POA: Diagnosis not present

## 2020-04-11 DIAGNOSIS — I2510 Atherosclerotic heart disease of native coronary artery without angina pectoris: Secondary | ICD-10-CM | POA: Diagnosis not present

## 2020-04-11 DIAGNOSIS — E1122 Type 2 diabetes mellitus with diabetic chronic kidney disease: Secondary | ICD-10-CM | POA: Diagnosis not present

## 2020-04-11 DIAGNOSIS — J90 Pleural effusion, not elsewhere classified: Secondary | ICD-10-CM | POA: Diagnosis not present

## 2020-04-11 DIAGNOSIS — E782 Mixed hyperlipidemia: Secondary | ICD-10-CM | POA: Diagnosis not present

## 2020-04-11 DIAGNOSIS — I130 Hypertensive heart and chronic kidney disease with heart failure and stage 1 through stage 4 chronic kidney disease, or unspecified chronic kidney disease: Secondary | ICD-10-CM | POA: Diagnosis not present

## 2020-04-11 DIAGNOSIS — Z955 Presence of coronary angioplasty implant and graft: Secondary | ICD-10-CM | POA: Diagnosis not present

## 2020-04-11 DIAGNOSIS — I5043 Acute on chronic combined systolic (congestive) and diastolic (congestive) heart failure: Secondary | ICD-10-CM | POA: Diagnosis not present

## 2020-04-11 DIAGNOSIS — E46 Unspecified protein-calorie malnutrition: Secondary | ICD-10-CM | POA: Diagnosis not present

## 2020-04-11 DIAGNOSIS — I255 Ischemic cardiomyopathy: Secondary | ICD-10-CM | POA: Diagnosis not present

## 2020-04-11 DIAGNOSIS — E1165 Type 2 diabetes mellitus with hyperglycemia: Secondary | ICD-10-CM | POA: Diagnosis not present

## 2020-04-11 DIAGNOSIS — I480 Paroxysmal atrial fibrillation: Secondary | ICD-10-CM | POA: Diagnosis not present

## 2020-04-11 MED ORDER — OXYCODONE HCL 10 MG PO TABS: 10.0000 mg | ORAL_TABLET | Freq: Four times a day (QID) | ORAL | 0 refills | Status: DC | PRN

## 2020-04-11 MED ORDER — ASPIRIN EC 81 MG PO TBEC: 81.0000 mg | DELAYED_RELEASE_TABLET | Freq: Every day | ORAL | 3 refills | Status: DC

## 2020-04-11 MED ORDER — METOLAZONE 2.5 MG PO TABS: 2.5000 mg | ORAL_TABLET | Freq: Every day | ORAL | 0 refills | Status: DC

## 2020-04-11 MED ORDER — AMIODARONE HCL 200 MG PO TABS: 200.0000 mg | ORAL_TABLET | Freq: Every day | ORAL | 3 refills | Status: DC

## 2020-04-11 NOTE — Telephone Encounter (Addendum)
Called patient's wife back to tell her that patient's pain medication was sent to pharmacy. She wants to know if Dr. Raliegh Ip would give an order for patient to have some labs drawn before his appointment with [name] on 04/22/2020. She states that patient is having fluid in his legs, scrotum (size of grapefruit), and has gained about 5 lbs. She states that he is still taking his fluid pills but does not seem to be helping much. She states that he finishes radiation on Monday. She would like a call back at [phone].   Dr. Raliegh Ip-  That fluid overload is a cardiac problem in my opinion.  They should immediately reach out to Dr. Myles Gip offfice and seek his opinion.   Patients wife informed of Dr. Marthann Schiller response. She states understanding and will reach out to Dr. Myles Gip office concerning fluid. Patient's wife wants to know if they should reschedule the appointment with Dr. Raliegh Ip. She states that he missed it due to insurance not covering his PET scan.

## 2020-04-11 NOTE — Patient Instructions (Addendum)
Medication Instructions:   INCREASE Torsemide to 40 mg TWICE A DAY,  until you see Dr.McDowell next Thursday 2/24   DECREASE Amiodarone to 200 mg daily   START Aspirin 81 mg daily   *If you need a refill on your cardiac medications before your next appointment, please call your pharmacy*   Lab Work: BMET  Get lab work on Monday  If you have labs (blood work) drawn today and your tests are completely normal, you will receive your results only by: [name] MyChart Message (if you have MyChart) OR . A paper copy in the mail If you have any lab test that is abnormal or we need to change your treatment, we will call you to review the results.   Testing/Procedures: None today   Follow-Up: At [facility], you and your health needs are our priority.  As part of our continuing mission to provide you with exceptional heart care, we have created designated Provider Care Teams.  These Care Teams include your primary Cardiologist (physician) and Advanced Practice Providers (APPs -  Physician Assistants and Nurse Practitioners) who all work together to provide you with the care you need, when you need it.  We recommend signing up for the patient portal called "MyChart".  Sign up information is provided on this After Visit Summary.  MyChart is used to connect with patients for Virtual Visits (Telemedicine).  Patients are able to view lab/test results, encounter notes, upcoming appointments, etc.  Non-urgent messages can be sent to your provider as well.   To learn more about what you can do with MyChart, go to [email or web address].    Your next appointment:   Thursday, 04/17/20 at 940 am with Dr.McDowell  The format for your next appointment:   In Person  Provider:   Rozann Lesches, MD   Other Instructions None    Thank you for choosing [facility] !

## 2020-04-11 NOTE — Telephone Encounter (Signed)
Dr.Nishan will see in office today,left message for wife to confirm

## 2020-04-11 NOTE — Telephone Encounter (Signed)
Spoke to patients spouse. She states that patients weight has continuously stayed between 180-190 lbs this week. She stated that his weight is 188 lbs as of now. He is also having episodes of SOB. Will forward to DOD

## 2020-04-11 NOTE — Telephone Encounter (Signed)
New message    Pt c/o swelling: STAT is pt has developed SOB within 24 hours  1) How much weight have you gained and in what time span?  8lbs in the last couple days   2) If swelling, where is the swelling located? Legs and scrotum   3) Are you currently taking a fluid pill?  yes  4) Are you currently SOB?  Yes   5) Do you have a log of your daily weights (if so, list)? yes  6) Have you gained 3 pounds in a day or 5 pounds in a week? 2/13-2/18 180 -190 today he is at 188  7) Have you traveled recently? no

## 2020-04-11 NOTE — Progress Notes (Signed)
CARDIOLOGY CONSULT NOTE       Patient ID: Richard [name] MRN: [id number] DOB/AGE: 09-18-1945 75 y.o.   Referring Physician: Delton Coombes Primary Physician: Wannetta Sender, FNP Primary Cardiologist: Domenic Polite Reason for Consultation: CHF/Edema    HPI:  75 y.o. added on to schedule due to 8 lb weight gain, scrotal edema and worsening CHF. History of CAD with DES to RCA 2007, mid LAD and OM2 2019. 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 Has had atrial arrhythmias with MAT, flutter ablation 2008. Quit smoking in 2019 He has had TURBT for bladder cancer August 2021 has had hematuria Baseline diuretic are demadex 20 mg bid Baseline Cr 2.0 last done 03/27/20 Seen by Dr Harl Bowie in consult for afib But ? MAT was on lasix 40 iv bid in hospital   Echo 01/24/20 EF 30-35% mild MR AV sclerosis started on amiodarone was to taper from loading dose 400 bid to 200 mg daily Not anticoagulated due to hematuria and bladder cancer Also on Toprol 100 gm daily Not on ACE/ARB/ARNI due to renal failure DAPT stopped for CAD due to hematuria and no ischemic evaluation Planned due to active Rx of bladder cancer   His ECG from 03/19/20 showed more MAT/Atrial tachycardia not afib with LVH and sinus rhythm   He indicates that his weight has been going up since they "flooded" him with fluid to clear hematuria After Dr Jeffie Pollock did clot evac TURBTR and right stent removal urine has been clear   Long discussion and he does not want to be admitted will try demedex 40 bid and see if he diuresis At all over weekend He will be here for his last XRT Rx Monday and is willing to be admitted if no better Lab is closed and cannot get BMET today   Wife of 47 years very attentive and former OR nurse at [employer]    ROS All other systems reviewed and negative except as noted above  Past Medical History:  Diagnosis Date  . Anemia 12/2019  . Bladder cancer ([place])   . CAD (coronary artery disease)    a. 2007 - DES x 2 proximal to mid RCA  b. 09/2017: DES to mid LAD and OM2. Patent stents along RCA.   [name] CHF (congestive heart failure) ([place])   . Essential hypertension   . History of atrial flutter    Status post ablation 2008 - Dr. Caryl Comes  . History of transient ischemic attack (TIA)   . Mixed hyperlipidemia    Statin intolerance  . Pneumonia   . Type 2 diabetes mellitus (HCC)    A1C 11.5 06/2017    Family History  Problem Relation Age of Onset  . Diabetes Mother   . Heart disease Mother   . Heart disease Brother   . Lung cancer Brother   . Emphysema Father   . Lung cancer Brother        x 2  . Lupus Daughter   . Colon cancer Neg Hx   . Esophageal cancer Neg Hx   . Rectal cancer Neg Hx   . Stomach cancer Neg Hx   . Prostate cancer Neg Hx     Social History   Socioeconomic History  . Marital status: Married    Spouse name: Not on file  . Number of children: 4  . Years of education: Not on file  . Highest education level: Not on file  Occupational History  . Occupation: truck Geophysicist/field seismologist    Comment: retired  Tobacco  Use  . Smoking status: Current Every Day Smoker    Packs/day: 1.00    Years: 54.00    Pack years: 54.00    Types: Cigarettes  . Smokeless tobacco: Never Used  Vaping Use  . Vaping Use: Never used  Substance and Sexual Activity  . Alcohol use: No    Alcohol/week: 0.0 standard drinks    Comment: rare  . Drug use: No  . Sexual activity: Not Currently  Other Topics Concern  . Not on file  Social History Narrative  . Not on file   Social Determinants of Health   Financial Resource Strain: Low Risk   . Difficulty of Paying Living Expenses: Not hard at all  Food Insecurity: No Food Insecurity  . Worried About Charity fundraiser in the Last Year: Never true  . Ran Out of Food in the Last Year: Never true  Transportation Needs: No Transportation Needs  . Lack of Transportation (Medical): No  . Lack of Transportation (Non-Medical): No  Physical Activity: Inactive  . Days of Exercise per  Week: 0 days  . Minutes of Exercise per Session: 0 min  Stress: No Stress Concern Present  . Feeling of Stress : Not at all  Social Connections: Moderately Isolated  . Frequency of Communication with Friends and Family: Three times a week  . Frequency of Social Gatherings with Friends and Family: Three times a week  . Attends Religious Services: Never  . Active Member of Clubs or Organizations: No  . Attends Archivist Meetings: Never  . Marital Status: Married  Human resources officer Violence: Not At Risk  . Fear of Current or Ex-Partner: No  . Emotionally Abused: No  . Physically Abused: No  . Sexually Abused: No    Past Surgical History:  Procedure Laterality Date  . BALLOON ANGIOPLASTY, ARTERY    . BICEPS TENDON REPAIR Left   . CARDIAC ELECTROPHYSIOLOGY MAPPING AND ABLATION    . CARPAL TUNNEL RELEASE Bilateral   . cervical neck fusion     x 4  . CORONARY ANGIOPLASTY    . CORONARY STENT INTERVENTION N/A 10/17/2017   Procedure: CORONARY STENT INTERVENTION;  Surgeon: Martinique, Antoinne Spadaccini M, MD;  Location: [place] [facility];  Service: Cardiovascular;  Laterality: N/A;  . CORONARY STENT PLACEMENT     x 2  . CYSTOSCOPY W/ RETROGRADES Bilateral 10/08/2019   Procedure: CYSTOSCOPY WITH BILATERAL RETROGRADE PYELOGRAM;  Surgeon: Cleon Gustin, MD;  Location: [facility];  Service: Urology;  Laterality: Bilateral;  . CYSTOSCOPY W/ URETERAL STENT PLACEMENT Bilateral 01/26/2020   Procedure: CYSTOSCOPY WITH RETROGRADE PYELOGRAM bilateral right ureter stent removal;  Surgeon: Irine Seal, MD;  Location: [facility];  Service: Urology;  Laterality: Bilateral;  . CYSTOSCOPY WITH FULGERATION N/A 01/26/2020   Procedure: CYSTOSCOPY WITH FULGERATION, CLOT EVACUATION transurethral resection bladder tumor;  Surgeon: Irine Seal, MD;  Location: [facility];  Service: Urology;  Laterality: N/A;  . CYSTOSCOPY WITH URETHRAL DILATATION  10/08/2019   Procedure: CYSTOSCOPY WITH URETHRAL DILATATION;  Surgeon: Cleon Gustin, MD;  Location: [facility];  Service: Urology;;  . LEFT HEART CATH AND CORONARY ANGIOGRAPHY N/A 10/17/2017   Procedure: LEFT HEART CATH AND CORONARY ANGIOGRAPHY;  Surgeon: Martinique, Christalynn Boise M, MD;  Location: [place] [facility];  Service: Cardiovascular;  Laterality: N/A;  . ROTATOR CUFF REPAIR Right    x 4  . TIBIA FRACTURE SURGERY Left   . TRANSURETHRAL RESECTION OF BLADDER TUMOR N/A 10/08/2019   Procedure: TRANSURETHRAL RESECTION OF  BLADDER TUMOR (TURBT);  Surgeon: Cleon Gustin, MD;  Location: [facility];  Service: Urology;  Laterality: N/A;      Current Outpatient Medications:  .  acetaminophen (TYLENOL) 325 MG tablet, Take 2 tablets (650 mg total) by mouth every 6 (six) hours as needed for mild pain (or Fever >/= 101)., Disp: 12 tablet, Rfl: 0 .  ALPRAZolam (XANAX) 0.5 MG tablet, Take 0.5 mg by mouth 2 (two) times daily as needed., Disp: , Rfl:  .  amiodarone (PACERONE) 400 MG tablet, Take 1 tablet (400 mg total) by mouth 2 (two) times daily., Disp: 60 tablet, Rfl: 0 .  atorvastatin (LIPITOR) 10 MG tablet, Take 10 mg by mouth daily., Disp: , Rfl:  .  B-D ULTRAFINE III SHORT PEN 31G X 8 MM MISC, Inject into the skin., Disp: , Rfl:  .  glipiZIDE (GLUCOTROL XL) 10 MG 24 hr tablet, Take 10 mg by mouth 2 (two) times daily., Disp: , Rfl:  .  hydrALAZINE (APRESOLINE) 25 MG tablet, Take 1 tablet (25 mg total) by mouth 2 (two) times daily., Disp: 60 tablet, Rfl: 2 .  isosorbide mononitrate (IMDUR) 30 MG 24 hr tablet, Take 1 tablet (30 mg total) by mouth at bedtime., Disp: 30 tablet, Rfl: 6 .  metoprolol succinate (TOPROL-XL) 100 MG 24 hr tablet, Take 1 tablet (100 mg total) by mouth daily. Take with or immediately following a meal., Disp: 60 tablet, Rfl: 3 .  nitroGLYCERIN (NITROSTAT) 0.4 MG SL tablet, Place 1 tablet (0.4 mg total) under the tongue every 5 (five) minutes as needed for chest pain., Disp: 25 tablet, Rfl: 3 .  Oxycodone HCl 10 MG TABS, Take 1 tablet (10 mg total) by mouth 4 (four)  times daily as needed., Disp: 60 tablet, Rfl: 0 .  torsemide (DEMADEX) 20 MG tablet, Take 40 mg by mouth daily., Disp: , Rfl:     Physical Exam: Pulse (!) 52, height 6' (1.829 m), weight 81.6 kg, SpO2 97 %.   Filed Weights   04/11/20 1535  Weight: 81.6 kg    Affect appropriate Chronically ill male  HEENT: normal Neck supple with no adenopathy JVP elevated  no bruits no thyromegaly Lungs clear with no wheezing and good diaphragmatic motion Heart:  S1/S2 no murmur, no rub, gallop or click PMI increased  Abdomen: benighn, BS positve, no tenderness, no AAA no bruit.  No HSM or HJR Distal pulses intact with no bruits Edema to thighs and scrotal edema  Neuro non-focal Skin warm and dry No muscular weakness   Labs:   Lab Results  Component Value Date   WBC 6.8 03/27/2020   HGB 10.2 (L) 03/27/2020   HCT 33.5 (L) 03/27/2020   MCV 86.3 03/27/2020   PLT 254 03/27/2020   No results for input(s): NA, K, CL, CO2, BUN, CREATININE, CALCIUM, PROT, BILITOT, ALKPHOS, ALT, AST, GLUCOSE in the last 168 hours.  Invalid input(s): LABALBU Lab Results  Component Value Date   CKTOTAL 115 06/28/2017   CKMB 2.9 09/23/2010   TROPONINI 1.43 (HH) 10/19/2017    Lab Results  Component Value Date   CHOL 157 09/16/2019   CHOL 262 (H) 06/28/2017   CHOL  01/05/2007    113        ATP III CLASSIFICATION:  <200     mg/dL   Desirable  200-239  mg/dL   Borderline High  >=240    mg/dL   High   Lab Results  Component Value Date   HDL 39 (  L) 09/16/2019   HDL 39 (L) 06/28/2017   HDL 40 01/05/2007   Lab Results  Component Value Date   LDLCALC 94 09/16/2019   LDLCALC 191 (H) 06/28/2017   [place]  01/05/2007    58        Total Cholesterol/HDL:CHD Risk Coronary Heart Disease Risk Table                     Men   Women  1/2 Average Risk   3.4   3.3   Lab Results  Component Value Date   TRIG 121 09/16/2019   TRIG 159 (H) 06/28/2017   TRIG 73 01/05/2007   Lab Results  Component Value Date    CHOLHDL 4.0 09/16/2019   CHOLHDL 6.7 06/28/2017   CHOLHDL 2.8 01/05/2007   No results found for: LDLDIRECT    Radiology: DG Chest 2 View  Result Date: 03/21/2020 CLINICAL DATA:  Shortness of breath, lower extremity swelling, history diabetes mellitus, CHF, coronary artery disease, smoker, bladder cancer; COVID-19 negative on 03/19/2020 EXAM: CHEST - 2 VIEW COMPARISON:  01/25/2020 FINDINGS: Upper normal heart size. Slight pulmonary vascular congestion. Atherosclerotic calcification aorta. Increased interstitial markings throughout both lung since previous exam, question pulmonary edema, though atypical infection not completely excluded. Tiny bibasilar effusions. No pneumothorax. Bones demineralized with BILATERAL glenohumeral degenerative changes and evidence of prior cervical spine fusion. IMPRESSION: Diffuse interstitial infiltrates bilaterally favor pulmonary edema as above. Tiny bibasilar pleural effusions. Electronically Signed   By: Lavonia Dana M.D.   On: 03/21/2020 08:27   MR Lumbar Spine W Wo Contrast  Result Date: 03/27/2020 CLINICAL DATA:  Malignant neoplasm of bladder.  Low back pain EXAM: MRI LUMBAR SPINE WITHOUT AND WITH CONTRAST TECHNIQUE: Multiplanar and multiecho pulse sequences of the lumbar spine were obtained without and with intravenous contrast. CONTRAST:  88mL GADAVIST GADOBUTROL 1 MMOL/ML IV SOLN COMPARISON:  MRI lumbar spine 11/23/2007. CT abdomen pelvis 01/25/2020 FINDINGS: Segmentation:  Normal Alignment:  Normal Vertebrae: Large lesion left posterior iliac bone measuring approximately 6.0 x 5.3 cm. This shows edema and enhancement and is compatible with metastatic disease. There is a small 1 cm lesion in this area on the recent CT with dramatic growth in this lesion. No other bone lesions identified. Negative for fracture. Conus medullaris and cauda equina: Conus extends to the L1-2 level. Conus and cauda equina appear normal. Paraspinal and other soft tissues: Negative for  retroperitoneal mass or adenopathy. Disc levels: L1-2: Mild disc degeneration.  Negative for stenosis. L2-3: Mild disc degeneration.  Negative for stenosis L3-4: Mild disc degeneration.  Negative for stenosis. L4-5: Disc bulging and facet degeneration. Mild spinal stenosis and mild subarticular stenosis bilaterally. L5-S1: Shallow central disc protrusion and mild facet degeneration. Negative for stenosis. IMPRESSION: Large mass lesion left posterior iliac bone compatible metastatic disease with marked interval growth since 01/25/2020. No other metastatic disease identified. Mild lumbar degenerative changes above. Electronically Signed   By: Franchot Gallo M.D.   On: 03/27/2020 21:48    EKG: MAT LVH nonspecific ST changes 03/19/20   ASSESSMENT AND PLAN:   1. Chronic Systolic CHF:  Complicated by bladder CA and likely poor lymphatic drainage now Increase demedex to 40 PO bid Check BMET Monday has f/u with Dr Domenic Polite Thursday. He knows to come to ER for admission if he has worse dyspnea, orthopnea renal output and will need inpatient iv diuresis  2. Arrhythmias:  Improved decrease amiodarone to 200 mg daily has been loaded  3. Bladder cancer : ?  Metastatic insurance has not approved PET scan yet but needs it to stage. 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

## 2020-04-14 ENCOUNTER — Inpatient Hospital Stay (HOSPITAL_COMMUNITY)
Admission: EM | Admit: 2020-04-14 | Discharge: 2020-04-20 | DRG: 291 | Disposition: A | Discharge status: Home / Self Care | Payer: Medicare Other | Attending: Family Medicine | Admitting: Family Medicine

## 2020-04-14 ENCOUNTER — Emergency Department (HOSPITAL_COMMUNITY): Payer: Medicare Other

## 2020-04-14 ENCOUNTER — Other Ambulatory Visit: Payer: Self-pay

## 2020-04-14 ENCOUNTER — Encounter (HOSPITAL_COMMUNITY): Payer: Self-pay | Admitting: *Deleted

## 2020-04-14 ENCOUNTER — Ambulatory Visit
Admission: RE | Admit: 2020-04-14 | Discharge: 2020-04-14 | Disposition: A | Discharge status: Home / Self Care | Payer: Medicare Other | Source: Ambulatory Visit | Attending: Radiation Oncology | Admitting: Radiation Oncology

## 2020-04-14 ENCOUNTER — Encounter: Payer: Self-pay | Admitting: Radiation Oncology

## 2020-04-14 ENCOUNTER — Telehealth: Payer: Self-pay | Admitting: Cardiology

## 2020-04-14 ENCOUNTER — Telehealth: Payer: Self-pay

## 2020-04-14 DIAGNOSIS — D631 Anemia in chronic kidney disease: Secondary | ICD-10-CM | POA: Diagnosis present

## 2020-04-14 DIAGNOSIS — I10 Essential (primary) hypertension: Secondary | ICD-10-CM | POA: Diagnosis present

## 2020-04-14 DIAGNOSIS — I2510 Atherosclerotic heart disease of native coronary artery without angina pectoris: Secondary | ICD-10-CM | POA: Diagnosis not present

## 2020-04-14 DIAGNOSIS — N1832 Chronic kidney disease, stage 3b: Secondary | ICD-10-CM | POA: Diagnosis present

## 2020-04-14 DIAGNOSIS — Z955 Presence of coronary angioplasty implant and graft: Secondary | ICD-10-CM | POA: Diagnosis not present

## 2020-04-14 DIAGNOSIS — Z20822 Contact with and (suspected) exposure to covid-19: Secondary | ICD-10-CM | POA: Diagnosis present

## 2020-04-14 DIAGNOSIS — I130 Hypertensive heart and chronic kidney disease with heart failure and stage 1 through stage 4 chronic kidney disease, or unspecified chronic kidney disease: Principal | ICD-10-CM | POA: Diagnosis present

## 2020-04-14 DIAGNOSIS — I509 Heart failure, unspecified: Secondary | ICD-10-CM | POA: Insufficient documentation

## 2020-04-14 DIAGNOSIS — E782 Mixed hyperlipidemia: Secondary | ICD-10-CM | POA: Diagnosis present

## 2020-04-14 DIAGNOSIS — I5023 Acute on chronic systolic (congestive) heart failure: Secondary | ICD-10-CM | POA: Diagnosis present

## 2020-04-14 DIAGNOSIS — Z7984 Long term (current) use of oral hypoglycemic drugs: Secondary | ICD-10-CM

## 2020-04-14 DIAGNOSIS — M79604 Pain in right leg: Secondary | ICD-10-CM | POA: Diagnosis not present

## 2020-04-14 DIAGNOSIS — N5089 Other specified disorders of the male genital organs: Secondary | ICD-10-CM | POA: Diagnosis present

## 2020-04-14 DIAGNOSIS — E1122 Type 2 diabetes mellitus with diabetic chronic kidney disease: Secondary | ICD-10-CM | POA: Diagnosis present

## 2020-04-14 DIAGNOSIS — I483 Typical atrial flutter: Secondary | ICD-10-CM | POA: Diagnosis not present

## 2020-04-14 DIAGNOSIS — Z8673 Personal history of transient ischemic attack (TIA), and cerebral infarction without residual deficits: Secondary | ICD-10-CM | POA: Diagnosis not present

## 2020-04-14 DIAGNOSIS — C679 Malignant neoplasm of bladder, unspecified: Secondary | ICD-10-CM | POA: Diagnosis present

## 2020-04-14 DIAGNOSIS — I4892 Unspecified atrial flutter: Secondary | ICD-10-CM | POA: Diagnosis present

## 2020-04-14 DIAGNOSIS — R600 Localized edema: Secondary | ICD-10-CM | POA: Diagnosis not present

## 2020-04-14 DIAGNOSIS — I255 Ischemic cardiomyopathy: Secondary | ICD-10-CM | POA: Diagnosis not present

## 2020-04-14 DIAGNOSIS — Z981 Arthrodesis status: Secondary | ICD-10-CM

## 2020-04-14 DIAGNOSIS — E46 Unspecified protein-calorie malnutrition: Secondary | ICD-10-CM | POA: Diagnosis present

## 2020-04-14 DIAGNOSIS — E1165 Type 2 diabetes mellitus with hyperglycemia: Secondary | ICD-10-CM | POA: Diagnosis present

## 2020-04-14 DIAGNOSIS — R0602 Shortness of breath: Secondary | ICD-10-CM | POA: Diagnosis not present

## 2020-04-14 DIAGNOSIS — Z6826 Body mass index (BMI) 26.0-26.9, adult: Secondary | ICD-10-CM

## 2020-04-14 DIAGNOSIS — F17210 Nicotine dependence, cigarettes, uncomplicated: Secondary | ICD-10-CM | POA: Diagnosis present

## 2020-04-14 DIAGNOSIS — Z7982 Long term (current) use of aspirin: Secondary | ICD-10-CM

## 2020-04-14 DIAGNOSIS — Z515 Encounter for palliative care: Secondary | ICD-10-CM | POA: Diagnosis not present

## 2020-04-14 DIAGNOSIS — Z79899 Other long term (current) drug therapy: Secondary | ICD-10-CM | POA: Diagnosis not present

## 2020-04-14 DIAGNOSIS — Z888 Allergy status to other drugs, medicaments and biological substances status: Secondary | ICD-10-CM

## 2020-04-14 DIAGNOSIS — C671 Malignant neoplasm of dome of bladder: Secondary | ICD-10-CM | POA: Diagnosis not present

## 2020-04-14 DIAGNOSIS — Z7189 Other specified counseling: Secondary | ICD-10-CM | POA: Diagnosis not present

## 2020-04-14 DIAGNOSIS — R609 Edema, unspecified: Secondary | ICD-10-CM

## 2020-04-14 DIAGNOSIS — I480 Paroxysmal atrial fibrillation: Secondary | ICD-10-CM | POA: Diagnosis not present

## 2020-04-14 DIAGNOSIS — Z8249 Family history of ischemic heart disease and other diseases of the circulatory system: Secondary | ICD-10-CM

## 2020-04-14 DIAGNOSIS — J90 Pleural effusion, not elsewhere classified: Secondary | ICD-10-CM | POA: Diagnosis not present

## 2020-04-14 DIAGNOSIS — Z801 Family history of malignant neoplasm of trachea, bronchus and lung: Secondary | ICD-10-CM

## 2020-04-14 DIAGNOSIS — I878 Other specified disorders of veins: Secondary | ICD-10-CM | POA: Diagnosis present

## 2020-04-14 DIAGNOSIS — I420 Dilated cardiomyopathy: Secondary | ICD-10-CM | POA: Diagnosis present

## 2020-04-14 DIAGNOSIS — N179 Acute kidney failure, unspecified: Secondary | ICD-10-CM | POA: Diagnosis not present

## 2020-04-14 DIAGNOSIS — G479 Sleep disorder, unspecified: Secondary | ICD-10-CM | POA: Diagnosis present

## 2020-04-14 DIAGNOSIS — I493 Ventricular premature depolarization: Secondary | ICD-10-CM | POA: Diagnosis present

## 2020-04-14 DIAGNOSIS — I5043 Acute on chronic combined systolic (congestive) and diastolic (congestive) heart failure: Secondary | ICD-10-CM | POA: Diagnosis not present

## 2020-04-14 DIAGNOSIS — Z716 Tobacco abuse counseling: Secondary | ICD-10-CM | POA: Diagnosis not present

## 2020-04-14 DIAGNOSIS — M898X9 Other specified disorders of bone, unspecified site: Secondary | ICD-10-CM | POA: Diagnosis not present

## 2020-04-14 DIAGNOSIS — R54 Age-related physical debility: Secondary | ICD-10-CM | POA: Diagnosis present

## 2020-04-14 DIAGNOSIS — E119 Type 2 diabetes mellitus without complications: Secondary | ICD-10-CM

## 2020-04-14 DIAGNOSIS — N189 Chronic kidney disease, unspecified: Secondary | ICD-10-CM | POA: Diagnosis not present

## 2020-04-14 DIAGNOSIS — Z833 Family history of diabetes mellitus: Secondary | ICD-10-CM

## 2020-04-14 DIAGNOSIS — R001 Bradycardia, unspecified: Secondary | ICD-10-CM | POA: Diagnosis not present

## 2020-04-14 DIAGNOSIS — Z825 Family history of asthma and other chronic lower respiratory diseases: Secondary | ICD-10-CM

## 2020-04-14 LAB — URINALYSIS, ROUTINE W REFLEX MICROSCOPIC
Bacteria, UA: NONE SEEN
Bilirubin Urine: NEGATIVE
Glucose, UA: NEGATIVE mg/dL
Ketones, ur: NEGATIVE mg/dL
Leukocytes,Ua: NEGATIVE
Nitrite: NEGATIVE
Protein, ur: 100 mg/dL — AB
Specific Gravity, Urine: 1.008 (ref 1.005–1.030)
pH: 5 (ref 5.0–8.0)

## 2020-04-14 LAB — CBC WITH DIFFERENTIAL/PLATELET
Abs Immature Granulocytes: 0.02 10*3/uL (ref 0.00–0.07)
Basophils Absolute: 0 10*3/uL (ref 0.0–0.1)
Basophils Relative: 0 %
Eosinophils Absolute: 0 10*3/uL (ref 0.0–0.5)
Eosinophils Relative: 1 %
HCT: 31.9 % — ABNORMAL LOW (ref 39.0–52.0)
Hemoglobin: 9.8 g/dL — ABNORMAL LOW (ref 13.0–17.0)
Immature Granulocytes: 0 %
Lymphocytes Relative: 6 %
Lymphs Abs: 0.3 10*3/uL — ABNORMAL LOW (ref 0.7–4.0)
MCH: 27.1 pg (ref 26.0–34.0)
MCHC: 30.7 g/dL (ref 30.0–36.0)
MCV: 88.4 fL (ref 80.0–100.0)
Monocytes Absolute: 0.5 10*3/uL (ref 0.1–1.0)
Monocytes Relative: 12 %
Neutro Abs: 3.6 10*3/uL (ref 1.7–7.7)
Neutrophils Relative %: 81 %
Platelets: 183 10*3/uL (ref 150–400)
RBC: 3.61 MIL/uL — ABNORMAL LOW (ref 4.22–5.81)
RDW: 21.4 % — ABNORMAL HIGH (ref 11.5–15.5)
WBC: 4.5 10*3/uL (ref 4.0–10.5)
nRBC: 0 % (ref 0.0–0.2)

## 2020-04-14 LAB — COMPREHENSIVE METABOLIC PANEL
ALT: 41 U/L (ref 0–44)
AST: 27 U/L (ref 15–41)
Albumin: 2.8 g/dL — ABNORMAL LOW (ref 3.5–5.0)
Alkaline Phosphatase: 213 U/L — ABNORMAL HIGH (ref 38–126)
Anion gap: 9 (ref 5–15)
BUN: 48 mg/dL — ABNORMAL HIGH (ref 8–23)
CO2: 29 mmol/L (ref 22–32)
Calcium: 8.6 mg/dL — ABNORMAL LOW (ref 8.9–10.3)
Chloride: 95 mmol/L — ABNORMAL LOW (ref 98–111)
Creatinine, Ser: 1.73 mg/dL — ABNORMAL HIGH (ref 0.61–1.24)
GFR, Estimated: 41 mL/min — ABNORMAL LOW (ref >60)
Glucose, Bld: 183 mg/dL — ABNORMAL HIGH (ref 70–99)
Potassium: 3.6 mmol/L (ref 3.5–5.1)
Sodium: 133 mmol/L — ABNORMAL LOW (ref 135–145)
Total Bilirubin: 0.5 mg/dL (ref 0.3–1.2)
Total Protein: 6.4 g/dL — ABNORMAL LOW (ref 6.5–8.1)

## 2020-04-14 LAB — BRAIN NATRIURETIC PEPTIDE: B Natriuretic Peptide: 1772 pg/mL — ABNORMAL HIGH (ref 0.0–100.0)

## 2020-04-14 LAB — TROPONIN I (HIGH SENSITIVITY)
Troponin I (High Sensitivity): 20 ng/L — ABNORMAL HIGH (ref <18)
Troponin I (High Sensitivity): 21 ng/L — ABNORMAL HIGH (ref <18)

## 2020-04-14 LAB — CBG MONITORING, ED: Glucose-Capillary: 167 mg/dL — ABNORMAL HIGH (ref 70–99)

## 2020-04-14 LAB — RESP PANEL BY RT-PCR (FLU A&B, COVID) ARPGX2
Influenza A by PCR: NEGATIVE
Influenza B by PCR: NEGATIVE
SARS Coronavirus 2 by RT PCR: NEGATIVE

## 2020-04-14 MED ORDER — ASPIRIN EC 81 MG PO TBEC
81.0000 mg | DELAYED_RELEASE_TABLET | Freq: Every day | ORAL | Status: DC
  Administered 2020-04-15 – 2020-04-20 (×6): 81 mg via ORAL
  Filled 2020-04-14 (×6): qty 1

## 2020-04-14 MED ORDER — METOPROLOL SUCCINATE ER 50 MG PO TB24
100.0000 mg | ORAL_TABLET | Freq: Every day | ORAL | Status: DC
  Administered 2020-04-15 – 2020-04-17 (×3): 100 mg via ORAL
  Filled 2020-04-14 (×4): qty 2

## 2020-04-14 MED ORDER — FUROSEMIDE 10 MG/ML IJ SOLN
20.0000 mg | Freq: Once | INTRAMUSCULAR | Status: AC
  Administered 2020-04-14: 20 mg via INTRAVENOUS
  Filled 2020-04-14: qty 2

## 2020-04-14 MED ORDER — ONDANSETRON HCL 4 MG PO TABS: 4.0000 mg | ORAL_TABLET | Freq: Four times a day (QID) | ORAL | Status: DC | PRN

## 2020-04-14 MED ORDER — ACETAMINOPHEN 650 MG RE SUPP: 650.0000 mg | Freq: Four times a day (QID) | RECTAL | Status: DC | PRN

## 2020-04-14 MED ORDER — OXYCODONE HCL 5 MG PO TABS
10.0000 mg | ORAL_TABLET | ORAL | Status: DC | PRN
  Administered 2020-04-15 – 2020-04-20 (×8): 10 mg via ORAL
  Filled 2020-04-14 (×9): qty 2

## 2020-04-14 MED ORDER — AMIODARONE HCL 200 MG PO TABS
200.0000 mg | ORAL_TABLET | Freq: Every day | ORAL | Status: DC
  Administered 2020-04-15 – 2020-04-20 (×6): 200 mg via ORAL
  Filled 2020-04-14 (×6): qty 1

## 2020-04-14 MED ORDER — SILVER SULFADIAZINE 1 % EX CREA
TOPICAL_CREAM | Freq: Two times a day (BID) | CUTANEOUS | Status: DC
  Filled 2020-04-14: qty 85

## 2020-04-14 MED ORDER — ALPRAZOLAM 0.5 MG PO TABS
0.5000 mg | ORAL_TABLET | Freq: Two times a day (BID) | ORAL | Status: DC | PRN
  Administered 2020-04-15 – 2020-04-19 (×5): 0.5 mg via ORAL
  Filled 2020-04-14 (×5): qty 1

## 2020-04-14 MED ORDER — FUROSEMIDE 10 MG/ML IJ SOLN
40.0000 mg | Freq: Two times a day (BID) | INTRAMUSCULAR | Status: DC
  Administered 2020-04-15 – 2020-04-19 (×9): 40 mg via INTRAVENOUS
  Filled 2020-04-14 (×9): qty 4

## 2020-04-14 MED ORDER — MORPHINE SULFATE (PF) 2 MG/ML IV SOLN
2.0000 mg | INTRAVENOUS | Status: DC | PRN
  Administered 2020-04-14 – 2020-04-17 (×3): 2 mg via INTRAVENOUS
  Filled 2020-04-14 (×3): qty 1

## 2020-04-14 MED ORDER — ISOSORBIDE MONONITRATE ER 60 MG PO TB24
30.0000 mg | ORAL_TABLET | Freq: Every day | ORAL | Status: DC
  Administered 2020-04-14 – 2020-04-17 (×4): 30 mg via ORAL
  Filled 2020-04-14 (×3): qty 1

## 2020-04-14 MED ORDER — NICOTINE 14 MG/24HR TD PT24
14.0000 mg | MEDICATED_PATCH | Freq: Every day | TRANSDERMAL | Status: DC
  Administered 2020-04-15 – 2020-04-20 (×6): 14 mg via TRANSDERMAL
  Filled 2020-04-14 (×6): qty 1

## 2020-04-14 MED ORDER — ACETAMINOPHEN 325 MG PO TABS
650.0000 mg | ORAL_TABLET | Freq: Four times a day (QID) | ORAL | Status: DC | PRN
  Administered 2020-04-17 – 2020-04-19 (×3): 650 mg via ORAL
  Filled 2020-04-14 (×4): qty 2

## 2020-04-14 MED ORDER — ONDANSETRON HCL 4 MG/2ML IJ SOLN: 4.0000 mg | Freq: Four times a day (QID) | INTRAMUSCULAR | Status: DC | PRN

## 2020-04-14 MED ORDER — HEPARIN SODIUM (PORCINE) 5000 UNIT/ML IJ SOLN
5000.0000 [IU] | Freq: Three times a day (TID) | INTRAMUSCULAR | Status: DC
  Administered 2020-04-14 – 2020-04-20 (×17): 5000 [IU] via SUBCUTANEOUS
  Filled 2020-04-14 (×17): qty 1

## 2020-04-14 MED ORDER — POLYETHYLENE GLYCOL 3350 17 G PO PACK: 17.0000 g | PACK | Freq: Every day | ORAL | Status: DC | PRN

## 2020-04-14 MED ORDER — INSULIN ASPART 100 UNIT/ML ~~LOC~~ SOLN
0.0000 [IU] | Freq: Three times a day (TID) | SUBCUTANEOUS | Status: DC
  Administered 2020-04-15: 3 [IU] via SUBCUTANEOUS
  Administered 2020-04-15: 5 [IU] via SUBCUTANEOUS
  Administered 2020-04-16 – 2020-04-17 (×2): 3 [IU] via SUBCUTANEOUS
  Administered 2020-04-17: 2 [IU] via SUBCUTANEOUS
  Administered 2020-04-18: 3 [IU] via SUBCUTANEOUS
  Administered 2020-04-18 (×2): 2 [IU] via SUBCUTANEOUS
  Administered 2020-04-19 (×2): 3 [IU] via SUBCUTANEOUS
  Administered 2020-04-20 (×2): 2 [IU] via SUBCUTANEOUS

## 2020-04-14 MED ORDER — HYDRALAZINE HCL 25 MG PO TABS
25.0000 mg | ORAL_TABLET | Freq: Two times a day (BID) | ORAL | Status: DC
  Administered 2020-04-14 – 2020-04-15 (×2): 25 mg via ORAL
  Filled 2020-04-14 (×2): qty 1

## 2020-04-14 MED ORDER — INSULIN ASPART 100 UNIT/ML ~~LOC~~ SOLN
0.0000 [IU] | Freq: Every day | SUBCUTANEOUS | Status: DC
  Administered 2020-04-17: 2 [IU] via SUBCUTANEOUS

## 2020-04-14 MED ORDER — ATORVASTATIN CALCIUM 10 MG PO TABS
10.0000 mg | ORAL_TABLET | Freq: Every day | ORAL | Status: DC
  Administered 2020-04-15 – 2020-04-20 (×6): 10 mg via ORAL
  Filled 2020-04-14 (×6): qty 1

## 2020-04-14 NOTE — ED Provider Notes (Signed)
[facility] EMERGENCY DEPARTMENT Provider Note   CSN: [id number] Arrival date & time: 04/14/20  1524     History Chief Complaint  Patient presents with  . Leg Swelling    Richard [name] is a 75 y.o. male.  HPI   Patient with significant medical history of CAD, bladder cancer currently on chemotherapy, CHF EF of 30 to 35%, A. fib, diabetes presents with chief complaint of worsening swelling.  Patient endorses over the last week he has had swelling that increased from his legs only up into his abdomen.  He states his testicles are now enlarged.  He states generally he does not have swelling in his testicles.  Patient states he has been taking torsemide 40 mg twice a day as prescribed, 4 pound weight gain, has no known fluid restrictions, he states he has been urinating 3-4 times a day but states it is not a lot.   Patient admits to chronic orthopnea and shortness of breath, he denies chest pain, abdominal pain, nausea, vomiting, diarrhea, dysuria, hematuria, penile discharge.  Patient endorses that he spoke with [name] of heart care who recommend that he come to the emergency department for further evaluation.  Patient denies any alleviating factors.  Patient denies headaches, fevers, chills, abdominal pain, nausea, vomiting, diarrhea.   Past Medical History:  Diagnosis Date  . Anemia 12/2019  . Bladder cancer ([place])   . CAD (coronary artery disease)    a. 2007 - DES x 2 proximal to mid RCA b. 09/2017: DES to mid LAD and OM2. Patent stents along RCA.   [name] CHF (congestive heart failure) ([place])   . Essential hypertension   . History of atrial flutter    Status post ablation 2008 - Dr. Caryl Comes  . History of transient ischemic attack (TIA)   . Mixed hyperlipidemia    Statin intolerance  . Pneumonia   . Type 2 diabetes mellitus ([place])    A1C 11.5 06/2017    Patient Active Problem List   Diagnosis Date Noted  . CHF exacerbation ([place]) 04/14/2020  . Normocytic anemia 03/19/2020  . CKD  (chronic kidney disease) stage 3, GFR 30-59 ml/min (HCC) 03/19/2020  . Atrial fibrillation with RVR ([place]) 03/19/2020  . Gross hematuria 01/23/2020  . Acute blood loss anemia 01/23/2020  . 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 Bladder mass 09/20/2019  . Hematuria 09/16/2019  . AKI (acute kidney injury) ([place]) 09/16/2019  . Pulmonary nodules 09/16/2019  . Cigarette nicotine dependence without complication 12/23/5943  . Hyperlipidemia associated with type 2 diabetes mellitus (HCC) - Statin intolerant 10/18/2017  . Unstable angina ([place])   . Chest pain 10/16/2017  . Statin intolerance 07/22/2017  . Stenosis of left carotid artery 07/22/2017  . Atrial flutter ([place]) - s/p ablation   . TIA (transient ischemic attack) 06/27/2017  . Medicare annual wellness visit, subsequent 01/04/2017  . Need for hepatitis C screening test 01/04/2017  . Chronic non-seasonal allergic rhinitis 04/08/2016  . Need for vaccination 04/08/2016  . DDD (degenerative disc disease), cervical 06/17/2015  . Essential hypertension 11/14/2012  . RLL pneumonia 11/14/2012  . Type 2 diabetes mellitus with complication, with long-term current use of insulin ([place]) 11/14/2012  . Coronary artery disease involving native coronary artery of native heart with unstable angina pectoris ([place]) 11/14/2012  . HIATAL HERNIA 11/24/1983    Past Surgical History:  Procedure Laterality Date  . BALLOON ANGIOPLASTY, ARTERY    . BICEPS TENDON REPAIR Left   .  CARDIAC ELECTROPHYSIOLOGY MAPPING AND ABLATION    . CARPAL TUNNEL RELEASE Bilateral   . cervical neck fusion     x 4  . CORONARY ANGIOPLASTY    . CORONARY STENT INTERVENTION N/A 10/17/2017   Procedure: CORONARY STENT INTERVENTION;  Surgeon: Martinique, Peter M, MD;  Location: [facility];  Service: Cardiovascular;  Laterality: N/A;  . CORONARY STENT PLACEMENT     x 2  . CYSTOSCOPY W/ RETROGRADES Bilateral 10/08/2019   Procedure: CYSTOSCOPY WITH BILATERAL  RETROGRADE PYELOGRAM;  Surgeon: Cleon Gustin, MD;  Location: [facility];  Service: Urology;  Laterality: Bilateral;  . CYSTOSCOPY W/ URETERAL STENT PLACEMENT Bilateral 01/26/2020   Procedure: CYSTOSCOPY WITH RETROGRADE PYELOGRAM bilateral right ureter stent removal;  Surgeon: Irine Seal, MD;  Location: [facility];  Service: Urology;  Laterality: Bilateral;  . CYSTOSCOPY WITH FULGERATION N/A 01/26/2020   Procedure: CYSTOSCOPY WITH FULGERATION, CLOT EVACUATION transurethral resection bladder tumor;  Surgeon: Irine Seal, MD;  Location: [facility];  Service: Urology;  Laterality: N/A;  . CYSTOSCOPY WITH URETHRAL DILATATION  10/08/2019   Procedure: CYSTOSCOPY WITH URETHRAL DILATATION;  Surgeon: Cleon Gustin, MD;  Location: [facility];  Service: Urology;;  . LEFT HEART CATH AND CORONARY ANGIOGRAPHY N/A 10/17/2017   Procedure: LEFT HEART CATH AND CORONARY ANGIOGRAPHY;  Surgeon: Martinique, Peter M, MD;  Location: [facility];  Service: Cardiovascular;  Laterality: N/A;  . ROTATOR CUFF REPAIR Right    x 4  . TIBIA FRACTURE SURGERY Left   . TRANSURETHRAL RESECTION OF BLADDER TUMOR N/A 10/08/2019   Procedure: TRANSURETHRAL RESECTION OF BLADDER TUMOR (TURBT);  Surgeon: Cleon Gustin, MD;  Location: [facility];  Service: Urology;  Laterality: N/A;       Family History  Problem Relation Age of Onset  . Diabetes Mother   . Heart disease Mother   . Heart disease Brother   . Lung cancer Brother   . Emphysema Father   . Lung cancer Brother        x 2  . Lupus Daughter   . Colon cancer Neg Hx   . Esophageal cancer Neg Hx   . Rectal cancer Neg Hx   . Stomach cancer Neg Hx   . Prostate cancer Neg Hx     Social History   Tobacco Use  . Smoking status: Current Every Day Smoker    Packs/day: 0.50    Years: 54.00    Pack years: 27.00    Types: Cigarettes  . Smokeless tobacco: Never Used  Vaping Use  . Vaping Use: Never used  Substance Use Topics  . Alcohol use: No    Alcohol/week: 0.0  standard drinks    Comment: rare  . Drug use: No    Home Medications Prior to Admission medications   Medication Sig Start Date End Date Taking? Authorizing Provider  acetaminophen (TYLENOL) 325 MG tablet Take 2 tablets (650 mg total) by mouth every 6 (six) hours as needed for mild pain (or Fever >/= 101). 03/23/20   Roxan Hockey, MD  ALPRAZolam Duanne Moron) 0.5 MG tablet Take 0.5 mg by mouth 2 (two) times daily as needed. 01/30/20   [provider]  amiodarone (PACERONE) 200 MG tablet Take 1 tablet (200 mg total) by mouth daily. 04/11/20   Josue Hector, MD  aspirin EC 81 MG tablet Take 1 tablet (81 mg total) by mouth daily. Swallow whole. 04/11/20   Josue Hector, MD  atorvastatin (LIPITOR) 10 MG tablet Take 10 mg by  mouth daily. 06/08/19   [provider]  B-D ULTRAFINE III SHORT PEN 31G X 8 MM MISC Inject into the skin. 09/18/19   [provider]  glipiZIDE (GLUCOTROL XL) 10 MG 24 hr tablet Take 10 mg by mouth 2 (two) times daily. 07/16/19   [provider]  hydrALAZINE (APRESOLINE) 25 MG tablet Take 1 tablet (25 mg total) by mouth 2 (two) times daily. 03/23/20 03/23/21  Roxan Hockey, MD  isosorbide mononitrate (IMDUR) 30 MG 24 hr tablet Take 1 tablet (30 mg total) by mouth at bedtime. 05/04/18   Satira Sark, MD  metoprolol succinate (TOPROL-XL) 100 MG 24 hr tablet Take 1 tablet (100 mg total) by mouth daily. Take with or immediately following a meal. 01/30/20   Almyra Deforest, PA  nitroGLYCERIN (NITROSTAT) 0.4 MG SL tablet Place 1 tablet (0.4 mg total) under the tongue every 5 (five) minutes as needed for chest pain. 07/09/19   Satira Sark, MD  Oxycodone HCl 10 MG TABS Take 1 tablet (10 mg total) by mouth 4 (four) times daily as needed. 04/11/20   Derek Jack, MD  torsemide (DEMADEX) 20 MG tablet Take 40 mg by mouth daily.    [provider]    Allergies    Statins  Review of Systems   Review of Systems  Constitutional:  Negative for chills and fever.  HENT: Negative for congestion and sore throat.   Respiratory: Positive for shortness of breath.   Cardiovascular: Positive for leg swelling. Negative for chest pain.  Gastrointestinal: Negative for abdominal pain, diarrhea, nausea and vomiting.  Genitourinary: Positive for decreased urine volume, scrotal swelling and testicular pain. Negative for enuresis.  Musculoskeletal: Negative for back pain.  Skin: Negative for rash.  Neurological: Negative for dizziness.  Hematological: Does not bruise/bleed easily.    Physical Exam Updated Vital Signs BP (!) 164/75   Pulse (!) 50   Temp 98.2 F (36.8 C) (Oral)   Resp 18   Ht 6' (1.829 m)   Wt 81.6 kg   SpO2 97%   BMI 24.41 kg/m   Physical Exam Vitals and nursing note reviewed.  Constitutional:      General: He is not in acute distress.    Appearance: He is not ill-appearing.  HENT:     Head: Normocephalic and atraumatic.     Nose: No congestion.  Eyes:     Conjunctiva/sclera: Conjunctivae normal.  Cardiovascular:     Rate and Rhythm: Normal rate and regular rhythm.     Pulses: Normal pulses.     Heart sounds: No murmur heard. No friction rub. No gallop.   Pulmonary:     Effort: No respiratory distress.     Breath sounds: No wheezing, rhonchi or rales.  Abdominal:     General: There is distension.     Palpations: Abdomen is soft.     Tenderness: There is no abdominal tenderness.     Comments: Patient is abdomen is distended, normoactive bowel sounds, dull to percussion, nontender to palpation, no peritoneal sign, negative Murphy sign, negative McBurney point.  Patient does have a positive fluid wave.  Musculoskeletal:     Right lower leg: Edema present.     Left lower leg: Edema present.     Comments: Patient has noted bilateral 2+ pitting edema up to his hip flexor, neurovascular fully intact.  Skin:    General: Skin is warm and dry.  Neurological:     Mental Status: He is alert.   Psychiatric:  Mood and Affect: Mood normal.     ED Results / Procedures / Treatments   Labs (all labs ordered are listed, but only abnormal results are displayed) Labs Reviewed  COMPREHENSIVE METABOLIC PANEL - Abnormal; Notable for the following components:      Result Value   Sodium 133 (*)    Chloride 95 (*)    Glucose, Bld 183 (*)    BUN 48 (*)    Creatinine, Ser 1.73 (*)    Calcium 8.6 (*)    Total Protein 6.4 (*)    Albumin 2.8 (*)    Alkaline Phosphatase 213 (*)    GFR, Estimated 41 (*)    All other components within normal limits  BRAIN NATRIURETIC PEPTIDE - Abnormal; Notable for the following components:   B Natriuretic Peptide 1,772.0 (*)    All other components within normal limits  CBC WITH DIFFERENTIAL/PLATELET - Abnormal; Notable for the following components:   RBC 3.61 (*)    Hemoglobin 9.8 (*)    HCT 31.9 (*)    RDW 21.4 (*)    Lymphs Abs 0.3 (*)    All other components within normal limits  URINALYSIS, ROUTINE W REFLEX MICROSCOPIC - Abnormal; Notable for the following components:   Hgb urine dipstick MODERATE (*)    Protein, ur 100 (*)    All other components within normal limits  TROPONIN I (HIGH SENSITIVITY) - Abnormal; Notable for the following components:   Troponin I (High Sensitivity) 20 (*)    All other components within normal limits  TROPONIN I (HIGH SENSITIVITY) - Abnormal; Notable for the following components:   Troponin I (High Sensitivity) 21 (*)    All other components within normal limits  RESP PANEL BY RT-PCR (FLU A&B, COVID) [id number]    EKG None  Radiology US Venous Img Lower Bilateral  Result Date: 04/14/2020 CLINICAL DATA:  Lower extremity edema and pain EXAM: BILATERAL LOWER EXTREMITY VENOUS DUPLEX ULTRASOUND TECHNIQUE: Gray-scale sonography with graded compression, as well as color Doppler and duplex ultrasound were performed to evaluate the lower extremity deep venous systems from the level of the common femoral vein and  including the common femoral, femoral, profunda femoral, popliteal and calf veins including the posterior tibial, peroneal and gastrocnemius veins when visible. The superficial great saphenous vein was also interrogated. Spectral Doppler was utilized to evaluate flow at rest and with distal augmentation maneuvers in the common femoral, femoral and popliteal veins. COMPARISON:  None. FINDINGS: RIGHT LOWER EXTREMITY Common Femoral Vein: No evidence of thrombus. Normal compressibility, respiratory phasicity and response to augmentation. Saphenofemoral Junction: No evidence of thrombus. Normal compressibility and flow on color Doppler imaging. Profunda Femoral Vein: No evidence of thrombus. Normal compressibility and flow on color Doppler imaging. Femoral Vein: No evidence of thrombus. Normal compressibility, respiratory phasicity and response to augmentation. Popliteal Vein: No evidence of thrombus. Normal compressibility, respiratory phasicity and response to augmentation. Calf Veins: No evidence of thrombus. Normal compressibility and flow on color Doppler imaging. Superficial Great Saphenous Vein: No evidence of thrombus. Normal compressibility. Venous Reflux:  None. Other Findings:  None. LEFT LOWER EXTREMITY Common Femoral Vein: No evidence of thrombus. Normal compressibility, respiratory phasicity and response to augmentation. Saphenofemoral Junction: No evidence of thrombus. Normal compressibility and flow on color Doppler imaging. Profunda Femoral Vein: No evidence of thrombus. Normal compressibility and flow on color Doppler imaging. Femoral Vein: No evidence of thrombus. Normal compressibility, respiratory phasicity and response to augmentation. Popliteal Vein: No evidence of thrombus. Normal compressibility, respiratory phasicity  and response to augmentation. Calf Veins: No evidence of thrombus. Normal compressibility and flow on color Doppler imaging. Superficial Great Saphenous Vein: No evidence of  thrombus. Normal compressibility. Venous Reflux:  None. Other Findings:  Lower extremity edema noted bilaterally. IMPRESSION: No evidence of deep venous thrombosis in either lower extremity. Lower extremity soft tissue edema noted bilaterally. Electronically Signed   By: Lowella Grip III M.D.   On: 04/14/2020 19:15   DG Chest Port 1 View  Result Date: 04/14/2020 CLINICAL DATA:  Shortness of breath EXAM: PORTABLE CHEST 1 VIEW COMPARISON:  03/21/2020 FINDINGS: Trace pleural effusions. Stable cardiomediastinal silhouette with aortic atherosclerosis. Mild central vascular congestion with overall decreased edema. No consolidation or pneumothorax. IMPRESSION: Mild central vascular congestion with trace pleural effusions. Electronically Signed   By: Donavan Foil M.D.   On: 04/14/2020 18:23    Procedures Procedures   Medications Ordered in ED Medications  furosemide (LASIX) injection 20 mg (20 mg Intravenous Given 04/14/20 2021)    ED Course  I have reviewed the triage vital signs and the nursing notes.  Pertinent labs & imaging results that were available during my care of the patient were reviewed by me and considered in my medical decision making (see chart for details).    MDM Rules/Calculators/A&P                          Initial impression-patient presents with chief complaint of worsening swelling.  He is alert, does not appear in acute distress, vital signs reassuring.  Will obtain chest pain work-up, add on BNP, and DVT study for further evaluation.  Work-up-CBC shows no signs of leukocytosis, shows normocytic anemia appears to be baseline for patient.  CMP shows elevated glucose of 183, BUN and creatinine appear to be at baseline, hypoalbuminemia 2.8, elevated alk phos of 213, no anion gap present.  This troponin is 20, second troponin 21.  BNP 1772.  UA shows negative nitrites or leukocytes, shows 20-50 red blood cells, no bacteria or white blood cells present.  DVT study negative.   Chest x-ray shows pulmonary edema,  Consult due to worsening edema most likely secondary to CHF will consult hospitalist for further recommendations.  Spoke with Dr. Nori Riis of the hospitalist team she has accepted the patient.  She will come down and evaluate him.  Reassessment patient was reassessed and provide an update on his lab work, recommend that he gets admitted for IV Lasix and further evaluation.  Patient was in agreement with this.  Vital signs are remained stable.  Rule out-I have low suspicion for systemic infection as patient is nontoxic-appearing, vital signs reassuring, no obvious infection on exam or within lab work.  Low suspicion for abdominal abnormality requiring immediate intervention as patient's abdomen soft nontender to palpation, no peritoneal sign, no elevation liver enzymes.  Patient does have elevated alk phos but but there is no right upper quadrant pain to indicate gallbladder or liver abnormalities.  Low suspicion for PE as vital signs have remained stable, patient has no history of PEs.  Low suspicion for DVTs as DVT study is negative.  Low suspicion for ACS as patient denies chest pain, patient had a negative delta troponin, EKG sinus bradycardia without signs of ischemia.  He does have elevated troponins but I suspect this is secondary due to chronic kidney disease, and history of CHF.  Plan-suspect patient's edema is secondary due to CHF and bladder cancer.  Anticipate he will need further  IV diuretics and observation.  Patient care to be transferred to admitting team.    Final Clinical Impression(s) / ED Diagnoses Final diagnoses:  Peripheral edema    Rx / DC Orders ED Discharge Orders    None       Aron Baba 04/14/20 2126    Milton Ferguson, MD 04/15/20 1149

## 2020-04-14 NOTE — ED Triage Notes (Signed)
Pt was sent by [name] due to increased fluid/swelling from CHF. Pt reports his abdomen, scrotum and legs are swollen. Pt reports slight SOB, but is able to speak full sentences in triage without difficulty.

## 2020-04-14 NOTE — Telephone Encounter (Signed)
See phone note

## 2020-04-14 NOTE — Telephone Encounter (Signed)
Wife called today to say husband had gained 4 lbs over the weekend and questions if he needs to be hospitalized.    I spoke with Dr.Nishan and he states after his radiation apt today at 2 pm, patient should go to the ED for admission.   See Dr.Nishan office note from 04/11/20

## 2020-04-14 NOTE — Telephone Encounter (Signed)
New message    Pt c/o swelling: STAT is pt has developed SOB within 24 hours  1) How much weight have you gained and in what time span?  Gained another 4lb since Friday   2) If swelling, where is the swelling located? Waste down   3) Are you currently taking a fluid pill?  yes  4) Are you currently SOB?  Yes   5) Do you have a log of your daily weights (if so, list)? yes  6) Have you gained 3 pounds in a day or 5 pounds in a week? Yes   7) Have you traveled recently? no

## 2020-04-14 NOTE — H&P (Signed)
TRH H&P    Patient Demographics:    Richard [name], is a 75 y.o. male  MRN: [id number]  DOB - 1945-10-04  Admit Date - 04/14/2020  Referring MD/NP/PA: Deno Etienne  Outpatient Primary MD for the patient is Wannetta Sender, FNP  Patient coming from: Home  Chief complaint- edema   HPI:    Richard [name]  is a 75 y.o. 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  Patient reports that he started retaining fluid 2 weeks ago.  His dry weight is 180 pounds and currently he is 194 pounds per his report.  Patient reports that its been a slowly progressing retention of fluid until this weekend when he got acutely worse.  He did see the cardiologist on Friday and increased torsemide to 40 mg daily.  Patient was advised to come into the ER that day, but did not because he wanted to finish his last radiation treatment at [facility] today.  Patient reports that the increase in torsemide has had no effect.  He reports exertional dyspnea and orthopnea.  He has been sleeping in the recliner chair at night.  He reports that his legs are heavy, especially on the left side.  He denies any chest pain, palpitations.  Reports he is thirsty all the time and drinking constantly.  Wife at bedside reports that he has 12-14 100 urine output daily and that has been the same over the weekend.  Patient reports scrotal swelling as well that has caused breakdown of the skin in the posterior scrotum.  He reports that that is painful.  Patient has no other complaints at this time.  Patient is a current smoker and reports that he smokes less than a pack per day.  He did request nicotine patch.  He is not vaccinated for Covid.  He is full code.  In the ED Temp 98.2, heart rate 50, respiratory rate 15, blood pressure 164/75, satting at 97% on  room air White blood cell count 4.5, hemoglobin 9.8 Chemistry panel is mostly unremarkable with a creatinine of 1.73 which is at his baseline and hyperglycemia 183 BNP is elevated at 1772 UA is borderline urine culture sent Chest x-ray shows mild central vascular congestion with trace pleural effusion Ultrasound lower extremities bilateral showed no DVT EKG shows a heart rate of 52, junctional rhythm, QTC 474 Lasix 20 mg IV was given in the ED -patient did take his 40 mg of torsemide this a.m. Admission requested for acute CHF exacerbation    Review of systems:    In addition to the HPI above,  No Fever-chills, No Headache, No changes with Vision or hearing, No problems swallowing food or Liquids, No Chest pain, Cough admits to shortness of Breath, No Abdominal pain, No Nausea or Vomiting, bowel movements are regular, No Blood in stool or Urine, No dysuria, Admits to mild skin breakdown on posterior scrotum No new joints pains-aches,  No new  weakness, tingling, numbness in any extremity, No recent weight gain or loss, No polyuria, polydypsia or polyphagia, No significant Mental Stressors.  All other systems reviewed and are negative.    Past History of the following :    Past Medical History:  Diagnosis Date  . Anemia 12/2019  . Bladder cancer ([place])   . CAD (coronary artery disease)    a. 2007 - DES x 2 proximal to mid RCA b. 09/2017: DES to mid LAD and OM2. Patent stents along RCA.   [name] CHF (congestive heart failure) ([place])   . Essential hypertension   . History of atrial flutter    Status post ablation 2008 - Dr. Caryl Comes  . History of transient ischemic attack (TIA)   . Mixed hyperlipidemia    Statin intolerance  . Pneumonia   . Type 2 diabetes mellitus (HCC)    A1C 11.5 06/2017      Past Surgical History:  Procedure Laterality Date  . BALLOON ANGIOPLASTY, ARTERY    . BICEPS TENDON REPAIR Left   . CARDIAC ELECTROPHYSIOLOGY MAPPING AND ABLATION    . CARPAL TUNNEL  RELEASE Bilateral   . cervical neck fusion     x 4  . CORONARY ANGIOPLASTY    . CORONARY STENT INTERVENTION N/A 10/17/2017   Procedure: CORONARY STENT INTERVENTION;  Surgeon: Martinique, Peter M, MD;  Location: [place] [facility];  Service: Cardiovascular;  Laterality: N/A;  . CORONARY STENT PLACEMENT     x 2  . CYSTOSCOPY W/ RETROGRADES Bilateral 10/08/2019   Procedure: CYSTOSCOPY WITH BILATERAL RETROGRADE PYELOGRAM;  Surgeon: Cleon Gustin, MD;  Location: [facility];  Service: Urology;  Laterality: Bilateral;  . CYSTOSCOPY W/ URETERAL STENT PLACEMENT Bilateral 01/26/2020   Procedure: CYSTOSCOPY WITH RETROGRADE PYELOGRAM bilateral right ureter stent removal;  Surgeon: Irine Seal, MD;  Location: [facility];  Service: Urology;  Laterality: Bilateral;  . CYSTOSCOPY WITH FULGERATION N/A 01/26/2020   Procedure: CYSTOSCOPY WITH FULGERATION, CLOT EVACUATION transurethral resection bladder tumor;  Surgeon: Irine Seal, MD;  Location: [facility];  Service: Urology;  Laterality: N/A;  . CYSTOSCOPY WITH URETHRAL DILATATION  10/08/2019   Procedure: CYSTOSCOPY WITH URETHRAL DILATATION;  Surgeon: Cleon Gustin, MD;  Location: [facility];  Service: Urology;;  . LEFT HEART CATH AND CORONARY ANGIOGRAPHY N/A 10/17/2017   Procedure: LEFT HEART CATH AND CORONARY ANGIOGRAPHY;  Surgeon: Martinique, Peter M, MD;  Location: [place] [facility];  Service: Cardiovascular;  Laterality: N/A;  . ROTATOR CUFF REPAIR Right    x 4  . TIBIA FRACTURE SURGERY Left   . TRANSURETHRAL RESECTION OF BLADDER TUMOR N/A 10/08/2019   Procedure: TRANSURETHRAL RESECTION OF BLADDER TUMOR (TURBT);  Surgeon: Cleon Gustin, MD;  Location: [facility];  Service: Urology;  Laterality: N/A;      Social History:      Social History   Tobacco Use  . Smoking status: Current Every Day Smoker    Packs/day: 0.50    Years: 54.00    Pack years: 27.00    Types: Cigarettes  . Smokeless tobacco: Never Used  Substance Use Topics  . Alcohol use: No     Alcohol/week: 0.0 standard drinks    Comment: rare       Family History :     Family History  Problem Relation Age of Onset  . Diabetes Mother   . Heart disease Mother   . Heart disease Brother   . Lung cancer Brother   . Emphysema Father   . Lung  cancer Brother        x 2  . Lupus Daughter   . Colon cancer Neg Hx   . Esophageal cancer Neg Hx   . Rectal cancer Neg Hx   . Stomach cancer Neg Hx   . Prostate cancer Neg Hx       Home Medications:   Prior to Admission medications   Medication Sig Start Date End Date Taking? Authorizing Provider  acetaminophen (TYLENOL) 325 MG tablet Take 2 tablets (650 mg total) by mouth every 6 (six) hours as needed for mild pain (or Fever >/= 101). 03/23/20   Roxan Hockey, MD  ALPRAZolam Duanne Moron) 0.5 MG tablet Take 0.5 mg by mouth 2 (two) times daily as needed. 01/30/20   [provider]  amiodarone (PACERONE) 200 MG tablet Take 1 tablet (200 mg total) by mouth daily. 04/11/20   Josue Hector, MD  aspirin EC 81 MG tablet Take 1 tablet (81 mg total) by mouth daily. Swallow whole. 04/11/20   Josue Hector, MD  atorvastatin (LIPITOR) 10 MG tablet Take 10 mg by mouth daily. 06/08/19   [provider]  B-D ULTRAFINE III SHORT PEN 31G X 8 MM MISC Inject into the skin. 09/18/19   [provider]  glipiZIDE (GLUCOTROL XL) 10 MG 24 hr tablet Take 10 mg by mouth 2 (two) times daily. 07/16/19   [provider]  hydrALAZINE (APRESOLINE) 25 MG tablet Take 1 tablet (25 mg total) by mouth 2 (two) times daily. 03/23/20 03/23/21  Roxan Hockey, MD  isosorbide mononitrate (IMDUR) 30 MG 24 hr tablet Take 1 tablet (30 mg total) by mouth at bedtime. 05/04/18   Satira Sark, MD  metoprolol succinate (TOPROL-XL) 100 MG 24 hr tablet Take 1 tablet (100 mg total) by mouth daily. Take with or immediately following a meal. 01/30/20   Almyra Deforest, PA  nitroGLYCERIN (NITROSTAT) 0.4 MG SL tablet Place 1 tablet (0.4 mg total) under the  tongue every 5 (five) minutes as needed for chest pain. 07/09/19   Satira Sark, MD  Oxycodone HCl 10 MG TABS Take 1 tablet (10 mg total) by mouth 4 (four) times daily as needed. 04/11/20   Derek Jack, MD  torsemide (DEMADEX) 20 MG tablet Take 40 mg by mouth daily.    [provider]     Allergies:     Allergies  Allergen Reactions  . Statins Other (See Comments)    Leg pain, tolerates lipitor      Physical Exam:   Vitals  Blood pressure (!) 164/75, pulse (!) 50, temperature 98.2 F (36.8 C), temperature source Oral, resp. rate 18, height 6' (1.829 m), weight 81.6 kg, SpO2 97 %.  1.  General: Patient lying supine in bed with head of bed elevated  2. Psychiatric: Flat affect, behavior normal for situation, cooperative with exam  3. Neurologic: The face is symmetric, speech and language is normal, moves all 4 extremities voluntarily, alert and oriented x3, no focal deficit on limited exam  4. HEENMT:  Head is atraumatic, normocephalic, pupils reactive, neck is supple, trachea is midline, mucous membranes are moist  5. Respiratory : Diminished in the lower lung fields, no wheezes, no crackles, rhonchi  6. Cardiovascular : Heart rate is bradycardia, rhythm is regular, no murmurs rubs or gallops  7. Gastrointestinal:  Lower abdomen is 2+ pitting edema, soft, nontender to palpation, no masses palpated  8. Skin:  Skin is warm dry and intact, with irritation on posterior scrotum  9.Musculoskeletal:  2+ pitting edema in the entire lower extremity, venous stasis changes of the lower extremities bilaterally, no acute deformity, no calf tenderness    Data Review:    CBC Recent Labs  Lab 04/14/20 1845  WBC 4.5  HGB 9.8*  HCT 31.9*  PLT 183  MCV 88.4  MCH 27.1  MCHC 30.7  RDW 21.4*  LYMPHSABS 0.3*  MONOABS 0.5  EOSABS 0.0  BASOSABS 0.0    ------------------------------------------------------------------------------------------------------------------  Results for orders placed or performed during the hospital encounter of 04/14/20 (from the past 48 hour(s))  Urinalysis, Routine w reflex microscopic Urine, Clean Catch     Status: Abnormal   Collection Time: 04/14/20  5:31 PM  Result Value Ref Range   Color, Urine YELLOW YELLOW   APPearance CLEAR CLEAR   Specific Gravity, Urine 1.008 1.005 - 1.030   pH 5.0 5.0 - 8.0   Glucose, UA NEGATIVE NEGATIVE mg/dL   Hgb urine dipstick MODERATE (A) NEGATIVE   Bilirubin Urine NEGATIVE NEGATIVE   Ketones, ur NEGATIVE NEGATIVE mg/dL   Protein, ur 100 (A) NEGATIVE mg/dL   Nitrite NEGATIVE NEGATIVE   Leukocytes,Ua NEGATIVE NEGATIVE   RBC / HPF 21-50 0 - 5 RBC/hpf   WBC, UA 0-5 0 - 5 WBC/hpf   Bacteria, UA NONE SEEN NONE SEEN   Mucus PRESENT    Hyaline Casts, UA PRESENT     Comment: Performed at [facility], [address] [place], [place] [address]  Comprehensive metabolic panel     Status: Abnormal   Collection Time: 04/14/20  6:45 PM  Result Value Ref Range   Sodium 133 (L) 135 - 145 mmol/L   Potassium 3.6 3.5 - 5.1 mmol/L   Chloride 95 (L) 98 - 111 mmol/L   CO2 29 22 - 32 mmol/L   Glucose, Bld 183 (H) 70 - 99 mg/dL    Comment: Glucose reference range applies only to samples taken after fasting for at least 8 hours.   BUN 48 (H) 8 - 23 mg/dL   Creatinine, Ser 1.73 (H) 0.61 - 1.24 mg/dL   Calcium 8.6 (L) 8.9 - 10.3 mg/dL   Total Protein 6.4 (L) 6.5 - 8.1 g/dL   Albumin 2.8 (L) 3.5 - 5.0 g/dL   AST 27 15 - 41 U/L   ALT 41 0 - 44 U/L   Alkaline Phosphatase 213 (H) 38 - 126 U/L   Total Bilirubin 0.5 0.3 - 1.2 mg/dL   GFR, Estimated 41 (L) >60 mL/min    Comment: (NOTE) Calculated using the CKD-EPI Creatinine Equation (2021)    Anion gap 9 5 - 15    Comment: Performed at [facility], [address], [place], [place] [address]  Brain natriuretic peptide     Status:  Abnormal   Collection Time: 04/14/20  6:45 PM  Result Value Ref Range   B Natriuretic Peptide 1,772.0 (H) 0.0 - 100.0 pg/mL    Comment: Performed at [facility], [address] [place], [place] [address]  Troponin I (High Sensitivity)     Status: Abnormal   Collection Time: 04/14/20  6:45 PM  Result Value Ref Range   Troponin I (High Sensitivity) 20 (H) <18 ng/L    Comment: (NOTE) Elevated high sensitivity troponin I (hsTnI) values and significant  changes across serial measurements may suggest ACS but many other  chronic and acute conditions are known to elevate hsTnI results.  Refer to the "Links" section for chest pain algorithms and additional  guidance. Performed at  [facility], [address] [place], [place] [address]   CBC with Differential     Status: Abnormal   Collection Time: 04/14/20  6:45 PM  Result Value Ref Range   WBC 4.5 4.0 - 10.5 K/uL   RBC 3.61 (L) 4.22 - 5.81 MIL/uL   Hemoglobin 9.8 (L) 13.0 - 17.0 g/dL   HCT 31.9 (L) 39.0 - 52.0 %   MCV 88.4 80.0 - 100.0 fL   MCH 27.1 26.0 - 34.0 pg   MCHC 30.7 30.0 - 36.0 g/dL   RDW 21.4 (H) 11.5 - 15.5 %   Platelets 183 150 - 400 K/uL   nRBC 0.0 0.0 - 0.2 %   Neutrophils Relative % 81 %   Neutro Abs 3.6 1.7 - 7.7 K/uL   Lymphocytes Relative 6 %   Lymphs Abs 0.3 (L) 0.7 - 4.0 K/uL   Monocytes Relative 12 %   Monocytes Absolute 0.5 0.1 - 1.0 K/uL   Eosinophils Relative 1 %   Eosinophils Absolute 0.0 0.0 - 0.5 K/uL   Basophils Relative 0 %   Basophils Absolute 0.0 0.0 - 0.1 K/uL   Immature Granulocytes 0 %   Abs Immature Granulocytes 0.02 0.00 - 0.07 K/uL    Comment: Performed at [facility], [address] [place], [place] [address]  Troponin I (High Sensitivity)     Status: Abnormal   Collection Time: 04/14/20  8:24 PM  Result Value Ref Range   Troponin I (High Sensitivity) 21 (H) <18 ng/L    Comment: (NOTE) Elevated high sensitivity troponin I (hsTnI) values and significant  changes across serial  measurements may suggest ACS but many other  chronic and acute conditions are known to elevate hsTnI results.  Refer to the "Links" section for chest pain algorithms and additional  guidance. Performed at [facility], [address] [place], [place] [address]     Chemistries  Recent Labs  Lab 04/14/20 1845  NA 133*  K 3.6  CL 95*  CO2 29  GLUCOSE 183*  BUN 48*  CREATININE 1.73*  CALCIUM 8.6*  AST 27  ALT 41  ALKPHOS 213*  BILITOT 0.5   ------------------------------------------------------------------------------------------------------------------  ------------------------------------------------------------------------------------------------------------------ GFR: Estimated Creatinine Clearance: 41.1 mL/min (A) (by C-G formula based on SCr of 1.73 mg/dL (H)). Liver Function Tests: Recent Labs  Lab 04/14/20 1845  AST 27  ALT 41  ALKPHOS 213*  BILITOT 0.5  PROT 6.4*  ALBUMIN 2.8*   No results for input(s): LIPASE, AMYLASE in the last 168 hours. No results for input(s): AMMONIA in the last 168 hours. Coagulation Profile: No results for input(s): INR, PROTIME in the last 168 hours. Cardiac Enzymes: No results for input(s): CKTOTAL, CKMB, CKMBINDEX, TROPONINI in the last 168 hours. BNP (last 3 results) No results for input(s): PROBNP in the last 8760 hours. HbA1C: No results for input(s): HGBA1C in the last 72 hours. CBG: No results for input(s): GLUCAP in the last 168 hours. Lipid Profile: No results for input(s): CHOL, HDL, LDLCALC, TRIG, CHOLHDL, LDLDIRECT in the last 72 hours. Thyroid Function Tests: No results for input(s): TSH, T4TOTAL, FREET4, T3FREE, THYROIDAB in the last 72 hours. Anemia Panel: No results for input(s): VITAMINB12, FOLATE, FERRITIN, TIBC, IRON, RETICCTPCT in the last 72 hours.  --------------------------------------------------------------------------------------------------------------- Urine analysis:    Component Value  Date/Time   COLORURINE YELLOW 04/14/2020 [place] 04/14/2020 1731   APPEARANCEUR Clear 02/01/2020 1447   LABSPEC 1.008 04/14/2020 1731   PHURINE 5.0 04/14/2020 1731   GLUCOSEU  NEGATIVE 04/14/2020 1731   HGBUR MODERATE (A) 04/14/2020 1731   BILIRUBINUR NEGATIVE 04/14/2020 1731   BILIRUBINUR Negative 02/01/2020 1447   [place] 04/14/2020 1731   PROTEINUR 100 (A) 04/14/2020 1731   UROBILINOGEN 0.2 01/04/2007 1000   NITRITE NEGATIVE 04/14/2020 1731   LEUKOCYTESUR NEGATIVE 04/14/2020 1731      Imaging Results:    US Venous Img Lower Bilateral  Result Date: 04/14/2020 CLINICAL DATA:  Lower extremity edema and pain EXAM: BILATERAL LOWER EXTREMITY VENOUS DUPLEX ULTRASOUND TECHNIQUE: Gray-scale sonography with graded compression, as well as color Doppler and duplex ultrasound were performed to evaluate the lower extremity deep venous systems from the level of the common femoral vein and including the common femoral, femoral, profunda femoral, popliteal and calf veins including the posterior tibial, peroneal and gastrocnemius veins when visible. The superficial great saphenous vein was also interrogated. Spectral Doppler was utilized to evaluate flow at rest and with distal augmentation maneuvers in the common femoral, femoral and popliteal veins. COMPARISON:  None. FINDINGS: RIGHT LOWER EXTREMITY Common Femoral Vein: No evidence of thrombus. Normal compressibility, respiratory phasicity and response to augmentation. Saphenofemoral Junction: No evidence of thrombus. Normal compressibility and flow on color Doppler imaging. Profunda Femoral Vein: No evidence of thrombus. Normal compressibility and flow on color Doppler imaging. Femoral Vein: No evidence of thrombus. Normal compressibility, respiratory phasicity and response to augmentation. Popliteal Vein: No evidence of thrombus. Normal compressibility, respiratory phasicity and response to augmentation. Calf Veins: No evidence  of thrombus. Normal compressibility and flow on color Doppler imaging. Superficial Great Saphenous Vein: No evidence of thrombus. Normal compressibility. Venous Reflux:  None. Other Findings:  None. LEFT LOWER EXTREMITY Common Femoral Vein: No evidence of thrombus. Normal compressibility, respiratory phasicity and response to augmentation. Saphenofemoral Junction: No evidence of thrombus. Normal compressibility and flow on color Doppler imaging. Profunda Femoral Vein: No evidence of thrombus. Normal compressibility and flow on color Doppler imaging. Femoral Vein: No evidence of thrombus. Normal compressibility, respiratory phasicity and response to augmentation. Popliteal Vein: No evidence of thrombus. Normal compressibility, respiratory phasicity and response to augmentation. Calf Veins: No evidence of thrombus. Normal compressibility and flow on color Doppler imaging. Superficial Great Saphenous Vein: No evidence of thrombus. Normal compressibility. Venous Reflux:  None. Other Findings:  Lower extremity edema noted bilaterally. IMPRESSION: No evidence of deep venous thrombosis in either lower extremity. Lower extremity soft tissue edema noted bilaterally. Electronically Signed   By: Lowella Grip III M.D.   On: 04/14/2020 19:15   DG Chest Port 1 View  Result Date: 04/14/2020 CLINICAL DATA:  Shortness of breath EXAM: PORTABLE CHEST 1 VIEW COMPARISON:  03/21/2020 FINDINGS: Trace pleural effusions. Stable cardiomediastinal silhouette with aortic atherosclerosis. Mild central vascular congestion with overall decreased edema. No consolidation or pneumothorax. IMPRESSION: Mild central vascular congestion with trace pleural effusions. Electronically Signed   By: Donavan Foil M.D.   On: 04/14/2020 18:23    My personal review of EKG: Rhythm Junctional rhythm, 52 /min, QTc 474 ,no Acute ST changes   Assessment & Plan:    Principal Problem:   CHF exacerbation (HCC) Active Problems:   Essential  hypertension   DM (diabetes mellitus) ([place])   Atrial flutter ([place]) - s/p ablation   1. Acute combined CHF exacerbation 1. CXR shows vasc congestion 2. BNP = 1772 3. Anasarca on exam 4. Trop flat at 21 and 20 5. EKG shows junctional rhythm without ischemic changes 6. Strict I and O, daily weights, fluid restriction 7. Dec 2021 Echo =  EF 30-35% and grade 1 diastolic dysfunction 8. Continue lasix 40mg  IV 9. Cr at baseline 10. Monitor on tele 2. A fib/ Aflutter 1. Continue amiodarone 3. HTN 1. Continue hydralazine 4. CAD 1. Continue imdur, statin, metoprolol, and asa 5. DMII 1. Hold glipizide 2. Carb modified/heart healthy diet 3. Sliding scale coverage 6.    DVT Prophylaxis-   heparin - SCDs  AM Labs Ordered, also please review Full Orders  Family Communication: Admission, patients condition and plan of care including tests being ordered have been discussed with the patient and wife who indicate understanding and agree with the plan and Code Status.  Code Status: Full  Admission status: Inpatient :The appropriate admission status for this patient is INPATIENT. Inpatient status is judged to be reasonable and necessary in order to provide the required intensity of service to ensure the patient's safety. The patient's presenting symptoms, physical exam findings, and initial radiographic and laboratory data in the context of their chronic comorbidities is felt to place them at high risk for further clinical deterioration. Furthermore, it is not anticipated that the patient will be medically stable for discharge from the hospital within 2 midnights of admission. The following factors support the admission status of inpatient.     The patient's presenting symptoms include edema The worrisome physical exam findings include anasarca The initial radiographic and laboratory data are worrisome because of vasc congestion on CXR, elevated BNP The chronic co-morbidities include T2DM,, CAD,  HLD       * I certify that at the point of admission it is my clinical judgment that the patient will require inpatient hospital care spanning beyond 2 midnights from the point of admission due to high intensity of service, high risk for further deterioration and high frequency of surveillance required.*  Time spent in minutes : [place]

## 2020-04-14 NOTE — TOC Initial Note (Signed)
Transition of Care [facility]) - Initial/Assessment Note    Patient Details  Name: Richard [name] MRN: [id number] Date of Birth: January 23, 1946  [facility] CM/SW Contact:    Iona Beard, [place] Phone Number: 04/14/2020, 9:47 PM  Clinical Narrative:                 Pt admitted due to CHF exacerbation. TOC consulted for CHF screen. CSW spoke with pt to complete assessment. Pt lives with his wife and completes ADLs independently. Pt drives "very little" and his wife will take him to appointments when needed. Pt has had [place] services several years ago. Pt states he uses a cane when needed.   CSW completed CHF screen with pt. Pt states he weighs daily. Pt states he follows a heart healthy diet best that he can. Pt takes all medications as prescribed. Pt states that he is followed by [name]. TOC to follow.   Expected Discharge Plan: Home/Self Care Barriers to Discharge: Continued Medical Work up   Patient Goals and CMS Choice Patient states their goals for this hospitalization and ongoing recovery are:: Return home CMS Medicare.gov Compare Post Acute Care list provided to:: Patient Choice offered to / list presented to : Patient  Expected Discharge Plan and Services Expected Discharge Plan: Home/Self Care In-house Referral: Clinical Social Work Discharge Planning Services: CM Consult Post Acute Care Choice: NA Living arrangements for the past 2 months: Single Family Home                 DME Arranged: N/A DME Agency: NA       HH Arranged: NA Learned Agency: NA        Prior Living Arrangements/Services Living arrangements for the past 2 months: Single Family Home Lives with:: Spouse Patient language and need for interpreter reviewed:: Yes Do you feel safe going back to the place where you live?: Yes      Need for Family Participation in Patient Care: Yes (Comment) Care giver support system in place?: Yes (comment) Current home services: DME Criminal Activity/Legal  Involvement Pertinent to Current Situation/Hospitalization: No - Comment as needed  Activities of Daily Living      Permission Sought/Granted                  Emotional Assessment Appearance:: Appears stated age Attitude/Demeanor/Rapport: Engaged Affect (typically observed): Accepting Orientation: : Oriented to Self,Oriented to Place,Oriented to  Time,Oriented to Situation Alcohol / Substance Use: Not Applicable Psych Involvement: No (comment)  Admission diagnosis:  CHF exacerbation ([place]) [I50.9] Patient Active Problem List   Diagnosis Date Noted  . CHF exacerbation ([employer]) 04/14/2020  . Normocytic anemia 03/19/2020  . CKD (chronic kidney disease) stage 3, GFR 30-59 ml/min (HCC) 03/19/2020  . Atrial fibrillation with RVR ([place]) 03/19/2020  . Gross hematuria 01/23/2020  . Acute blood loss anemia 01/23/2020  . 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 Bladder mass 09/20/2019  . Hematuria 09/16/2019  . AKI (acute kidney injury) ([place]) 09/16/2019  . Pulmonary nodules 09/16/2019  . Cigarette nicotine dependence without complication 44/04/4740  . Hyperlipidemia associated with type 2 diabetes mellitus (HCC) - Statin intolerant 10/18/2017  . Unstable angina ([place])   . Chest pain 10/16/2017  . Statin intolerance 07/22/2017  . Stenosis of left carotid artery 07/22/2017  . Atrial flutter ([place]) - s/p ablation   . TIA (transient ischemic attack) 06/27/2017  . Medicare annual wellness visit, subsequent 01/04/2017  . Need for hepatitis C  screening test 01/04/2017  . Chronic non-seasonal allergic rhinitis 04/08/2016  . Need for vaccination 04/08/2016  . DDD (degenerative disc disease), cervical 06/17/2015  . Essential hypertension 11/14/2012  . RLL pneumonia 11/14/2012  . DM (diabetes mellitus) ([place]) 11/14/2012  . Coronary artery disease involving native coronary artery of native heart with unstable angina pectoris ([place]) 11/14/2012  . HIATAL HERNIA 11/24/1983    PCP:  Wannetta Sender, FNP Pharmacy:   [id number] [place], [place] [address] Phone: [phone] Fax: [place], [place] [address] [place] [address] Phone: [phone] Fax: [phone] - [place], [place] [address] Phone: [phone] Fax: [phone]     Social Determinants of Health (SDOH) Interventions    Readmission Risk Interventions Readmission Risk Prevention Plan 03/20/2020  Transportation Screening Complete  HRI or Home Care Consult Complete  Social Work Consult for [place] Planning/Counseling Complete  Palliative Care Screening Not Applicable  Medication Review Press photographer) Complete  Some recent data might be hidden

## 2020-04-14 NOTE — ED Notes (Signed)
Assisted pt with the urinal.

## 2020-04-15 DIAGNOSIS — M898X9 Other specified disorders of bone, unspecified site: Secondary | ICD-10-CM

## 2020-04-15 DIAGNOSIS — I5023 Acute on chronic systolic (congestive) heart failure: Secondary | ICD-10-CM

## 2020-04-15 DIAGNOSIS — N189 Chronic kidney disease, unspecified: Secondary | ICD-10-CM | POA: Diagnosis not present

## 2020-04-15 DIAGNOSIS — I10 Essential (primary) hypertension: Secondary | ICD-10-CM

## 2020-04-15 DIAGNOSIS — C679 Malignant neoplasm of bladder, unspecified: Secondary | ICD-10-CM | POA: Diagnosis not present

## 2020-04-15 DIAGNOSIS — Z7189 Other specified counseling: Secondary | ICD-10-CM | POA: Diagnosis not present

## 2020-04-15 DIAGNOSIS — I5043 Acute on chronic combined systolic (congestive) and diastolic (congestive) heart failure: Secondary | ICD-10-CM | POA: Diagnosis not present

## 2020-04-15 DIAGNOSIS — N179 Acute kidney failure, unspecified: Secondary | ICD-10-CM

## 2020-04-15 LAB — COMPREHENSIVE METABOLIC PANEL
ALT: 35 U/L (ref 0–44)
AST: 20 U/L (ref 15–41)
Albumin: 2.6 g/dL — ABNORMAL LOW (ref 3.5–5.0)
Alkaline Phosphatase: 194 U/L — ABNORMAL HIGH (ref 38–126)
Anion gap: 10 (ref 5–15)
BUN: 42 mg/dL — ABNORMAL HIGH (ref 8–23)
CO2: 31 mmol/L (ref 22–32)
Calcium: 8.8 mg/dL — ABNORMAL LOW (ref 8.9–10.3)
Chloride: 95 mmol/L — ABNORMAL LOW (ref 98–111)
Creatinine, Ser: 1.47 mg/dL — ABNORMAL HIGH (ref 0.61–1.24)
GFR, Estimated: 50 mL/min — ABNORMAL LOW (ref >60)
Glucose, Bld: 129 mg/dL — ABNORMAL HIGH (ref 70–99)
Potassium: 3 mmol/L — ABNORMAL LOW (ref 3.5–5.1)
Sodium: 136 mmol/L (ref 135–145)
Total Bilirubin: 0.6 mg/dL (ref 0.3–1.2)
Total Protein: 5.9 g/dL — ABNORMAL LOW (ref 6.5–8.1)

## 2020-04-15 LAB — CBC WITH DIFFERENTIAL/PLATELET
Abs Immature Granulocytes: 0.02 10*3/uL (ref 0.00–0.07)
Basophils Absolute: 0 10*3/uL (ref 0.0–0.1)
Basophils Relative: 1 %
Eosinophils Absolute: 0.1 10*3/uL (ref 0.0–0.5)
Eosinophils Relative: 2 %
HCT: 30.9 % — ABNORMAL LOW (ref 39.0–52.0)
Hemoglobin: 9.8 g/dL — ABNORMAL LOW (ref 13.0–17.0)
Immature Granulocytes: 0 %
Lymphocytes Relative: 6 %
Lymphs Abs: 0.3 10*3/uL — ABNORMAL LOW (ref 0.7–4.0)
MCH: 27.5 pg (ref 26.0–34.0)
MCHC: 31.7 g/dL (ref 30.0–36.0)
MCV: 86.8 fL (ref 80.0–100.0)
Monocytes Absolute: 0.6 10*3/uL (ref 0.1–1.0)
Monocytes Relative: 13 %
Neutro Abs: 3.4 10*3/uL (ref 1.7–7.7)
Neutrophils Relative %: 78 %
Platelets: 186 10*3/uL (ref 150–400)
RBC: 3.56 MIL/uL — ABNORMAL LOW (ref 4.22–5.81)
RDW: 21.2 % — ABNORMAL HIGH (ref 11.5–15.5)
WBC: 4.5 10*3/uL (ref 4.0–10.5)
nRBC: 0 % (ref 0.0–0.2)

## 2020-04-15 LAB — GLUCOSE, CAPILLARY
Glucose-Capillary: 114 mg/dL — ABNORMAL HIGH (ref 70–99)
Glucose-Capillary: 232 mg/dL — ABNORMAL HIGH (ref 70–99)
Glucose-Capillary: 154 mg/dL — ABNORMAL HIGH (ref 70–99)
Glucose-Capillary: 92 mg/dL (ref 70–99)

## 2020-04-15 LAB — MAGNESIUM: Magnesium: 1.7 mg/dL (ref 1.7–2.4)

## 2020-04-15 MED ORDER — POTASSIUM CHLORIDE CRYS ER 20 MEQ PO TBCR
40.0000 meq | EXTENDED_RELEASE_TABLET | Freq: Two times a day (BID) | ORAL | Status: DC
  Administered 2020-04-15 – 2020-04-20 (×11): 40 meq via ORAL
  Filled 2020-04-15 (×13): qty 2

## 2020-04-15 MED ORDER — HYDRALAZINE HCL 25 MG PO TABS
25.0000 mg | ORAL_TABLET | Freq: Three times a day (TID) | ORAL | Status: DC
  Administered 2020-04-15 – 2020-04-18 (×9): 25 mg via ORAL
  Filled 2020-04-15 (×10): qty 1

## 2020-04-15 MED ORDER — INSULIN ASPART 100 UNIT/ML ~~LOC~~ SOLN
5.0000 [IU] | Freq: Three times a day (TID) | SUBCUTANEOUS | Status: DC
  Administered 2020-04-15 – 2020-04-19 (×9): 5 [IU] via SUBCUTANEOUS

## 2020-04-15 NOTE — Progress Notes (Addendum)
Additional encounter- Advanced care planning-  Met with patient's daughter who is a respiratory therapist in the system, and spouse who is a retired Therapist, sports.  I discussed with them my previous conversation re: his goals of care care and advanced care planning.  Patient shared his wishes for DNR code status.  Patient's spouse and daughter were clear in the desire to maintain patient as full code status. They are realistic in understanding that if patient experienced cardiac and respiratory arrest that he would either not survive CPR or would be on a ventilator with significant debility- and that is not the outcome they would hope for him.  However, both family members expressed feelings that staff are inadequately educated regarding the meaning of DNR vs comfort measures only and fear that Richard [name] would receive less aggressive care if he had DNR order in place.  If patient was transitioned to comfort measures only- then they would agree to DNR status.  Richard [name] agrees with his family members and does not wish to change code status at this time.  Outpatient Palliative followup was discussed and declined.  Mariana Kaufman, AGNP-C Palliative Medicine  Please call Palliative Medicine team phone with any questions [phone]. For individual providers please see AMION.  Total time: 48 minutes

## 2020-04-15 NOTE — Consult Note (Signed)
Consultation Note Date: 04/15/2020   Patient Name: Richard [name]  DOB: Nov 09, 1945  MRN: [id number]  Age / Sex: 75 y.o., male  PCP: Wannetta Sender, FNP Referring Physician: Murlean Iba, MD  Reason for Consultation: Establishing goals of care  HPI/Patient Profile: 75 y.o. 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 Delton Coombes scheduled for March 8) admitted on 04/14/2020 with CHF exacerbation. Palliative medicine consulted for goals of care.  Clinical Assessment and Goals of Care: Met with patient in his room. He was awake and alert and oriented to his situation. He lives at home with his wife where he is able to complete his own ADLs. However when he has swelling in his legs with these heart failure exacerbations it becomes hard for him to maneuver. He is unable to sleep lying down. We discussed the trajectory of his two chronic illnesses his bladder cancer and his heart failure. He shares with me that when he was diagnosed with his cancer he understood then that this would eventually be a life limiting illness for him. We discussed his goals for ongoing medical care. He has hopes are to continue to be as independent as possible for as long as possible. If he became debilitated to the point where he is not able to get out of bed or function on his own, then he knows that this is a point where he would not wish for rehospitalizations or continued medical care and would desire hospice care. He does not currently have advanced directives, however he states his wife is working on this. We discussed CODE STATUS he shares that if his heart stopped and he stopped breathing and he died he would not want  resuscitation, however he wishes to discuss this with his spouse first before making it official. We discussed outpatient palliative follow-up to which she is agreeable but again he also wishes to discuss this with his spouse.  Primary Decision Maker PATIENT    SUMMARY OF RECOMMENDATIONS -Continue current scope of care -Full code- but plan to discuss with spouse and possibly transition to DNR -TOC referral for outpatient Palliative after discussion with spouse -If metastatic disease is confirmed on PET scan and patient meets with outpatient Oncology and is not candidate for systemic therapy- then recommend Hospice    Code Status/Advance Care Planning:  Full code  Prognosis:    Unable to determine  Discharge Planning: To Be Determined  Primary Diagnoses: Present on Admission: . Essential hypertension . Atrial flutter ([place]) - s/p ablation   I have reviewed the medical record, interviewed the patient and family, and examined the patient. The following aspects are pertinent.  Past Medical History:  Diagnosis Date  . Anemia 12/2019  . Bladder cancer ([place])   . CAD (coronary artery disease)    a. 2007 - DES x 2  proximal to mid RCA b. 09/2017: DES to mid LAD and OM2. Patent stents along RCA.   [name] CHF (congestive heart failure) ([place])   . Essential hypertension   . History of atrial flutter    Status post ablation 2008 - Dr. Caryl Comes  . History of transient ischemic attack (TIA)   . Mixed hyperlipidemia    Statin intolerance  . Pneumonia   . Type 2 diabetes mellitus (HCC)    A1C 11.5 06/2017   Social History   Socioeconomic History  . Marital status: Married    Spouse name: Not on file  . Number of children: 4  . Years of education: Not on file  . Highest education level: Not on file  Occupational History  . Occupation: truck Geophysicist/field seismologist    Comment: retired  Tobacco Use  . Smoking status: Current Every Day Smoker    Packs/day: 0.50    Years: 54.00    Pack years: 27.00     Types: Cigarettes  . Smokeless tobacco: Never Used  Vaping Use  . Vaping Use: Never used  Substance and Sexual Activity  . Alcohol use: No    Alcohol/week: 0.0 standard drinks    Comment: rare  . Drug use: No  . Sexual activity: Not Currently  Other Topics Concern  . Not on file  Social History Narrative  . Not on file   Social Determinants of Health   Financial Resource Strain: Low Risk   . Difficulty of Paying Living Expenses: Not hard at all  Food Insecurity: No Food Insecurity  . Worried About Charity fundraiser in the Last Year: Never true  . Ran Out of Food in the Last Year: Never true  Transportation Needs: No Transportation Needs  . Lack of Transportation (Medical): No  . Lack of Transportation (Non-Medical): No  Physical Activity: Inactive  . Days of Exercise per Week: 0 days  . Minutes of Exercise per Session: 0 min  Stress: No Stress Concern Present  . Feeling of Stress : Not at all  Social Connections: Moderately Isolated  . Frequency of Communication with Friends and Family: Three times a week  . Frequency of Social Gatherings with Friends and Family: Three times a week  . Attends Religious Services: Never  . Active Member of Clubs or Organizations: No  . Attends Archivist Meetings: Never  . Marital Status: Married   Scheduled Meds: . amiodarone  200 mg Oral Daily  . aspirin EC  81 mg Oral Daily  . atorvastatin  10 mg Oral Daily  . furosemide  40 mg Intravenous BID  . heparin  5,000 Units Subcutaneous Q8H  . hydrALAZINE  25 mg Oral Q8H  . insulin aspart  0-15 Units Subcutaneous TID WC  . insulin aspart  0-5 Units Subcutaneous QHS  . insulin aspart  5 Units Subcutaneous TID WC  . isosorbide mononitrate  30 mg Oral QHS  . metoprolol succinate  100 mg Oral Daily  . nicotine  14 mg Transdermal Daily  . potassium chloride  40 mEq Oral BID  . silver sulfADIAZINE   Topical BID   Continuous Infusions: PRN Meds:.acetaminophen **OR**  acetaminophen, ALPRAZolam, morphine injection, ondansetron **OR** ondansetron (ZOFRAN) IV, oxyCODONE, polyethylene glycol Medications Prior to Admission:  Prior to Admission medications   Medication Sig Start Date End Date Taking? Authorizing Provider  acetaminophen (TYLENOL) 325 MG tablet Take 2 tablets (650 mg total) by mouth every 6 (six) hours as needed for mild pain (or Fever >/=  101). 03/23/20  Yes Emokpae, Courage, MD  ALPRAZolam (XANAX) 0.5 MG tablet Take 0.5 mg by mouth 2 (two) times daily as needed for sleep. 01/30/20  Yes [provider]  amiodarone (PACERONE) 200 MG tablet Take 1 tablet (200 mg total) by mouth daily. 04/11/20  Yes Josue Hector, MD  aspirin EC 81 MG tablet Take 1 tablet (81 mg total) by mouth daily. Swallow whole. 04/11/20  Yes Josue Hector, MD  atorvastatin (LIPITOR) 10 MG tablet Take 10 mg by mouth daily. 06/08/19  Yes [provider]  glipiZIDE (GLUCOTROL XL) 10 MG 24 hr tablet Take 10 mg by mouth 2 (two) times daily. 07/16/19  Yes [provider]  hydrALAZINE (APRESOLINE) 25 MG tablet Take 1 tablet (25 mg total) by mouth 2 (two) times daily. 03/23/20 03/23/21 Yes Emokpae, Courage, MD  isosorbide mononitrate (IMDUR) 30 MG 24 hr tablet Take 1 tablet (30 mg total) by mouth at bedtime. 05/04/18  Yes Satira Sark, MD  metoprolol succinate (TOPROL-XL) 100 MG 24 hr tablet Take 1 tablet (100 mg total) by mouth daily. Take with or immediately following a meal. 01/30/20  Yes Meng, [place], PA  nitroGLYCERIN (NITROSTAT) 0.4 MG SL tablet Place 1 tablet (0.4 mg total) under the tongue every 5 (five) minutes as needed for chest pain. 07/09/19  Yes Satira Sark, MD  Oxycodone HCl 10 MG TABS Take 1 tablet (10 mg total) by mouth 4 (four) times daily as needed. Patient taking differently: Take 10 mg by mouth 4 (four) times daily as needed (For pain). 04/11/20  Yes Derek Jack, MD  torsemide (DEMADEX) 20 MG tablet Take 40 mg by mouth daily.   Yes  [provider]  B-D ULTRAFINE III SHORT PEN 31G X 8 MM MISC Inject into the skin. 09/18/19   [provider]   Allergies  Allergen Reactions  . Statins Other (See Comments)    Leg pain, tolerates lipitor    Review of Systems  Constitutional: Negative for activity change.  Musculoskeletal: Positive for arthralgias.       Pain in L lower hip  Psychiatric/Behavioral: Positive for sleep disturbance.    Physical Exam  Vital Signs: BP (!) 138/51 (BP Location: Left Arm)   Pulse (!) 57   Temp (!) 97.3 F (36.3 C) (Oral)   Resp 20   Ht 6' (1.829 m)   Wt 84.6 kg   SpO2 100%   BMI 25.30 kg/m  Pain Scale: 0-10   Pain Score: 0-No pain   SpO2: SpO2: 100 % O2 Device:SpO2: 100 % O2 Flow Rate: .   IO: Intake/output summary:   Intake/Output Summary (Last 24 hours) at 04/15/2020 1439 Last data filed at 04/15/2020 1029 Gross per 24 hour  Intake 960 ml  Output 3100 ml  Net -2140 ml    LBM:   Baseline Weight: Weight: 81.6 kg Most recent weight: Weight: 84.6 kg     Palliative Assessment/Data: PPS: 50%     Thank you for this consult. Palliative medicine will continue to follow and assist as needed.   Time In: 1405 Time Out: 1517 Time Total: 72 mins Greater than 50%  of this time was spent counseling and coordinating care related to the above assessment and plan.  Signed by: Mariana Kaufman, AGNP-C Palliative Medicine    Please contact Palliative Medicine Team phone at [phone] for questions and concerns.  For individual provider: See Shea Evans

## 2020-04-15 NOTE — Progress Notes (Signed)
PROGRESS NOTE   Richard [name]  OXB:[id number] DOB: May 17, 1945 DOA: 04/14/2020 PCP: Wannetta Sender, FNP   Chief Complaint  Patient presents with  . Leg Swelling   Level of care: Telemetry  Brief Admission History:  75 y.o. 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  Patient reports that he started retaining fluid 2 weeks ago.  His dry weight is 180 pounds and currently he is 194 pounds per his report.  Patient reports that its been a slowly progressing retention of fluid until this weekend when he got acutely worse.  He did see the cardiologist on Friday and increased torsemide to 40 mg daily.  Patient was advised to come into the ER that day, but did not because he wanted to finish his last radiation treatment at [facility] today.  Patient reports that the increase in torsemide has had no effect.  He reports exertional dyspnea and orthopnea.  He has been sleeping in the recliner chair at night.  He reports that his legs are heavy, especially on the left side.  He denies any chest pain, palpitations.  Reports he is thirsty all the time and drinking constantly.  Wife at bedside reports that he has 12-14 100 urine output daily and that has been the same over the weekend.  Patient reports scrotal swelling as well that has caused breakdown of the skin in the posterior scrotum.  He reports that that is painful.  Patient has no other complaints at this time.  Patient is a current smoker and reports that he smokes less than a pack per day.  He did request nicotine patch.  He is not vaccinated for Covid.  He is full code.  Pt was admitted with acute systolic heart failure that has failed outpatient therapy and has been difficult to manage generally.    Assessment & Plan:   Principal Problem:   CHF exacerbation ([place]) Active Problems:   Essential hypertension   DM (diabetes mellitus) ([place])    Atrial flutter ([place]) - s/p ablation  1. Acute HFrEF - Pt failed outpatient therapy and has been difficult to manage in general.  He is responding favorably to IV lasix diuresis.  I have asked for an inpatient HeartCare consultation for assistance with coming up with an appropriate discharge regimen for him.  HE NEEDS TO STOP SMOKING. We have counseled with him extensively and provided a nicotine patch. He remains very volume overloaded.  Continue daily weights and monitor I/Os closely.   2. Essential Hypertension - resumed home metoprolol, hydralazine and imdur.   3. Paroxysmal Atrial fibrillation/flutter - he remains on amiodarone and toprol XL daily. He is not anticoagulated due to history of bladder cancer with hematuria and anemia.  4. Stage 3a CKD - monitoring BMP daily while on IV lasix diuresis.   5. Bladder cancer - outpatient follow up after discharge for ongoing therapies.  I have asked for a  Palliative consult to help [place] clarify goals of care in the setting of probable metastatic disease.  6. Type 2 diabetes mellitus - holding home glipizide, continue SSI coverage and CBG testing.  7. Tobacco abuser - He was counseled on cessation and nicotine patch offered to use while inpatient.  He verbalized understanding.   DVT prophylaxis: SQ heparin/SCDs Code Status: Full  Family Communication:  Disposition: Home  Status is: Inpatient  Remains inpatient appropriate because:IV  treatments appropriate due to intensity of illness or inability to take PO and Inpatient level of care appropriate due to severity of illness  Dispo: The patient is from: Home              Anticipated d/c is to: Home              Anticipated d/c date is: 3 days              Patient currently is not medically stable to d/c.   Difficult to place patient No   Consultants:   Heartcare  Procedures:   n/a  Antimicrobials:  n/a  Subjective: Pt reports that he has been urinating a lot on IV lasix regimen.  He does  not feel back to his baseline at this time.    Objective: Vitals:   04/14/20 2230 04/15/20 0147 04/15/20 0544 04/15/20 0938  BP: (!) 177/68 (!) 174/67 (!) 158/85 (!) 138/51  Pulse: (!) 52 (!) 54 (!) 57 (!) 57  Resp: 16 16 17 20   Temp:    (!) 97.3 F (36.3 C)  TempSrc:    Oral  SpO2: 99% 99% 94% 100%  Weight:  84.6 kg    Height:  6' (1.829 m)      Intake/Output Summary (Last 24 hours) at 04/15/2020 1312 Last data filed at 04/15/2020 1029 Gross per 24 hour  Intake 960 ml  Output 3100 ml  Net -2140 ml   Filed Weights   04/14/20 1612 04/15/20 0147  Weight: 81.6 kg 84.6 kg    Examination:  General exam: frail, elderly male, Appears calm and comfortable, NAD.  Respiratory system: bibasilar crackles heard. Respiratory effort normal. Cardiovascular system: normal S1 & S2 heard. No JVD, murmurs, rubs, gallops or clicks. No pedal edema. Gastrointestinal system: Abdomen is nondistended, soft and nontender. No organomegaly or masses felt. Normal bowel sounds heard. GU: mild scrotal edema.  Central nervous system: Alert and oriented. No focal neurological deficits. Extremities: 1+ edema BLEs.   Skin: No rashes, lesions or ulcers Psychiatry: Judgement and insight appear normal. Mood & affect appropriate.   Data Reviewed: I have personally reviewed following labs and imaging studies  CBC: Recent Labs  Lab 04/14/20 1845 04/15/20 0530  WBC 4.5 4.5  NEUTROABS 3.6 3.4  HGB 9.8* 9.8*  HCT 31.9* 30.9*  MCV 88.4 86.8  PLT 183 267    Basic Metabolic Panel: Recent Labs  Lab 04/14/20 1845 04/15/20 0530  NA 133* 136  K 3.6 3.0*  CL 95* 95*  CO2 29 31  GLUCOSE 183* 129*  BUN 48* 42*  CREATININE 1.73* 1.47*  CALCIUM 8.6* 8.8*  MG  --  1.7    GFR: Estimated Creatinine Clearance: 48.4 mL/min (A) (by C-G formula based on SCr of 1.47 mg/dL (H)).  Liver Function Tests: Recent Labs  Lab 04/14/20 1845 04/15/20 0530  AST 27 20  ALT 41 35  ALKPHOS 213* 194*  BILITOT 0.5 0.6   PROT 6.4* 5.9*  ALBUMIN 2.8* 2.6*    CBG: Recent Labs  Lab 04/14/20 2302 04/15/20 0753 04/15/20 1124  GLUCAP 167* 114* 232*    Recent Results (from the past 240 hour(s))  Resp Panel by RT-PCR (Flu A&B, Covid) Nasopharyngeal Swab     Status: None   Collection Time: 04/14/20  6:51 PM   Specimen: Nasopharyngeal Swab; Nasopharyngeal(NP) swabs in vial transport medium  Result Value Ref Range Status   SARS Coronavirus 2 by RT PCR NEGATIVE NEGATIVE Final  Comment: (NOTE) SARS-CoV-2 target nucleic acids are NOT DETECTED.  The SARS-CoV-2 RNA is generally detectable in upper respiratory specimens during the acute phase of infection. The lowest concentration of SARS-CoV-2 viral copies this assay can detect is 138 copies/mL. A negative result does not preclude SARS-Cov-2 infection and should not be used as the sole basis for treatment or other patient management decisions. A negative result may occur with  improper specimen collection/handling, submission of specimen other than nasopharyngeal swab, presence of viral mutation(s) within the areas targeted by this assay, and inadequate number of viral copies(<138 copies/mL). A negative result must be combined with clinical observations, patient history, and epidemiological information. The expected result is Negative.  Fact Sheet for Patients:  EntrepreneurPulse.com.au  Fact Sheet for Healthcare Providers:  IncredibleEmployment.be  This test is no t yet approved or cleared by the [place] FDA and  has been authorized for detection and/or diagnosis of SARS-CoV-2 by FDA under an Emergency Use Authorization (EUA). This EUA will remain  in effect (meaning this test can be used) for the duration of the COVID-19 declaration under Section 564(b)(1) of the Act, 21 U.S.C.section 360bbb-3(b)(1), unless the authorization is terminated  or revoked sooner.       Influenza A by PCR NEGATIVE NEGATIVE  Final   Influenza B by PCR NEGATIVE NEGATIVE Final    Comment: (NOTE) The Xpert Xpress SARS-CoV-2/FLU/RSV plus assay is intended as an aid in the diagnosis of influenza from Nasopharyngeal swab specimens and should not be used as a sole basis for treatment. Nasal washings and aspirates are unacceptable for Xpert Xpress SARS-CoV-2/FLU/RSV testing.  Fact Sheet for Patients: EntrepreneurPulse.com.au  Fact Sheet for Healthcare Providers: IncredibleEmployment.be  This test is not yet approved or cleared by the [place] FDA and has been authorized for detection and/or diagnosis of SARS-CoV-2 by FDA under an Emergency Use Authorization (EUA). This EUA will remain in effect (meaning this test can be used) for the duration of the COVID-19 declaration under Section 564(b)(1) of the Act, 21 U.S.C. section 360bbb-3(b)(1), unless the authorization is terminated or revoked.  Performed at [facility], [address], [place], [place] [address]      Radiology Studies: US Venous Img Lower Bilateral  Result Date: 04/14/2020 CLINICAL DATA:  Lower extremity edema and pain EXAM: BILATERAL LOWER EXTREMITY VENOUS DUPLEX ULTRASOUND TECHNIQUE: Gray-scale sonography with graded compression, as well as color Doppler and duplex ultrasound were performed to evaluate the lower extremity deep venous systems from the level of the common femoral vein and including the common femoral, femoral, profunda femoral, popliteal and calf veins including the posterior tibial, peroneal and gastrocnemius veins when visible. The superficial great saphenous vein was also interrogated. Spectral Doppler was utilized to evaluate flow at rest and with distal augmentation maneuvers in the common femoral, femoral and popliteal veins. COMPARISON:  None. FINDINGS: RIGHT LOWER EXTREMITY Common Femoral Vein: No evidence of thrombus. Normal compressibility, respiratory phasicity and response to  augmentation. Saphenofemoral Junction: No evidence of thrombus. Normal compressibility and flow on color Doppler imaging. Profunda Femoral Vein: No evidence of thrombus. Normal compressibility and flow on color Doppler imaging. Femoral Vein: No evidence of thrombus. Normal compressibility, respiratory phasicity and response to augmentation. Popliteal Vein: No evidence of thrombus. Normal compressibility, respiratory phasicity and response to augmentation. Calf Veins: No evidence of thrombus. Normal compressibility and flow on color Doppler imaging. Superficial Great Saphenous Vein: No evidence of thrombus. Normal compressibility. Venous Reflux:  None. Other Findings:  None. LEFT LOWER EXTREMITY Common Femoral  Vein: No evidence of thrombus. Normal compressibility, respiratory phasicity and response to augmentation. Saphenofemoral Junction: No evidence of thrombus. Normal compressibility and flow on color Doppler imaging. Profunda Femoral Vein: No evidence of thrombus. Normal compressibility and flow on color Doppler imaging. Femoral Vein: No evidence of thrombus. Normal compressibility, respiratory phasicity and response to augmentation. Popliteal Vein: No evidence of thrombus. Normal compressibility, respiratory phasicity and response to augmentation. Calf Veins: No evidence of thrombus. Normal compressibility and flow on color Doppler imaging. Superficial Great Saphenous Vein: No evidence of thrombus. Normal compressibility. Venous Reflux:  None. Other Findings:  Lower extremity edema noted bilaterally. IMPRESSION: No evidence of deep venous thrombosis in either lower extremity. Lower extremity soft tissue edema noted bilaterally. Electronically Signed   By: Lowella Grip III M.D.   On: 04/14/2020 19:15   DG Chest Port 1 View  Result Date: 04/14/2020 CLINICAL DATA:  Shortness of breath EXAM: PORTABLE CHEST 1 VIEW COMPARISON:  03/21/2020 FINDINGS: Trace pleural effusions. Stable cardiomediastinal silhouette  with aortic atherosclerosis. Mild central vascular congestion with overall decreased edema. No consolidation or pneumothorax. IMPRESSION: Mild central vascular congestion with trace pleural effusions. Electronically Signed   By: Donavan Foil M.D.   On: 04/14/2020 18:23   Scheduled Meds: . amiodarone  200 mg Oral Daily  . aspirin EC  81 mg Oral Daily  . atorvastatin  10 mg Oral Daily  . furosemide  40 mg Intravenous BID  . heparin  5,000 Units Subcutaneous Q8H  . hydrALAZINE  25 mg Oral Q8H  . insulin aspart  0-15 Units Subcutaneous TID WC  . insulin aspart  0-5 Units Subcutaneous QHS  . isosorbide mononitrate  30 mg Oral QHS  . metoprolol succinate  100 mg Oral Daily  . nicotine  14 mg Transdermal Daily  . potassium chloride  40 mEq Oral BID  . silver sulfADIAZINE   Topical BID   Continuous Infusions:   LOS: 1 day   Time spent: 38 mins   Clanford Wynetta Emery, MD How to contact the [facility] Attending or Consulting provider [place] or covering provider during after hours [place], for this patient?  1. Check the care team in [facility] and look for a) attending/consulting [facility] provider listed and b) the [facility] team listed 2. Log into [email or web address] and use [place]'s universal password to access. If you do not have the password, please contact the hospital operator. 3. Locate the [facility] provider you are looking for under [facility] and page to a number that you can be directly reached. 4. If you still have difficulty reaching the provider, please page the [facility] (Director on Call) for the Hospitalists listed on amion for assistance.  04/15/2020, 1:12 PM

## 2020-04-15 NOTE — Consult Note (Addendum)
Cardiology Consultation:   Patient ID: [name] MRN: [id number]; DOB: [birth year]  Admit date: 04/14/2020 Date of Consult: 04/15/2020  PCP:  Wannetta Sender, [facility]  Cardiologist:  Rozann Lesches, MD  Electrophysiologist:  None    :[phone]}   Patient Profile:   Richard [name] is a 75 y.o. male with a hx of chronic systolic CHF who is being seen today for the evaluation of acute on chronic systolic CHF at the request of Dr Wynetta Emery.  History of Present Illness:   Mr. Jury with history CAD status post DES x2 to the proximal mid RCA 2007, DES to the mid LAD and OM 2 09/2017 patent stents in the RCA at that time.  Off all antiplatelet therapy and is a poor candidate for PCI because we cannot safely use DAPT.  Ischemic cardiomyopathy LVEF 30 to 35%, bladder CA, also has hypertension, history of atrial flutter status post ablation in 2008, HLD statin intolerant, TIA, DM type II, history of AKI. Monitor 01/24/20 showed Atrial fib per notes. Echo 01/24/20 EF 30-35% mild MR AV sclerosis started on amiodarone was to taper from loading dose 400 bid to 200 mg daily Not anticoagulated due to hematuria and bladder cancer Also on Toprol 100 gm daily Not on ACE/ARB/ARNI due to renal failure DAPT stopped for CAD due to hematuria and no ischemic evaluation Planned due to active Rx of bladder cancer   Last saw [name] 02/27/2020 and had acute on chronic systolic CHF.  Demadex was increased 40 mg daily and he was hoping to potentially add Entresto back at low-dose. Weight was 180 lbs that day.    Discharged 03/23/20 Afib with RVR and acute on chronic CHF. Rate controlled with Amio and Toprol. CHADSVASC=6 but not a candidate of CV or anticoag due to hematuria from bladder CA.  Patient saw [name] 03/11/20 with 8 lb weight gain and fluid overload. Refused admission because he had his last radiation treatment yesterday. was demadex 40 bid. Called in yest with 4  more lbs weight gain and advised to go to ED.  Patient currently has periods of shortness of breath and says he would feel better if he could get rid of all his swelling. No longer eating out or getting extra salt. Daughter at bedside.  Past Medical History:  Diagnosis Date  . Anemia 12/2019  . Bladder cancer ([place])   . CAD (coronary artery disease)    a. 2007 - DES x 2 proximal to mid RCA b. 09/2017: DES to mid LAD and OM2. Patent stents along RCA.   [name] CHF (congestive heart failure) ([place])   . Essential hypertension   . History of atrial flutter    Status post ablation 2008 - Dr. Caryl Comes  . History of transient ischemic attack (TIA)   . Mixed hyperlipidemia    Statin intolerance  . Pneumonia   . Type 2 diabetes mellitus (H[facility])    A1C 11.5 06/2017    Past Surgical History:  Procedure Laterality Date  . BALLOON ANGIOPLASTY, ARTERY    . BICEPS TENDON REPAIR Left   . CARDIAC ELECTROPHYSIOLOGY MAPPING AND ABLATION    . CARPAL TUNNEL RELEASE Bilateral   . cervical neck fusion     x 4  . CORONARY ANGIOPLASTY    . CORONARY STENT INTERVENTION N/A 10/17/2017   Procedure: CORONARY STENT INTERVENTION;  Surgeon: Martinique, Peter M, MD;  Location: [facility];  Service: Cardiovascular;  Laterality:  N/A;  . CORONARY STENT PLACEMENT     x 2  . CYSTOSCOPY W/ RETROGRADES Bilateral 10/08/2019   Procedure: CYSTOSCOPY WITH BILATERAL RETROGRADE PYELOGRAM;  Surgeon: Cleon Gustin, MD;  Location: [facility];  Service: Urology;  Laterality: Bilateral;  . CYSTOSCOPY W/ URETERAL STENT PLACEMENT Bilateral 01/26/2020   Procedure: CYSTOSCOPY WITH RETROGRADE PYELOGRAM bilateral right ureter stent removal;  Surgeon: Irine Seal, MD;  Location: [facility];  Service: Urology;  Laterality: Bilateral;  . CYSTOSCOPY WITH FULGERATION N/A 01/26/2020   Procedure: CYSTOSCOPY WITH FULGERATION, CLOT EVACUATION transurethral resection bladder tumor;  Surgeon: Irine Seal, MD;  Location: [facility];  Service: Urology;  Laterality:  N/A;  . CYSTOSCOPY WITH URETHRAL DILATATION  10/08/2019   Procedure: CYSTOSCOPY WITH URETHRAL DILATATION;  Surgeon: Cleon Gustin, MD;  Location: [facility];  Service: Urology;;  . LEFT HEART CATH AND CORONARY ANGIOGRAPHY N/A 10/17/2017   Procedure: LEFT HEART CATH AND CORONARY ANGIOGRAPHY;  Surgeon: Martinique, Peter M, MD;  Location: [facility];  Service: Cardiovascular;  Laterality: N/A;  . ROTATOR CUFF REPAIR Right    x 4  . TIBIA FRACTURE SURGERY Left   . TRANSURETHRAL RESECTION OF BLADDER TUMOR N/A 10/08/2019   Procedure: TRANSURETHRAL RESECTION OF BLADDER TUMOR (TURBT);  Surgeon: Cleon Gustin, MD;  Location: [facility];  Service: Urology;  Laterality: N/A;     Home Medications:  Prior to Admission medications   Medication Sig Start Date End Date Taking? Authorizing Provider  acetaminophen (TYLENOL) 325 MG tablet Take 2 tablets (650 mg total) by mouth every 6 (six) hours as needed for mild pain (or Fever >/= 101). 03/23/20  Yes Emokpae, Courage, MD  ALPRAZolam Duanne Moron) 0.5 MG tablet Take 0.5 mg by mouth 2 (two) times daily as needed for sleep. 01/30/20  Yes [provider]  amiodarone (PACERONE) 200 MG tablet Take 1 tablet (200 mg total) by mouth daily. 04/11/20  Yes Josue Hector, MD  aspirin EC 81 MG tablet Take 1 tablet (81 mg total) by mouth daily. Swallow whole. 04/11/20  Yes Josue Hector, MD  atorvastatin (LIPITOR) 10 MG tablet Take 10 mg by mouth daily. 06/08/19  Yes [provider]  glipiZIDE (GLUCOTROL XL) 10 MG 24 hr tablet Take 10 mg by mouth 2 (two) times daily. 07/16/19  Yes [provider]  hydrALAZINE (APRESOLINE) 25 MG tablet Take 1 tablet (25 mg total) by mouth 2 (two) times daily. 03/23/20 03/23/21 Yes Emokpae, Courage, MD  isosorbide mononitrate (IMDUR) 30 MG 24 hr tablet Take 1 tablet (30 mg total) by mouth at bedtime. 05/04/18  Yes Satira Sark, MD  metoprolol succinate (TOPROL-XL) 100 MG 24 hr tablet Take 1 tablet (100 mg total) by  mouth daily. Take with or immediately following a meal. 01/30/20  Yes Meng, Hubbard, PA  nitroGLYCERIN (NITROSTAT) 0.4 MG SL tablet Place 1 tablet (0.4 mg total) under the tongue every 5 (five) minutes as needed for chest pain. 07/09/19  Yes Satira Sark, MD  Oxycodone HCl 10 MG TABS Take 1 tablet (10 mg total) by mouth 4 (four) times daily as needed. Patient taking differently: Take 10 mg by mouth 4 (four) times daily as needed (For pain). 04/11/20  Yes Derek Jack, MD  torsemide (DEMADEX) 20 MG tablet Take 40 mg by mouth daily.   Yes [provider]  B-D ULTRAFINE III SHORT PEN 31G X 8 MM MISC Inject into the skin. 09/18/19   [provider]    Inpatient Medications: Scheduled  Meds: . amiodarone  200 mg Oral Daily  . aspirin EC  81 mg Oral Daily  . atorvastatin  10 mg Oral Daily  . furosemide  40 mg Intravenous BID  . heparin  5,000 Units Subcutaneous Q8H  . hydrALAZINE  25 mg Oral BID  . insulin aspart  0-15 Units Subcutaneous TID WC  . insulin aspart  0-5 Units Subcutaneous QHS  . isosorbide mononitrate  30 mg Oral QHS  . metoprolol succinate  100 mg Oral Daily  . nicotine  14 mg Transdermal Daily  . silver sulfADIAZINE   Topical BID    PRN Meds: acetaminophen **OR** acetaminophen, ALPRAZolam, morphine injection, ondansetron **OR** ondansetron (ZOFRAN) IV, oxyCODONE, polyethylene glycol  Allergies:    Allergies  Allergen Reactions  . Statins Other (See Comments)    Leg pain, tolerates lipitor     Social History:   Social History   Tobacco Use  . Smoking status: Current Every Day Smoker    Packs/day: 0.50    Years: 54.00    Pack years: 27.00    Types: Cigarettes  . Smokeless tobacco: Never Used  Substance Use Topics  . Alcohol use: No    Alcohol/week: 0.0 standard drinks    Comment: rare    Family History:     Family History  Problem Relation Age of Onset  . Diabetes Mother   . Heart disease Mother   . Heart disease Brother   . Lung  cancer Brother   . Emphysema Father   . Lung cancer Brother        x 2  . Lupus Daughter   . Colon cancer Neg Hx   . Esophageal cancer Neg Hx   . Rectal cancer Neg Hx   . Stomach cancer Neg Hx   . Prostate cancer Neg Hx      ROS:  Please see the history of present illness.  Review of Systems  Constitutional: Positive for malaise/fatigue.  HENT: Negative.   Cardiovascular: Positive for leg swelling.  Respiratory: Positive for shortness of breath and sleep disturbances due to breathing.   Endocrine: Negative.   Hematologic/Lymphatic: Negative.   Musculoskeletal: Negative.   Gastrointestinal: Negative.   Genitourinary: Negative.   Neurological: Negative.     All other ROS reviewed and negative.     Physical Exam/Data:   Vitals:   04/14/20 2200 04/14/20 2230 04/15/20 0147 04/15/20 0544  BP: (!) 167/77 (!) 177/68 (!) 174/67 (!) 158/85  Pulse: (!) 52 (!) 52 (!) 54 (!) 57  Resp: 14 16 16 17   Temp:      TempSrc:      SpO2: 97% 99% 99% 94%  Weight:   84.6 kg   Height:   6' (1.829 m)     Intake/Output Summary (Last 24 hours) at 04/15/2020 0844 Last data filed at 04/15/2020 0700 Gross per 24 hour  Intake 480 ml  Output 2700 ml  Net -2220 ml   Last 3 Weights 04/15/2020 04/14/2020 04/11/2020  Weight (lbs) 186 lb 8.2 oz 180 lb 180 lb  Weight (kg) 84.6 kg 81.647 kg 81.647 kg     Body mass index is 25.3 kg/m.  General:  Well nourished, well developed, in no acute distress HEENT: normal Lymph: no adenopathy Neck: increased JVD Endocrine:  No thryomegaly Vascular: No carotid bruits; FA pulses 2+ bilaterally without bruits  Cardiac:  normal S1, S2; RRR; 1/6 systolic murmur LSB Lungs:  basilar rales  Abd: soft, nontender, no hepatomegaly  Ext: edema up to  abdomen Musculoskeletal:  No deformities, BUE and BLE strength normal and equal Skin: warm and dry  Neuro:  CNs 2-12 intact, no focal abnormalities noted Psych:  Normal affect   EKG:  The EKG was personally reviewed and  demonstrates:  Sinus bradycardia 52/m Telemetry:  Telemetry was personally reviewed and demonstrates:  Sinus bradycardia  Relevant CV Studies: Echocardiogram 01/24/2020:  1. Left ventricular ejection fraction, by estimation, is 30 to 35%. The  left ventricle has moderately decreased function. The left ventricle  demonstrates global hypokinesis. The left ventricular internal cavity size  was mildly dilated. Left ventricular  diastolic parameters are consistent with Grade I diastolic dysfunction  (impaired relaxation). Elevated left atrial pressure.   2. Right ventricular systolic function is normal. The right ventricular  size is normal.   3. The mitral valve is normal in structure. Mild mitral valve  regurgitation. No evidence of mitral stenosis.   4. The aortic valve is tricuspid. Aortic valve regurgitation is not  visualized. Mild aortic valve sclerosis is present, with no evidence of  aortic valve stenosis.   5. The inferior vena cava is normal in size with greater than 50%  respiratory variability, suggesting right atrial pressure of 3 mmHg.    Laboratory Data:  High Sensitivity Troponin:   Recent Labs  Lab 04/14/20 1845 04/14/20 2024  TROPONINIHS 20* 21*     Chemistry Recent Labs  Lab 04/14/20 1845 04/15/20 0530  NA 133* 136  K 3.6 3.0*  CL 95* 95*  CO2 29 31  GLUCOSE 183* 129*  BUN 48* 42*  CREATININE 1.73* 1.47*  CALCIUM 8.6* 8.8*  GFRNONAA 41* 50*  ANIONGAP 9 10    Recent Labs  Lab 04/14/20 1845 04/15/20 0530  PROT 6.4* 5.9*  ALBUMIN 2.8* 2.6*  AST 27 20  ALT 41 35  ALKPHOS 213* 194*  BILITOT 0.5 0.6   Hematology Recent Labs  Lab 04/14/20 1845 04/15/20 0530  WBC 4.5 4.5  RBC 3.61* 3.56*  HGB 9.8* 9.8*  HCT 31.9* 30.9*  MCV 88.4 86.8  MCH 27.1 27.5  MCHC 30.7 31.7  RDW 21.4* 21.2*  PLT 183 186   BNP Recent Labs  Lab 04/14/20 1845  BNP 1,772.0*     Radiology/Studies:  US Venous Img Lower Bilateral  Result Date: 04/14/2020 CLINICAL  DATA:  Lower extremity edema and pain EXAM: BILATERAL LOWER EXTREMITY VENOUS DUPLEX ULTRASOUND TECHNIQUE: Gray-scale sonography with graded compression, as well as color Doppler and duplex ultrasound were performed to evaluate the lower extremity deep venous systems from the level of the common femoral vein and including the common femoral, femoral, profunda femoral, popliteal and calf veins including the posterior tibial, peroneal and gastrocnemius veins when visible. The superficial great saphenous vein was also interrogated. Spectral Doppler was utilized to evaluate flow at rest and with distal augmentation maneuvers in the common femoral, femoral and popliteal veins. COMPARISON:  None. FINDINGS: RIGHT LOWER EXTREMITY Common Femoral Vein: No evidence of thrombus. Normal compressibility, respiratory phasicity and response to augmentation. Saphenofemoral Junction: No evidence of thrombus. Normal compressibility and flow on color Doppler imaging. Profunda Femoral Vein: No evidence of thrombus. Normal compressibility and flow on color Doppler imaging. Femoral Vein: No evidence of thrombus. Normal compressibility, respiratory phasicity and response to augmentation. Popliteal Vein: No evidence of thrombus. Normal compressibility, respiratory phasicity and response to augmentation. Calf Veins: No evidence of thrombus. Normal compressibility and flow on color Doppler imaging. Superficial Great Saphenous Vein: No evidence of thrombus. Normal compressibility. Venous Reflux:  None. Other Findings:  None. LEFT LOWER EXTREMITY Common Femoral Vein: No evidence of thrombus. Normal compressibility, respiratory phasicity and response to augmentation. Saphenofemoral Junction: No evidence of thrombus. Normal compressibility and flow on color Doppler imaging. Profunda Femoral Vein: No evidence of thrombus. Normal compressibility and flow on color Doppler imaging. Femoral Vein: No evidence of thrombus. Normal compressibility,  respiratory phasicity and response to augmentation. Popliteal Vein: No evidence of thrombus. Normal compressibility, respiratory phasicity and response to augmentation. Calf Veins: No evidence of thrombus. Normal compressibility and flow on color Doppler imaging. Superficial Great Saphenous Vein: No evidence of thrombus. Normal compressibility. Venous Reflux:  None. Other Findings:  Lower extremity edema noted bilaterally. IMPRESSION: No evidence of deep venous thrombosis in either lower extremity. Lower extremity soft tissue edema noted bilaterally. Electronically Signed   By: Lowella Grip III M.D.   On: 04/14/2020 19:15   DG Chest Port 1 View  Result Date: 04/14/2020 CLINICAL DATA:  Shortness of breath EXAM: PORTABLE CHEST 1 VIEW COMPARISON:  03/21/2020 FINDINGS: Trace pleural effusions. Stable cardiomediastinal silhouette with aortic atherosclerosis. Mild central vascular congestion with overall decreased edema. No consolidation or pneumothorax. IMPRESSION: Mild central vascular congestion with trace pleural effusions. Electronically Signed   By: Donavan Foil M.D.   On: 04/14/2020 18:23    Assessment and Plan:    Acute on chronic combined systolic/diastolic HF diuresed 2.2 L weight unreliable as is up 6 lbs from yest. On Lasix 40 mg IV BID. K 3.0-replace, Crt 1.47 - 01/2020 echo LVE 30-35%, grade I dd  No ACE/ARB/ARNI due to history of renal dysfunction    PAF, MAT at times- currently SB, on amio 200mg  daily and  toprol 100 mg. May want to decrease Toprol 50 mg daily but BP high today, cannot anticoag due to history of bladder CA and hematuria, anemia   CAD s/p DES to RCA in 2007 with DES to mid-LAD and DES to OM2 in 09/2017. He denies any recent chest pain.  - No plans for ischemic testing at this time as he is not a candidate for DAPT. ASA was discontinued during his last admission secondary to hematuria and anemia. Remains on Atorvastatin 10mg  daily, Imdur 30mg  daily and Toprol-XL 100mg   daily.   HTN-BP up on hydralazine 25 mg bid-will increase to tid  CKD Crt 1.47 improved with diuresis-1.73 yest  Risk Assessment/Risk Scores:     New York Heart Association (NYHA) Functional Class NYHA Class IV  CHA2DS2-VASc Score = 5  This indicates a 7.2% annual risk of stroke. The patient's score is based upon: CHF History: Yes HTN History: Yes Diabetes History: Yes Stroke History: No Vascular Disease History: Yes Age Score: 1 Gender Score: 0    For questions or updates, please contact [place] Please consult [email or web address] for contact info under    Signed, Ermalinda Barrios, PA-C  04/15/2020 8:44 AM    Attending note:  Patient seen and examined.  He is well-known to me.  I reviewed the chart including recent office visit with [name], also discussed the case with Ms. Vita Barley, I agree with her above findings.  Mr. Harren presents with fluid overload in association with acute on chronic systolic heart failure, LVEF 30 to 35% by most recent assessment.  Outpatient Demadex was increased this past Friday without response.  On examination this morning he is in no distress.  Systolic blood pressure coming down from the 170s to the 130s, heart rate in the 50s in sinus rhythm by telemetry  which I personally reviewed.  Lungs exhibit decreased breath sounds at the bases.  Cardiac exam with RRR no gallop.  He has 2-3+ leg edema bilaterally.  Pertinent lab work includes potassium 3.0, BUN 42, creatinine 1.47, normal AST and ALT, hemoglobin 9.8, high-sensitivity troponin I 20 and 21, BNP 1772.  Chest x-ray shows mild central vascular congestion with trace pleural effusions.  ECG shows sinus bradycardia.  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  Continue IV diuresis with acute on chronic systolic heart failure and fluid  overload, responding well to Lasix 40 mg IV twice daily with 2200 cc net output last 24 hours.  Creatinine down to 1.47, replete potassium.  Continue amiodarone for rhythm suppression, increase hydralazine for better blood pressure control, stay on Imdur and present dose of Toprol-XL although may down titrate beta-blocker eventually.  Satira Sark, M.D., F.A.C.C.

## 2020-04-16 DIAGNOSIS — I5043 Acute on chronic combined systolic (congestive) and diastolic (congestive) heart failure: Secondary | ICD-10-CM | POA: Diagnosis not present

## 2020-04-16 DIAGNOSIS — I480 Paroxysmal atrial fibrillation: Secondary | ICD-10-CM

## 2020-04-16 DIAGNOSIS — I2510 Atherosclerotic heart disease of native coronary artery without angina pectoris: Secondary | ICD-10-CM

## 2020-04-16 DIAGNOSIS — I483 Typical atrial flutter: Secondary | ICD-10-CM

## 2020-04-16 LAB — BASIC METABOLIC PANEL
Anion gap: 13 (ref 5–15)
BUN: 41 mg/dL — ABNORMAL HIGH (ref 8–23)
CO2: 30 mmol/L (ref 22–32)
Calcium: 9.2 mg/dL (ref 8.9–10.3)
Chloride: 98 mmol/L (ref 98–111)
Creatinine, Ser: 1.62 mg/dL — ABNORMAL HIGH (ref 0.61–1.24)
GFR, Estimated: 44 mL/min — ABNORMAL LOW (ref >60)
Glucose, Bld: 70 mg/dL (ref 70–99)
Potassium: 4.1 mmol/L (ref 3.5–5.1)
Sodium: 141 mmol/L (ref 135–145)

## 2020-04-16 LAB — GLUCOSE, CAPILLARY
Glucose-Capillary: 87 mg/dL (ref 70–99)
Glucose-Capillary: 183 mg/dL — ABNORMAL HIGH (ref 70–99)
Glucose-Capillary: 76 mg/dL (ref 70–99)
Glucose-Capillary: 174 mg/dL — ABNORMAL HIGH (ref 70–99)

## 2020-04-16 LAB — URINE CULTURE: Culture: NO GROWTH

## 2020-04-16 LAB — TSH: TSH: 6.904 u[IU]/mL — ABNORMAL HIGH (ref 0.350–4.500)

## 2020-04-16 LAB — MAGNESIUM: Magnesium: 1.7 mg/dL (ref 1.7–2.4)

## 2020-04-16 LAB — T4, FREE: Free T4: 0.96 ng/dL (ref 0.61–1.12)

## 2020-04-16 MED ORDER — MAGNESIUM SULFATE 2 GM/50ML IV SOLN
2.0000 g | Freq: Once | INTRAVENOUS | Status: AC
  Administered 2020-04-16: 2 g via INTRAVENOUS
  Filled 2020-04-16: qty 50

## 2020-04-16 NOTE — Progress Notes (Signed)
Progress Note  Patient Name: Richard [name] Date of Encounter: 04/16/2020  [facility] Cardiologist: Rozann Lesches, MD   Subjective   Patient reports that he had a much better diuretic response initially, but is also concerned about his ability to get up frequently to use the restroom.   Inpatient Medications    Scheduled Meds: . amiodarone  200 mg Oral Daily  . aspirin EC  81 mg Oral Daily  . atorvastatin  10 mg Oral Daily  . furosemide  40 mg Intravenous BID  . heparin  5,000 Units Subcutaneous Q8H  . hydrALAZINE  25 mg Oral Q8H  . insulin aspart  0-15 Units Subcutaneous TID WC  . insulin aspart  0-5 Units Subcutaneous QHS  . insulin aspart  5 Units Subcutaneous TID WC  . isosorbide mononitrate  30 mg Oral QHS  . metoprolol succinate  100 mg Oral Daily  . nicotine  14 mg Transdermal Daily  . potassium chloride  40 mEq Oral BID  . silver sulfADIAZINE   Topical BID   Continuous Infusions:  PRN Meds: acetaminophen **OR** acetaminophen, ALPRAZolam, morphine injection, ondansetron **OR** ondansetron (ZOFRAN) IV, oxyCODONE, polyethylene glycol   Vital Signs    Vitals:   04/15/20 0938 04/15/20 2053 04/16/20 0547 04/16/20 0808  BP: (!) 138/51 (!) 157/62 (!) 146/69 125/63  Pulse: (!) 57 (!) 58 (!) 52 (!) 54  Resp: 20 20 20    Temp: (!) 97.3 F (36.3 C) 98.2 F (36.8 C) 98.2 F (36.8 C)   TempSrc: Oral Oral Oral   SpO2: 100% 98% 97%   Weight:   89.1 kg   Height:        Intake/Output Summary (Last 24 hours) at 04/16/2020 1116 Last data filed at 04/16/2020 1024 Gross per 24 hour  Intake 820 ml  Output 1951 ml  Net -1131 ml   Last 3 Weights 04/16/2020 04/15/2020 04/14/2020  Weight (lbs) 196 lb 6.9 oz 186 lb 8.2 oz 180 lb  Weight (kg) 89.1 kg 84.6 kg 81.647 kg      Telemetry    Normal sinus rhythm - Personally Reviewed  ECG    Normal sinus rhythm - Personally Reviewed  Physical Exam   GEN: No acute distress.   Neck: JVD visible while sitting upright,  +hepatojugular reflex  Cardiac: RRR, no murmurs, rubs, or gallops. 2+ pitting edema to the thighs Respiratory: Clear to auscultation bilaterally. GI: Soft, nontender, mildly distended  MS: No joint edema; No deformity. Neuro:  Nonfocal  Psych: Normal affect   Labs    High Sensitivity Troponin:   Recent Labs  Lab 04/14/20 1845 04/14/20 2024  TROPONINIHS 20* 21*      Chemistry Recent Labs  Lab 04/14/20 1845 04/15/20 0530 04/16/20 0447  NA 133* 136 141  K 3.6 3.0* 4.1  CL 95* 95* 98  CO2 29 31 30   GLUCOSE 183* 129* 70  BUN 48* 42* 41*  CREATININE 1.73* 1.47* 1.62*  CALCIUM 8.6* 8.8* 9.2  PROT 6.4* 5.9*  --   ALBUMIN 2.8* 2.6*  --   AST 27 20  --   ALT 41 35  --   ALKPHOS 213* 194*  --   BILITOT 0.5 0.6  --   GFRNONAA 41* 50* 44*  ANIONGAP 9 10 13      Hematology Recent Labs  Lab 04/14/20 1845 04/15/20 0530  WBC 4.5 4.5  RBC 3.61* 3.56*  HGB 9.8* 9.8*  HCT 31.9* 30.9*  MCV 88.4 86.8  MCH 27.1 27.5  MCHC 30.7 31.7  RDW 21.4* 21.2*  PLT 183 186    BNP Recent Labs  Lab 04/14/20 1845  BNP 1,772.0*     DDimer No results for input(s): DDIMER in the last 168 hours.   Radiology    US Venous Img Lower Bilateral  Result Date: 04/14/2020 CLINICAL DATA:  Lower extremity edema and pain EXAM: BILATERAL LOWER EXTREMITY VENOUS DUPLEX ULTRASOUND TECHNIQUE: Gray-scale sonography with graded compression, as well as color Doppler and duplex ultrasound were performed to evaluate the lower extremity deep venous systems from the level of the common femoral vein and including the common femoral, femoral, profunda femoral, popliteal and calf veins including the posterior tibial, peroneal and gastrocnemius veins when visible. The superficial great saphenous vein was also interrogated. Spectral Doppler was utilized to evaluate flow at rest and with distal augmentation maneuvers in the common femoral, femoral and popliteal veins. COMPARISON:  None. FINDINGS: RIGHT LOWER  EXTREMITY Common Femoral Vein: No evidence of thrombus. Normal compressibility, respiratory phasicity and response to augmentation. Saphenofemoral Junction: No evidence of thrombus. Normal compressibility and flow on color Doppler imaging. Profunda Femoral Vein: No evidence of thrombus. Normal compressibility and flow on color Doppler imaging. Femoral Vein: No evidence of thrombus. Normal compressibility, respiratory phasicity and response to augmentation. Popliteal Vein: No evidence of thrombus. Normal compressibility, respiratory phasicity and response to augmentation. Calf Veins: No evidence of thrombus. Normal compressibility and flow on color Doppler imaging. Superficial Great Saphenous Vein: No evidence of thrombus. Normal compressibility. Venous Reflux:  None. Other Findings:  None. LEFT LOWER EXTREMITY Common Femoral Vein: No evidence of thrombus. Normal compressibility, respiratory phasicity and response to augmentation. Saphenofemoral Junction: No evidence of thrombus. Normal compressibility and flow on color Doppler imaging. Profunda Femoral Vein: No evidence of thrombus. Normal compressibility and flow on color Doppler imaging. Femoral Vein: No evidence of thrombus. Normal compressibility, respiratory phasicity and response to augmentation. Popliteal Vein: No evidence of thrombus. Normal compressibility, respiratory phasicity and response to augmentation. Calf Veins: No evidence of thrombus. Normal compressibility and flow on color Doppler imaging. Superficial Great Saphenous Vein: No evidence of thrombus. Normal compressibility. Venous Reflux:  None. Other Findings:  Lower extremity edema noted bilaterally. IMPRESSION: No evidence of deep venous thrombosis in either lower extremity. Lower extremity soft tissue edema noted bilaterally. Electronically Signed   By: Lowella Grip III M.D.   On: 04/14/2020 19:15   DG Chest Port 1 View  Result Date: 04/14/2020 CLINICAL DATA:  Shortness of breath EXAM:  PORTABLE CHEST 1 VIEW COMPARISON:  03/21/2020 FINDINGS: Trace pleural effusions. Stable cardiomediastinal silhouette with aortic atherosclerosis. Mild central vascular congestion with overall decreased edema. No consolidation or pneumothorax. IMPRESSION: Mild central vascular congestion with trace pleural effusions. Electronically Signed   By: Donavan Foil M.D.   On: 04/14/2020 18:23    Cardiac Studies   Echocardiogram 01/24/2020: 1. Left ventricular ejection fraction, by estimation, is 30 to 35%. The  left ventricle has moderately decreased function. The left ventricle  demonstrates global hypokinesis. The left ventricular internal cavity size  was mildly dilated. Left ventricular  diastolic parameters are consistent with Grade I diastolic dysfunction  (impaired relaxation). Elevated left atrial pressure.  2. Right ventricular systolic function is normal. The right ventricular  size is normal.  3. The mitral valve is normal in structure. Mild mitral valve  regurgitation. No evidence of mitral stenosis.  4. The aortic valve is tricuspid. Aortic valve regurgitation is not  visualized. Mild aortic valve sclerosis is present, with no  evidence of  aortic valve stenosis.  5. The inferior vena cava is normal in size with greater than 50%  respiratory variability, suggesting right atrial pressure of 3 mmHg.   Coronary angiography 10/17/2017  Prox RCA lesion is 15% stenosed.  Mid RCA lesion is 30% stenosed.  Mid LM lesion is 25% stenosed.  Mid LAD lesion is 85% stenosed.  A drug-eluting stent was successfully placed using a STENT SYNERGY DES 2.75X16.  Post intervention, there is a 0% residual stenosis.  Ost 2nd Mrg lesion is 50% stenosed.  Mid Cx lesion is 45% stenosed.  3rd Mrg lesion is 85% stenosed.  A drug-eluting stent was successfully placed using a STENT SYNERGY DES 3X24.  Post intervention, there is a 0% residual stenosis.  LV end diastolic pressure is normal.    1. Severe 2 vessel obstructive CAD- patient has diffuse CAD    - 85% mid LAD    - 85% large OM2 2. Patent stents in RCA.  3. Normal LVEDP 4. Successful PCI of the mid LAD with DES 5. Successful PCI of the OM2  Plan: aggressive risk factor modification. Need to stress compliance with medical therapy. DAPT for one year. Since he has no documented atrial arrhythmia since his ablation I would not resume DOAC. Anticipate DC in am  Recommend uninterrupted dual antiplatelet therapy with Aspirin 81mg  daily and Ticagrelor 90mg  twice daily for a minimum of 12 months (ACS - Class I recommendation).   Patient Profile     TERESO UNANGST is a 75 y.o. male with a hx of chronic systolic CHF who is being seen today for the evaluation of acute on chronic systolic CHF at the request of Dr Wynetta Emery.   Assessment & Plan    Mr. Potenza is a 75 year old man with h/o ischemic dilated cardiomyopathy, coronary artery disease s/p PCI RCA, LAD, and OM as well as atrial flutter and atrial fibrillation who is admitted for acute decompensated heart failure.   1. Dilated ischemic cardiomyopathy, acutely decompensated  Pt with ischemic cardiomyopathy and baseline NYHA class II symptoms admitted for acute decompensated heart failure and failure to respond to escalation of outpatient diuretics. He is respondign well to IV furosemide and is net negative 3.2L since admission. Will plant to continue IV furosemide with goal net negative 1-2L overnight. In the interim will plan to continue b-blocker. Will consider transition to carvedilol from Metoprolol given bradycardia and HTN. Will also plan to continue isosorbide/hydralazine and consider transition to BiDil as patient is intolerant of RAAS inhibition. Will also discuss use of empagliflozin with the primary cardiologist.   2. Coronary artery disease  Pt with h/o CAD s/p PCI to RCA in 2007, LAD and OM in 2019, currently angina free. Will continue ASA and Lipitor. Will  continue outpatient antianginals.   3. HTN Pt with improved blood pressure control this AM after increase in Hydralazine to TID. Will plan to continue current regimen.   4. Atrial fibrillation, paroxysmal non-valvular  Pt with h/o paroxysmal non-valvular atrial fibrillation, in sinus rhythm today on amiodarone. Will continue Amiodarone for rhythm control at this time. Will check TSH to evaluate for side effects. Although CHA2DS2-Vasc 5 patient is not on systemic anticoagulation due to history of bleeding r/t bladder tumor. Will discuss alternatives to anticoagulation such as watchman device with primary cardiologist.    For questions or updates, please contact [place] Please consult [email or web address] for contact info under        Signed, Memory Dance, MD,  [facility] 04/16/2020, 11:16 AM

## 2020-04-16 NOTE — Progress Notes (Signed)
Pt called nurses station stating he could not breath. O2 sat 99% on room air. Informed pt of result. Pt requested and was administered xanax.

## 2020-04-16 NOTE — Progress Notes (Signed)
PROGRESS NOTE   MAR WALMER  HWE:[id number] DOB: Mar 10, 1945 DOA: 04/14/2020 PCP: Wannetta Sender, FNP   Chief Complaint  Patient presents with  . Leg Swelling   Level of care: Telemetry  Brief Admission History:  75 y.o. 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  Patient reports that he started retaining fluid 2 weeks ago.  His dry weight is 180 pounds and currently he is 194 pounds per his report.  Patient reports that its been a slowly progressing retention of fluid until this weekend when he got acutely worse.  He did see the cardiologist on Friday and increased torsemide to 40 mg daily.  Patient was advised to come into the ER that day, but did not because he wanted to finish his last radiation treatment at [facility] today.  Patient reports that the increase in torsemide has had no effect.  He reports exertional dyspnea and orthopnea.  He has been sleeping in the recliner chair at night.  He reports that his legs are heavy, especially on the left side.  He denies any chest pain, palpitations.  Reports he is thirsty all the time and drinking constantly.  Wife at bedside reports that he has 12-14 100 urine output daily and that has been the same over the weekend.  Patient reports scrotal swelling as well that has caused breakdown of the skin in the posterior scrotum.  He reports that that is painful.  Patient has no other complaints at this time.  Patient is a current smoker and reports that he smokes less than a pack per day.  He did request nicotine patch.  He is not vaccinated for Covid.  He is full code.  Pt was admitted with acute systolic heart failure that has failed outpatient therapy and has been difficult to manage generally.    Assessment & Plan:   Principal Problem:   CHF exacerbation ([place]) Active Problems:   Essential hypertension   DM (diabetes mellitus) ([place])    Atrial flutter ([place]) - s/p ablation   Paroxysmal atrial fibrillation ([facility])   Coronary artery disease involving native coronary artery of native heart without angina pectoris  1)Acute HFrEF -admitted with acute on chronic systolic dysfunction CHF/dilated cardiomyopathy --last known EF 30 to 35% based on echo from December 2021  pt failed outpatient diuretic therapy  --Diuresing well with IV Lasix, voiding well overall, fatigue and dyspnea on exertion persist -Fluid balance remains negative -Isosorbide hydralazine combo per cardiology team -Patient is being switched from metoprolol to Coreg for better BP control and due to propensity towards bradycardia on metoprolol -Patient is intolerant to RAAS inhibition   2)Essential Hypertension -anticipate further BP improvement with adjustments of medication as above #1   3)paroxysmal Atrial fibrillation/flutter - he remains on amiodarone , Toprol to be changed to Coreg as above #1-  he is not anticoagulated due to history of bladder cancer with hematuria and anemia.  ????-- if candidate for watchman device  4)Stage 3a CKD - monitoring BMP daily while on IV lasix diuresis.    5) CAD-- CAD s/p PCI to RCA in 2007, LAD and OM in 2019,  -Continue Lipitor, aspirin and Coreg -Remains chest pain-free  6)Bladder cancer - outpatient follow up after discharge for ongoing therapies.  I have asked for a  Palliative consult appreciated   7)type 2 diabetes mellitus - holding home glipizide, continue  SSI coverage and CBG testing.  -May benefit from empagliflozin  8)Tobacco abuser - -nicotine patch advised smoking cessation advised   9) chronic anemia--- suspect some degree of anemia of CKD  DVT prophylaxis: SQ heparin/SCDs Code Status: Full  Family Communication:  Disposition: Home  Status is: Inpatient  Remains inpatient appropriate because:IV treatments appropriate due to intensity of illness or inability to take PO and Inpatient level of care  appropriate due to severity of illness  Dispo: The patient is from: Home              Anticipated d/c is to: Home              Anticipated d/c date is: 3 days              Patient currently is not medically stable to d/c.   Difficult to place patient No   Consultants:   Heartcare  Procedures:   n/a  Antimicrobials:  n/a  Subjective: ---Diuresing well with IV Lasix, voiding well overall, fatigue and dyspnea on exertion persist --No chest pains  Objective: Vitals:   04/16/20 0547 04/16/20 0808 04/16/20 1335 04/16/20 1447  BP: (!) 146/69 125/63 (!) 144/70 (!) 147/65  Pulse: (!) 52 (!) 54  (!) 51  Resp: 20   17  Temp: 98.2 F (36.8 C)   97.8 F (36.6 C)  TempSrc: Oral   Oral  SpO2: 97%   100%  Weight: 89.1 kg     Height:        Intake/Output Summary (Last 24 hours) at 04/16/2020 1801 Last data filed at 04/16/2020 1100 Gross per 24 hour  Intake 820 ml  Output 2251 ml  Net -1431 ml   Filed Weights   04/14/20 1612 04/15/20 0147 04/16/20 0547  Weight: 81.6 kg 84.6 kg 89.1 kg    Examination:   Physical Exam  Gen:- Awake Alert, dyspnea on exertion HEENT:- [place].AT, No sclera icterus Neck-Supple Neck, +ve JVD,.  Lungs-diminished breath sounds, no wheeze  CV- S1, S2 normal, irregularly irregular Abd-  +ve B.Sounds, Abd Soft, No tenderness,    Extremity/Skin:- +ve  edema,    Psych-affect is appropriate, oriented x3 Neuro-denies weakness, no new focal deficits, no tremors   Data Reviewed: I have personally reviewed following labs and imaging studies  CBC: Recent Labs  Lab 04/14/20 1845 04/15/20 0530  WBC 4.5 4.5  NEUTROABS 3.6 3.4  HGB 9.8* 9.8*  HCT 31.9* 30.9*  MCV 88.4 86.8  PLT 183 622    Basic Metabolic Panel: Recent Labs  Lab 04/14/20 1845 04/15/20 0530 04/16/20 0447  NA 133* 136 141  K 3.6 3.0* 4.1  CL 95* 95* 98  CO2 29 31 30   GLUCOSE 183* 129* 70  BUN 48* 42* 41*  CREATININE 1.73* 1.47* 1.62*  CALCIUM 8.6* 8.8* 9.2  MG  --  1.7 1.7     GFR: Estimated Creatinine Clearance: 43.9 mL/min (A) (by C-G formula based on SCr of 1.62 mg/dL (H)).  Liver Function Tests: Recent Labs  Lab 04/14/20 1845 04/15/20 0530  AST 27 20  ALT 41 35  ALKPHOS 213* 194*  BILITOT 0.5 0.6  PROT 6.4* 5.9*  ALBUMIN 2.8* 2.6*    CBG: Recent Labs  Lab 04/15/20 1630 04/15/20 2126 04/16/20 0801 04/16/20 1117 04/16/20 1658  GLUCAP 154* 92 87 183* 76    Recent Results (from the past 240 hour(s))  Resp Panel by RT-PCR (Flu A&B, Covid) Nasopharyngeal Swab     Status: None  Collection Time: 04/14/20  6:51 PM   Specimen: Nasopharyngeal Swab; Nasopharyngeal(NP) swabs in vial transport medium  Result Value Ref Range Status   SARS Coronavirus 2 by RT PCR NEGATIVE NEGATIVE Final    Comment: (NOTE) SARS-CoV-2 target nucleic acids are NOT DETECTED.  The SARS-CoV-2 RNA is generally detectable in upper respiratory specimens during the acute phase of infection. The lowest concentration of SARS-CoV-2 viral copies this assay can detect is 138 copies/mL. A negative result does not preclude SARS-Cov-2 infection and should not be used as the sole basis for treatment or other patient management decisions. A negative result may occur with  improper specimen collection/handling, submission of specimen other than nasopharyngeal swab, presence of viral mutation(s) within the areas targeted by this assay, and inadequate number of viral copies(<138 copies/mL). A negative result must be combined with clinical observations, patient history, and epidemiological information. The expected result is Negative.  Fact Sheet for Patients:  EntrepreneurPulse.com.au  Fact Sheet for Healthcare Providers:  IncredibleEmployment.be  This test is no t yet approved or cleared by the [place] FDA and  has been authorized for detection and/or diagnosis of SARS-CoV-2 by FDA under an Emergency Use Authorization (EUA). This EUA  will remain  in effect (meaning this test can be used) for the duration of the COVID-19 declaration under Section 564(b)(1) of the Act, 21 U.S.C.section 360bbb-3(b)(1), unless the authorization is terminated  or revoked sooner.       Influenza A by PCR NEGATIVE NEGATIVE Final   Influenza B by PCR NEGATIVE NEGATIVE Final    Comment: (NOTE) The Xpert Xpress SARS-CoV-2/FLU/RSV plus assay is intended as an aid in the diagnosis of influenza from Nasopharyngeal swab specimens and should not be used as a sole basis for treatment. Nasal washings and aspirates are unacceptable for Xpert Xpress SARS-CoV-2/FLU/RSV testing.  Fact Sheet for Patients: EntrepreneurPulse.com.au  Fact Sheet for Healthcare Providers: IncredibleEmployment.be  This test is not yet approved or cleared by the [place] FDA and has been authorized for detection and/or diagnosis of SARS-CoV-2 by FDA under an Emergency Use Authorization (EUA). This EUA will remain in effect (meaning this test can be used) for the duration of the COVID-19 declaration under Section 564(b)(1) of the Act, 21 U.S.C. section 360bbb-3(b)(1), unless the authorization is terminated or revoked.  Performed at [facility], [address] [place], [place] [address]   Culture, Urine     Status: None   Collection Time: 04/14/20 10:47 PM   Specimen: Urine, Clean Catch  Result Value Ref Range Status   Specimen Description   Final    URINE, CLEAN CATCH Performed at [facility], [address] [place], [place] [address]    Special Requests   Final    NONE Performed at [facility], [address] [place], [place] [address]    Culture   Final    NO GROWTH Performed at [facility], [place] [address], [place], [place] [address]    Report Status 04/16/2020 FINAL  Final     Radiology Studies: US Venous Img Lower Bilateral  Result Date: 04/14/2020 CLINICAL DATA:  Lower extremity edema and pain EXAM:  BILATERAL LOWER EXTREMITY VENOUS DUPLEX ULTRASOUND TECHNIQUE: Gray-scale sonography with graded compression, as well as color Doppler and duplex ultrasound were performed to evaluate the lower extremity deep venous systems from the level of the common femoral vein and including the common femoral, femoral, profunda femoral, popliteal and calf veins including the posterior tibial, peroneal and gastrocnemius veins when visible.  The superficial great saphenous vein was also interrogated. Spectral Doppler was utilized to evaluate flow at rest and with distal augmentation maneuvers in the common femoral, femoral and popliteal veins. COMPARISON:  None. FINDINGS: RIGHT LOWER EXTREMITY Common Femoral Vein: No evidence of thrombus. Normal compressibility, respiratory phasicity and response to augmentation. Saphenofemoral Junction: No evidence of thrombus. Normal compressibility and flow on color Doppler imaging. Profunda Femoral Vein: No evidence of thrombus. Normal compressibility and flow on color Doppler imaging. Femoral Vein: No evidence of thrombus. Normal compressibility, respiratory phasicity and response to augmentation. Popliteal Vein: No evidence of thrombus. Normal compressibility, respiratory phasicity and response to augmentation. Calf Veins: No evidence of thrombus. Normal compressibility and flow on color Doppler imaging. Superficial Great Saphenous Vein: No evidence of thrombus. Normal compressibility. Venous Reflux:  None. Other Findings:  None. LEFT LOWER EXTREMITY Common Femoral Vein: No evidence of thrombus. Normal compressibility, respiratory phasicity and response to augmentation. Saphenofemoral Junction: No evidence of thrombus. Normal compressibility and flow on color Doppler imaging. Profunda Femoral Vein: No evidence of thrombus. Normal compressibility and flow on color Doppler imaging. Femoral Vein: No evidence of thrombus. Normal compressibility, respiratory phasicity and response to augmentation.  Popliteal Vein: No evidence of thrombus. Normal compressibility, respiratory phasicity and response to augmentation. Calf Veins: No evidence of thrombus. Normal compressibility and flow on color Doppler imaging. Superficial Great Saphenous Vein: No evidence of thrombus. Normal compressibility. Venous Reflux:  None. Other Findings:  Lower extremity edema noted bilaterally. IMPRESSION: No evidence of deep venous thrombosis in either lower extremity. Lower extremity soft tissue edema noted bilaterally. Electronically Signed   By: Lowella Grip III M.D.   On: 04/14/2020 19:15   Scheduled Meds: . amiodarone  200 mg Oral Daily  . aspirin EC  81 mg Oral Daily  . atorvastatin  10 mg Oral Daily  . furosemide  40 mg Intravenous BID  . heparin  5,000 Units Subcutaneous Q8H  . hydrALAZINE  25 mg Oral Q8H  . insulin aspart  0-15 Units Subcutaneous TID WC  . insulin aspart  0-5 Units Subcutaneous QHS  . insulin aspart  5 Units Subcutaneous TID WC  . isosorbide mononitrate  30 mg Oral QHS  . metoprolol succinate  100 mg Oral Daily  . nicotine  14 mg Transdermal Daily  . potassium chloride  40 mEq Oral BID  . silver sulfADIAZINE   Topical BID   Continuous Infusions:   LOS: 2 days   Roxan Hockey, MD How to contact the [facility] Attending or Consulting provider Reserve or covering provider during after hours [place], for this patient?  1. Check the care team in [facility] and look for a) attending/consulting [facility] provider listed and b) the [facility] team listed 2. Log into [email or web address] and use [place]'s universal password to access. If you do not have the password, please contact the hospital operator. 3. Locate the [facility] provider you are looking for under [facility] and page to a number that you can be directly reached. 4. If you still have difficulty reaching the provider, please page the [facility] (Director on Call) for the Hospitalists listed on amion for assistance.  04/16/2020, 6:01 PM

## 2020-04-16 NOTE — Progress Notes (Signed)
Cardiology Office Note    Date:  04/22/2020   ID:  Richard [name], DOB [birth year], MRN [id number]   PCP:  Wannetta Sender, [place]  Cardiologist:  Rozann Lesches, MD  Advanced Practice Provider:  No care team member to display Electrophysiologist:  None   [id number]   Chief Complaint  Patient presents with  . Follow-up  . Hospitalization Follow-up    History of Present Illness:  Richard [name] is a 75 y.o. male  with history CAD status post DES x2 to the proximal mid RCA 2007, DES to the mid LAD and OM 2 09/2017 patent stents in the RCA at that time.  Off all antiplatelet therapy and is a poor candidate for PCI because we cannot safely use DAPT.  Ischemic cardiomyopathy LVEF 30 to 35%, bladder CA, also has hypertension, history of atrial flutter status post ablation in 2008, HLD statin intolerant, TIA, DM type II, history of AKI. Monitor 01/24/20 showed Atrial fib per notes. Echo 01/24/20 EF 30-35% mild MR AV sclerosis started on amiodarone was to taper from loading dose 400 bid to 200 mg daily Not anticoagulated due to hematuria and bladder cancer Also on Toprol 100 gm daily Not on ACE/ARB/ARNI due to renal failure DAPT stopped for CAD due to hematuria and no ischemic evaluation Planned due to active Rx of bladder cancer    Last saw [name] 02/27/2020 and had acute on chronic systolic CHF.  Demadex was increased 40 mg daily and he was hoping to potentially add Entresto back at low-dose. Weight was 180 lbs that day.     Discharged 03/23/20 Afib with RVR and acute on chronic CHF. Rate controlled with Amio and Toprol. CHADSVASC=6 but not a candidate of CV or anticoag due to hematuria from bladder CA.   Patient saw [name] 03/11/20 with 8 lb weight gain and fluid overload. Refused admission because he had his last radiation treatment that Monday. was on demadex 40 bid. He was admitted 04/15/20 and diuresed. Sent home on jardiance but not entresto  b/c of rising renal function.  Patient comes in accompanied by his wife. Still putting out fluid. Has some in upper thighs but no where else. Not eating out. Weight 174 lb on his scales was 175 at discharge. Having a lot of hip pain and has a PET scan on Thurs.     Past Medical History:  Diagnosis Date  . Anemia 12/2019  . Bladder cancer ([place])   . CAD (coronary artery disease)    a. 2007 - DES x 2 proximal to mid RCA b. 09/2017: DES to mid LAD and OM2. Patent stents along RCA.   [name] CHF (congestive heart failure) ([place])   . Essential hypertension   . History of atrial flutter    Status post ablation 2008 - Dr. Caryl Comes  . History of transient ischemic attack (TIA)   . Mixed hyperlipidemia    Statin intolerance  . Pneumonia   . Type 2 diabetes mellitus (HCC)    A1C 11.5 06/2017    Past Surgical History:  Procedure Laterality Date  . BALLOON ANGIOPLASTY, ARTERY    . BICEPS TENDON REPAIR Left   . CARDIAC ELECTROPHYSIOLOGY MAPPING AND ABLATION    . CARPAL TUNNEL RELEASE Bilateral   . cervical neck fusion     x 4  . CORONARY ANGIOPLASTY    . CORONARY STENT INTERVENTION N/A 10/17/2017   Procedure: CORONARY STENT INTERVENTION;  Surgeon:  Martinique, Peter M, MD;  Location: [facility];  Service: Cardiovascular;  Laterality: N/A;  . CORONARY STENT PLACEMENT     x 2  . CYSTOSCOPY W/ RETROGRADES Bilateral 10/08/2019   Procedure: CYSTOSCOPY WITH BILATERAL RETROGRADE PYELOGRAM;  Surgeon: Cleon Gustin, MD;  Location: [facility];  Service: Urology;  Laterality: Bilateral;  . CYSTOSCOPY W/ URETERAL STENT PLACEMENT Bilateral 01/26/2020   Procedure: CYSTOSCOPY WITH RETROGRADE PYELOGRAM bilateral right ureter stent removal;  Surgeon: Irine Seal, MD;  Location: [facility];  Service: Urology;  Laterality: Bilateral;  . CYSTOSCOPY WITH FULGERATION N/A 01/26/2020   Procedure: CYSTOSCOPY WITH FULGERATION, CLOT EVACUATION transurethral resection bladder tumor;  Surgeon: Irine Seal, MD;  Location: [facility];   Service: Urology;  Laterality: N/A;  . CYSTOSCOPY WITH URETHRAL DILATATION  10/08/2019   Procedure: CYSTOSCOPY WITH URETHRAL DILATATION;  Surgeon: Cleon Gustin, MD;  Location: [facility];  Service: Urology;;  . LEFT HEART CATH AND CORONARY ANGIOGRAPHY N/A 10/17/2017   Procedure: LEFT HEART CATH AND CORONARY ANGIOGRAPHY;  Surgeon: Martinique, Peter M, MD;  Location: [facility];  Service: Cardiovascular;  Laterality: N/A;  . ROTATOR CUFF REPAIR Right    x 4  . TIBIA FRACTURE SURGERY Left   . TRANSURETHRAL RESECTION OF BLADDER TUMOR N/A 10/08/2019   Procedure: TRANSURETHRAL RESECTION OF BLADDER TUMOR (TURBT);  Surgeon: Cleon Gustin, MD;  Location: [facility];  Service: Urology;  Laterality: N/A;    Current Medications: Current Meds  Medication Sig  . acetaminophen (TYLENOL) 325 MG tablet Take 2 tablets (650 mg total) by mouth every 6 (six) hours as needed for mild pain (or Fever >/= 101).  [name] ALPRAZolam (XANAX) 0.5 MG tablet Take 0.5 mg by mouth 2 (two) times daily as needed for sleep.  [name] amiodarone (PACERONE) 200 MG tablet Take 1 tablet (200 mg total) by mouth daily.  [name] aspirin EC 81 MG tablet Take 1 tablet (81 mg total) by mouth daily with breakfast. Swallow whole.  [name] atorvastatin (LIPITOR) 10 MG tablet Take 10 mg by mouth daily.  . B-D ULTRAFINE III SHORT PEN 31G X 8 MM MISC Inject into the skin.  . carvedilol (COREG) 12.5 MG tablet Take 1 tablet (12.5 mg total) by mouth 2 (two) times daily with a meal.  . empagliflozin (JARDIANCE) 10 MG TABS tablet Take 1 tablet (10 mg total) by mouth daily.  . empagliflozin (JARDIANCE) 10 MG TABS tablet Take by mouth daily.  [name] glipiZIDE (GLUCOTROL XL) 10 MG 24 hr tablet Take 10 mg by mouth 2 (two) times daily.  . hydrALAZINE (APRESOLINE) 25 MG tablet Take 1 tablet (25 mg total) by mouth 2 (two) times daily.  . isosorbide-hydrALAZINE (BIDIL) 20-37.5 MG tablet Take 1 tablet by mouth 3 (three) times daily.  . nicotine (NICODERM CQ - DOSED IN MG/24  HOURS) 14 mg/24hr patch Place 1 patch (14 mg total) onto the skin daily.  . nitroGLYCERIN (NITROSTAT) 0.4 MG SL tablet Place 1 tablet (0.4 mg total) under the tongue every 5 (five) minutes as needed for chest pain.  . Oxycodone HCl 10 MG TABS Take 1 tablet (10 mg total) by mouth 4 (four) times daily as needed. (Patient taking differently: Take 10 mg by mouth 4 (four) times daily as needed (For pain).)  . Potassium Chloride ER 20 MEQ TBCR Take 20 mEq by mouth daily. -While taking torsemide  . torsemide 40 MG TABS Take 40 mg by mouth See admin instructions. Take 40 mg every morning and 20 mg  every evening     Allergies:   Statins   Social History   Socioeconomic History  . Marital status: Married    Spouse name: Not on file  . Number of children: 4  . Years of education: Not on file  . Highest education level: Not on file  Occupational History  . Occupation: truck Geophysicist/field seismologist    Comment: retired  Tobacco Use  . Smoking status: Current Every Day Smoker    Packs/day: 0.50    Years: 54.00    Pack years: 27.00    Types: Cigarettes  . Smokeless tobacco: Never Used  Vaping Use  . Vaping Use: Never used  Substance and Sexual Activity  . Alcohol use: No    Alcohol/week: 0.0 standard drinks    Comment: rare  . Drug use: No  . Sexual activity: Not Currently  Other Topics Concern  . Not on file  Social History Narrative  . Not on file   Social Determinants of Health   Financial Resource Strain: Low Risk   . Difficulty of Paying Living Expenses: Not hard at all  Food Insecurity: No Food Insecurity  . Worried About Charity fundraiser in the Last Year: Never true  . Ran Out of Food in the Last Year: Never true  Transportation Needs: No Transportation Needs  . Lack of Transportation (Medical): No  . Lack of Transportation (Non-Medical): No  Physical Activity: Inactive  . Days of Exercise per Week: 0 days  . Minutes of Exercise per Session: 0 min  Stress: No Stress Concern Present  .  Feeling of Stress : Not at all  Social Connections: Moderately Isolated  . Frequency of Communication with Friends and Family: Three times a week  . Frequency of Social Gatherings with Friends and Family: Three times a week  . Attends Religious Services: Never  . Active Member of Clubs or Organizations: No  . Attends Archivist Meetings: Never  . Marital Status: Married     Family History:  The patient's family history includes Diabetes in his mother; Emphysema in his father; Heart disease in his brother and mother; Lung cancer in his brother and brother; Lupus in his daughter.   ROS:   Please see the history of present illness.    ROS All other systems reviewed and are negative.   PHYSICAL EXAM:   VS:  BP (!) 116/50   Pulse 63   Ht 6' (1.829 m)   Wt 184 lb 4.8 oz (83.6 kg)   SpO2 92%   BMI 25.00 kg/m   Physical Exam  GEN: Thin, in no acute distress  Neck: no JVD, carotid bruits, or masses Cardiac:RRR; no murmurs, rubs, or gallops  Respiratory:  clear to auscultation bilaterally, normal work of breathing GI: soft, nontender, nondistended, + BS Ext: upper thigh edema but nowhere else, Good distal pulses bilaterally Neuro:  Alert and Oriented x 3 Psych: euthymic mood, full affect  Wt Readings from Last 3 Encounters:  04/22/20 184 lb 4.8 oz (83.6 kg)  04/20/20 180 lb 14.4 oz (82.1 kg)  04/11/20 180 lb (81.6 kg)      Studies/Labs Reviewed:   EKG:  EKG is not ordered today.    Recent Labs: 04/14/2020: TSH 6.904 04/15/2020: ALT 35 04/17/2020: B Natriuretic Peptide 1,536.0; Hemoglobin 9.4; Platelets 175 04/20/2020: BUN 29; Creatinine, Ser 1.81; Magnesium 1.9; Potassium 5.0; Sodium 132   Lipid Panel    Component Value Date/Time   CHOL 157 09/16/2019 0614   TRIG  121 09/16/2019 0614   HDL 39 (L) 09/16/2019 0614   CHOLHDL 4.0 09/16/2019 0614   VLDL 24 09/16/2019 0614   LDLCALC 94 09/16/2019 0614    Additional studies/ records that were reviewed today include:   Echocardiogram 01/24/2020:  1. Left ventricular ejection fraction, by estimation, is 30 to 35%. The  left ventricle has moderately decreased function. The left ventricle  demonstrates global hypokinesis. The left ventricular internal cavity size  was mildly dilated. Left ventricular  diastolic parameters are consistent with Grade I diastolic dysfunction  (impaired relaxation). Elevated left atrial pressure.   2. Right ventricular systolic function is normal. The right ventricular  size is normal.   3. The mitral valve is normal in structure. Mild mitral valve  regurgitation. No evidence of mitral stenosis.   4. The aortic valve is tricuspid. Aortic valve regurgitation is not  visualized. Mild aortic valve sclerosis is present, with no evidence of  aortic valve stenosis.   5. The inferior vena cava is normal in size with greater than 50%  respiratory variability, suggesting right atrial pressure of 3 mmHg.         Risk Assessment/Calculations:    CHA2DS2-VASc Score = 4  This indicates a 4.8% annual risk of stroke. The patient's score is based upon: CHF History: Yes HTN History: Yes Diabetes History: Yes Stroke History: No Vascular Disease History: No Age Score: 1 Gender Score: 0        ASSESSMENT:    1. Chronic systolic congestive heart failure (HCC)   2. Paroxysmal atrial fibrillation ([place])   3. Coronary artery disease involving native coronary artery of native heart without angina pectoris   4. Bladder mass   5. Stage 3 chronic kidney disease, unspecified whether stage 3a or 3b CKD ([place])      PLAN:  In order of problems listed above:   chronic systolic CHF LVEF 30 to 30% on echo 01/2020 just discharged 2 days ago after IV diuresis. Still putting fluid out. Not sent home on entresto b/c of rising Crt. 1.81. will recheck next week. Compensated today   Afib with RVR-previous ablation 2008, not candidate for anticoagulation and CHADSVASC=5. Aim for rate  control.rate controlled on Amio and coreg   CAD with stents listed above.  Has been off antiplatelet therapy and is a poor candidate for PCI since he cannot take DAPT.  Angina currently being treated with Norvasc Imdur Lipitor and Toprol-no chest pain today.   Bladder CA with hematuria status post resection and clot evacuation currently finished radiation and scheduled for PET scan next week for hip pain.   History of acute renal insufficiency creatinine 1.81 04/20/2020. Recheck next week.     Shared Decision Making/Informed Consent        Medication Adjustments/Labs and Tests Ordered: Current medicines are reviewed at length with the patient today.  Concerns regarding medicines are outlined above.  Medication changes, Labs and Tests ordered today are listed in the Patient Instructions below. Patient Instructions  Medication Instructions:  Your physician recommends that you continue on your current medications as directed. Please refer to the Current Medication list given to you today.  *If you need a refill on your cardiac medications before your next appointment, please call your pharmacy*   Lab Work: BMET next Thursday  05/01/20)   If you have labs (blood work) drawn today and your tests are completely normal, you will receive your results only by: [name] MyChart Message (if you have MyChart) OR . A paper  copy in the mail If you have any lab test that is abnormal or we need to change your treatment, we will call you to review the results.   Testing/Procedures: None today   Follow-Up: At [facility], you and your health needs are our priority.  As part of our continuing mission to provide you with exceptional heart care, we have created designated Provider Care Teams.  These Care Teams include your primary Cardiologist (physician) and Advanced Practice Providers (APPs -  Physician Assistants and Nurse Practitioners) who all work together to provide you with the care you need, when  you need it.  We recommend signing up for the patient portal called "MyChart".  Sign up information is provided on this After Visit Summary.  MyChart is used to connect with patients for Virtual Visits (Telemedicine).  Patients are able to view lab/test results, encounter notes, upcoming appointments, etc.  Non-urgent messages can be sent to your provider as well.   To learn more about what you can do with MyChart, go to [email or web address].    Your next appointment:   2 week(s)  The format for your next appointment:   In Person  Provider:   Rozann Lesches, MD, Bernerd Pho, PA-C or Ermalinda Barrios, PA-C   Other Instructions None    Thank you for choosing [facility] !            Signed, Ermalinda Barrios, PA-C  04/22/2020 1:13 PM    [facility] [address], [place], [place]  [address] Phone: [phone]; Fax: [phone]

## 2020-04-17 ENCOUNTER — Ambulatory Visit: Payer: Medicare Other | Admitting: Cardiology

## 2020-04-17 DIAGNOSIS — I2510 Atherosclerotic heart disease of native coronary artery without angina pectoris: Secondary | ICD-10-CM | POA: Diagnosis not present

## 2020-04-17 DIAGNOSIS — I483 Typical atrial flutter: Secondary | ICD-10-CM | POA: Diagnosis not present

## 2020-04-17 DIAGNOSIS — I10 Essential (primary) hypertension: Secondary | ICD-10-CM | POA: Diagnosis not present

## 2020-04-17 DIAGNOSIS — I5043 Acute on chronic combined systolic (congestive) and diastolic (congestive) heart failure: Secondary | ICD-10-CM | POA: Diagnosis not present

## 2020-04-17 LAB — GLUCOSE, CAPILLARY
Glucose-Capillary: 145 mg/dL — ABNORMAL HIGH (ref 70–99)
Glucose-Capillary: 172 mg/dL — ABNORMAL HIGH (ref 70–99)
Glucose-Capillary: 108 mg/dL — ABNORMAL HIGH (ref 70–99)
Glucose-Capillary: 248 mg/dL — ABNORMAL HIGH (ref 70–99)

## 2020-04-17 LAB — BASIC METABOLIC PANEL
Anion gap: 11 (ref 5–15)
BUN: 36 mg/dL — ABNORMAL HIGH (ref 8–23)
CO2: 29 mmol/L (ref 22–32)
Calcium: 9 mg/dL (ref 8.9–10.3)
Chloride: 98 mmol/L (ref 98–111)
Creatinine, Ser: 1.61 mg/dL — ABNORMAL HIGH (ref 0.61–1.24)
GFR, Estimated: 45 mL/min — ABNORMAL LOW (ref >60)
Glucose, Bld: 161 mg/dL — ABNORMAL HIGH (ref 70–99)
Potassium: 4.2 mmol/L (ref 3.5–5.1)
Sodium: 138 mmol/L (ref 135–145)

## 2020-04-17 LAB — CBC
HCT: 31.5 % — ABNORMAL LOW (ref 39.0–52.0)
Hemoglobin: 9.4 g/dL — ABNORMAL LOW (ref 13.0–17.0)
MCH: 26.6 pg (ref 26.0–34.0)
MCHC: 29.8 g/dL — ABNORMAL LOW (ref 30.0–36.0)
MCV: 89.2 fL (ref 80.0–100.0)
Platelets: 175 10*3/uL (ref 150–400)
RBC: 3.53 MIL/uL — ABNORMAL LOW (ref 4.22–5.81)
RDW: 21.6 % — ABNORMAL HIGH (ref 11.5–15.5)
WBC: 4.8 10*3/uL (ref 4.0–10.5)
nRBC: 0 % (ref 0.0–0.2)

## 2020-04-17 LAB — BRAIN NATRIURETIC PEPTIDE: B Natriuretic Peptide: 1536 pg/mL — ABNORMAL HIGH (ref 0.0–100.0)

## 2020-04-17 LAB — MAGNESIUM: Magnesium: 1.8 mg/dL (ref 1.7–2.4)

## 2020-04-17 MED ORDER — EMPAGLIFLOZIN 10 MG PO TABS
10.0000 mg | ORAL_TABLET | Freq: Every day | ORAL | Status: DC
  Administered 2020-04-17 – 2020-04-20 (×4): 10 mg via ORAL
  Filled 2020-04-17 (×5): qty 1

## 2020-04-17 MED ORDER — CARVEDILOL 12.5 MG PO TABS
12.5000 mg | ORAL_TABLET | Freq: Two times a day (BID) | ORAL | Status: DC
  Administered 2020-04-17 – 2020-04-20 (×6): 12.5 mg via ORAL
  Filled 2020-04-17 (×7): qty 1

## 2020-04-17 NOTE — Progress Notes (Signed)
PROGRESS NOTE   Richard [name]  YBO:[id number] DOB: Oct 06, 1945 DOA: 04/14/2020 PCP: Wannetta Sender, FNP   Chief Complaint  Patient presents with  . Leg Swelling   Level of care: Telemetry  Brief Admission History:  75 y.o. 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  Patient reports that he started retaining fluid 2 weeks ago.  His dry weight is 180 pounds and currently he is 194 pounds per his report.  Patient reports that its been a slowly progressing retention of fluid until this weekend when he got acutely worse.  He did see the cardiologist on Friday and increased torsemide to 40 mg daily.  Patient was advised to come into the ER that day, but did not because he wanted to finish his last radiation treatment at [facility] today.  Patient reports that the increase in torsemide has had no effect.  He reports exertional dyspnea and orthopnea.  He has been sleeping in the recliner chair at night.  He reports that his legs are heavy, especially on the left side.  He denies any chest pain, palpitations.  Reports he is thirsty all the time and drinking constantly.  Wife at bedside reports that he has 12-14 100 urine output daily and that has been the same over the weekend.  Patient reports scrotal swelling as well that has caused breakdown of the skin in the posterior scrotum.  He reports that that is painful.  Patient has no other complaints at this time.  Patient is a current smoker and reports that he smokes less than a pack per day.  He did request nicotine patch.  He is not vaccinated for Covid.  He is full code.  Pt was admitted with acute systolic heart failure that has failed outpatient therapy and has been difficult to manage generally.    Assessment & Plan:   Principal Problem:   CHF exacerbation ([place]) Active Problems:   Essential hypertension   DM (diabetes mellitus) ([place])    Atrial flutter ([place]) - s/p ablation   Paroxysmal atrial fibrillation (HCC)   Coronary artery disease involving native coronary artery of native heart without angina pectoris  1)Acute HFrEF -admitted with acute on chronic systolic dysfunction CHF/dilated cardiomyopathy --last known EF 30 to 35% based on echo from December 2021  --pt failed outpatient diuretic therapy  --Diuresing well with IV Lasix, voiding well overall, fatigue and dyspnea on exertion persist -Fluid balance remains negative -Isosorbide hydralazine combo per cardiology team -Patient is being switched from metoprolol to Coreg for better BP control and due to propensity towards bradycardia on metoprolol -Patient is intolerant to RAAS inhibition -Cardiology recommendations below noted -Continue Furosemide 40mg  BID  -Add Empagliflozin 10mg  daily -Stop Metoprolol Succinate and start Carvedilol 12.5mg  BID -Continue hydralazine/Isosorbide -Plan to re-challenge with Entresto in outpatient follow up   2)Essential Hypertension -anticipate further BP improvement with adjustments of medication as above #1   3)paroxysmal Atrial fibrillation/flutter - he remains on amiodarone , Toprol to be changed to Coreg as above #1-  he is not anticoagulated due to history of bladder cancer with hematuria and anemia.  ????-- if candidate for watchman device  4)Stage 3a CKD - monitoring BMP daily while on IV lasix diuresis.   Lab Results  Component Value Date   CREATININE 1.61 (H) 04/17/2020   CREATININE 1.62 (H) 04/16/2020   CREATININE 1.47 (H) 04/15/2020  5) CAD-- CAD s/p PCI to RCA in 2007, LAD and OM in 2019,  -Continue Lipitor, aspirin and Coreg -Remains chest pain-free  6)Bladder cancer - outpatient follow up after discharge for ongoing therapies.  I have asked for a  Palliative consult appreciated   7)type 2 diabetes mellitus - holding home glipizide, continue SSI coverage and CBG testing.  -May benefit from  empagliflozin  8)Tobacco abuser - -nicotine patch advised smoking cessation advised   9) chronic anemia--- suspect some degree of anemia of CKD  DVT prophylaxis: SQ heparin/SCDs Code Status: Full  Family Communication:  Disposition: Home  Status is: Inpatient  Remains inpatient appropriate because:IV treatments appropriate due to intensity of illness or inability to take PO and Inpatient level of care appropriate due to severity of illness  Dispo: The patient is from: Home              Anticipated d/c is to: Home              Anticipated d/c date is: 3 days              Patient currently is not medically stable to d/c.   Difficult to place patient No   Consultants:   Heartcare/cardiology  Procedures:   n/a  Antimicrobials:  n/a  Subjective: -Not voiding as well, -Orthopnea persist, dyspnea on exertion improving  Objective: Vitals:   04/17/20 0949 04/17/20 1330 04/17/20 1348 04/17/20 1659  BP: (!) 148/73 (!) 118/56 122/70 (!) 119/59  Pulse: (!) 54  60 (!) 54  Resp:   19   Temp:   98 F (36.7 C)   TempSrc:   Oral   SpO2:   99%   Weight:      Height:        Intake/Output Summary (Last 24 hours) at 04/17/2020 1832 Last data filed at 04/17/2020 1700 Gross per 24 hour  Intake 720 ml  Output 4050 ml  Net -3330 ml   Filed Weights   04/15/20 0147 04/16/20 0547 04/17/20 0544  Weight: 84.6 kg 89.1 kg 87.1 kg    Examination:   Physical Exam  Gen:- Awake Alert, no conversational dyspnea at rest HEENT:- [place].AT, No sclera icterus Neck-Supple Neck, +ve JVD,.  Lungs-diminished breath sounds, no wheeze  CV- S1, S2 normal, irregularly irregular Abd-  +ve B.Sounds, Abd Soft, No tenderness,    Extremity/Skin:- +ve  edema,    Psych-affect is appropriate, oriented x3 Neuro-denies weakness, no new focal deficits, no tremors   Data Reviewed: I have personally reviewed following labs and imaging studies  CBC: Recent Labs  Lab 04/14/20 1845 04/15/20 0530  04/17/20 0445  WBC 4.5 4.5 4.8  NEUTROABS 3.6 3.4  --   HGB 9.8* 9.8* 9.4*  HCT 31.9* 30.9* 31.5*  MCV 88.4 86.8 89.2  PLT 183 186 809    Basic Metabolic Panel: Recent Labs  Lab 04/14/20 1845 04/15/20 0530 04/16/20 0447 04/17/20 0445  NA 133* 136 141 138  K 3.6 3.0* 4.1 4.2  CL 95* 95* 98 98  CO2 29 31 30 29   GLUCOSE 183* 129* 70 161*  BUN 48* 42* 41* 36*  CREATININE 1.73* 1.47* 1.62* 1.61*  CALCIUM 8.6* 8.8* 9.2 9.0  MG  --  1.7 1.7 1.8    GFR: Estimated Creatinine Clearance: 44.2 mL/min (A) (by C-G formula based on SCr of 1.61 mg/dL (H)).  Liver Function Tests: Recent Labs  Lab 04/14/20 1845 04/15/20 0530  AST 27 20  ALT 41 35  ALKPHOS  213* 194*  BILITOT 0.5 0.6  PROT 6.4* 5.9*  ALBUMIN 2.8* 2.6*    CBG: Recent Labs  Lab 04/16/20 1658 04/16/20 2105 04/17/20 0757 04/17/20 1109 04/17/20 1534  GLUCAP 76 174* 145* 172* 108*    Recent Results (from the past 240 hour(s))  Resp Panel by RT-PCR (Flu A&B, Covid) Nasopharyngeal Swab     Status: None   Collection Time: 04/14/20  6:51 PM   Specimen: Nasopharyngeal Swab; Nasopharyngeal(NP) swabs in vial transport medium  Result Value Ref Range Status   SARS Coronavirus 2 by RT PCR NEGATIVE NEGATIVE Final    Comment: (NOTE) SARS-CoV-2 target nucleic acids are NOT DETECTED.  The SARS-CoV-2 RNA is generally detectable in upper respiratory specimens during the acute phase of infection. The lowest concentration of SARS-CoV-2 viral copies this assay can detect is 138 copies/mL. A negative result does not preclude SARS-Cov-2 infection and should not be used as the sole basis for treatment or other patient management decisions. A negative result may occur with  improper specimen collection/handling, submission of specimen other than nasopharyngeal swab, presence of viral mutation(s) within the areas targeted by this assay, and inadequate number of viral copies(<138 copies/mL). A negative result must be combined  with clinical observations, patient history, and epidemiological information. The expected result is Negative.  Fact Sheet for Patients:  EntrepreneurPulse.com.au  Fact Sheet for Healthcare Providers:  IncredibleEmployment.be  This test is no t yet approved or cleared by the [place] FDA and  has been authorized for detection and/or diagnosis of SARS-CoV-2 by FDA under an Emergency Use Authorization (EUA). This EUA will remain  in effect (meaning this test can be used) for the duration of the COVID-19 declaration under Section 564(b)(1) of the Act, 21 U.S.C.section 360bbb-3(b)(1), unless the authorization is terminated  or revoked sooner.       Influenza A by PCR NEGATIVE NEGATIVE Final   Influenza B by PCR NEGATIVE NEGATIVE Final    Comment: (NOTE) The Xpert Xpress SARS-CoV-2/FLU/RSV plus assay is intended as an aid in the diagnosis of influenza from Nasopharyngeal swab specimens and should not be used as a sole basis for treatment. Nasal washings and aspirates are unacceptable for Xpert Xpress SARS-CoV-2/FLU/RSV testing.  Fact Sheet for Patients: EntrepreneurPulse.com.au  Fact Sheet for Healthcare Providers: IncredibleEmployment.be  This test is not yet approved or cleared by the [place] FDA and has been authorized for detection and/or diagnosis of SARS-CoV-2 by FDA under an Emergency Use Authorization (EUA). This EUA will remain in effect (meaning this test can be used) for the duration of the COVID-19 declaration under Section 564(b)(1) of the Act, 21 U.S.C. section 360bbb-3(b)(1), unless the authorization is terminated or revoked.  Performed at [facility], [address] [place], [place] [address]   Culture, Urine     Status: None   Collection Time: 04/14/20 10:47 PM   Specimen: Urine, Clean Catch  Result Value Ref Range Status   Specimen Description   Final    URINE, CLEAN  CATCH Performed at [facility], [address], [place], [place] [address]    Special Requests   Final    NONE Performed at [facility], [address] [place], [place] [address]    Culture   Final    NO GROWTH Performed at [facility], [place] [address] [place], [place] [address]    Report Status 04/16/2020 FINAL  Final     Radiology Studies: No results found. Scheduled Meds: . amiodarone  200 mg Oral  Daily  . aspirin EC  81 mg Oral Daily  . atorvastatin  10 mg Oral Daily  . carvedilol  12.5 mg Oral BID WC  . empagliflozin  10 mg Oral Daily  . furosemide  40 mg Intravenous BID  . heparin  5,000 Units Subcutaneous Q8H  . hydrALAZINE  25 mg Oral Q8H  . insulin aspart  0-15 Units Subcutaneous TID WC  . insulin aspart  0-5 Units Subcutaneous QHS  . insulin aspart  5 Units Subcutaneous TID WC  . isosorbide mononitrate  30 mg Oral QHS  . nicotine  14 mg Transdermal Daily  . potassium chloride  40 mEq Oral BID  . silver sulfADIAZINE   Topical BID   Continuous Infusions:   LOS: 3 days   Roxan Hockey, MD How to contact the [facility] Attending or Consulting provider [place] or covering provider during after hours [place], for this patient?  1. Check the care team in [facility] and look for a) attending/consulting TRH provider listed and b) the [facility] team listed 2. Log into [email or web address] and use [place]'s universal password to access. If you do not have the password, please contact the hospital operator. 3. Locate the [facility] provider you are looking for under [facility] and page to a number that you can be directly reached. 4. If you still have difficulty reaching the provider, please page the [facility] (Director on Call) for the Hospitalists listed on amion for assistance.  04/17/2020, 6:32 PM

## 2020-04-17 NOTE — Progress Notes (Signed)
Progress Note  Patient Name: Richard [name] Date of Encounter: 04/17/2020  [facility] Cardiologist: Rozann Lesches, MD   Subjective   Patient reports that he had a much better diuretic response initially and is not very pleased with his current response.    Inpatient Medications    Scheduled Meds: . amiodarone  200 mg Oral Daily  . aspirin EC  81 mg Oral Daily  . atorvastatin  10 mg Oral Daily  . carvedilol  12.5 mg Oral BID WC  . empagliflozin  10 mg Oral Daily  . furosemide  40 mg Intravenous BID  . heparin  5,000 Units Subcutaneous Q8H  . hydrALAZINE  25 mg Oral Q8H  . insulin aspart  0-15 Units Subcutaneous TID WC  . insulin aspart  0-5 Units Subcutaneous QHS  . insulin aspart  5 Units Subcutaneous TID WC  . isosorbide mononitrate  30 mg Oral QHS  . nicotine  14 mg Transdermal Daily  . potassium chloride  40 mEq Oral BID  . silver sulfADIAZINE   Topical BID   Continuous Infusions:  PRN Meds: acetaminophen **OR** acetaminophen, ALPRAZolam, morphine injection, ondansetron **OR** ondansetron (ZOFRAN) IV, oxyCODONE, polyethylene glycol   Vital Signs    Vitals:   04/16/20 1942 04/17/20 0544 04/17/20 0813 04/17/20 0949  BP: (!) 149/78 (!) 157/81 (!) 155/80 (!) 148/73  Pulse: (!) 57 (!) 58 (!) 56 (!) 54  Resp: 18 18    Temp: 97.8 F (36.6 C) 97.7 F (36.5 C)    TempSrc: Oral Oral    SpO2: 96% 98%    Weight:  87.1 kg    Height:        Intake/Output Summary (Last 24 hours) at 04/17/2020 0825 Last data filed at 04/17/2020 0348 Gross per 24 hour  Intake -  Output 3100 ml  Net -3100 ml   Last 3 Weights 04/17/2020 04/16/2020 04/15/2020  Weight (lbs) 192 lb 0.3 oz 196 lb 6.9 oz 186 lb 8.2 oz  Weight (kg) 87.1 kg 89.1 kg 84.6 kg      Telemetry    Sinus bradycardia, occasional PVCs - Personally Reviewed  ECG    Normal sinus rhythm - Personally Reviewed  Physical Exam   GEN: No acute distress.   Neck: JVD visible while sitting upright to mid neck,  +hepatojugular reflex  Cardiac: RRR, no murmurs, rubs, or gallops. 2+ pitting edema to the thighs Respiratory: Clear to auscultation bilaterally. GI: Soft, nontender, mildly distended  MS: No joint edema; No deformity. Neuro:  Nonfocal  Psych: Normal affect   Labs    High Sensitivity Troponin:   Recent Labs  Lab 04/14/20 1845 04/14/20 2024  TROPONINIHS 20* 21*      Chemistry Recent Labs  Lab 04/14/20 1845 04/15/20 0530 04/16/20 0447 04/17/20 0445  NA 133* 136 141 138  K 3.6 3.0* 4.1 4.2  CL 95* 95* 98 98  CO2 29 31 30 29   GLUCOSE 183* 129* 70 161*  BUN 48* 42* 41* 36*  CREATININE 1.73* 1.47* 1.62* 1.61*  CALCIUM 8.6* 8.8* 9.2 9.0  PROT 6.4* 5.9*  --   --   ALBUMIN 2.8* 2.6*  --   --   AST 27 20  --   --   ALT 41 35  --   --   ALKPHOS 213* 194*  --   --   BILITOT 0.5 0.6  --   --   GFRNONAA 41* 50* 44* 45*  ANIONGAP 9 10 13  11  Hematology Recent Labs  Lab 04/14/20 1845 04/15/20 0530 04/17/20 0445  WBC 4.5 4.5 4.8  RBC 3.61* 3.56* 3.53*  HGB 9.8* 9.8* 9.4*  HCT 31.9* 30.9* 31.5*  MCV 88.4 86.8 89.2  MCH 27.1 27.5 26.6  MCHC 30.7 31.7 29.8*  RDW 21.4* 21.2* 21.6*  PLT 183 186 175    BNP Recent Labs  Lab 04/14/20 1845 04/17/20 0445  BNP 1,772.0* 1,536.0*     DDimer No results for input(s): DDIMER in the last 168 hours.   Radiology    No results found.  Cardiac Studies   Echocardiogram 01/24/2020: 1. Left ventricular ejection fraction, by estimation, is 30 to 35%. The  left ventricle has moderately decreased function. The left ventricle  demonstrates global hypokinesis. The left ventricular internal cavity size  was mildly dilated. Left ventricular  diastolic parameters are consistent with Grade I diastolic dysfunction  (impaired relaxation). Elevated left atrial pressure.  2. Right ventricular systolic function is normal. The right ventricular  size is normal.  3. The mitral valve is normal in structure. Mild mitral valve   regurgitation. No evidence of mitral stenosis.  4. The aortic valve is tricuspid. Aortic valve regurgitation is not  visualized. Mild aortic valve sclerosis is present, with no evidence of  aortic valve stenosis.  5. The inferior vena cava is normal in size with greater than 50%  respiratory variability, suggesting right atrial pressure of 3 mmHg.   Coronary angiography 10/17/2017  Prox RCA lesion is 15% stenosed.  Mid RCA lesion is 30% stenosed.  Mid LM lesion is 25% stenosed.  Mid LAD lesion is 85% stenosed.  A drug-eluting stent was successfully placed using a STENT SYNERGY DES 2.75X16.  Post intervention, there is a 0% residual stenosis.  Ost 2nd Mrg lesion is 50% stenosed.  Mid Cx lesion is 45% stenosed.  3rd Mrg lesion is 85% stenosed.  A drug-eluting stent was successfully placed using a STENT SYNERGY DES 3X24.  Post intervention, there is a 0% residual stenosis.  LV end diastolic pressure is normal.   1. Severe 2 vessel obstructive CAD- patient has diffuse CAD    - 85% mid LAD    - 85% large OM2 2. Patent stents in RCA.  3. Normal LVEDP 4. Successful PCI of the mid LAD with DES 5. Successful PCI of the OM2  Plan: aggressive risk factor modification. Need to stress compliance with medical therapy. DAPT for one year. Since he has no documented atrial arrhythmia since his ablation I would not resume DOAC. Anticipate DC in am  Recommend uninterrupted dual antiplatelet therapy with Aspirin 81mg  daily and Ticagrelor 90mg  twice daily for a minimum of 12 months (ACS - Class I recommendation).   Patient Profile     Richard [name] is a 75 y.o. male with a hx of chronic systolic CHF who is being seen today for the evaluation of acute on chronic systolic CHF at the request of Dr Wynetta Emery.   Assessment & Plan    Richard [name] is a 75 year old man with h/o ischemic dilated cardiomyopathy, coronary artery disease s/p PCI RCA, LAD, and OM as well as atrial flutter and  atrial fibrillation who is admitted for acute decompensated heart failure.   1. Dilated ischemic cardiomyopathy, acutely decompensated  Pt with ischemic cardiomyopathy and baseline NYHA class II symptoms admitted for acute decompensated heart failure and failure to respond to escalation of outpatient diuretics. He is respondign well to IV furosemide and is net negative 6.5L since  admission. Will plant to continue IV furosemide with goal net negative 1-2L overnight. Will transition to carvedilol from Metoprolol given bradycardia and HTN. Will also plan to continue isosorbide/hydralazine and consider transition to BiDil as an outpatient as patient is intolerant of RAAS inhibition. Will plan to re-challange with Entresto in outpatient follow up. Will add empagliflozin today.  -Continue Furosemide 40mg  BID  -Add Empagliflozin 10mg  daily -Stop Metoprolol Succinate and start Carvedilol 12.5mg  BID -Continue hydralazine/Isosorbide -Plan to re-challenge with Entresto in outpatient follow up  2. Coronary artery disease  Pt with h/o CAD s/p PCI to RCA in 2007, LAD and OM in 2019, currently angina free. Will continue ASA and Lipitor. Will continue outpatient antianginals.   3. HTN Pt with persistent hypertension despite increase in Hydralazine to TID. Will plan to transition to carvedilol as above.   4. Atrial fibrillation, paroxysmal non-valvular  Pt with h/o paroxysmal non-valvular atrial fibrillation, in sinus rhythm today on amiodarone. Will continue Amiodarone for rhythm control at this time. Although CHA2DS2-Vasc 5 patient is not on systemic anticoagulation due to history of bleeding r/t bladder tumor. Will continue to reassess bleeding risk in follow up.     For questions or updates, please contact [place] Please consult [email or web address] for contact info under        Signed, Memory Dance, MD, Rosalita Chessman [facility] 04/17/2020, 12:38 PM

## 2020-04-17 NOTE — Progress Notes (Signed)
Initial Nutrition Assessment  DOCUMENTATION CODES:      INTERVENTION:  Low Sodium diet education   RD to follow up with pt spouse to address her questions as needed    NUTRITION DIAGNOSIS:   Food and nutrition related knowledge deficit related to limited prior education as evidenced by per patient/family report.  GOAL:   (Patient will modify usual diet to decrease overall sodium intake)   MONITOR:  PO intake,I & O's,Labs,Skin,Weight trends    REASON FOR ASSESSMENT:   Consult Assessment of nutrition requirement/status  ASSESSMENT: patient is a 75 yo male with hx of CAD, HTN and chronic HF (EF 30-35%). He presents with acute decompensation HF.   Patient home diet - regular. No family at bedside. His wife does most of the food preparation. Diet recall: pt has boil egg, toast and coffee for breakfast, a salad, sandwich or soup lunch and dinner varies. Handout provided - patient says he will give it to his wife for review. RD contact information included. Handout: Heart Failure Nutrition Therapy".   Patient endorses daily weights. Currently 87 kg. Usual weight based on chart 81-82 kg the past 6 weeks.    Intake/Output Summary (Last 24 hours) at 04/17/2020 1407 Last data filed at 04/17/2020 1300 Gross per 24 hour  Intake 240 ml  Output 3800 ml  Net -3560 ml    Net since admission- (-6.531 liters)   Labs reviewed:  BMP Latest Ref Rng & Units 04/17/2020 04/16/2020 04/15/2020  Glucose 70 - 99 mg/dL 161(H) 70 129(H)  BUN 8 - 23 mg/dL 36(H) 41(H) 42(H)  Creatinine 0.61 - 1.24 mg/dL 1.61(H) 1.62(H) 1.47(H)  Sodium 135 - 145 mmol/L 138 141 136  Potassium 3.5 - 5.1 mmol/L 4.2 4.1 3.0(L)  Chloride 98 - 111 mmol/L 98 98 95(L)  CO2 22 - 32 mmol/L 29 30 31   Calcium 8.9 - 10.3 mg/dL 9.0 9.2 8.8(L)     Medications reviewed and include: lasix, jardiance, Lipitor.   NUTRITION - FOCUSED PHYSICAL EXAM: Mild subcutaneous fat loss and moderate muscle depletion, moderate pitting edema BLE.    Diet Order:   Diet Order            Diet heart healthy/carb modified Room service appropriate? Yes; Fluid consistency: Thin; Fluid restriction: 1800 mL Fluid  Diet effective now                 EDUCATION NEEDS:  Education needs have been addressed (Low Sodium)  Skin:  Skin Assessment: Reviewed RN Assessment  Last BM:  2/24  Height:   Ht Readings from Last 1 Encounters:  04/15/20 6' (1.829 m)    Weight:   Wt Readings from Last 1 Encounters:  04/17/20 87.1 kg    Ideal Body Weight:   81 kg  BMI:  Body mass index is 26.04 kg/m.  Estimated Nutritional Needs: Based on EDW-81 kg  Kcal:  2025-2268  Protein:  95-99 gr  Fluid:  1.8 liters fluid daily    Colman Cater MS,RD,CSG,LDN Pager: Shea Evans

## 2020-04-18 DIAGNOSIS — I255 Ischemic cardiomyopathy: Secondary | ICD-10-CM

## 2020-04-18 DIAGNOSIS — I480 Paroxysmal atrial fibrillation: Secondary | ICD-10-CM | POA: Diagnosis not present

## 2020-04-18 DIAGNOSIS — N1832 Chronic kidney disease, stage 3b: Secondary | ICD-10-CM

## 2020-04-18 DIAGNOSIS — I5043 Acute on chronic combined systolic (congestive) and diastolic (congestive) heart failure: Secondary | ICD-10-CM | POA: Diagnosis not present

## 2020-04-18 DIAGNOSIS — I2510 Atherosclerotic heart disease of native coronary artery without angina pectoris: Secondary | ICD-10-CM

## 2020-04-18 DIAGNOSIS — I10 Essential (primary) hypertension: Secondary | ICD-10-CM | POA: Diagnosis not present

## 2020-04-18 DIAGNOSIS — I5023 Acute on chronic systolic (congestive) heart failure: Secondary | ICD-10-CM | POA: Diagnosis not present

## 2020-04-18 LAB — GLUCOSE, CAPILLARY
Glucose-Capillary: 160 mg/dL — ABNORMAL HIGH (ref 70–99)
Glucose-Capillary: 124 mg/dL — ABNORMAL HIGH (ref 70–99)
Glucose-Capillary: 125 mg/dL — ABNORMAL HIGH (ref 70–99)
Glucose-Capillary: 85 mg/dL (ref 70–99)

## 2020-04-18 LAB — BASIC METABOLIC PANEL
Anion gap: 8 (ref 5–15)
BUN: 32 mg/dL — ABNORMAL HIGH (ref 8–23)
CO2: 31 mmol/L (ref 22–32)
Calcium: 8.8 mg/dL — ABNORMAL LOW (ref 8.9–10.3)
Chloride: 99 mmol/L (ref 98–111)
Creatinine, Ser: 1.74 mg/dL — ABNORMAL HIGH (ref 0.61–1.24)
GFR, Estimated: 41 mL/min — ABNORMAL LOW (ref >60)
Glucose, Bld: 172 mg/dL — ABNORMAL HIGH (ref 70–99)
Potassium: 4.2 mmol/L (ref 3.5–5.1)
Sodium: 138 mmol/L (ref 135–145)

## 2020-04-18 LAB — MAGNESIUM: Magnesium: 1.8 mg/dL (ref 1.7–2.4)

## 2020-04-18 MED ORDER — ISOSORB DINITRATE-HYDRALAZINE 20-37.5 MG PO TABS
1.0000 | ORAL_TABLET | Freq: Three times a day (TID) | ORAL | Status: DC
  Administered 2020-04-18 – 2020-04-20 (×7): 1 via ORAL
  Filled 2020-04-18 (×7): qty 1

## 2020-04-18 NOTE — Care Management Important Message (Signed)
Important Message  Patient Details  Name: Richard [name] MRN: [id number] Date of Birth: 1945-07-06   Medicare Important Message Given:  Yes     Tommy Medal 04/18/2020, 2:54 PM

## 2020-04-18 NOTE — Progress Notes (Signed)
PROGRESS NOTE   Richard [name]  HYQ:[id number] DOB: 1945-09-12 DOA: 04/14/2020 PCP: Wannetta Sender, FNP   Chief Complaint  Patient presents with  . Leg Swelling   Level of care: Telemetry  Brief Admission History:  75 y.o. 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  Patient reports that he started retaining fluid 2 weeks ago.  His dry weight is 180 pounds and currently he is 194 pounds per his report.  Patient reports that its been a slowly progressing retention of fluid until this weekend when he got acutely worse.  He did see the cardiologist on Friday and increased torsemide to 40 mg daily.  Patient was advised to come into the ER that day, but did not because he wanted to finish his last radiation treatment at [facility] today.  Patient reports that the increase in torsemide has had no effect.  He reports exertional dyspnea and orthopnea.  He has been sleeping in the recliner chair at night.  He reports that his legs are heavy, especially on the left side.  He denies any chest pain, palpitations.  Reports he is thirsty all the time and drinking constantly.  Wife at bedside reports that he has 12-14 100 urine output daily and that has been the same over the weekend.  Patient reports scrotal swelling as well that has caused breakdown of the skin in the posterior scrotum.  He reports that that is painful.  Patient has no other complaints at this time.  Patient is a current smoker and reports that he smokes less than a pack per day.  He did request nicotine patch.  He is not vaccinated for Covid.  He is full code.  Pt was admitted with acute systolic heart failure that has failed outpatient therapy and has been difficult to manage generally.    Assessment & Plan:   Principal Problem:   CHF exacerbation ([place]) Active Problems:   Essential hypertension   DM (diabetes mellitus) ([place])    Atrial flutter (HCC) - s/p ablation   Paroxysmal atrial fibrillation (HCC)   Coronary artery disease involving native coronary artery of native heart without angina pectoris  1)Acute HFrEF -admitted with acute on chronic systolic dysfunction CHF/dilated cardiomyopathy --last known EF 30 to 35% based on echo from December 2021  --pt failed outpatient diuretic therapy  --Patient continues to diurese well with IV Lasix, voiding well overall, fatigue and dyspnea on exertion improving---possible transition to oral torsemide over the next 24 to 48 hours -Fluid balance remains negative -Patient's weight scale appears to be inaccurate -Isosorbide hydralazine combo per cardiology team -Patient is being switched from metoprolol to Coreg for better BP control and due to propensity towards bradycardia on metoprolol -Patient is intolerant to RAAS inhibition -Cardiology recommendations below noted -Continue Furosemide 40mg  BID  -Added Empagliflozin 10mg  daily -Stop Metoprolol Succinate and start Carvedilol 12.5mg  BID -Continue hydralazine/Isosorbide -Plan to re-challenge with Entresto in outpatient follow up   2)Essential Hypertension -anticipate further BP improvement with adjustments of medication as above #1   3)paroxysmal Atrial fibrillation/flutter - he remains on amiodarone , Toprol to be changed to Coreg as above #1-  he is not anticoagulated due to history of bladder cancer with hematuria and anemia.  ????-- if candidate for watchman device  4)Stage 3a CKD - monitoring BMP daily while on IV lasix diuresis.   Lab Results  Component  Value Date   CREATININE 1.74 (H) 04/18/2020   CREATININE 1.61 (H) 04/17/2020   CREATININE 1.62 (H) 04/16/2020    5) CAD-- CAD s/p PCI to RCA in 2007, LAD and OM in 2019,  -Continue Lipitor, aspirin and Coreg -Remains chest pain-free  6)Bladder cancer - outpatient follow up after discharge for ongoing therapies.  I have asked for a  Palliative consult  appreciated   7)type 2 diabetes mellitus - holding home glipizide, continue SSI coverage and CBG testing.  -May benefit from empagliflozin  8)Tobacco abuser - -nicotine patch advised smoking cessation advised   9) chronic anemia--- suspect some degree of anemia of CKD  DVT prophylaxis: SQ heparin/SCDs Code Status: Full  Family Communication:  Disposition: Home  Status is: Inpatient  Remains inpatient appropriate because:IV treatments appropriate due to intensity of illness or inability to take PO and Inpatient level of care appropriate due to severity of illness  Dispo: The patient is from: Home              Anticipated d/c is to: Home              Anticipated d/c date is: 3 days              Patient currently is not medically stable to d/c.   Difficult to place patient No   Consultants:   Heartcare/cardiology  Procedures:   n/a  Antimicrobials:  n/a  Subjective: - Orthopnea persist, dyspnea on exertion is not worse - Voiding okay no chest pains  Objective: Vitals:   04/18/20 0452 04/18/20 0500 04/18/20 1047 04/18/20 1332  BP: 140/69  131/71 126/60  Pulse: (!) 56  (!) 55 (!) 54  Resp: 18   18  Temp: 98.2 F (36.8 C)   (!) 97.4 F (36.3 C)  TempSrc: Oral   Oral  SpO2: 98%   97%  Weight:  89.1 kg    Height:        Intake/Output Summary (Last 24 hours) at 04/18/2020 1724 Last data filed at 04/18/2020 1300 Gross per 24 hour  Intake 290 ml  Output 2101 ml  Net -1811 ml   Filed Weights   04/16/20 0547 04/17/20 0544 04/18/20 0500  Weight: 89.1 kg 87.1 kg 89.1 kg    Examination:   Physical Exam  Gen:- Awake Alert, some dyspnea on exertion HEENT:- [place].AT, No sclera icterus Neck-Supple Neck, +ve JVD,.  Lungs-diminished breath sounds, no wheeze  CV- S1, S2 normal, irregularly irregular Abd-  +ve B.Sounds, Abd Soft, No tenderness,    Extremity/Skin:- +ve  edema,    Psych-affect is appropriate, oriented x3 Neuro-generalized weakness, no new focal  deficits, no tremors   Data Reviewed: I have personally reviewed following labs and imaging studies  CBC: Recent Labs  Lab 04/14/20 1845 04/15/20 0530 04/17/20 0445  WBC 4.5 4.5 4.8  NEUTROABS 3.6 3.4  --   HGB 9.8* 9.8* 9.4*  HCT 31.9* 30.9* 31.5*  MCV 88.4 86.8 89.2  PLT 183 186 458    Basic Metabolic Panel: Recent Labs  Lab 04/14/20 1845 04/15/20 0530 04/16/20 0447 04/17/20 0445 04/18/20 0528  NA 133* 136 141 138 138  K 3.6 3.0* 4.1 4.2 4.2  CL 95* 95* 98 98 99  CO2 29 31 30 29 31   GLUCOSE 183* 129* 70 161* 172*  BUN 48* 42* 41* 36* 32*  CREATININE 1.73* 1.47* 1.62* 1.61* 1.74*  CALCIUM 8.6* 8.8* 9.2 9.0 8.8*  MG  --  1.7 1.7 1.8 1.8  GFR: Estimated Creatinine Clearance: 40.9 mL/min (A) (by C-G formula based on SCr of 1.74 mg/dL (H)).  Liver Function Tests: Recent Labs  Lab 04/14/20 1845 04/15/20 0530  AST 27 20  ALT 41 35  ALKPHOS 213* 194*  BILITOT 0.5 0.6  PROT 6.4* 5.9*  ALBUMIN 2.8* 2.6*    CBG: Recent Labs  Lab 04/17/20 1534 04/17/20 2108 04/18/20 0730 04/18/20 1114 04/18/20 1620  GLUCAP 108* 248* 160* 124* 125*    Recent Results (from the past 240 hour(s))  Resp Panel by RT-PCR (Flu A&B, Covid) Nasopharyngeal Swab     Status: None   Collection Time: 04/14/20  6:51 PM   Specimen: Nasopharyngeal Swab; Nasopharyngeal(NP) swabs in vial transport medium  Result Value Ref Range Status   SARS Coronavirus 2 by RT PCR NEGATIVE NEGATIVE Final    Comment: (NOTE) SARS-CoV-2 target nucleic acids are NOT DETECTED.  The SARS-CoV-2 RNA is generally detectable in upper respiratory specimens during the acute phase of infection. The lowest concentration of SARS-CoV-2 viral copies this assay can detect is 138 copies/mL. A negative result does not preclude SARS-Cov-2 infection and should not be used as the sole basis for treatment or other patient management decisions. A negative result may occur with  improper specimen collection/handling,  submission of specimen other than nasopharyngeal swab, presence of viral mutation(s) within the areas targeted by this assay, and inadequate number of viral copies(<138 copies/mL). A negative result must be combined with clinical observations, patient history, and epidemiological information. The expected result is Negative.  Fact Sheet for Patients:  EntrepreneurPulse.com.au  Fact Sheet for Healthcare Providers:  IncredibleEmployment.be  This test is no t yet approved or cleared by the [place] FDA and  has been authorized for detection and/or diagnosis of SARS-CoV-2 by FDA under an Emergency Use Authorization (EUA). This EUA will remain  in effect (meaning this test can be used) for the duration of the COVID-19 declaration under Section 564(b)(1) of the Act, 21 U.S.C.section 360bbb-3(b)(1), unless the authorization is terminated  or revoked sooner.       Influenza A by PCR NEGATIVE NEGATIVE Final   Influenza B by PCR NEGATIVE NEGATIVE Final    Comment: (NOTE) The Xpert Xpress SARS-CoV-2/FLU/RSV plus assay is intended as an aid in the diagnosis of influenza from Nasopharyngeal swab specimens and should not be used as a sole basis for treatment. Nasal washings and aspirates are unacceptable for Xpert Xpress SARS-CoV-2/FLU/RSV testing.  Fact Sheet for Patients: EntrepreneurPulse.com.au  Fact Sheet for Healthcare Providers: IncredibleEmployment.be  This test is not yet approved or cleared by the [place] FDA and has been authorized for detection and/or diagnosis of SARS-CoV-2 by FDA under an Emergency Use Authorization (EUA). This EUA will remain in effect (meaning this test can be used) for the duration of the COVID-19 declaration under Section 564(b)(1) of the Act, 21 U.S.C. section 360bbb-3(b)(1), unless the authorization is terminated or revoked.  Performed at [facility], [address] [place], [place] [address]   Culture, Urine     Status: None   Collection Time: 04/14/20 10:47 PM   Specimen: Urine, Clean Catch  Result Value Ref Range Status   Specimen Description   Final    URINE, CLEAN CATCH Performed at [facility], [address] [place], [place] [address]    Special Requests   Final    NONE Performed at [facility], [address], [place], [place] [address]    Culture   Final    NO GROWTH  Performed at [facility], [place] [address], [place], [place] [address]    Report Status 04/16/2020 FINAL  Final     Radiology Studies: No results found. Scheduled Meds: . amiodarone  200 mg Oral Daily  . aspirin EC  81 mg Oral Daily  . atorvastatin  10 mg Oral Daily  . carvedilol  12.5 mg Oral BID WC  . empagliflozin  10 mg Oral Daily  . furosemide  40 mg Intravenous BID  . heparin  5,000 Units Subcutaneous Q8H  . insulin aspart  0-15 Units Subcutaneous TID WC  . insulin aspart  0-5 Units Subcutaneous QHS  . insulin aspart  5 Units Subcutaneous TID WC  . isosorbide-hydrALAZINE  1 tablet Oral TID  . nicotine  14 mg Transdermal Daily  . potassium chloride  40 mEq Oral BID  . silver sulfADIAZINE   Topical BID   Continuous Infusions:   LOS: 4 days   Roxan Hockey, MD How to contact the [facility] Attending or Consulting provider [place] or covering provider during after hours [place], for this patient?  1. Check the care team in [facility] and look for a) attending/consulting [facility] provider listed and b) the [facility] team listed 2. Log into [email or web address] and use [place] universal password to access. If you do not have the password, please contact the hospital operator. 3. Locate the [facility] provider you are looking for under [facility] and page to a number that you can be directly reached. 4. If you still have difficulty reaching the provider, please page the [facility] (Director on Call) for the Hospitalists listed on amion for assistance.  04/18/2020, 5:24 PM

## 2020-04-18 NOTE — Plan of Care (Signed)

## 2020-04-18 NOTE — Progress Notes (Signed)
Progress Note  Patient Name: Richard [name] Date of Encounter: 04/18/2020  Primary Cardiologist: Rozann Lesches, MD  Subjective   Leg swelling and scrotal edema gradually improving by the day.  Not short of breath at rest.  Inpatient Medications    Scheduled Meds: . amiodarone  200 mg Oral Daily  . aspirin EC  81 mg Oral Daily  . atorvastatin  10 mg Oral Daily  . carvedilol  12.5 mg Oral BID WC  . empagliflozin  10 mg Oral Daily  . furosemide  40 mg Intravenous BID  . heparin  5,000 Units Subcutaneous Q8H  . insulin aspart  0-15 Units Subcutaneous TID WC  . insulin aspart  0-5 Units Subcutaneous QHS  . insulin aspart  5 Units Subcutaneous TID WC  . isosorbide-hydrALAZINE  1 tablet Oral TID  . nicotine  14 mg Transdermal Daily  . potassium chloride  40 mEq Oral BID  . silver sulfADIAZINE   Topical BID    PRN Meds: acetaminophen **OR** acetaminophen, ALPRAZolam, morphine injection, ondansetron **OR** ondansetron (ZOFRAN) IV, oxyCODONE, polyethylene glycol   Vital Signs    Vitals:   04/17/20 1659 04/17/20 2107 04/18/20 0452 04/18/20 0500  BP: (!) 119/59 (!) 145/59 140/69   Pulse: (!) 54 (!) 54 (!) 56   Resp:   18   Temp:  97.6 F (36.4 C) 98.2 F (36.8 C)   TempSrc:  Oral Oral   SpO2:  99% 98%   Weight:    89.1 kg  Height:        Intake/Output Summary (Last 24 hours) at 04/18/2020 1008 Last data filed at 04/18/2020 0900 Gross per 24 hour  Intake 770 ml  Output 2801 ml  Net -2031 ml   Filed Weights   04/16/20 0547 04/17/20 0544 04/18/20 0500  Weight: 89.1 kg 87.1 kg 89.1 kg    Telemetry    Sinus rhythm.  Personally reviewed.  ECG    No ECG reviewed.  Physical Exam   GEN: No acute distress.   Neck: No JVD. Cardiac: RRR, no gallop.  Respiratory: Nonlabored. Clear to auscultation bilaterally. GI: Soft, nontender, bowel sounds present. MS:  Improving bilateral leg edema.  Labs    Chemistry Recent Labs  Lab 04/14/20 1845 04/15/20 0530  04/16/20 0447 04/17/20 0445 04/18/20 0528  NA 133* 136 141 138 138  K 3.6 3.0* 4.1 4.2 4.2  CL 95* 95* 98 98 99  CO2 29 31 30 29 31   GLUCOSE 183* 129* 70 161* 172*  BUN 48* 42* 41* 36* 32*  CREATININE 1.73* 1.47* 1.62* 1.61* 1.74*  CALCIUM 8.6* 8.8* 9.2 9.0 8.8*  PROT 6.4* 5.9*  --   --   --   ALBUMIN 2.8* 2.6*  --   --   --   AST 27 20  --   --   --   ALT 41 35  --   --   --   ALKPHOS 213* 194*  --   --   --   BILITOT 0.5 0.6  --   --   --   GFRNONAA 41* 50* 44* 45* 41*  ANIONGAP 9 10 13 11 8      Hematology Recent Labs  Lab 04/14/20 1845 04/15/20 0530 04/17/20 0445  WBC 4.5 4.5 4.8  RBC 3.61* 3.56* 3.53*  HGB 9.8* 9.8* 9.4*  HCT 31.9* 30.9* 31.5*  MCV 88.4 86.8 89.2  MCH 27.1 27.5 26.6  MCHC 30.7 31.7 29.8*  RDW 21.4* 21.2* 21.6*  PLT  183 186 175    Cardiac Enzymes Recent Labs  Lab 04/14/20 1845 04/14/20 2024  TROPONINIHS 20* 21*    BNP Recent Labs  Lab 04/14/20 1845 04/17/20 0445  BNP 1,772.0* 1,536.0*     Radiology    No results found.  Cardiac Studies   Echocardiogram 01/24/2020: 1. Left ventricular ejection fraction, by estimation, is 30 to 35%. The  left ventricle has moderately decreased function. The left ventricle  demonstrates global hypokinesis. The left ventricular internal cavity size  was mildly dilated. Left ventricular  diastolic parameters are consistent with Grade I diastolic dysfunction  (impaired relaxation). Elevated left atrial pressure.  2. Right ventricular systolic function is normal. The right ventricular  size is normal.  3. The mitral valve is normal in structure. Mild mitral valve  regurgitation. No evidence of mitral stenosis.  4. The aortic valve is tricuspid. Aortic valve regurgitation is not  visualized. Mild aortic valve sclerosis is present, with no evidence of  aortic valve stenosis.  5. The inferior vena cava is normal in size with greater than 50%  respiratory variability, suggesting right atrial  pressure of 3 mmHg  Patient Profile     75 y.o. 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  He presents with fluid overload with decompensated systolic heart failure also complicated by low protein state.  Assessment & Plan    1.  Ischemic cardiomyopathy with acute on chronic systolic heart failure.  He had approximately 2000 cc out more than in the last 24 hours on IV Lasix.  He continues to show clinical improvement, but not back to baseline as yet.  BNP 1536 yesterday.  He is currently on Coreg, BiDil, IV Lasix with potassium supplements, and Jardiance.  2.  CKD stage IIIb, creatinine has increased from 1.61 up to 1.74, potassium normal.  3.  CAD status post previous DES interventions to the RCA, LAD, and OM 2.  No active angina at this time.  Continue aspirin and Lipitor.  4.  Essential hypertension.  5.  History of atrial flutter ablation and paroxysmal atrial fibrillation.  He is not anticoagulated given history of recurrent hematuria with bladder cancer.  Continue amiodarone, remains in sinus rhythm at this time.  6.  Protein calorie malnutrition, albumin 2.6 and total protein 5.9.  Also likely contributing to fluid retention.  Continue current dose of IV Lasix with potassium supplement today.  Within the next 24 to 48 hours can likely transition to oral Demadex.  Agree with nutritional consultation - I wonder if he would benefit from increasing protein supplements.  Continue Coreg, now on BiDil as well.  Titrate as blood pressure will tolerate.  Continue Jardiance.  Depending on renal function with more optimal fluid status, might even be able to consider starting low-dose Entresto, but would have to follow this closely.  Signed, Rozann Lesches, MD  04/18/2020,  10:08 AM

## 2020-04-19 DIAGNOSIS — I5043 Acute on chronic combined systolic (congestive) and diastolic (congestive) heart failure: Secondary | ICD-10-CM | POA: Diagnosis not present

## 2020-04-19 DIAGNOSIS — E1165 Type 2 diabetes mellitus with hyperglycemia: Secondary | ICD-10-CM

## 2020-04-19 DIAGNOSIS — I2510 Atherosclerotic heart disease of native coronary artery without angina pectoris: Secondary | ICD-10-CM | POA: Diagnosis not present

## 2020-04-19 LAB — BASIC METABOLIC PANEL
Anion gap: 11 (ref 5–15)
BUN: 32 mg/dL — ABNORMAL HIGH (ref 8–23)
CO2: 30 mmol/L (ref 22–32)
Calcium: 9.2 mg/dL (ref 8.9–10.3)
Chloride: 98 mmol/L (ref 98–111)
Creatinine, Ser: 1.7 mg/dL — ABNORMAL HIGH (ref 0.61–1.24)
GFR, Estimated: 42 mL/min — ABNORMAL LOW (ref >60)
Glucose, Bld: 168 mg/dL — ABNORMAL HIGH (ref 70–99)
Potassium: 5.2 mmol/L — ABNORMAL HIGH (ref 3.5–5.1)
Sodium: 139 mmol/L (ref 135–145)

## 2020-04-19 LAB — GLUCOSE, CAPILLARY
Glucose-Capillary: 188 mg/dL — ABNORMAL HIGH (ref 70–99)
Glucose-Capillary: 170 mg/dL — ABNORMAL HIGH (ref 70–99)
Glucose-Capillary: 84 mg/dL (ref 70–99)
Glucose-Capillary: 166 mg/dL — ABNORMAL HIGH (ref 70–99)

## 2020-04-19 LAB — MAGNESIUM: Magnesium: 1.7 mg/dL (ref 1.7–2.4)

## 2020-04-19 MED ORDER — TORSEMIDE 20 MG PO TABS
40.0000 mg | ORAL_TABLET | Freq: Two times a day (BID) | ORAL | Status: DC
  Administered 2020-04-19 – 2020-04-20 (×2): 40 mg via ORAL
  Filled 2020-04-19 (×2): qty 2

## 2020-04-19 MED ORDER — SODIUM ZIRCONIUM CYCLOSILICATE 10 G PO PACK
10.0000 g | PACK | Freq: Once | ORAL | Status: AC
  Administered 2020-04-19: 10 g via ORAL
  Filled 2020-04-19: qty 1

## 2020-04-19 MED ORDER — MAGNESIUM SULFATE 2 GM/50ML IV SOLN
2.0000 g | Freq: Once | INTRAVENOUS | Status: AC
  Administered 2020-04-19: 2 g via INTRAVENOUS
  Filled 2020-04-19: qty 50

## 2020-04-19 NOTE — Progress Notes (Signed)
PROGRESS NOTE   Richard [name]  TRR:[id number] DOB: 1945-08-28 DOA: 04/14/2020 PCP: Wannetta Sender, FNP   Chief Complaint  Patient presents with  . Leg Swelling   Level of care: Telemetry  Brief Admission History:  75 y.o. 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  Patient reports that he started retaining fluid 2 weeks ago.  His dry weight is 180 pounds and currently he is 194 pounds per his report.  Patient reports that its been a slowly progressing retention of fluid until this weekend when he got acutely worse.  He did see the cardiologist on Friday and increased torsemide to 40 mg daily.  Patient was advised to come into the ER that day, but did not because he wanted to finish his last radiation treatment at [facility] today.  Patient reports that the increase in torsemide has had no effect.  He reports exertional dyspnea and orthopnea.  He has been sleeping in the recliner chair at night.  He reports that his legs are heavy, especially on the left side.  He denies any chest pain, palpitations.  Reports he is thirsty all the time and drinking constantly.  Wife at bedside reports that he has 12-14 100 urine output daily and that has been the same over the weekend.  Patient reports scrotal swelling as well that has caused breakdown of the skin in the posterior scrotum.  He reports that that is painful.  Patient has no other complaints at this time.  Patient is a current smoker and reports that he smokes less than a pack per day.  He did request nicotine patch.  He is not vaccinated for Covid.  He is full code.  Pt was admitted with acute systolic heart failure that has failed outpatient therapy and has been difficult to manage generally.    Assessment & Plan:   Principal Problem:   CHF exacerbation ([place]) Active Problems:   Essential hypertension   DM (diabetes mellitus) ([place])    Atrial flutter (HCC) - s/p ablation   Paroxysmal atrial fibrillation (HCC)   Coronary artery disease involving native coronary artery of native heart without angina pectoris  1)Acute HFrEF -admitted with acute on chronic systolic dysfunction CHF/dilated cardiomyopathy --last known EF 30 to 35% based on echo from December 2021  --pt failed outpatient diuretic therapy  --Patient continues to diurese well , voiding well overall, fatigue and dyspnea on exertion improving--- -Fluid balance remains negative -Patient's weight scale appears to be inaccurate -Isosorbide hydralazine combo per cardiology team -Patient is being switched from metoprolol to Coreg for better BP control and due to propensity towards bradycardia on metoprolol -Patient is intolerant to RAAS inhibition -Cardiology recommendations below noted -will STOP iv  Furosemide 40mg  BID and transition to p.o. torsemide 40 mg twice daily first dose this evening -Added Empagliflozin 10mg  daily -Stopped Metoprolol Succinate and started Carvedilol 12.5mg  BID -Continue hydralazine/Isosorbide -Plan to re-challenge with Entresto in outpatient follow up -Possible discharge home over the next 24 to 48 hours if does well with oral diuretics -All of cardiology appointment already given for Tuesday, 04/23/2019   2)Essential Hypertension -anticipate further BP improvement with adjustments of medication as above #1   3)paroxysmal Atrial fibrillation/flutter - he remains on amiodarone , Toprol to be changed to Coreg as above #1-  he is not anticoagulated due to history of bladder cancer with hematuria and  anemia.  ????-- if candidate for watchman device  4)Stage 3a CKD - monitoring BMP daily while on diuretics.   Lab Results  Component Value Date   CREATININE 1.70 (H) 04/19/2020   CREATININE 1.74 (H) 04/18/2020   CREATININE 1.61 (H) 04/17/2020    5) CAD-- CAD s/p PCI to RCA in 2007, LAD and OM in 2019,  -Continue Lipitor, aspirin and  Coreg -Remains chest pain-free  6)Bladder cancer - outpatient follow up after discharge for ongoing therapies.  I have asked for a  Palliative consult appreciated   7)type 2 diabetes mellitus - holding home glipizide, continue SSI coverage and CBG testing.  -May benefit from empagliflozin  8)Tobacco abuser - -nicotine patch advised smoking cessation advised   9) chronic anemia--- suspect some degree of anemia of CKD, hemoglobin currently stable above nine  DVT prophylaxis: SQ heparin/SCDs Code Status: Full  Family Communication: Discussed with patient's wife and patient's daughter Abigail Butts Disposition: Home  Status is: Inpatient  Remains inpatient appropriate because:IV treatments appropriate due to intensity of illness or inability to take PO and Inpatient level of care appropriate due to severity of illness  Dispo: The patient is from: Home              Anticipated d/c is to: Home              Anticipated d/c date is: 1 day              Patient currently is not medically stable to d/c.   Difficult to place patient No   Consultants:   Heartcare/cardiology  Procedures:   n/a  Antimicrobials:  n/a  Subjective: - Voiding well Says dyspnea on exertion is better -no cough  Objective: Vitals:   04/18/20 1332 04/18/20 2055 04/19/20 0500 04/19/20 0615  BP: 126/60 (!) 115/49  (!) 155/74  Pulse: (!) 54 (!) 58  63  Resp: 18 18  20   Temp: (!) 97.4 F (36.3 C) 97.9 F (36.6 C)  97.9 F (36.6 C)  TempSrc: Oral Oral  Oral  SpO2: 97% 95%  98%  Weight:   89.1 kg   Height:        Intake/Output Summary (Last 24 hours) at 04/19/2020 1324 Last data filed at 04/19/2020 0252 Gross per 24 hour  Intake --  Output 1750 ml  Net -1750 ml   Filed Weights   04/17/20 0544 04/18/20 0500 04/19/20 0500  Weight: 87.1 kg 89.1 kg 89.1 kg    Examination:  Physical Exam  Gen:- Awake Alert, no conversational dyspnea HEENT:- [place].AT, No sclera icterus Neck-Supple Neck, +ve JVD,.   Lungs-improving air movement, no wheeze  CV- S1, S2 normal, irregularly irregular Abd-  +ve B.Sounds, Abd Soft, No tenderness,    Extremity/Skin:- +ve  edema,   good pedal pulses Psych-affect is appropriate, oriented x3 Neuro-generalized weakness, no new focal deficits, no tremors   Data Reviewed: I have personally reviewed following labs and imaging studies  CBC: Recent Labs  Lab 04/14/20 1845 04/15/20 0530 04/17/20 0445  WBC 4.5 4.5 4.8  NEUTROABS 3.6 3.4  --   HGB 9.8* 9.8* 9.4*  HCT 31.9* 30.9* 31.5*  MCV 88.4 86.8 89.2  PLT 183 186 935    Basic Metabolic Panel: Recent Labs  Lab 04/15/20 0530 04/16/20 0447 04/17/20 0445 04/18/20 0528 04/19/20 0525  NA 136 141 138 138 139  K 3.0* 4.1 4.2 4.2 5.2*  CL 95* 98 98 99 98  CO2 31 30 29 31 30   GLUCOSE  129* 70 161* 172* 168*  BUN 42* 41* 36* 32* 32*  CREATININE 1.47* 1.62* 1.61* 1.74* 1.70*  CALCIUM 8.8* 9.2 9.0 8.8* 9.2  MG 1.7 1.7 1.8 1.8 1.7    GFR: Estimated Creatinine Clearance: 41.8 mL/min (A) (by C-G formula based on SCr of 1.7 mg/dL (H)).  Liver Function Tests: Recent Labs  Lab 04/14/20 1845 04/15/20 0530  AST 27 20  ALT 41 35  ALKPHOS 213* 194*  BILITOT 0.5 0.6  PROT 6.4* 5.9*  ALBUMIN 2.8* 2.6*    CBG: Recent Labs  Lab 04/18/20 1114 04/18/20 1620 04/18/20 2131 04/19/20 0745 04/19/20 1150  GLUCAP 124* 125* 85 188* 170*    Recent Results (from the past 240 hour(s))  Resp Panel by RT-PCR (Flu A&B, Covid) Nasopharyngeal Swab     Status: None   Collection Time: 04/14/20  6:51 PM   Specimen: Nasopharyngeal Swab; Nasopharyngeal(NP) swabs in vial transport medium  Result Value Ref Range Status   SARS Coronavirus 2 by RT PCR NEGATIVE NEGATIVE Final    Comment: (NOTE) SARS-CoV-2 target nucleic acids are NOT DETECTED.  The SARS-CoV-2 RNA is generally detectable in upper respiratory specimens during the acute phase of infection. The lowest concentration of SARS-CoV-2 viral copies this assay  can detect is 138 copies/mL. A negative result does not preclude SARS-Cov-2 infection and should not be used as the sole basis for treatment or other patient management decisions. A negative result may occur with  improper specimen collection/handling, submission of specimen other than nasopharyngeal swab, presence of viral mutation(s) within the areas targeted by this assay, and inadequate number of viral copies(<138 copies/mL). A negative result must be combined with clinical observations, patient history, and epidemiological information. The expected result is Negative.  Fact Sheet for Patients:  EntrepreneurPulse.com.au  Fact Sheet for Healthcare Providers:  IncredibleEmployment.be  This test is no t yet approved or cleared by the [place] FDA and  has been authorized for detection and/or diagnosis of SARS-CoV-2 by FDA under an Emergency Use Authorization (EUA). This EUA will remain  in effect (meaning this test can be used) for the duration of the COVID-19 declaration under Section 564(b)(1) of the Act, 21 U.S.C.section 360bbb-3(b)(1), unless the authorization is terminated  or revoked sooner.       Influenza A by PCR NEGATIVE NEGATIVE Final   Influenza B by PCR NEGATIVE NEGATIVE Final    Comment: (NOTE) The Xpert Xpress SARS-CoV-2/FLU/RSV plus assay is intended as an aid in the diagnosis of influenza from Nasopharyngeal swab specimens and should not be used as a sole basis for treatment. Nasal washings and aspirates are unacceptable for Xpert Xpress SARS-CoV-2/FLU/RSV testing.  Fact Sheet for Patients: EntrepreneurPulse.com.au  Fact Sheet for Healthcare Providers: IncredibleEmployment.be  This test is not yet approved or cleared by the [place] FDA and has been authorized for detection and/or diagnosis of SARS-CoV-2 by FDA under an Emergency Use Authorization (EUA). This EUA will  remain in effect (meaning this test can be used) for the duration of the COVID-19 declaration under Section 564(b)(1) of the Act, 21 U.S.C. section 360bbb-3(b)(1), unless the authorization is terminated or revoked.  Performed at [facility], [address], [place], [place] [address]   Culture, Urine     Status: None   Collection Time: 04/14/20 10:47 PM   Specimen: Urine, Clean Catch  Result Value Ref Range Status   Specimen Description   Final    URINE, CLEAN CATCH Performed at [facility], [address], [place], [place]  [address]    Special Requests   Final    NONE Performed at [facility], [address], [place], [place] [address]    Culture   Final    NO GROWTH Performed at [facility], [place] [address] [place], [place] [address]    Report Status 04/16/2020 FINAL  Final     Radiology Studies: No results found. Scheduled Meds: . amiodarone  200 mg Oral Daily  . aspirin EC  81 mg Oral Daily  . atorvastatin  10 mg Oral Daily  . carvedilol  12.5 mg Oral BID WC  . empagliflozin  10 mg Oral Daily  . heparin  5,000 Units Subcutaneous Q8H  . insulin aspart  0-15 Units Subcutaneous TID WC  . insulin aspart  0-5 Units Subcutaneous QHS  . insulin aspart  5 Units Subcutaneous TID WC  . isosorbide-hydrALAZINE  1 tablet Oral TID  . nicotine  14 mg Transdermal Daily  . potassium chloride  40 mEq Oral BID  . silver sulfADIAZINE   Topical BID  . sodium zirconium cyclosilicate  10 g Oral Once  . torsemide  40 mg Oral BID   Continuous Infusions: . magnesium sulfate bolus IVPB       LOS: 5 days   Roxan Hockey, MD How to contact the [facility] Attending or Consulting provider [place] or covering provider during after hours [place], for this patient?  1. Check the care team in [facility] and look for a) attending/consulting [facility] provider listed and b) the [facility] team listed 2. Log into [email or web address] and use [place]'s universal password to access. If you do not have the password,  please contact the hospital operator. 3. Locate the [facility] provider you are looking for under [facility] and page to a number that you can be directly reached. 4. If you still have difficulty reaching the provider, please page the [facility] (Director on Call) for the Hospitalists listed on amion for assistance.  04/19/2020, 1:24 PM

## 2020-04-20 DIAGNOSIS — I5023 Acute on chronic systolic (congestive) heart failure: Secondary | ICD-10-CM | POA: Diagnosis not present

## 2020-04-20 LAB — BASIC METABOLIC PANEL
Anion gap: 10 (ref 5–15)
BUN: 29 mg/dL — ABNORMAL HIGH (ref 8–23)
CO2: 29 mmol/L (ref 22–32)
Calcium: 8.9 mg/dL (ref 8.9–10.3)
Chloride: 93 mmol/L — ABNORMAL LOW (ref 98–111)
Creatinine, Ser: 1.81 mg/dL — ABNORMAL HIGH (ref 0.61–1.24)
GFR, Estimated: 39 mL/min — ABNORMAL LOW (ref >60)
Glucose, Bld: 132 mg/dL — ABNORMAL HIGH (ref 70–99)
Potassium: 5 mmol/L (ref 3.5–5.1)
Sodium: 132 mmol/L — ABNORMAL LOW (ref 135–145)

## 2020-04-20 LAB — MAGNESIUM: Magnesium: 1.9 mg/dL (ref 1.7–2.4)

## 2020-04-20 LAB — GLUCOSE, CAPILLARY
Glucose-Capillary: 130 mg/dL — ABNORMAL HIGH (ref 70–99)
Glucose-Capillary: 134 mg/dL — ABNORMAL HIGH (ref 70–99)

## 2020-04-20 MED ORDER — CARVEDILOL 12.5 MG PO TABS: 12.5000 mg | ORAL_TABLET | Freq: Two times a day (BID) | ORAL | 3 refills | Status: DC

## 2020-04-20 MED ORDER — ISOSORB DINITRATE-HYDRALAZINE 20-37.5 MG PO TABS: 1.0000 | ORAL_TABLET | Freq: Three times a day (TID) | ORAL | 3 refills | Status: DC

## 2020-04-20 MED ORDER — NICOTINE 14 MG/24HR TD PT24: 14.0000 mg | MEDICATED_PATCH | Freq: Every day | TRANSDERMAL | 0 refills | Status: DC

## 2020-04-20 MED ORDER — TORSEMIDE 40 MG PO TABS: 40.0000 mg | ORAL_TABLET | ORAL | 3 refills | Status: DC

## 2020-04-20 MED ORDER — POTASSIUM CHLORIDE ER 20 MEQ PO TBCR: 20.0000 meq | EXTENDED_RELEASE_TABLET | Freq: Every day | ORAL | 1 refills | Status: DC

## 2020-04-20 MED ORDER — EMPAGLIFLOZIN 10 MG PO TABS: 10.0000 mg | ORAL_TABLET | Freq: Every day | ORAL | 3 refills | Status: DC

## 2020-04-20 MED ORDER — ASPIRIN EC 81 MG PO TBEC: 81.0000 mg | DELAYED_RELEASE_TABLET | Freq: Every day | ORAL | 3 refills | Status: DC

## 2020-04-20 NOTE — Progress Notes (Signed)
Nsg Discharge Note  Admit Date:  04/14/2020 Discharge date: 04/20/2020   Richard [name] to be D/C'd Home per MD order.  AVS completed.  Copy for chart, and copy for patient signed, and dated. Patient/caregiver able to verbalize understanding. IV removed. Discharge paperwork given and reviewed with patient and patients wife. Patient stable upon discharge.   Discharge Medication: Allergies as of 04/20/2020      Reactions   Statins Other (See Comments)   Leg pain, tolerates lipitor       Medication List    STOP taking these medications   isosorbide mononitrate 30 MG 24 hr tablet Commonly known as: IMDUR   metoprolol succinate 100 MG 24 hr tablet Commonly known as: TOPROL-XL     TAKE these medications   acetaminophen 325 MG tablet Commonly known as: TYLENOL Take 2 tablets (650 mg total) by mouth every 6 (six) hours as needed for mild pain (or Fever >/= 101).   ALPRAZolam 0.5 MG tablet Commonly known as: XANAX Take 0.5 mg by mouth 2 (two) times daily as needed for sleep.   amiodarone 200 MG tablet Commonly known as: PACERONE Take 1 tablet (200 mg total) by mouth daily.   aspirin EC 81 MG tablet Take 1 tablet (81 mg total) by mouth daily with breakfast. Swallow whole. What changed: when to take this   atorvastatin 10 MG tablet Commonly known as: LIPITOR Take 10 mg by mouth daily.   B-D ULTRAFINE III SHORT PEN 31G X 8 MM Misc Generic drug: Insulin Pen Needle Inject into the skin.   carvedilol 12.5 MG tablet Commonly known as: COREG Take 1 tablet (12.5 mg total) by mouth 2 (two) times daily with a meal.   empagliflozin 10 MG Tabs tablet Commonly known as: JARDIANCE Take 1 tablet (10 mg total) by mouth daily. Start taking on: April 21, 2020   glipiZIDE 10 MG 24 hr tablet Commonly known as: GLUCOTROL XL Take 10 mg by mouth 2 (two) times daily.   hydrALAZINE 25 MG tablet Commonly known as: APRESOLINE Take 1 tablet (25 mg total) by mouth 2 (two) times daily.    isosorbide-hydrALAZINE 20-37.5 MG tablet Commonly known as: BIDIL Take 1 tablet by mouth 3 (three) times daily.   nicotine 14 mg/24hr patch Commonly known as: NICODERM CQ - dosed in mg/24 hours Place 1 patch (14 mg total) onto the skin daily. Start taking on: April 21, 2020   nitroGLYCERIN 0.4 MG SL tablet Commonly known as: NITROSTAT Place 1 tablet (0.4 mg total) under the tongue every 5 (five) minutes as needed for chest pain.   Oxycodone HCl 10 MG Tabs Take 1 tablet (10 mg total) by mouth 4 (four) times daily as needed. What changed: reasons to take this   Potassium Chloride ER 20 MEQ Tbcr Take 20 mEq by mouth daily. -While taking torsemide   Torsemide 40 MG Tabs Take 40 mg by mouth See admin instructions. Take 40 mg every morning and 20 mg every evening What changed:   medication strength  when to take this  additional instructions       Discharge Assessment: Vitals:   04/20/20 0849 04/20/20 1316  BP: (!) 166/82 (!) 127/57  Pulse: 67 64  Resp:  19  Temp:  98.3 F (36.8 C)  SpO2: 97% 94%   Skin clean, dry and intact without evidence of skin break down, no evidence of skin tears noted. IV catheter discontinued intact. Site without signs and symptoms of complications - no redness or  edema noted at insertion site, patient denies c/o pain - only slight tenderness at site.  Dressing with slight pressure applied.  D/c Instructions-Education: Discharge instructions given to patient/family with verbalized understanding. D/c education completed with patient/family including follow up instructions, medication list, d/c activities limitations if indicated, with other d/c instructions as indicated by MD - patient able to verbalize understanding, all questions fully answered. Patient instructed to return to ED, call 911, or call MD for any changes in condition.  Patient escorted via [place], and D/C home via private auto.  Zenaida Deed, RN 04/20/2020 2:12 PM

## 2020-04-20 NOTE — Discharge Instructions (Signed)
1)Very low-salt diet advised 2)Weigh yourself daily, call if you gain more than 3 pounds in 1 day or more than 5 pounds in 1 week as your diuretic medications may need to be adjusted 3)Limit your Fluid  intake to no more than 50 ounces (1.5 Liters) per day 4)Avoid ibuprofen/Advil/Aleve/Motrin/Goody Powders/Naproxen/BC powders/Meloxicam/Diclofenac/Indomethacin and other Nonsteroidal anti-inflammatory medications as these will make you more likely to bleed and can cause stomach ulcers, can also cause Kidney problems.  5) repeat CBC and repeat BMP blood test with [name] on Tuesday, 04/22/2020 6) please note that there has been several changes to your medications--- metoprolol/Toprol has been discontinued and carvedilol/Coreg will be started in place of metoprolol -Isosorbide as been discontinued on BiDil which is a combination of hydralazine and isosorbide as been started instead -Torsemide/Demadex has been adjusted to 40 mg in the morning and 20 mg in the evening -Jardiance/ empagliflozin--has been added to your diabetic regimen as it also helps your heart  7) smoking cessation advised--- okay to use nicotine patch to help you quit smoking  8) please talk to [name] about referral for possible watchman device which can reduce your risk for stroke in the setting of atrial fibrillation --Please talk to [name] about possibly restarting Delene Loll which can help your heart failure

## 2020-04-20 NOTE — Discharge Summary (Signed)
Richard [name], is a 75 y.o. male  DOB February 16, 1946  MRN [id number].  Admission date:  04/14/2020  Admitting Physician  Rolla Plate, DO  Discharge Date:  04/20/2020   Primary MD  Wannetta Sender, FNP  Recommendations for primary care physician for things to follow:   1)Very low-salt diet advised 2)Weigh yourself daily, call if you gain more than 3 pounds in 1 day or more than 5 pounds in 1 week as your diuretic medications may need to be adjusted 3)Limit your Fluid  intake to no more than 50 ounces (1.5 Liters) per day 4)Avoid ibuprofen/Advil/Aleve/Motrin/Goody Powders/Naproxen/BC powders/Meloxicam/Diclofenac/Indomethacin and other Nonsteroidal anti-inflammatory medications as these will make you more likely to bleed and can cause stomach ulcers, can also cause Kidney problems.  5) repeat CBC and repeat BMP blood test with [name] on Tuesday, 04/22/2020 6) please note that there has been several changes to your medications--- metoprolol/Toprol has been discontinued and carvedilol/Coreg will be started in place of metoprolol -Isosorbide as been discontinued on BiDil which is a combination of hydralazine and isosorbide as been started instead -Torsemide/Demadex has been adjusted to 40 mg in the morning and 20 mg in the evening -Jardiance/ empagliflozin--has been added to your diabetic regimen as it also helps your heart  7) smoking cessation advised--- okay to use nicotine patch to help you quit smoking  8) please talk to [name] about referral for possible watchman device which can reduce your risk for stroke in the setting of atrial fibrillation --Please talk to 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 2007 - DES x 2 proximal to mid RCA b. 09/2017: DES to mid LAD and OM2. Patent stents along RCA.    CHF (congestive heart failure) (HCC)    Essential hypertension    History of atrial flutter    Status post ablation 2008 - Dr. Caryl Comes   History of transient ischemic attack (TIA)    Mixed hyperlipidemia    Statin intolerance   Pneumonia    Type 2 diabetes mellitus ([place])    A1C 11.5 06/2017    Past Surgical History:  Procedure Laterality Date   BALLOON ANGIOPLASTY, ARTERY     BICEPS TENDON REPAIR Left    CARDIAC ELECTROPHYSIOLOGY MAPPING AND ABLATION     CARPAL  TUNNEL RELEASE Bilateral    cervical neck fusion     x 4   CORONARY ANGIOPLASTY     CORONARY STENT INTERVENTION N/A 10/17/2017   Procedure: CORONARY STENT INTERVENTION;  Surgeon: Martinique, Peter M, MD;  Location: [facility];  Service: Cardiovascular;  Laterality: N/A;   CORONARY STENT PLACEMENT     x 2   CYSTOSCOPY W/ RETROGRADES Bilateral 10/08/2019   Procedure: CYSTOSCOPY WITH BILATERAL RETROGRADE PYELOGRAM;  Surgeon: Cleon Gustin, MD;  Location: [facility];  Service: Urology;  Laterality: Bilateral;   CYSTOSCOPY W/ URETERAL STENT PLACEMENT Bilateral 01/26/2020   Procedure: CYSTOSCOPY WITH RETROGRADE PYELOGRAM bilateral right ureter stent removal;  Surgeon: Irine Seal, MD;  Location: [facility];  Service: Urology;  Laterality: Bilateral;   CYSTOSCOPY WITH FULGERATION N/A 01/26/2020    Procedure: CYSTOSCOPY WITH FULGERATION, CLOT EVACUATION transurethral resection bladder tumor;  Surgeon: Irine Seal, MD;  Location: [facility];  Service: Urology;  Laterality: N/A;   CYSTOSCOPY WITH URETHRAL DILATATION  10/08/2019   Procedure: CYSTOSCOPY WITH URETHRAL DILATATION;  Surgeon: Cleon Gustin, MD;  Location: [facility];  Service: Urology;;   LEFT HEART CATH AND CORONARY ANGIOGRAPHY N/A 10/17/2017   Procedure: LEFT HEART CATH AND CORONARY ANGIOGRAPHY;  Surgeon: Martinique, Peter M, MD;  Location: [facility];  Service: Cardiovascular;  Laterality: N/A;   ROTATOR CUFF REPAIR Right    x 4   TIBIA FRACTURE SURGERY Left    TRANSURETHRAL RESECTION OF BLADDER TUMOR N/A 10/08/2019   Procedure: TRANSURETHRAL RESECTION OF BLADDER TUMOR (TURBT);  Surgeon: Cleon Gustin, MD;  Location: [facility];  Service: Urology;  Laterality: N/A;       HPI  from the history and physical done on the day of admission:      Richard [name]  is a 75 y.o. 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  Patient reports that he started retaining fluid 2 weeks ago.  His dry weight is 180 pounds and currently he is 194 pounds per his report.  Patient reports that its been a slowly progressing retention of fluid until this weekend when he got acutely worse.  He did see the cardiologist on Friday and increased torsemide to 40 mg daily.  Patient was advised to come into the ER that day, but did not because he wanted to finish his last radiation treatment at [facility] today.  Patient reports that the increase in torsemide has had no effect.  He reports exertional dyspnea and orthopnea.  He has been sleeping in the recliner chair at night.  He reports that his legs are heavy, especially on the left side.  He denies any chest pain, palpitations.  Reports he is thirsty all the time and drinking constantly.  Wife at bedside  reports that he has 12-14 100 urine output daily and that has been the same over the weekend.  Patient reports scrotal swelling as well that has caused breakdown of the skin in the posterior scrotum.  He reports that that is painful.  Patient has no other complaints at this time.  Patient is a current smoker and reports that he smokes less than a pack per day.  He did request nicotine patch.  He is not vaccinated for Covid.  He is full code.  In the ED Temp 98.2, heart rate 50, respiratory rate 15, blood pressure 164/75, satting at 97%  on room air White blood cell count 4.5, hemoglobin 9.8 Chemistry panel is mostly unremarkable with a creatinine of 1.73 which is at his baseline and hyperglycemia 183 BNP is elevated at 1772 UA is borderline urine culture sent Chest x-ray shows mild central vascular congestion with trace pleural effusion Ultrasound lower extremities bilateral showed no DVT EKG shows a heart rate of 52, junctional rhythm, QTC 474 Lasix 20 mg IV was given in the ED -patient did take his 40 mg of torsemide this a.m. Admission requested for acute CHF exacerbation     Hospital Course:     Brief Admission History:  75 y.o.male,with history of T2DM, HLD, TIA, Combined CHF with EF 30-35%, CAD, bladder cancer and more presents to the ED with a chief complaint of edema.Patient reports that he started retaining fluid 2 weeks ago. His dry weight is 180 pounds and currently he is 194 pounds per his report. Patient reports that its been a slowly progressing retention of fluid until this weekend when he got acutely worse. He did see the cardiologist on Friday and increased torsemide to 40 mg daily. Patient was advised to come into the ER that day, but did not because he wanted to finish his last radiation treatment at [facility] today. Patient reports that the increase in torsemide has had no effect. He reports exertional dyspnea and orthopnea. He has been sleeping in the recliner  chair at night. He reports that his legs are heavy, especially on the left side. He denies any chest pain, palpitations. Reports he is thirsty all the time and drinking constantly. Wife at bedside reports that he has 12-14 100 urine output daily and that has been the same over the weekend. Patient reports scrotal swelling as well that has caused breakdown of the skin in the posterior scrotum. He reports that that is painful. Patient has no other complaints at this time.  Patient is a current smoker and reports that he smokes less than a pack per day. He did request nicotine patch. He is not vaccinated for Covid. He is full code.  Pt was admitted with acute systolic heart failure that has failed outpatient therapy and has been difficult to manage generally.    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  Atrial flutter (HCC) - s/p ablation  Paroxysmal Atrial Fibrillation (Hcc)  Coronary Artery Disease Involving Native Coronary Artery of Native Heart Without Angina Pectoris  Essential Hypertension  DM (Diabetes Mellitus) (Hcc)  Chf Exacerbation (Hcc) ([place])    1)Acute HFrEF -admitted with acute on chronic systolic dysfunction CHF/dilated cardiomyopathy --last known EF 30 to 35% based on echo from December 2021  --pt failed outpatient diuretic therapy  -Patient diuresed very well--at the time of discharge patient's weight was down to 180.9 pounds -Fluid balance remains negative -Isosorbide hydralazine combo per cardiology team -Patient is being switched from metoprolol to Coreg for better BP control and due to propensity towards bradycardia on metoprolol -Patient is intolerant to RAAS inhibition -Cardiology recommendations below noted --Discharge home on -Added Empagliflozin 10mg  daily -Stopped Metoprolol Succinate and started Carvedilol 12.5mg   BID -Continue hydralazine/Isosorbide -Plan to re-challenge with Entresto in outpatient follow up -Cardiology appointment already given for Tuesday, 04/23/2019   2)Essential Hypertension -med adjustments of medication as above #1   3)paroxysmal Atrial fibrillation/flutter - he remains on amiodarone , Toprol to be changed to Coreg as  above #1-  he is not anticoagulated due to history of bladder cancer with hematuria and anemia.  ????-- if candidate for watchman device  4)AKI on Stage 3B CKD - monitoring BMP daily while on diuretics.   Lab Results  Component Value Date   CREATININE 1.81 (H) 04/20/2020   CREATININE 1.70 (H) 04/19/2020   CREATININE 1.74 (H) 04/18/2020    5) CAD-- CAD s/p PCI to RCA in 2007, LAD and OM in 2019,  -Continue Lipitor, aspirin and Coreg -Remains chest pain-free  6)Bladder cancer - outpatient follow up after discharge for ongoing therapies. -Palliative care consult appreciated  7)type 2 diabetes mellitus -last A1c 9.6  --Added empagliflozin for cardiovascular benefits and for  better diabetic control  8)Tobacco abuser - -nicotine patch advised smoking cessation advised   9) chronic anemia--- suspect some degree of anemia of CKD, hemoglobin currently stable above nine  Code Status: Full  Family Communication: Discussed with patient's wife and patient's daughter Abigail Butts Disposition: Home   Dispo: The patient is from: Home  Anticipated d/c is to: Home     Consultants:   Heartcare/cardiology   Discharge Condition: Stable  Follow UP   Follow-up Information    Satira Sark, MD Follow up on 04/22/2020.   Specialty: Cardiology Contact information: [place] [address] [phone]               Consults obtained -   Diet and Activity recommendation:  As advised  Discharge Instructions    Discharge Instructions    AMB referral to CHF clinic   Complete by: As directed    Call MD  for:  difficulty breathing, headache or visual disturbances   Complete by: As directed    Call MD for:  persistant dizziness or light-headedness   Complete by: As directed    Call MD for:  persistant nausea and vomiting   Complete by: As directed    Call MD for:  temperature >100.4   Complete by: As directed    Diet - low sodium heart healthy   Complete by: As directed    Diet Carb Modified   Complete by: As directed    Discharge instructions   Complete by: As directed    1)Very low-salt diet advised 2)Weigh yourself daily, call if you gain more than 3 pounds in 1 day or more than 5 pounds in 1 week as your diuretic medications may need to be adjusted 3)Limit your Fluid  intake to no more than 50 ounces (1.5 Liters) per day 4)Avoid ibuprofen/Advil/Aleve/Motrin/Goody Powders/Naproxen/BC powders/Meloxicam/Diclofenac/Indomethacin and other Nonsteroidal anti-inflammatory medications as these will make you more likely to bleed and can cause stomach ulcers, can also cause Kidney problems.  5) repeat CBC and repeat BMP blood test with [name] on Tuesday, 04/22/2020 6) please note that there has been several changes to your medications--- metoprolol/Toprol has been discontinued and carvedilol/Coreg will be started in place of metoprolol -Isosorbide as been discontinued on BiDil which is a combination of hydralazine and isosorbide as been started instead -Torsemide/Demadex has been adjusted to 40 mg in the morning and 20 mg in the evening -Jardiance/ empagliflozin--has been added to your diabetic regimen as it also helps your heart  7) smoking cessation advised--- okay to use nicotine patch to help you quit smoking  8) please talk to [name] about referral for possible watchman device which can reduce your risk for stroke in the setting of atrial fibrillation --Please talk to [name] about possibly  restarting Entresto which can help your heart failure   Increase activity slowly    Complete by: As directed        Discharge Medications     Allergies as of 04/20/2020      Reactions   Statins Other (See Comments)   Leg pain, tolerates lipitor       Medication List    STOP taking these medications   isosorbide mononitrate 30 MG 24 hr tablet Commonly known as: IMDUR   metoprolol succinate 100 MG 24 hr tablet Commonly known as: TOPROL-XL     TAKE these medications   acetaminophen 325 MG tablet Commonly known as: TYLENOL Take 2 tablets (650 mg total) by mouth every 6 (six) hours as needed for mild pain (or Fever >/= 101).   ALPRAZolam 0.5 MG tablet Commonly known as: XANAX Take 0.5 mg by mouth 2 (two) times daily as needed for sleep.   amiodarone 200 MG tablet Commonly known as: PACERONE Take 1 tablet (200 mg total) by mouth daily.   aspirin EC 81 MG tablet Take 1 tablet (81 mg total) by mouth daily with breakfast. Swallow whole. What changed: when to take this   atorvastatin 10 MG tablet Commonly known as: LIPITOR Take 10 mg by mouth daily.   B-D ULTRAFINE III SHORT PEN 31G X 8 MM Misc Generic drug: Insulin Pen Needle Inject into the skin.   carvedilol 12.5 MG tablet Commonly known as: COREG Take 1 tablet (12.5 mg total) by mouth 2 (two) times daily with a meal.   empagliflozin 10 MG Tabs tablet Commonly known as: JARDIANCE Take 1 tablet (10 mg total) by mouth daily. Start taking on: April 21, 2020   glipiZIDE 10 MG 24 hr tablet Commonly known as: GLUCOTROL XL Take 10 mg by mouth 2 (two) times daily.   hydrALAZINE 25 MG tablet Commonly known as: APRESOLINE Take 1 tablet (25 mg total) by mouth 2 (two) times daily.   isosorbide-hydrALAZINE 20-37.5 MG tablet Commonly known as: BIDIL Take 1 tablet by mouth 3 (three) times daily.   nicotine 14 mg/24hr patch Commonly known as: NICODERM CQ - dosed in mg/24 hours Place 1 patch (14 mg total) onto the skin daily. Start taking on: April 21, 2020   nitroGLYCERIN 0.4 MG SL  tablet Commonly known as: NITROSTAT Place 1 tablet (0.4 mg total) under the tongue every 5 (five) minutes as needed for chest pain.   Oxycodone HCl 10 MG Tabs Take 1 tablet (10 mg total) by mouth 4 (four) times daily as needed. What changed: reasons to take this   Potassium Chloride ER 20 MEQ Tbcr Take 20 mEq by mouth daily. -While taking torsemide   Torsemide 40 MG Tabs Take 40 mg by mouth See admin instructions. Take 40 mg every morning and 20 mg every evening What changed:   medication strength  when to take this  additional instructions       Major procedures and Radiology Reports - PLEASE review detailed and final reports for all details, in brief -    MR Lumbar Spine W Wo Contrast  Result Date: 03/27/2020 CLINICAL DATA:  Malignant neoplasm of bladder.  Low back pain EXAM: MRI LUMBAR SPINE WITHOUT AND WITH CONTRAST TECHNIQUE: Multiplanar and multiecho pulse sequences of the lumbar spine were obtained without and with intravenous contrast. CONTRAST:  55mL GADAVIST GADOBUTROL 1 MMOL/ML IV SOLN COMPARISON:  MRI lumbar spine 11/23/2007. CT abdomen pelvis 01/25/2020 FINDINGS: Segmentation:  Normal Alignment:  Normal Vertebrae: Large lesion left  posterior iliac bone measuring approximately 6.0 x 5.3 cm. This shows edema and enhancement and is compatible with metastatic disease. There is a small 1 cm lesion in this area on the recent CT with dramatic growth in this lesion. No other bone lesions identified. Negative for fracture. Conus medullaris and cauda equina: Conus extends to the L1-2 level. Conus and cauda equina appear normal. Paraspinal and other soft tissues: Negative for retroperitoneal mass or adenopathy. Disc levels: L1-2: Mild disc degeneration.  Negative for stenosis. L2-3: Mild disc degeneration.  Negative for stenosis L3-4: Mild disc degeneration.  Negative for stenosis. L4-5: Disc bulging and facet degeneration. Mild spinal stenosis and mild subarticular stenosis bilaterally.  L5-S1: Shallow central disc protrusion and mild facet degeneration. Negative for stenosis. IMPRESSION: Large mass lesion left posterior iliac bone compatible metastatic disease with marked interval growth since 01/25/2020. No other metastatic disease identified. Mild lumbar degenerative changes above. Electronically Signed   By: Franchot Gallo M.D.   On: 03/27/2020 21:48   US Venous Img Lower Bilateral  Result Date: 04/14/2020 CLINICAL DATA:  Lower extremity edema and pain EXAM: BILATERAL LOWER EXTREMITY VENOUS DUPLEX ULTRASOUND TECHNIQUE: Gray-scale sonography with graded compression, as well as color Doppler and duplex ultrasound were performed to evaluate the lower extremity deep venous systems from the level of the common femoral vein and including the common femoral, femoral, profunda femoral, popliteal and calf veins including the posterior tibial, peroneal and gastrocnemius veins when visible. The superficial great saphenous vein was also interrogated. Spectral Doppler was utilized to evaluate flow at rest and with distal augmentation maneuvers in the common femoral, femoral and popliteal veins. COMPARISON:  None. FINDINGS: RIGHT LOWER EXTREMITY Common Femoral Vein: No evidence of thrombus. Normal compressibility, respiratory phasicity and response to augmentation. Saphenofemoral Junction: No evidence of thrombus. Normal compressibility and flow on color Doppler imaging. Profunda Femoral Vein: No evidence of thrombus. Normal compressibility and flow on color Doppler imaging. Femoral Vein: No evidence of thrombus. Normal compressibility, respiratory phasicity and response to augmentation. Popliteal Vein: No evidence of thrombus. Normal compressibility, respiratory phasicity and response to augmentation. Calf Veins: No evidence of thrombus. Normal compressibility and flow on color Doppler imaging. Superficial Great Saphenous Vein: No evidence of thrombus. Normal compressibility. Venous Reflux:  None. Other  Findings:  None. LEFT LOWER EXTREMITY Common Femoral Vein: No evidence of thrombus. Normal compressibility, respiratory phasicity and response to augmentation. Saphenofemoral Junction: No evidence of thrombus. Normal compressibility and flow on color Doppler imaging. Profunda Femoral Vein: No evidence of thrombus. Normal compressibility and flow on color Doppler imaging. Femoral Vein: No evidence of thrombus. Normal compressibility, respiratory phasicity and response to augmentation. Popliteal Vein: No evidence of thrombus. Normal compressibility, respiratory phasicity and response to augmentation. Calf Veins: No evidence of thrombus. Normal compressibility and flow on color Doppler imaging. Superficial Great Saphenous Vein: No evidence of thrombus. Normal compressibility. Venous Reflux:  None. Other Findings:  Lower extremity edema noted bilaterally. IMPRESSION: No evidence of deep venous thrombosis in either lower extremity. Lower extremity soft tissue edema noted bilaterally. Electronically Signed   By: Lowella Grip III M.D.   On: 04/14/2020 19:15   DG Chest Port 1 View  Result Date: 04/14/2020 CLINICAL DATA:  Shortness of breath EXAM: PORTABLE CHEST 1 VIEW COMPARISON:  03/21/2020 FINDINGS: Trace pleural effusions. Stable cardiomediastinal silhouette with aortic atherosclerosis. Mild central vascular congestion with overall decreased edema. No consolidation or pneumothorax. IMPRESSION: Mild central vascular congestion with trace pleural effusions. Electronically Signed   By: Donavan Foil  M.D.   On: 04/14/2020 18:23   Micro Results   Recent Results (from the past 240 hour(s))  Resp Panel by RT-PCR (Flu A&B, Covid) Nasopharyngeal Swab     Status: None   Collection Time: 04/14/20  6:51 PM   Specimen: Nasopharyngeal Swab; Nasopharyngeal(NP) swabs in vial transport medium  Result Value Ref Range Status   SARS Coronavirus 2 by RT PCR NEGATIVE NEGATIVE Final    Comment: (NOTE) SARS-CoV-2 target  nucleic acids are NOT DETECTED.  The SARS-CoV-2 RNA is generally detectable in upper respiratory specimens during the acute phase of infection. The lowest concentration of SARS-CoV-2 viral copies this assay can detect is 138 copies/mL. A negative result does not preclude SARS-Cov-2 infection and should not be used as the sole basis for treatment or other patient management decisions. A negative result may occur with  improper specimen collection/handling, submission of specimen other than nasopharyngeal swab, presence of viral mutation(s) within the areas targeted by this assay, and inadequate number of viral copies(<138 copies/mL). A negative result must be combined with clinical observations, patient history, and epidemiological information. The expected result is Negative.  Fact Sheet for Patients:  EntrepreneurPulse.com.au  Fact Sheet for Healthcare Providers:  IncredibleEmployment.be  This test is no t yet approved or cleared by the [place] FDA and  has been authorized for detection and/or diagnosis of SARS-CoV-2 by FDA under an Emergency Use Authorization (EUA). This EUA will remain  in effect (meaning this test can be used) for the duration of the COVID-19 declaration under Section 564(b)(1) of the Act, 21 U.S.C.section 360bbb-3(b)(1), unless the authorization is terminated  or revoked sooner.       Influenza A by PCR NEGATIVE NEGATIVE Final   Influenza B by PCR NEGATIVE NEGATIVE Final    Comment: (NOTE) The Xpert Xpress SARS-CoV-2/FLU/RSV plus assay is intended as an aid in the diagnosis of influenza from Nasopharyngeal swab specimens and should not be used as a sole basis for treatment. Nasal washings and aspirates are unacceptable for Xpert Xpress SARS-CoV-2/FLU/RSV testing.  Fact Sheet for Patients: EntrepreneurPulse.com.au  Fact Sheet for Healthcare  Providers: IncredibleEmployment.be  This test is not yet approved or cleared by the [place] FDA and has been authorized for detection and/or diagnosis of SARS-CoV-2 by FDA under an Emergency Use Authorization (EUA). This EUA will remain in effect (meaning this test can be used) for the duration of the COVID-19 declaration under Section 564(b)(1) of the Act, 21 U.S.C. section 360bbb-3(b)(1), unless the authorization is terminated or revoked.  Performed at [facility], [address], [place], [place] [address]   Culture, Urine     Status: None   Collection Time: 04/14/20 10:47 PM   Specimen: Urine, Clean Catch  Result Value Ref Range Status   Specimen Description   Final    URINE, CLEAN CATCH Performed at [facility], [address] [place], [place] [address]    Special Requests   Final    NONE Performed at [facility], [address] [place], [place] [address]    Culture   Final    NO GROWTH Performed at [facility], [place] [address] [place], [place] [address]    Report Status 04/16/2020 FINAL  Final   Today   Subjective    Lashaun Krapf today has no new complaints No fever  Or chills   No Nausea, Vomiting or Diarrhea -Patient's daughter at bedside, patient ambulated over 200 feet without dyspnea on exertion or chest pains, post ambulation O2  sats 96 to 97% on room air  Patient has been seen and examined prior to discharge   Objective   Blood pressure (!) 166/82, pulse 67, temperature 98.4 F (36.9 C), temperature source Oral, resp. rate 18, height 6' (1.829 m), weight 82.1 kg, SpO2 97 %.   Intake/Output Summary (Last 24 hours) at 04/20/2020 1149 Last data filed at 04/20/2020 0901 Gross per 24 hour  Intake 740 ml  Output 2600 ml  Net -1860 ml   Exam Gen:- Awake Alert, no acute distress  HEENT:- [place].AT, No sclera icterus Neck-Supple Neck,No JVD,.  Lungs-much improved air movement, no wheezing  CV- S1, S2 normal, irregular Abd-   +ve B.Sounds, Abd Soft, No tenderness,    Extremity/Skin:-Improved edema,   good pulses Psych-affect is appropriate, oriented x3 Neuro-no new focal deficits, no tremors    Data Review   CBC w Diff:  Lab Results  Component Value Date   WBC 4.8 04/17/2020   HGB 9.4 (L) 04/17/2020   HCT 31.5 (L) 04/17/2020   PLT 175 04/17/2020   LYMPHOPCT 6 04/15/2020   MONOPCT 13 04/15/2020   EOSPCT 2 04/15/2020   BASOPCT 1 04/15/2020    CMP:  Lab Results  Component Value Date   NA 132 (L) 04/20/2020   K 5.0 04/20/2020   CL 93 (L) 04/20/2020   CO2 29 04/20/2020   BUN 29 (H) 04/20/2020   CREATININE 1.81 (H) 04/20/2020   PROT 5.9 (L) 04/15/2020   ALBUMIN 2.6 (L) 04/15/2020   BILITOT 0.6 04/15/2020   ALKPHOS 194 (H) 04/15/2020   AST 20 04/15/2020   ALT 35 04/15/2020  .   Total Discharge time is about 33 minutes  Roxan Hockey M.D on 04/20/2020 at 11:49 AM  Go to [email or web address] -  for contact info  [facility] - Office  [phone]

## 2020-04-21 NOTE — Progress Notes (Signed)
  Radiation Oncology         [phone] ________________________________  Name: Richard [name] MRN: [id number]  Date: 04/14/2020  DOB: 16-Jan-1946  End of Treatment Note  Diagnosis:   75 y.o. 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     Indication for treatment:  Curative, Definitive Radiotherapy       Radiation treatment dates:   01/28/20-04/14/20  Site/dose:  1. The bladder tumor only was initially boosted to 19.8 Gy with 11 additional fractions of 1.8 Gy with a full bladder set-up  2. The bladder and pelvic lymph nodes were subsequently treated to 45 Gy in 25 fractions of 1.8 Gy with empty bladder carrying the total tumor dose to 64.8 Gy  Beams/energy:  1. The bladder tumor only was boosted using 3D 15 megavolt photons. Image guidance was performed with CB-CT studies prior to each fraction. The patient was immobilized with a body fix lower extremity mold. 2. The bladder and pelvic lymph nodes were subsequently treated using 3D radiotherapy delivering 15 megavolt photons. Image guidance was performed with CB-CT studies prior to each fraction. The patient was immobilized with a body fix lower extremity mold.   Narrative: The patient tolerated radiation treatment relatively well.   The patient experienced some minor urinary irritation and modest fatigue.  His course was notable for multiple treatment interruptions which were for cardiac issues.  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  Dr. Raliegh Ip plans to investigate this with imaging and possible biopsy if clinically indicated.  Plan: The patient has completed radiation treatment. He will return to radiation oncology clinic for routine followup in one month. I advised him to call or return sooner if he has any questions or concerns related to his recovery or  treatment. ________________________________  Sheral Apley. Tammi Klippel, M.D.

## 2020-04-22 ENCOUNTER — Other Ambulatory Visit: Payer: Self-pay

## 2020-04-22 ENCOUNTER — Encounter: Payer: Self-pay | Admitting: Physician Assistant

## 2020-04-22 ENCOUNTER — Ambulatory Visit (INDEPENDENT_AMBULATORY_CARE_PROVIDER_SITE_OTHER): Payer: Medicare Other | Admitting: Physician Assistant

## 2020-04-22 VITALS — BP 116/50 | HR 63 | Ht 72.0 in | Wt 184.3 lb

## 2020-04-22 DIAGNOSIS — I5022 Chronic systolic (congestive) heart failure: Secondary | ICD-10-CM

## 2020-04-22 DIAGNOSIS — I480 Paroxysmal atrial fibrillation: Secondary | ICD-10-CM

## 2020-04-22 DIAGNOSIS — N183 Chronic kidney disease, stage 3 unspecified: Secondary | ICD-10-CM

## 2020-04-22 DIAGNOSIS — I2510 Atherosclerotic heart disease of native coronary artery without angina pectoris: Secondary | ICD-10-CM

## 2020-04-22 DIAGNOSIS — N3289 Other specified disorders of bladder: Secondary | ICD-10-CM

## 2020-04-22 NOTE — Patient Instructions (Signed)
Medication Instructions:  Your physician recommends that you continue on your current medications as directed. Please refer to the Current Medication list given to you today.  *If you need a refill on your cardiac medications before your next appointment, please call your pharmacy*   Lab Work: BMET next Thursday  05/01/20)   If you have labs (blood work) drawn today and your tests are completely normal, you will receive your results only by: [name] MyChart Message (if you have MyChart) OR . A paper copy in the mail If you have any lab test that is abnormal or we need to change your treatment, we will call you to review the results.   Testing/Procedures: None today   Follow-Up: At [facility], you and your health needs are our priority.  As part of our continuing mission to provide you with exceptional heart care, we have created designated Provider Care Teams.  These Care Teams include your primary Cardiologist (physician) and Advanced Practice Providers (APPs -  Physician Assistants and Nurse Practitioners) who all work together to provide you with the care you need, when you need it.  We recommend signing up for the patient portal called "MyChart".  Sign up information is provided on this After Visit Summary.  MyChart is used to connect with patients for Virtual Visits (Telemedicine).  Patients are able to view lab/test results, encounter notes, upcoming appointments, etc.  Non-urgent messages can be sent to your provider as well.   To learn more about what you can do with MyChart, go to [email or web address].    Your next appointment:   2 week(s)  The format for your next appointment:   In Person  Provider:   Rozann Lesches, MD, Bernerd Pho, PA-C or Ermalinda Barrios, PA-C   Other Instructions None    Thank you for choosing [facility] !

## 2020-04-24 ENCOUNTER — Ambulatory Visit (HOSPITAL_COMMUNITY)
Admission: RE | Admit: 2020-04-24 | Discharge: 2020-04-24 | Disposition: A | Discharge status: Home / Self Care | Payer: Medicare Other | Source: Ambulatory Visit | Attending: Hematology | Admitting: Hematology

## 2020-04-24 ENCOUNTER — Other Ambulatory Visit: Payer: Self-pay

## 2020-04-24 DIAGNOSIS — D494 Neoplasm of unspecified behavior of bladder: Secondary | ICD-10-CM | POA: Diagnosis not present

## 2020-04-24 DIAGNOSIS — R918 Other nonspecific abnormal finding of lung field: Secondary | ICD-10-CM | POA: Diagnosis not present

## 2020-04-24 LAB — GLUCOSE, CAPILLARY: Glucose-Capillary: 128 mg/dL — ABNORMAL HIGH (ref 70–99)

## 2020-04-24 MED ORDER — FLUDEOXYGLUCOSE F - 18 (FDG) INJECTION
8.5000 | Freq: Once | INTRAVENOUS | Status: AC
  Administered 2020-04-24: 8.9 via INTRAVENOUS

## 2020-04-29 ENCOUNTER — Other Ambulatory Visit: Payer: Self-pay

## 2020-04-29 ENCOUNTER — Inpatient Hospital Stay (HOSPITAL_COMMUNITY): Payer: Medicare Other | Attending: Hematology | Admitting: Hematology

## 2020-04-29 VITALS — BP 121/52 | HR 57 | Temp 97.4°F | Resp 18 | Wt 159.6 lb

## 2020-04-29 DIAGNOSIS — Z7982 Long term (current) use of aspirin: Secondary | ICD-10-CM | POA: Diagnosis not present

## 2020-04-29 DIAGNOSIS — F17210 Nicotine dependence, cigarettes, uncomplicated: Secondary | ICD-10-CM | POA: Diagnosis not present

## 2020-04-29 DIAGNOSIS — C679 Malignant neoplasm of bladder, unspecified: Secondary | ICD-10-CM | POA: Insufficient documentation

## 2020-04-29 DIAGNOSIS — L89159 Pressure ulcer of sacral region, unspecified stage: Secondary | ICD-10-CM | POA: Diagnosis not present

## 2020-04-29 DIAGNOSIS — R918 Other nonspecific abnormal finding of lung field: Secondary | ICD-10-CM | POA: Insufficient documentation

## 2020-04-29 DIAGNOSIS — M545 Low back pain, unspecified: Secondary | ICD-10-CM | POA: Diagnosis not present

## 2020-04-29 DIAGNOSIS — E119 Type 2 diabetes mellitus without complications: Secondary | ICD-10-CM | POA: Insufficient documentation

## 2020-04-29 DIAGNOSIS — I509 Heart failure, unspecified: Secondary | ICD-10-CM | POA: Diagnosis not present

## 2020-04-29 DIAGNOSIS — I110 Hypertensive heart disease with heart failure: Secondary | ICD-10-CM | POA: Insufficient documentation

## 2020-04-29 DIAGNOSIS — Z801 Family history of malignant neoplasm of trachea, bronchus and lung: Secondary | ICD-10-CM | POA: Diagnosis not present

## 2020-04-29 DIAGNOSIS — C678 Malignant neoplasm of overlapping sites of bladder: Secondary | ICD-10-CM

## 2020-04-29 DIAGNOSIS — Z8249 Family history of ischemic heart disease and other diseases of the circulatory system: Secondary | ICD-10-CM | POA: Diagnosis not present

## 2020-04-29 DIAGNOSIS — Z833 Family history of diabetes mellitus: Secondary | ICD-10-CM | POA: Diagnosis not present

## 2020-04-29 DIAGNOSIS — Z7984 Long term (current) use of oral hypoglycemic drugs: Secondary | ICD-10-CM | POA: Insufficient documentation

## 2020-04-29 DIAGNOSIS — Z79899 Other long term (current) drug therapy: Secondary | ICD-10-CM | POA: Insufficient documentation

## 2020-04-29 DIAGNOSIS — F419 Anxiety disorder, unspecified: Secondary | ICD-10-CM | POA: Diagnosis not present

## 2020-04-29 MED ORDER — OXYCODONE HCL 10 MG PO TABS: 10.0000 mg | ORAL_TABLET | Freq: Four times a day (QID) | ORAL | 0 refills | Status: AC | PRN

## 2020-04-29 NOTE — Patient Instructions (Addendum)
[place] [facility] Discharge Instructions  You were seen today by Dr. Delton Coombes. He went over your recent results and scan. The immunotherapy that can be used is called Keytruda. If you want, you will be referred to Dr. Tammi Klippel for additional radiation to the bones. Apply a Duoderm patch on your pressure ulcer and keep off that pressure point to allow the wound to heal. Dr. Delton Coombes will see you back in 3 weeks for labs and follow up.   Thank you for choosing [place] [facility] to provide your oncology and hematology care.  To afford each patient quality time with our provider, please arrive at least 15 minutes before your scheduled appointment time.   If you have a lab appointment with the [facility] please come in thru the Main Entrance and check in at the main information desk  You need to re-schedule your appointment should you arrive 10 or more minutes late.  We strive to give you quality time with our providers, and arriving late affects you and other patients whose appointments are after yours.  Also, if you no show three or more times for appointments you may be dismissed from the clinic at the providers discretion.     Again, thank you for choosing [facility].  Our hope is that these requests will decrease the amount of time that you wait before being seen by our physicians.       _____________________________________________________________  Should you have questions after your visit to [facility], please contact our office at [phone] between the hours of 8:00 a.m. and 4:30 p.m.  Voicemails left after 4:00 p.m. will not be returned until the following business day.  For prescription refill requests, have your pharmacy contact our office and allow 72 hours.    Cancer Center Support Programs:   > Cancer Support Group  2nd Tuesday of the month 1pm-2pm, Journey Room

## 2020-04-29 NOTE — Progress Notes (Signed)
Richard [name], [place] [address]   CLINIC:  Medical Oncology/Hematology  PCP:  Wannetta Sender, [place] [address] [place] [address] [phone]   REASON FOR VISIT:  Follow-up for bladder cancer  PRIOR THERAPY:  1. TURBT on 10/08/2019. 2. TURBT on 01/26/2020. 3. Bladder radiation 19.80 Gy in 11 fractions through 02/19/2020. 4. Pelvic bladder radiation 45 Gy in 25 fractions from 04/08/2020 to 04/14/2020.  NGS Results: Not done  CURRENT THERAPY: Surveillance  BRIEF ONCOLOGIC HISTORY:  Oncology History   No history exists.    CANCER STAGING: Cancer Staging No matching staging information was found for the patient.  INTERVAL HISTORY:  [name], a 75 y.o. male, returns for routine follow-up of his bladder cancer. [name] was last contacted via telephone on 03/31/2020.   Today he is accompanied by his wife and he reports feeling poorly. He was in [facility] from 02/21 to 02/27 for AHF and paroxysmal a-fib after finishing radiation. He also went to [facility] on 01/26 to 01/30 for a-fib with RVR. He also complains of having left buttocks pain and leg swelling. The buttocks pain is currently 7-8/10 and at worst it is 10/10. He takes oxycodone 10 mg every 4 hours for his pain. His taste is gone and his appetite is decreased, so he forces himself to eat. He also notes that he has an open pressure ulcer over his coccyx and is using Silvadene, but it is not helping.  He is ambulating with a walker at home due to swelling in his legs, which is improving. He is not sure about doing radiation on his ischial and iliac bone lesions.   REVIEW OF SYSTEMS:  Review of Systems  Constitutional: Positive for appetite change (50%) and fatigue (depleted).  Cardiovascular: Positive for leg swelling.  Gastrointestinal: Positive for constipation (alternates w/ diarrhea), diarrhea (alternates w/ constipation) and nausea.   Musculoskeletal: Positive for back pain (8/10 left buttocks pain).  Psychiatric/Behavioral: Positive for sleep disturbance.  All other systems reviewed and are negative.   PAST MEDICAL/SURGICAL HISTORY:  Past Medical History:  Diagnosis Date  . Anemia 12/2019  . Bladder cancer ([place])   . CAD (coronary artery disease)    a. 2007 - DES x 2 proximal to mid RCA b. 09/2017: DES to mid LAD and OM2. Patent stents along RCA.   [name] CHF (congestive heart failure) ([place])   . Essential hypertension   . History of atrial flutter    Status post ablation 2008 - Dr. Caryl Comes  . History of transient ischemic attack (TIA)   . Mixed hyperlipidemia    Statin intolerance  . Pneumonia   . Type 2 diabetes mellitus ([facility])    A1C 11.5 06/2017   Past Surgical History:  Procedure Laterality Date  . BALLOON ANGIOPLASTY, ARTERY    . BICEPS TENDON REPAIR Left   . CARDIAC ELECTROPHYSIOLOGY MAPPING AND ABLATION    . CARPAL TUNNEL RELEASE Bilateral   . cervical neck fusion     x 4  . CORONARY ANGIOPLASTY    . CORONARY STENT INTERVENTION N/A 10/17/2017   Procedure: CORONARY STENT INTERVENTION;  Surgeon: Martinique, Peter M, MD;  Location: [place] [facility];  Service: Cardiovascular;  Laterality: N/A;  . CORONARY STENT PLACEMENT     x 2  . CYSTOSCOPY W/ RETROGRADES Bilateral 10/08/2019   Procedure: CYSTOSCOPY WITH BILATERAL RETROGRADE PYELOGRAM;  Surgeon: Cleon Gustin, MD;  Location: [facility];  Service: Urology;  Laterality: Bilateral;  . CYSTOSCOPY W/ URETERAL STENT PLACEMENT Bilateral 01/26/2020   Procedure: CYSTOSCOPY WITH RETROGRADE PYELOGRAM bilateral right ureter stent removal;  Surgeon: Irine Seal, MD;  Location: [facility];  Service: Urology;  Laterality: Bilateral;  . CYSTOSCOPY WITH FULGERATION N/A 01/26/2020   Procedure: CYSTOSCOPY WITH FULGERATION, CLOT EVACUATION transurethral resection bladder tumor;  Surgeon: Irine Seal, MD;  Location: [facility];  Service: Urology;  Laterality: N/A;  . CYSTOSCOPY WITH  URETHRAL DILATATION  10/08/2019   Procedure: CYSTOSCOPY WITH URETHRAL DILATATION;  Surgeon: Cleon Gustin, MD;  Location: [facility];  Service: Urology;;  . LEFT HEART CATH AND CORONARY ANGIOGRAPHY N/A 10/17/2017   Procedure: LEFT HEART CATH AND CORONARY ANGIOGRAPHY;  Surgeon: Martinique, Peter M, MD;  Location: [facility];  Service: Cardiovascular;  Laterality: N/A;  . ROTATOR CUFF REPAIR Right    x 4  . TIBIA FRACTURE SURGERY Left   . TRANSURETHRAL RESECTION OF BLADDER TUMOR N/A 10/08/2019   Procedure: TRANSURETHRAL RESECTION OF BLADDER TUMOR (TURBT);  Surgeon: Cleon Gustin, MD;  Location: [facility];  Service: Urology;  Laterality: N/A;    SOCIAL HISTORY:  Social History   Socioeconomic History  . Marital status: Married    Spouse name: Not on file  . Number of children: 4  . Years of education: Not on file  . Highest education level: Not on file  Occupational History  . Occupation: truck Geophysicist/field seismologist    Comment: retired  Tobacco Use  . Smoking status: Current Every Day Smoker    Packs/day: 0.50    Years: 54.00    Pack years: 27.00    Types: Cigarettes  . Smokeless tobacco: Never Used  Vaping Use  . Vaping Use: Never used  Substance and Sexual Activity  . Alcohol use: No    Alcohol/week: 0.0 standard drinks    Comment: rare  . Drug use: No  . Sexual activity: Not Currently  Other Topics Concern  . Not on file  Social History Narrative  . Not on file   Social Determinants of Health   Financial Resource Strain: Low Risk   . Difficulty of Paying Living Expenses: Not hard at all  Food Insecurity: No Food Insecurity  . Worried About Charity fundraiser in the Last Year: Never true  . Ran Out of Food in the Last Year: Never true  Transportation Needs: No Transportation Needs  . Lack of Transportation (Medical): No  . Lack of Transportation (Non-Medical): No  Physical Activity: Inactive  . Days of Exercise per Week: 0 days  . Minutes of Exercise per Session: 0 min   Stress: No Stress Concern Present  . Feeling of Stress : Not at all  Social Connections: Moderately Isolated  . Frequency of Communication with Friends and Family: Three times a week  . Frequency of Social Gatherings with Friends and Family: Three times a week  . Attends Religious Services: Never  . Active Member of Clubs or Organizations: No  . Attends Archivist Meetings: Never  . Marital Status: Married  Human resources officer Violence: Not At Risk  . Fear of Current or Ex-Partner: No  . Emotionally Abused: No  . Physically Abused: No  . Sexually Abused: No    FAMILY HISTORY:  Family History  Problem Relation Age of Onset  . Diabetes Mother   . Heart disease Mother   . Heart disease Brother   . Lung cancer Brother   . Emphysema Father   .  Lung cancer Brother        x 2  . Lupus Daughter   . Colon cancer Neg Hx   . Esophageal cancer Neg Hx   . Rectal cancer Neg Hx   . Stomach cancer Neg Hx   . Prostate cancer Neg Hx     CURRENT MEDICATIONS:  Current Outpatient Medications  Medication Sig Dispense Refill  . ALPRAZolam (XANAX) 0.5 MG tablet Take 0.5 mg by mouth 2 (two) times daily as needed for sleep.    [name] amiodarone (PACERONE) 200 MG tablet Take 1 tablet (200 mg total) by mouth daily. 90 tablet 3  . aspirin EC 81 MG tablet Take 1 tablet (81 mg total) by mouth daily with breakfast. Swallow whole. 90 tablet 3  . atorvastatin (LIPITOR) 10 MG tablet Take 10 mg by mouth daily.    . B-D ULTRAFINE III SHORT PEN 31G X 8 MM MISC Inject into the skin.    . carvedilol (COREG) 12.5 MG tablet Take 1 tablet (12.5 mg total) by mouth 2 (two) times daily with a meal. 60 tablet 3  . empagliflozin (JARDIANCE) 10 MG TABS tablet Take 1 tablet (10 mg total) by mouth daily. 30 tablet 3  . empagliflozin (JARDIANCE) 10 MG TABS tablet Take by mouth daily.    [name] glipiZIDE (GLUCOTROL XL) 10 MG 24 hr tablet Take 10 mg by mouth 2 (two) times daily.    . hydrALAZINE (APRESOLINE) 25 MG tablet  Take 1 tablet (25 mg total) by mouth 2 (two) times daily. 60 tablet 2  . isosorbide-hydrALAZINE (BIDIL) 20-37.5 MG tablet Take 1 tablet by mouth 3 (three) times daily. 30 tablet 3  . metFORMIN (GLUCOPHAGE) 1000 MG tablet Take 1,000 mg by mouth 2 (two) times daily.    . nicotine (NICODERM CQ - DOSED IN MG/24 HOURS) 14 mg/24hr patch Place 1 patch (14 mg total) onto the skin daily. 28 patch 0  . nitroGLYCERIN (NITROSTAT) 0.4 MG SL tablet Place 1 tablet (0.4 mg total) under the tongue every 5 (five) minutes as needed for chest pain. 25 tablet 3  . Oxycodone HCl 10 MG TABS Take 1 tablet (10 mg total) by mouth 4 (four) times daily as needed. (Patient taking differently: Take 10 mg by mouth 4 (four) times daily as needed (For pain).) 60 tablet 0  . Potassium Chloride ER 20 MEQ TBCR Take 20 mEq by mouth daily. -While taking torsemide 30 tablet 1  . torsemide 40 MG TABS Take 40 mg by mouth See admin instructions. Take 40 mg every morning and 20 mg every evening 90 tablet 3  . acetaminophen (TYLENOL) 325 MG tablet Take 2 tablets (650 mg total) by mouth every 6 (six) hours as needed for mild pain (or Fever >/= 101). (Patient not taking: Reported on 04/29/2020) 12 tablet 0   No current facility-administered medications for this visit.    ALLERGIES:  Allergies  Allergen Reactions  . Statins Other (See Comments)    Leg pain, tolerates lipitor     PHYSICAL EXAM:  Performance status (ECOG): 2 - Symptomatic, <50% confined to bed  Vitals:   04/29/20 1425  BP: (!) 121/52  Pulse: (!) 57  Resp: 18  Temp: (!) 97.4 F (36.3 C)  SpO2: 97%   Wt Readings from Last 3 Encounters:  04/29/20 159 lb 9.8 oz (72.4 kg)  04/22/20 184 lb 4.8 oz (83.6 kg)  04/20/20 180 lb 14.4 oz (82.1 kg)   Physical Exam Vitals reviewed.  Constitutional:  Appearance: Normal appearance.     Comments: In wheelchair  Cardiovascular:     Rate and Rhythm: Normal rate and regular rhythm.     Pulses: Normal pulses.     Heart  sounds: Normal heart sounds.  Pulmonary:     Effort: Pulmonary effort is normal.     Breath sounds: Normal breath sounds.  Musculoskeletal:     Cervical back: No tenderness or bony tenderness.     Thoracic back: No tenderness or bony tenderness.     Lumbar back: No tenderness or bony tenderness.  Neurological:     General: No focal deficit present.     Mental Status: He is alert and oriented to person, place, and time.  Psychiatric:        Mood and Affect: Mood normal.        Behavior: Behavior normal.      LABORATORY DATA:  I have reviewed the labs as listed.  CBC Latest Ref Rng & Units 04/17/2020 04/15/2020 04/14/2020  WBC 4.0 - 10.5 K/uL 4.8 4.5 4.5  Hemoglobin 13.0 - 17.0 g/dL 9.4(L) 9.8(L) 9.8(L)  Hematocrit 39.0 - 52.0 % 31.5(L) 30.9(L) 31.9(L)  Platelets 150 - 400 K/uL 175 186 183   CMP Latest Ref Rng & Units 04/20/2020 04/19/2020 04/18/2020  Glucose 70 - 99 mg/dL 132(H) 168(H) 172(H)  BUN 8 - 23 mg/dL 29(H) 32(H) 32(H)  Creatinine 0.61 - 1.24 mg/dL 1.81(H) 1.70(H) 1.74(H)  Sodium 135 - 145 mmol/L 132(L) 139 138  Potassium 3.5 - 5.1 mmol/L 5.0 5.2(H) 4.2  Chloride 98 - 111 mmol/L 93(L) 98 99  CO2 22 - 32 mmol/L 29 30 31   Calcium 8.9 - 10.3 mg/dL 8.9 9.2 8.8(L)  Total Protein 6.5 - 8.1 g/dL - - -  Total Bilirubin 0.3 - 1.2 mg/dL - - -  Alkaline Phos 38 - 126 U/L - - -  AST 15 - 41 U/L - - -  ALT 0 - 44 U/L - - -    DIAGNOSTIC IMAGING:  I have independently reviewed the scans and discussed with the patient. NM PET Image Initial (PI) Skull Base To Thigh  Result Date: 04/25/2020 CLINICAL DATA:  Initial treatment strategy for tumor type. EXAM: NUCLEAR MEDICINE PET SKULL BASE TO THIGH TECHNIQUE: 8.9 mCi F-18 FDG was injected intravenously. Full-ring PET imaging was performed from the skull base to thigh after the radiotracer. CT data was obtained and used for attenuation correction and anatomic localization. Fasting blood glucose: 128 mg/dl COMPARISON:  Lumbar spine 03/27/2020,  CT 01/25/2020 FINDINGS: Mediastinal blood pool activity: SUV max 1.8 Liver activity: SUV max NA NECK: No hypermetabolic lymph nodes in the neck. Incidental CT findings: none CHEST: Hypermetabolic LEFT hilar lymph node with SUV max equal 6.7. There are multiple small bilateral pulmonary nodules of varying size. The larger nodules have associated metabolic activity. For example RIGHT upper lobe nodule measuring 9 mm with SUV max equal 4.1. Two peripheral nodules at the same level in the RIGHT upper lobe measuring 8 mm each also have metabolic activity. Small RIGHT lower lobe pulmonary nodule measuring 7 mm on image 89 with SUV max equal 1.7. Several smaller LEFT pulmonary nodules also consistent with metastatic disease. No metabolic activity. Incidental CT findings: none ABDOMEN/PELVIS: No abnormal activity in liver. No hypermetabolic retroperitoneal lymph nodes. There is asymmetric thickening of the LEFT bladder wall. Bladder not well assessed due intense activity radiotracer within the bladder urine. Incidental CT findings: none SKELETON: Large expansile lesion within the RIGHT inferior pubic ramus  measures 2.9 x 5.2 cm with SUV max equal 11.0. Two hypermetabolic lesions in the LEFT iliac bone. The larger posterior iliac bone is expansile extending through the cortex measuring 4.9 cm with SUV max equal 8.8. Smaller lesion the iliac wing. Additional smaller lesion in the RIGHT inferior pubic ramus adjacent to the pubic symphysis SUV max equal 9.5. Incidental CT findings: none IMPRESSION: 1. Large expansile intensely hypermetabolic lesions within the pelvis consistent with aggressive metastatic carcinoma. Large lesions within the LEFT inferior pubic ramus and posterior LEFT iliac bone. Smaller lesions in the LEFT iliac wing and RIGHT ischium. 2. Multiple hypermetabolic pulmonary nodules of varying size consistent with widespread pulmonary metastasis. 3. Hypermetabolic LEFT hilar lymph node. 4. No metastatic adenopathy  in the abdomen or pelvis. Electronically Signed   By: Suzy Bouchard M.D.   On: 04/25/2020 14:09   US Venous Img Lower Bilateral  Result Date: 04/14/2020 CLINICAL DATA:  Lower extremity edema and pain EXAM: BILATERAL LOWER EXTREMITY VENOUS DUPLEX ULTRASOUND TECHNIQUE: Gray-scale sonography with graded compression, as well as color Doppler and duplex ultrasound were performed to evaluate the lower extremity deep venous systems from the level of the common femoral vein and including the common femoral, femoral, profunda femoral, popliteal and calf veins including the posterior tibial, peroneal and gastrocnemius veins when visible. The superficial great saphenous vein was also interrogated. Spectral Doppler was utilized to evaluate flow at rest and with distal augmentation maneuvers in the common femoral, femoral and popliteal veins. COMPARISON:  None. FINDINGS: RIGHT LOWER EXTREMITY Common Femoral Vein: No evidence of thrombus. Normal compressibility, respiratory phasicity and response to augmentation. Saphenofemoral Junction: No evidence of thrombus. Normal compressibility and flow on color Doppler imaging. Profunda Femoral Vein: No evidence of thrombus. Normal compressibility and flow on color Doppler imaging. Femoral Vein: No evidence of thrombus. Normal compressibility, respiratory phasicity and response to augmentation. Popliteal Vein: No evidence of thrombus. Normal compressibility, respiratory phasicity and response to augmentation. Calf Veins: No evidence of thrombus. Normal compressibility and flow on color Doppler imaging. Superficial Great Saphenous Vein: No evidence of thrombus. Normal compressibility. Venous Reflux:  None. Other Findings:  None. LEFT LOWER EXTREMITY Common Femoral Vein: No evidence of thrombus. Normal compressibility, respiratory phasicity and response to augmentation. Saphenofemoral Junction: No evidence of thrombus. Normal compressibility and flow on color Doppler imaging.  Profunda Femoral Vein: No evidence of thrombus. Normal compressibility and flow on color Doppler imaging. Femoral Vein: No evidence of thrombus. Normal compressibility, respiratory phasicity and response to augmentation. Popliteal Vein: No evidence of thrombus. Normal compressibility, respiratory phasicity and response to augmentation. Calf Veins: No evidence of thrombus. Normal compressibility and flow on color Doppler imaging. Superficial Great Saphenous Vein: No evidence of thrombus. Normal compressibility. Venous Reflux:  None. Other Findings:  Lower extremity edema noted bilaterally. IMPRESSION: No evidence of deep venous thrombosis in either lower extremity. Lower extremity soft tissue edema noted bilaterally. Electronically Signed   By: Lowella Grip III M.D.   On: 04/14/2020 19:15   DG Chest Port 1 View  Result Date: 04/14/2020 CLINICAL DATA:  Shortness of breath EXAM: PORTABLE CHEST 1 VIEW COMPARISON:  03/21/2020 FINDINGS: Trace pleural effusions. Stable cardiomediastinal silhouette with aortic atherosclerosis. Mild central vascular congestion with overall decreased edema. No consolidation or pneumothorax. IMPRESSION: Mild central vascular congestion with trace pleural effusions. Electronically Signed   By: Donavan Foil M.D.   On: 04/14/2020 18:23     ASSESSMENT:  1. High-grade muscle invasive bladder cancer: -Presentation to the ER with TIA symptoms  and hematuria. -24 pound weight loss in the last 6 months despite having good appetite and eating well. -CTAP on 09/16/2019 shows mass in the left aspect of the anterior bladder dome measuring 2.7 x 2.3 x 1.8 cm.No evidence of lymphadenopathy or metastatic disease in the abdomen or pelvis. Prominent subcentimeter retroperitoneal lymph nodes unchanged from 2007. -Cystoscopy, bilateral retrograde pyelography, transurethral resection of bladder tumor and right JJ ureteral stent placement on 10/08/2019. Cystoscopy showed 5 cm dome/posterior wall  tumor. 1 cm papillary tumor involving the right ureteral orifice. 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 Carcinoma invades muscularis propria. -He met with Dr. Tammi Klippel and discussed the option of chemoradiation. -He also met with Dr. Tresa Moore and discussed surgical options. He is reluctant to consider surgery. -Bone scan on 11/08/2019 was negative for meta stasis. -CT chest on 11/21/2019 shows no suspicious findings for metastatic disease. Small mediastinal lymph nodes likely reactive.  2. Social/family history: -He is a retired Programmer, systems. Current active smoker, 1 pack/day for 57 years. He is seen with his wife who is a retired Therapist, sports at [facility]. -2 brothers had lung cancer and were smokers. Granddaughter died of neuroblastoma at age 12.   PLAN:  1.  Metastatic bladder cancer: -I have reviewed the results of the PET scan from 04/24/2020. -I have reviewed images with the patient and his wife. -1 lesions in the right inferior pubic ramus, left posterior iliac bone, and smaller lesion in the iliac wing.  Additional small lesion in the right inferior pubic ramus.  Multiple hypermetabolic small pulmonary nodules and hilar lymph node. -We discussed the options including best supportive care in the form of hospice versus active therapy.  He is not a candidate for cisplatin. -If he wants to have treatment, he will be offered pembrolizumab which can also be tolerated better.  We discussed side effects in detail. -He will come back in 3 to 4 weeks to let us know his decision.  2. Poorly controlled diabetes: -Continue glipizide and Metformin.  3. Neuropathy: -He has occasional numbness in the toes when his feet are swollen.  4. CHF: -He was apparently recently hospitalized and had a lot of diuresis and lost quite a bit of weight.  He will continue Lasix 60 mg daily.  5.  Left low back pain: -Continue  oxycodone 10 mg every 4 hours as needed. -I will reach out to Dr. Tammi Klippel for radiation of the left iliac lesion which is causing his pain.  6.  Anxiety: -Continue Xanax 0.5 mg as needed.   Orders placed this encounter:  No orders of the defined types were placed in this encounter.    Derek Jack, MD [place] [phone]   I, Milinda Antis, am acting as a scribe for Dr. Sanda Linger.  I, Derek Jack MD, have reviewed the above documentation for accuracy and completeness, and I agree with the above.

## 2020-05-01 ENCOUNTER — Other Ambulatory Visit: Payer: Self-pay

## 2020-05-01 ENCOUNTER — Ambulatory Visit (INDEPENDENT_AMBULATORY_CARE_PROVIDER_SITE_OTHER): Payer: Medicare Other | Admitting: Urology

## 2020-05-01 ENCOUNTER — Encounter: Payer: Self-pay | Admitting: Urology

## 2020-05-01 VITALS — BP 110/50 | HR 61 | Temp 98.8°F | Ht 72.0 in | Wt 170.0 lb

## 2020-05-01 DIAGNOSIS — G893 Neoplasm related pain (acute) (chronic): Secondary | ICD-10-CM | POA: Diagnosis not present

## 2020-05-01 DIAGNOSIS — C671 Malignant neoplasm of dome of bladder: Secondary | ICD-10-CM

## 2020-05-01 DIAGNOSIS — C7802 Secondary malignant neoplasm of left lung: Secondary | ICD-10-CM | POA: Diagnosis not present

## 2020-05-01 DIAGNOSIS — R310 Gross hematuria: Secondary | ICD-10-CM

## 2020-05-01 DIAGNOSIS — C7951 Secondary malignant neoplasm of bone: Secondary | ICD-10-CM | POA: Diagnosis not present

## 2020-05-01 DIAGNOSIS — C7801 Secondary malignant neoplasm of right lung: Secondary | ICD-10-CM | POA: Diagnosis not present

## 2020-05-01 LAB — URINALYSIS, ROUTINE W REFLEX MICROSCOPIC
Bilirubin, UA: NEGATIVE
Ketones, UA: NEGATIVE
Leukocytes,UA: NEGATIVE
Nitrite, UA: NEGATIVE
Specific Gravity, UA: 1.01 (ref 1.005–1.030)
Urobilinogen, Ur: 0.2 mg/dL (ref 0.2–1.0)
pH, UA: 6 (ref 5.0–7.5)

## 2020-05-01 LAB — MICROSCOPIC EXAMINATION
Bacteria, UA: NONE SEEN
Epithelial Cells (non renal): NONE SEEN /hpf (ref 0–10)
RBC, Urine: >30 /hpf — AB (ref 0–2)
Renal Epithel, UA: NONE SEEN /hpf
WBC, UA: NONE SEEN /hpf (ref 0–5)

## 2020-05-01 NOTE — Progress Notes (Signed)
Urological Symptom Review  Patient is experiencing the following symptoms: Get up at night to urinate Leakage of urine Stream starts and stops Trouble starting stream Have to strain to urinate Blood in urine(a few clots)   Review of Systems  Gastrointestinal (upper)  : Nausea  Gastrointestinal (lower) : Constipation  Constitutional : Weight loss Fatigue  Skin: Negative for skin symptoms  Eyes: Negative for eye symptoms  Ear/Nose/Throat : Negative for Ear/Nose/Throat symptoms  Hematologic/Lymphatic: Negative for Hematologic/Lymphatic symptoms  Cardiovascular : Leg swelling  Respiratory : Shortness of breath  Endocrine: Negative for endocrine symptoms  Musculoskeletal: Back pain  Neurological: Negative for neurological symptoms  Psychologic: Negative for psychiatric symptoms

## 2020-05-01 NOTE — Progress Notes (Signed)
Subjective:  1. 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 Gross hematuria   3. 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 Malignant neoplasm metastatic to both lungs [facility])     [name] returns today in f/u.  He has a history of Muscle invasive bladder cancer and was treated with radiation therapy.  He saw Dr. Delton Coombes for a review of a PET scan that was done on 04/25/20 and demonstrated bone mets in the pelvis and lung mets.  He is considering additional radiation for pelvic pain and is considering Keytruda.  His UA today has >30 RBC's.  He is starting to see a few clots in the urine.  He takes only 81mg  ASA each morning.   He has some hesitancy but that is variable.   He has been hospitalized twice in the last couple of months for CHF with progressive edema requiring diuresis.   ROS:  ROS:  A complete review of systems was performed.  All systems are negative except for pertinent findings as noted.   ROS  Allergies  Allergen Reactions  . Statins Other (See Comments)    Leg pain, tolerates lipitor     Outpatient Encounter Medications as of 05/01/2020  Medication Sig  . acetaminophen (TYLENOL) 325 MG tablet Take 2 tablets (650 mg total) by mouth every 6 (six) hours as needed for mild pain (or Fever >/= 101).  [name] ALPRAZolam (XANAX) 0.5 MG tablet Take 0.5 mg by mouth 2 (two) times daily as needed for sleep.  [name] amiodarone (PACERONE) 200 MG tablet Take 1 tablet (200 mg total) by mouth daily.  [name] aspirin EC 81 MG tablet Take 1 tablet (81 mg total) by mouth daily with breakfast. Swallow whole.  [name] atorvastatin (LIPITOR) 10 MG tablet Take 10 mg by mouth daily.  . B-D ULTRAFINE III SHORT PEN 31G X 8 MM MISC Inject into the skin.  . carvedilol (COREG) 12.5 MG tablet Take 1 tablet (12.5 mg total) by mouth 2 (two) times daily with a meal.  . empagliflozin (JARDIANCE) 10 MG TABS tablet Take 1 tablet (10 mg total) by mouth daily.  . empagliflozin (JARDIANCE) 10 MG TABS tablet Take by mouth  daily.  [name] glipiZIDE (GLUCOTROL XL) 10 MG 24 hr tablet Take 10 mg by mouth 2 (two) times daily.  . hydrALAZINE (APRESOLINE) 25 MG tablet Take 1 tablet (25 mg total) by mouth 2 (two) times daily.  . isosorbide-hydrALAZINE (BIDIL) 20-37.5 MG tablet Take 1 tablet by mouth 3 (three) times daily.  . metFORMIN (GLUCOPHAGE) 1000 MG tablet Take 1,000 mg by mouth 2 (two) times daily.  . nicotine (NICODERM CQ - DOSED IN MG/24 HOURS) 14 mg/24hr patch Place 1 patch (14 mg total) onto the skin daily.  . nitroGLYCERIN (NITROSTAT) 0.4 MG SL tablet Place 1 tablet (0.4 mg total) under the tongue every 5 (five) minutes as needed for chest pain.  . Oxycodone HCl 10 MG TABS Take 1 tablet (10 mg total) by mouth 4 (four) times daily as needed (For pain).  . Potassium Chloride ER 20 MEQ TBCR Take 20 mEq by mouth daily. -While taking torsemide  . torsemide 40 MG TABS Take 40 mg by mouth See admin instructions. Take 40 mg every morning and 20 mg every evening   No facility-administered encounter medications on file as of 05/01/2020.    Past Medical History:  Diagnosis Date  . Anemia 12/2019  . Bladder cancer ([place])   . CAD (coronary artery disease)  a. 2007 - DES x 2 proximal to mid RCA b. 09/2017: DES to mid LAD and OM2. Patent stents along RCA.   [name] CHF (congestive heart failure) ([place])   . Essential hypertension   . History of atrial flutter    Status post ablation 2008 - Dr. Caryl Comes  . History of transient ischemic attack (TIA)   . Mixed hyperlipidemia    Statin intolerance  . Pneumonia   . Type 2 diabetes mellitus (HCC)    A1C 11.5 06/2017    Past Surgical History:  Procedure Laterality Date  . BALLOON ANGIOPLASTY, ARTERY    . BICEPS TENDON REPAIR Left   . CARDIAC ELECTROPHYSIOLOGY MAPPING AND ABLATION    . CARPAL TUNNEL RELEASE Bilateral   . cervical neck fusion     x 4  . CORONARY ANGIOPLASTY    . CORONARY STENT INTERVENTION N/A 10/17/2017   Procedure: CORONARY STENT INTERVENTION;  Surgeon: Martinique,  Peter M, MD;  Location: [place] [facility];  Service: Cardiovascular;  Laterality: N/A;  . CORONARY STENT PLACEMENT     x 2  . CYSTOSCOPY W/ RETROGRADES Bilateral 10/08/2019   Procedure: CYSTOSCOPY WITH BILATERAL RETROGRADE PYELOGRAM;  Surgeon: Cleon Gustin, MD;  Location: [facility];  Service: Urology;  Laterality: Bilateral;  . CYSTOSCOPY W/ URETERAL STENT PLACEMENT Bilateral 01/26/2020   Procedure: CYSTOSCOPY WITH RETROGRADE PYELOGRAM bilateral right ureter stent removal;  Surgeon: Irine Seal, MD;  Location: [facility];  Service: Urology;  Laterality: Bilateral;  . CYSTOSCOPY WITH FULGERATION N/A 01/26/2020   Procedure: CYSTOSCOPY WITH FULGERATION, CLOT EVACUATION transurethral resection bladder tumor;  Surgeon: Irine Seal, MD;  Location: [facility];  Service: Urology;  Laterality: N/A;  . CYSTOSCOPY WITH URETHRAL DILATATION  10/08/2019   Procedure: CYSTOSCOPY WITH URETHRAL DILATATION;  Surgeon: Cleon Gustin, MD;  Location: [facility];  Service: Urology;;  . LEFT HEART CATH AND CORONARY ANGIOGRAPHY N/A 10/17/2017   Procedure: LEFT HEART CATH AND CORONARY ANGIOGRAPHY;  Surgeon: Martinique, Peter M, MD;  Location: [place] [facility];  Service: Cardiovascular;  Laterality: N/A;  . ROTATOR CUFF REPAIR Right    x 4  . TIBIA FRACTURE SURGERY Left   . TRANSURETHRAL RESECTION OF BLADDER TUMOR N/A 10/08/2019   Procedure: TRANSURETHRAL RESECTION OF BLADDER TUMOR (TURBT);  Surgeon: Cleon Gustin, MD;  Location: [facility];  Service: Urology;  Laterality: N/A;    Social History   Socioeconomic History  . Marital status: Married    Spouse name: Not on file  . Number of children: 4  . Years of education: Not on file  . Highest education level: Not on file  Occupational History  . Occupation: truck Geophysicist/field seismologist    Comment: retired  Tobacco Use  . Smoking status: Current Every Day Smoker    Packs/day: 0.50    Years: 54.00    Pack years: 27.00    Types: Cigarettes  . Smokeless tobacco: Never Used  Vaping  Use  . Vaping Use: Never used  Substance and Sexual Activity  . Alcohol use: No    Alcohol/week: 0.0 standard drinks    Comment: rare  . Drug use: No  . Sexual activity: Not Currently  Other Topics Concern  . Not on file  Social History Narrative  . Not on file   Social Determinants of Health   Financial Resource Strain: Low Risk   . Difficulty of Paying Living Expenses: Not hard at all  Food Insecurity: No Food Insecurity  . Worried About Charity fundraiser  in the Last Year: Never true  . Ran Out of Food in the Last Year: Never true  Transportation Needs: No Transportation Needs  . Lack of Transportation (Medical): No  . Lack of Transportation (Non-Medical): No  Physical Activity: Inactive  . Days of Exercise per Week: 0 days  . Minutes of Exercise per Session: 0 min  Stress: No Stress Concern Present  . Feeling of Stress : Not at all  Social Connections: Moderately Isolated  . Frequency of Communication with Friends and Family: Three times a week  . Frequency of Social Gatherings with Friends and Family: Three times a week  . Attends Religious Services: Never  . Active Member of Clubs or Organizations: No  . Attends Archivist Meetings: Never  . Marital Status: Married  Human resources officer Violence: Not At Risk  . Fear of Current or Ex-Partner: No  . Emotionally Abused: No  . Physically Abused: No  . Sexually Abused: No    Family History  Problem Relation Age of Onset  . Diabetes Mother   . Heart disease Mother   . Heart disease Brother   . Lung cancer Brother   . Emphysema Father   . Lung cancer Brother        x 2  . Lupus Daughter   . Colon cancer Neg Hx   . Esophageal cancer Neg Hx   . Rectal cancer Neg Hx   . Stomach cancer Neg Hx   . Prostate cancer Neg Hx        Objective: Vitals:   05/01/20 1403  BP: (!) 110/50  Pulse: 61  Temp: 98.8 F (37.1 C)     Physical Exam  Lab Results:  PSA No results found for: PSA No results found  for: TESTOSTERONE    Studies/Results: No results found. Results for orders placed during the hospital encounter of 11/14/12  DG Abd 1 View  Narrative CLINICAL DATA:  Abdominal pain and distention with nausea.  EXAM: ABDOMEN - 1 VIEW  COMPARISON:  None.  FINDINGS: The bowel gas pattern is normal. No radio-opaque calculi or other significant radiographic abnormality are seen.  IMPRESSION: Negative.   Electronically Signed By: Inge Rise M.D. On: 11/16/2012 21:57  Results for orders placed during the hospital encounter of 04/14/20  US Venous Img Lower Bilateral  Narrative CLINICAL DATA:  Lower extremity edema and pain  EXAM: BILATERAL LOWER EXTREMITY VENOUS DUPLEX ULTRASOUND  TECHNIQUE: Gray-scale sonography with graded compression, as well as color Doppler and duplex ultrasound were performed to evaluate the lower extremity deep venous systems from the level of the common femoral vein and including the common femoral, femoral, profunda femoral, popliteal and calf veins including the posterior tibial, peroneal and gastrocnemius veins when visible. The superficial great saphenous vein was also interrogated. Spectral Doppler was utilized to evaluate flow at rest and with distal augmentation maneuvers in the common femoral, femoral and popliteal veins.  COMPARISON:  None.  FINDINGS: RIGHT LOWER EXTREMITY  Common Femoral Vein: No evidence of thrombus. Normal compressibility, respiratory phasicity and response to augmentation.  Saphenofemoral Junction: No evidence of thrombus. Normal compressibility and flow on color Doppler imaging.  Profunda Femoral Vein: No evidence of thrombus. Normal compressibility and flow on color Doppler imaging.  Femoral Vein: No evidence of thrombus. Normal compressibility, respiratory phasicity and response to augmentation.  Popliteal Vein: No evidence of thrombus. Normal compressibility, respiratory phasicity and  response to augmentation.  Calf Veins: No evidence of thrombus. Normal compressibility and flow on  color Doppler imaging.  Superficial Great Saphenous Vein: No evidence of thrombus. Normal compressibility.  Venous Reflux:  None.  Other Findings:  None.  LEFT LOWER EXTREMITY  Common Femoral Vein: No evidence of thrombus. Normal compressibility, respiratory phasicity and response to augmentation.  Saphenofemoral Junction: No evidence of thrombus. Normal compressibility and flow on color Doppler imaging.  Profunda Femoral Vein: No evidence of thrombus. Normal compressibility and flow on color Doppler imaging.  Femoral Vein: No evidence of thrombus. Normal compressibility, respiratory phasicity and response to augmentation.  Popliteal Vein: No evidence of thrombus. Normal compressibility, respiratory phasicity and response to augmentation.  Calf Veins: No evidence of thrombus. Normal compressibility and flow on color Doppler imaging.  Superficial Great Saphenous Vein: No evidence of thrombus. Normal compressibility.  Venous Reflux:  None.  Other Findings:  Lower extremity edema noted bilaterally.  IMPRESSION: No evidence of deep venous thrombosis in either lower extremity. Lower extremity soft tissue edema noted bilaterally.   Electronically Signed By: Lowella Grip III M.D. On: 04/14/2020 19:15  IMPRESSION: 1. Large expansile intensely hypermetabolic lesions within the pelvis consistent with aggressive metastatic carcinoma. Large lesions within the LEFT inferior pubic ramus and posterior LEFT iliac bone. Smaller lesions in the LEFT iliac wing and RIGHT ischium. 2. Multiple hypermetabolic pulmonary nodules of varying size consistent with widespread pulmonary metastasis. 3. Hypermetabolic LEFT hilar lymph node. 4. No metastatic adenopathy in the abdomen or pelvis.   Electronically Signed   By: Suzy Bouchard M.D.   On: 04/25/2020 14:09  No results  found for this or any previous visit.  No results found for this or any previous visit.  No results found for this or any previous visit.  No results found for this or any previous visit.  No results found for this or any previous visit.  No results found for this or any previous visit.    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  He is being considered for futher radiation therapy and possibly Keytruda.  I will just have him return in 3 months to monitor his urinary symptoms.   Gross hematuria.  He is passing a small amount of blood.  The PET/CT showed some left bladder wall thickening that could be tumor but it was not metabolically active and is more likely radiation effect.  No intraluminal lesions were noted.   I will have him hold the ASA and contacted [name] who was agreeable with that.    No orders of the defined types were placed in this encounter.    Orders Placed This Encounter  Procedures  . Microscopic Examination  . Urinalysis, Routine w reflex microscopic      Return in about 3 months (around 08/01/2020).   CC: Wannetta Sender, FNP  And Dr. Rozann Lesches.     Irine Seal 05/01/2020 Patient ID: [name], male   DOB: [birth year], 75 y.o.   MRN: [id number]

## 2020-05-07 ENCOUNTER — Telehealth: Payer: Self-pay

## 2020-05-07 ENCOUNTER — Encounter: Payer: Self-pay | Admitting: Urology

## 2020-05-07 ENCOUNTER — Other Ambulatory Visit: Payer: Self-pay

## 2020-05-07 NOTE — Telephone Encounter (Signed)
Spoke with patient in regards to telephone visit with Freeman Caldron PA on 05/09/20 @ 9:00am. Patient advised that this is a telephone visit not an in person visit. Patient verbalized understanding of appointment date and time.

## 2020-05-08 NOTE — Progress Notes (Signed)
Cardiology Office Note    Date:  05/13/2020   ID:  Richard [name], DOB 1945-09-10, MRN [id number]   PCP:  Wannetta Sender, [place]  Cardiologist:  Rozann Lesches, MD  Advanced Practice Provider:  No care team member to display Electrophysiologist:  None   [id number]   Chief Complaint  Patient presents with  . Follow-up    History of Present Illness:  Richard [name] is a 75 y.o. male with history CAD status post DES x2 to the proximal mid RCA 2007, DES to the mid LAD and OM 2 09/2017 patent stents in the RCA at that time.  Off all antiplatelet therapy and is a poor candidate for PCI because we cannot safely use DAPT.  Ischemic cardiomyopathy LVEF 30 to 35%, bladder CA, also has hypertension, history of atrial flutter status post ablation in 2008, HLD statin intolerant, TIA, DM type II, history of AKI. Monitor 01/24/20 showed Atrial fib per notes. Echo 01/24/20 EF 30-35% mild MR AV sclerosis started on amiodarone was to taper from loading dose 400 bid to 200 mg daily Not anticoagulated due to hematuria and bladder cancer Also on Toprol 100 gm daily Not on ACE/ARB/ARNI due to renal failure DAPT stopped for CAD due to hematuria and no ischemic evaluation Planned due to active Rx of bladder cancer    Discharged 03/23/20 Afib with RVR and acute on chronic CHF. Rate controlled with Amio and Toprol. CHADSVASC=6 but not a candidate of CV or anticoag due to hematuria from bladder CA.   Patient saw [name] 03/11/20 with 8 lb weight gain and fluid overload. Refused admission because he had his last radiation treatment that Monday. was on demadex 40 bid. He was admitted 04/15/20 and diuresed. Sent home 04/20/20 on jardiance but not entresto b/c of rising renal function.   I saw the patient 04/22/20 and he had upper thigh swelling but overall I felt he was compensated. Labs 05/09/20 Crt up 2.33 and Dr. Marlou Porch said to hold torsemide afternoon dose.   [name] ASA stopped by urologist. Has lost 16 lbs since he was here, didn't get the message to hold pm demadex until today.    Past Medical History:  Diagnosis Date  . Anemia 12/2019  . Bladder cancer ([place])   . CAD (coronary artery disease)    a. 2007 - DES x 2 proximal to mid RCA b. 09/2017: DES to mid LAD and OM2. Patent stents along RCA.   [name] CHF (congestive heart failure) ([place])   . Essential hypertension   . History of atrial flutter    Status post ablation 2008 - Dr. Caryl Comes  . History of transient ischemic attack (TIA)   . Mixed hyperlipidemia    Statin intolerance  . Pneumonia   . Type 2 diabetes mellitus (HCC)    A1C 11.5 06/2017    Past Surgical History:  Procedure Laterality Date  . BALLOON ANGIOPLASTY, ARTERY    . BICEPS TENDON REPAIR Left   . CARDIAC ELECTROPHYSIOLOGY MAPPING AND ABLATION    . CARPAL TUNNEL RELEASE Bilateral   . cervical neck fusion     x 4  . CORONARY ANGIOPLASTY    . CORONARY STENT INTERVENTION N/A 10/17/2017   Procedure: CORONARY STENT INTERVENTION;  Surgeon: Martinique, Peter M, MD;  Location: [facility];  Service: Cardiovascular;  Laterality: N/A;  .  CORONARY STENT PLACEMENT     x 2  . CYSTOSCOPY W/ RETROGRADES Bilateral 10/08/2019   Procedure: CYSTOSCOPY WITH BILATERAL RETROGRADE PYELOGRAM;  Surgeon: Cleon Gustin, MD;  Location: [facility];  Service: Urology;  Laterality: Bilateral;  . CYSTOSCOPY W/ URETERAL STENT PLACEMENT Bilateral 01/26/2020   Procedure: CYSTOSCOPY WITH RETROGRADE PYELOGRAM bilateral right ureter stent removal;  Surgeon: Irine Seal, MD;  Location: [facility];  Service: Urology;  Laterality: Bilateral;  . CYSTOSCOPY WITH FULGERATION N/A 01/26/2020   Procedure: CYSTOSCOPY WITH FULGERATION, CLOT EVACUATION transurethral resection bladder tumor;  Surgeon: Irine Seal, MD;  Location: [facility];  Service: Urology;  Laterality: N/A;  .  CYSTOSCOPY WITH URETHRAL DILATATION  10/08/2019   Procedure: CYSTOSCOPY WITH URETHRAL DILATATION;  Surgeon: Cleon Gustin, MD;  Location: [facility];  Service: Urology;;  . LEFT HEART CATH AND CORONARY ANGIOGRAPHY N/A 10/17/2017   Procedure: LEFT HEART CATH AND CORONARY ANGIOGRAPHY;  Surgeon: Martinique, Peter M, MD;  Location: [facility];  Service: Cardiovascular;  Laterality: N/A;  . ROTATOR CUFF REPAIR Right    x 4  . TIBIA FRACTURE SURGERY Left   . TRANSURETHRAL RESECTION OF BLADDER TUMOR N/A 10/08/2019   Procedure: TRANSURETHRAL RESECTION OF BLADDER TUMOR (TURBT);  Surgeon: Cleon Gustin, MD;  Location: [facility];  Service: Urology;  Laterality: N/A;    Current Medications: Current Meds  Medication Sig  . acetaminophen (TYLENOL) 325 MG tablet Take 2 tablets (650 mg total) by mouth every 6 (six) hours as needed for mild pain (or Fever >/= 101).  [name] ALPRAZolam (XANAX) 0.5 MG tablet Take 0.5 mg by mouth 2 (two) times daily as needed for sleep.  [name] amiodarone (PACERONE) 200 MG tablet Take 1 tablet (200 mg total) by mouth daily.  [name] atorvastatin (LIPITOR) 10 MG tablet Take 10 mg by mouth daily.  . B-D ULTRAFINE III SHORT PEN 31G X 8 MM MISC Inject into the skin.  . carvedilol (COREG) 12.5 MG tablet Take 1 tablet (12.5 mg total) by mouth 2 (two) times daily with a meal.  . empagliflozin (JARDIANCE) 10 MG TABS tablet Take 1 tablet (10 mg total) by mouth daily.  [name] glipiZIDE (GLUCOTROL XL) 10 MG 24 hr tablet Take 10 mg by mouth 2 (two) times daily.  . hydrALAZINE (APRESOLINE) 25 MG tablet Take 1 tablet (25 mg total) by mouth 2 (two) times daily.  . nitroGLYCERIN (NITROSTAT) 0.4 MG SL tablet Place 1 tablet (0.4 mg total) under the tongue every 5 (five) minutes as needed for chest pain.  . Oxycodone HCl 10 MG TABS Take 1 tablet (10 mg total) by mouth 4 (four) times daily as needed (For pain).  . Potassium Chloride ER 20 MEQ TBCR Take 20 mEq by mouth daily. -While taking torsemide  . torsemide  (DEMADEX) 20 MG tablet Take 40 mg by mouth daily.     Allergies:   Statins   Social History   Socioeconomic History  . Marital status: Married    Spouse name: Not on file  . Number of children: 4  . Years of education: Not on file  . Highest education level: Not on file  Occupational History  . Occupation: truck Geophysicist/field seismologist    Comment: retired  Tobacco Use  . Smoking status: Current Every Day Smoker    Packs/day: 0.50    Years: 54.00    Pack years: 27.00    Types: Cigarettes  . Smokeless tobacco: Never Used  Vaping Use  . Vaping Use: Never used  Substance and Sexual Activity  . Alcohol use: No    Alcohol/week: 0.0 standard drinks    Comment: rare  . Drug use: No  . Sexual activity: Not Currently  Other Topics Concern  . Not on file  Social History Narrative  . Not on file   Social Determinants of Health   Financial Resource Strain: Low Risk   . Difficulty of Paying Living Expenses: Not hard at all  Food Insecurity: No Food Insecurity  . Worried About Charity fundraiser in the Last Year: Never true  . Ran Out of Food in the Last Year: Never true  Transportation Needs: No Transportation Needs  . Lack of Transportation (Medical): No  . Lack of Transportation (Non-Medical): No  Physical Activity: Inactive  . Days of Exercise per Week: 0 days  . Minutes of Exercise per Session: 0 min  Stress: No Stress Concern Present  . Feeling of Stress : Not at all  Social Connections: Moderately Isolated  . Frequency of Communication with Friends and Family: Three times a week  . Frequency of Social Gatherings with Friends and Family: Three times a week  . Attends Religious Services: Never  . Active Member of Clubs or Organizations: No  . Attends Archivist Meetings: Never  . Marital Status: Married     Family History:  The patient's family history includes Diabetes in his mother; Emphysema in his father; Heart disease in his brother and mother; Lung cancer in his  brother and brother; Lupus in his daughter.   ROS:   Please see the history of present illness.    ROS All other systems reviewed and are negative.   PHYSICAL EXAM:   VS:  BP (!) 116/50   Pulse 66   Ht 6' (1.829 m)   Wt 168 lb (76.2 kg)   SpO2 96%   BMI 22.78 kg/m   Physical Exam  GEN: Well nourished, well developed, in no acute distress  Neck: no JVD, carotid bruits, or masses Cardiac:RRR; no murmurs, rubs, or gallops  Respiratory:  clear to auscultation bilaterally, normal work of breathing GI: soft, nontender, nondistended, + BS Ext: without cyanosis, clubbing, or edema, Good distal pulses bilaterally Neuro:  Alert and Oriented x 3 Psych: euthymic mood, full affect  Wt Readings from Last 3 Encounters:  05/13/20 168 lb (76.2 kg)  05/01/20 170 lb (77.1 kg)  04/29/20 159 lb 9.8 oz (72.4 kg)      Studies/Labs Reviewed:   EKG:  EKG is not ordered today.  Recent Labs: 04/14/2020: TSH 6.904 04/15/2020: ALT 35 04/17/2020: B Natriuretic Peptide 1,536.0; Hemoglobin 9.4; Platelets 175 04/20/2020: Magnesium 1.9 05/09/2020: BUN 50; Creatinine, Ser 2.33; Potassium 3.5; Sodium 135   Lipid Panel    Component Value Date/Time   CHOL 157 09/16/2019 0614   TRIG 121 09/16/2019 0614   HDL 39 (L) 09/16/2019 0614   CHOLHDL 4.0 09/16/2019 0614   VLDL 24 09/16/2019 0614   LDLCALC 94 09/16/2019 0614    Additional studies/ records that were reviewed today include:  Echocardiogram 01/24/2020:  1. Left ventricular ejection fraction, by estimation, is 30 to 35%. The  left ventricle has moderately decreased function. The left ventricle  demonstrates global hypokinesis. The left ventricular internal cavity size  was mildly dilated. Left ventricular  diastolic parameters are consistent with Grade I diastolic dysfunction  (impaired relaxation). Elevated left atrial pressure.   2. Right ventricular systolic function is normal. The right ventricular  size is normal.   3.  The mitral valve is  normal in structure. Mild mitral valve  regurgitation. No evidence of mitral stenosis.   4. The aortic valve is tricuspid. Aortic valve regurgitation is not  visualized. Mild aortic valve sclerosis is present, with no evidence of  aortic valve stenosis.   5. The inferior vena cava is normal in size with greater than 50%  respiratory variability, suggesting right atrial pressure of 3 mmHg.           Risk Assessment/Calculations:    CHA2DS2-VASc Score = 4  This indicates a 4.8% annual risk of stroke. The patient's score is based upon: CHF History: Yes HTN History: Yes Diabetes History: Yes Stroke History: No Vascular Disease History: No Age Score: 1 Gender Score: 0        ASSESSMENT:    1. Chronic systolic CHF (congestive heart failure) (HCC)   2. Paroxysmal atrial fibrillation ([place])   3. Coronary artery disease involving native coronary artery of native heart without angina pectoris   4. 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  Has lost 16 pounds since I saw him 04/22/2020.  With metastatic bladder CA he is not eating and drinking very well.  Creatinine up to 2.33 on Friday.  We will recheck today because I feel he is dehydrated.  Hold Demadex and potassium until we get labs back and decide what dose to keep him on.  Follow-up in 1 month.  If patient continues to decline from metastatic cancer standpoint we may need to reduce some of his cardiac meds.  Afib with RVR-previous ablation 2008, not candidate for anticoagulation and CHADSVASC=5. Aim for rate control rate controlled on Amio and coreg-rate is controlled today.   CAD with stents listed above.  Has been off antiplatelet therapy and is a poor candidate for PCI since he cannot take DAPT.  Angina currently being treated with  Norvasc Imdur Lipitor and Toprol-no chest pain today.  Aspirin stopped by her urologist because of hematuria   Bladder CA with hematuria status post resection and clot evacuation  finished radiation  but PET scan indicates metastatic disease and he will start 10 rounds of radiation tomorrow to reduce tumor size and for pain control.   History of acute renal insufficiency creatinine 2.33 05/09/2020.  We will recheck today and hold Demadex and potassium until I get his lab results       Shared Decision Making/Informed Consent        Medication Adjustments/Labs and Tests Ordered: Current medicines are reviewed at length with the patient today.  Concerns regarding medicines are outlined above.  Medication changes, Labs and Tests ordered today are listed in the Patient Instructions below. Patient Instructions  Medication Instructions:  Your physician recommends that you continue on your current medications as directed. Please refer to the Current Medication list given to you today.  *If you need a refill on your cardiac medications before your next appointment, please call your pharmacy*   Lab Work: BMET today    If you have labs (blood work) drawn today and your tests are completely normal, you will receive your results only by: [name] MyChart Message (if you have MyChart) OR . A paper copy in the mail If you have any lab test that is abnormal  or we need to change your treatment, we will call you to review the results.   Testing/Procedures: None today   Follow-Up: At [facility], you and your health needs are our priority.  As part of our continuing mission to provide you with exceptional heart care, we have created designated Provider Care Teams.  These Care Teams include your primary Cardiologist (physician) and Advanced Practice Providers (APPs -  Physician Assistants and Nurse Practitioners) who all work together to provide you with the care you need, when you need it.  We  recommend signing up for the patient portal called "MyChart".  Sign up information is provided on this After Visit Summary.  MyChart is used to connect with patients for Virtual Visits (Telemedicine).  Patients are able to view lab/test results, encounter notes, upcoming appointments, etc.  Non-urgent messages can be sent to your provider as well.   To learn more about what you can do with MyChart, go to [email or web address].    Your next appointment:   1 month(s)  The format for your next appointment:   In Person  Provider:   You may see Rozann Lesches, MD or one of the following Advanced Practice Providers on your designated Care Team:    Bernerd Pho, PA-C   Ermalinda Barrios, PA-C     Other Instructions None     Thank you for choosing [facility] !            Signed, Ermalinda Barrios, PA-C  05/13/2020 2:25 PM    [facility] [address], [place], [place]  [address] Phone: [phone]; Fax: [phone]

## 2020-05-09 ENCOUNTER — Other Ambulatory Visit (HOSPITAL_COMMUNITY)
Admission: RE | Admit: 2020-05-09 | Discharge: 2020-05-09 | Disposition: A | Discharge status: Home / Self Care | Payer: Medicare Other | Source: Ambulatory Visit | Attending: Physician Assistant | Admitting: Physician Assistant

## 2020-05-09 ENCOUNTER — Ambulatory Visit
Admission: RE | Admit: 2020-05-09 | Discharge: 2020-05-09 | Disposition: A | Discharge status: Home / Self Care | Payer: Medicare Other | Source: Ambulatory Visit | Attending: Urology | Admitting: Urology

## 2020-05-09 ENCOUNTER — Telehealth: Payer: Self-pay

## 2020-05-09 ENCOUNTER — Other Ambulatory Visit: Payer: Self-pay

## 2020-05-09 DIAGNOSIS — C7951 Secondary malignant neoplasm of bone: Secondary | ICD-10-CM | POA: Insufficient documentation

## 2020-05-09 DIAGNOSIS — C679 Malignant neoplasm of bladder, unspecified: Secondary | ICD-10-CM

## 2020-05-09 DIAGNOSIS — I5022 Chronic systolic (congestive) heart failure: Secondary | ICD-10-CM | POA: Insufficient documentation

## 2020-05-09 LAB — BASIC METABOLIC PANEL
Anion gap: 12 (ref 5–15)
BUN: 50 mg/dL — ABNORMAL HIGH (ref 8–23)
CO2: 30 mmol/L (ref 22–32)
Calcium: 9 mg/dL (ref 8.9–10.3)
Chloride: 93 mmol/L — ABNORMAL LOW (ref 98–111)
Creatinine, Ser: 2.33 mg/dL — ABNORMAL HIGH (ref 0.61–1.24)
GFR, Estimated: 29 mL/min — ABNORMAL LOW (ref >60)
Glucose, Bld: 88 mg/dL (ref 70–99)
Potassium: 3.5 mmol/L (ref 3.5–5.1)
Sodium: 135 mmol/L (ref 135–145)

## 2020-05-09 NOTE — Telephone Encounter (Signed)
Left a voicemail message on Dr. Tomie China nurse voicemail in regards to pain medication refill. Advised that per Freeman Caldron PA patient needs a refill on his pain meds. Left patient contact information on nurse voicemail. TM

## 2020-05-09 NOTE — Progress Notes (Signed)
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    ICD-10-CM   1. 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 male seen at the request of Dr. Delton Coombes. 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 He was not felt to be a good candidate for cystectomy given his other medical comorbidities and was coming in for fulguration of the cancer but developed chest pain and was sent to the ED for evaluation.  Cardiology weighed in and felt that he would not tolerate systemic therapy due to his significant heart disease. He does have known coronary artery disease and interval worsening of left ventricular ejection fraction. At the time of his presentation he was having significant gross hematuria and his hemoglobin was 7.1, but with blood products, his hemoglobin improved to 8.5.  He was initially consulted during his hospital admission on 01/28/2020 to discuss urgent intervention with radiotherapy.  Since he was considered not a candidate for other types of systemic therapy because of his heart disease, he was treated with definitive dose radiotherapy from 01/28/20 - 04/14/20.  He tolerated treatment fairly well although there were multiple interruptions due to several hospitalizations for cardiac issues.   The gross hematuria did resolve with radiation.  He did develop significant low back pain, on the left greater than right, worse with ambulation or activity.  He had an MRI of the lumbar spine on 03/27/2020 which showed degenerative changes in the spine only, without any evidence of metastatic involvement in the lumbar spine.  However, there was noted a large mass lesion in the left posterior iliac bone compatible with metastatic disease and marked interval growth as compared to his CT imaging from 01/25/2020.  Otherwise, no other metastatic disease was noted.  His pain has progressively worsened and has been poorly controlled despite narcotic pain medications.  Therefore, he recently underwent PET scan on 04/24/20. This revealed large expansile, intensely-hypermetabolic lesions within the pelvis with a 5 cm lesion in the right inferior pubic ramus as well as an additional smaller lesion in the right inferior pubic ramus adjacent to the pubic symphysis and 2 hypermetabolic lesions in the left iliac bone.  The larger posterior iliac bone lesion is expansile and extending through the cortex, measuring 4.9 cm with a smaller lesion in the left iliac wing.  Additionally, there were multiple hypermetabolic pulmonary nodules of varying size as well as a hypermetabolic left hilar lymph node but no metastatic adenopathy in the abdomen or pelvis. He met back with Dr. Delton Coombes on 04/29/20 to review the scan results and treatment recommendations. They discussed pembrolizumab as a palliative systemic treatment, which he has not yet decided whether or not to pursue. They also discussed his current pain in the left low back, caused by the lesion in the left iliac bone with a recommendation to pursue palliative radiotherapy  to help with his pain management.  He has been kindly referred back to [place] for consideration of palliative radiation therapy to the painful lesions in the pelvis for pain control. Of note, he reports some recent  scant gross hematuria with occasional passage of small clots over the past week.  He saw Dr. Jeffie Pollock on 05/01/20 and urinalysis was positive for nocturia but did not show any evidence of infection. At this time, he and his wife note his urine is occasionally a dark amber color, sometimes clear and sometimes has a pink tinge.  He is pushing fluids and has a scheduled follow-up with Dr. Jeffie Pollock in 3 months for repeat cystoscopy.  Fortunately, there was no evidence of hypermetabolic activity in the bladder to suggest disease recurrence on his recent PET scan.   PREVIOUS RADIATION THERAPY: Yes  01/28/20 - 04/14/20:  1. The bladder tumor only was initially boosted to 19.8 Gy with 11 additional fractions of 1.8 Gy with a full bladder set-up  2. The bladder and pelvic lymph nodes were subsequently treated to 45 Gy in 25 fractions of 1.8 Gy with empty bladder carrying the total tumor dose to 64.8 Gy  PAST MEDICAL HISTORY:  Past Medical History:  Diagnosis Date  . Anemia 12/2019  . Bladder cancer ([place])   . CAD (coronary artery disease)    a. 2007 - DES x 2 proximal to mid RCA b. 09/2017: DES to mid LAD and OM2. Patent stents along RCA.   [name] CHF (congestive heart failure) ([place])   . Essential hypertension   . History of atrial flutter    Status post ablation 2008 - Dr. Caryl Comes  . History of transient ischemic attack (TIA)   . Mixed hyperlipidemia    Statin intolerance  . Pneumonia   . Type 2 diabetes mellitus ([place])    A1C 11.5 06/2017      PAST SURGICAL HISTORY: Past Surgical History:  Procedure Laterality Date  . BALLOON ANGIOPLASTY, ARTERY    . BICEPS TENDON REPAIR Left   . CARDIAC ELECTROPHYSIOLOGY MAPPING AND ABLATION    . CARPAL TUNNEL RELEASE Bilateral   . cervical neck fusion     x 4  . CORONARY ANGIOPLASTY    . CORONARY STENT INTERVENTION N/A 10/17/2017   Procedure: CORONARY STENT INTERVENTION;  Surgeon: Martinique, Peter M, MD;  Location: [place] [facility];  Service: Cardiovascular;  Laterality:  N/A;  . CORONARY STENT PLACEMENT     x 2  . CYSTOSCOPY W/ RETROGRADES Bilateral 10/08/2019   Procedure: CYSTOSCOPY WITH BILATERAL RETROGRADE PYELOGRAM;  Surgeon: Cleon Gustin, MD;  Location: [facility];  Service: Urology;  Laterality: Bilateral;  . CYSTOSCOPY W/ URETERAL STENT PLACEMENT Bilateral 01/26/2020   Procedure: CYSTOSCOPY WITH RETROGRADE PYELOGRAM bilateral right ureter stent removal;  Surgeon: Irine Seal, MD;  Location: [facility];  Service: Urology;  Laterality: Bilateral;  . CYSTOSCOPY WITH FULGERATION N/A 01/26/2020   Procedure: CYSTOSCOPY WITH FULGERATION, CLOT EVACUATION transurethral resection bladder tumor;  Surgeon: Irine Seal, MD;  Location: [facility];  Service: Urology;  Laterality: N/A;  . CYSTOSCOPY WITH URETHRAL DILATATION  10/08/2019   Procedure: CYSTOSCOPY WITH URETHRAL DILATATION;  Surgeon: Cleon Gustin, MD;  Location: [facility];  Service: Urology;;  . LEFT HEART CATH AND CORONARY ANGIOGRAPHY N/A 10/17/2017   Procedure: LEFT HEART CATH AND CORONARY ANGIOGRAPHY;  Surgeon: Martinique, Peter M, MD;  Location: [place] [facility];  Service: Cardiovascular;  Laterality: N/A;  . ROTATOR CUFF REPAIR Right    x 4  .  TIBIA FRACTURE SURGERY Left   . TRANSURETHRAL RESECTION OF BLADDER TUMOR N/A 10/08/2019   Procedure: TRANSURETHRAL RESECTION OF BLADDER TUMOR (TURBT);  Surgeon: Cleon Gustin, MD;  Location: [facility];  Service: Urology;  Laterality: N/A;    FAMILY HISTORY:  Family History  Problem Relation Age of Onset  . Diabetes Mother   . Heart disease Mother   . Heart disease Brother   . Lung cancer Brother   . Emphysema Father   . Lung cancer Brother        x 2  . Lupus Daughter   . Colon cancer Neg Hx   . Esophageal cancer Neg Hx   . Rectal cancer Neg Hx   . Stomach cancer Neg Hx   . Prostate cancer Neg Hx     SOCIAL HISTORY:  Social History   Socioeconomic History  . Marital status: Married    Spouse name: Not on file  . Number of children: 4  . Years of  education: Not on file  . Highest education level: Not on file  Occupational History  . Occupation: truck Geophysicist/field seismologist    Comment: retired  Tobacco Use  . Smoking status: Current Every Day Smoker    Packs/day: 0.50    Years: 54.00    Pack years: 27.00    Types: Cigarettes  . Smokeless tobacco: Never Used  Vaping Use  . Vaping Use: Never used  Substance and Sexual Activity  . Alcohol use: No    Alcohol/week: 0.0 standard drinks    Comment: rare  . Drug use: No  . Sexual activity: Not Currently  Other Topics Concern  . Not on file  Social History Narrative  . Not on file   Social Determinants of Health   Financial Resource Strain: Low Risk   . Difficulty of Paying Living Expenses: Not hard at all  Food Insecurity: No Food Insecurity  . Worried About Charity fundraiser in the Last Year: Never true  . Ran Out of Food in the Last Year: Never true  Transportation Needs: No Transportation Needs  . Lack of Transportation (Medical): No  . Lack of Transportation (Non-Medical): No  Physical Activity: Inactive  . Days of Exercise per Week: 0 days  . Minutes of Exercise per Session: 0 min  Stress: No Stress Concern Present  . Feeling of Stress : Not at all  Social Connections: Moderately Isolated  . Frequency of Communication with Friends and Family: Three times a week  . Frequency of Social Gatherings with Friends and Family: Three times a week  . Attends Religious Services: Never  . Active Member of Clubs or Organizations: No  . Attends Archivist Meetings: Never  . Marital Status: Married  Human resources officer Violence: Not At Risk  . Fear of Current or Ex-Partner: No  . Emotionally Abused: No  . Physically Abused: No  . Sexually Abused: No    ALLERGIES: Statins  MEDICATIONS:  Current Outpatient Medications  Medication Sig Dispense Refill  . acetaminophen (TYLENOL) 325 MG tablet Take 2 tablets (650 mg total) by mouth every 6 (six) hours as needed for mild pain (or  Fever >/= 101). 12 tablet 0  . ALPRAZolam (XANAX) 0.5 MG tablet Take 0.5 mg by mouth 2 (two) times daily as needed for sleep.    [name] amiodarone (PACERONE) 200 MG tablet Take 1 tablet (200 mg total) by mouth daily. 90 tablet 3  . atorvastatin (LIPITOR) 10 MG tablet Take 10 mg by mouth daily.    [name]  B-D ULTRAFINE III SHORT PEN 31G X 8 MM MISC Inject into the skin.    . carvedilol (COREG) 12.5 MG tablet Take 1 tablet (12.5 mg total) by mouth 2 (two) times daily with a meal. 60 tablet 3  . empagliflozin (JARDIANCE) 10 MG TABS tablet Take 1 tablet (10 mg total) by mouth daily. 30 tablet 3  . empagliflozin (JARDIANCE) 10 MG TABS tablet Take by mouth daily.    [name] glipiZIDE (GLUCOTROL XL) 10 MG 24 hr tablet Take 10 mg by mouth 2 (two) times daily.    . hydrALAZINE (APRESOLINE) 25 MG tablet Take 1 tablet (25 mg total) by mouth 2 (two) times daily. 60 tablet 2  . isosorbide-hydrALAZINE (BIDIL) 20-37.5 MG tablet Take 1 tablet by mouth 3 (three) times daily. 30 tablet 3  . nitroGLYCERIN (NITROSTAT) 0.4 MG SL tablet Place 1 tablet (0.4 mg total) under the tongue every 5 (five) minutes as needed for chest pain. 25 tablet 3  . Oxycodone HCl 10 MG TABS Take 1 tablet (10 mg total) by mouth 4 (four) times daily as needed (For pain). 84 tablet 0  . Potassium Chloride ER 20 MEQ TBCR Take 20 mEq by mouth daily. -While taking torsemide 30 tablet 1  . torsemide 40 MG TABS Take 40 mg by mouth See admin instructions. Take 40 mg every morning and 20 mg every evening 90 tablet 3  . aspirin EC 81 MG tablet Take 1 tablet (81 mg total) by mouth daily with breakfast. Swallow whole. (Patient not taking: Reported on 05/07/2020) 90 tablet 3  . metFORMIN (GLUCOPHAGE) 1000 MG tablet Take 1,000 mg by mouth 2 (two) times daily. (Patient not taking: Reported on 05/07/2020)    . nicotine (NICODERM CQ - DOSED IN MG/24 HOURS) 14 mg/24hr patch Place 1 patch (14 mg total) onto the skin daily. (Patient not taking: Reported on 05/07/2020) 28 patch 0    No current facility-administered medications for this encounter.    REVIEW OF SYSTEMS:  On review of systems, the patient reports that he is doing well overall. He denies any chest pain, shortness of breath, cough, fevers, chills, night sweats, unintended weight changes. He denies any bowel or bladder dysfunction, and denies abdominal pain, nausea or vomiting. He notes some intermittent gross hematuria as above but denies dysuria, excessive frequency, urgency, incomplete emptying or incontinence. He also reports progressive, poorly controlled left low back/buttock pain, rated 8/10 but intermittently 10/10. He reports that it is getting worse, to a point that he is unable to find a comfortable position for any period of time.  He denies any focal weakness in the lower extremities or paresthesias.  A complete review of systems is obtained and is otherwise negative.    PHYSICAL EXAM:  Wt Readings from Last 3 Encounters:  05/01/20 170 lb (77.1 kg)  04/29/20 159 lb 9.8 oz (72.4 kg)  04/22/20 184 lb 4.8 oz (83.6 kg)   Temp Readings from Last 3 Encounters:  05/01/20 98.8 F (37.1 C)  04/29/20 (!) 97.4 F (36.3 C) (Oral)  04/20/20 98.3 F (36.8 C) (Oral)   BP Readings from Last 3 Encounters:  05/01/20 (!) 110/50  04/29/20 (!) 121/52  04/22/20 (!) 116/50   Pulse Readings from Last 3 Encounters:  05/01/20 61  04/29/20 (!) 57  04/22/20 63    /10  Physical exam not performed in light of telephone reconsult visit format.   KPS = 50  100 - Normal; no complaints; no evidence of disease. 90   -  Able to carry on normal activity; minor signs or symptoms of disease. 80   - Normal activity with effort; some signs or symptoms of disease. 68   - Cares for self; unable to carry on normal activity or to do active work. 60   - Requires occasional assistance, but is able to care for most of his personal needs. 50   - Requires considerable assistance and frequent medical care. 52   - Disabled;  requires special care and assistance. 19   - Severely disabled; hospital admission is indicated although death not imminent. 88   - Very sick; hospital admission necessary; active supportive treatment necessary. 10   - Moribund; fatal processes progressing rapidly. 0     - Dead  Karnofsky DA, Abelmann Kunkle, Craver LS and Burchenal [facility] [phone]) The use of the nitrogen mustards in the palliative treatment of carcinoma: with particular reference to bronchogenic carcinoma Cancer 1 634-56  LABORATORY DATA:  Lab Results  Component Value Date   WBC 4.8 04/17/2020   HGB 9.4 (L) 04/17/2020   HCT 31.5 (L) 04/17/2020   MCV 89.2 04/17/2020   PLT 175 04/17/2020   Lab Results  Component Value Date   NA 132 (L) 04/20/2020   K 5.0 04/20/2020   CL 93 (L) 04/20/2020   CO2 29 04/20/2020   Lab Results  Component Value Date   ALT 35 04/15/2020   AST 20 04/15/2020   ALKPHOS 194 (H) 04/15/2020   BILITOT 0.6 04/15/2020     RADIOGRAPHY: NM PET Image Initial (PI) Skull Base To Thigh  Result Date: 04/25/2020 CLINICAL DATA:  Initial treatment strategy for tumor type. EXAM: NUCLEAR MEDICINE PET SKULL BASE TO THIGH TECHNIQUE: 8.9 mCi F-18 FDG was injected intravenously. Full-ring PET imaging was performed from the skull base to thigh after the radiotracer. CT data was obtained and used for attenuation correction and anatomic localization. Fasting blood glucose: 128 mg/dl COMPARISON:  Lumbar spine 03/27/2020, CT 01/25/2020 FINDINGS: Mediastinal blood pool activity: SUV max 1.8 Liver activity: SUV max NA NECK: No hypermetabolic lymph nodes in the neck. Incidental CT findings: none CHEST: Hypermetabolic LEFT hilar lymph node with SUV max equal 6.7. There are multiple small bilateral pulmonary nodules of varying size. The larger nodules have associated metabolic activity. For example RIGHT upper lobe nodule measuring 9 mm with SUV max equal 4.1. Two peripheral nodules at the same level in the RIGHT upper lobe measuring 8  mm each also have metabolic activity. Small RIGHT lower lobe pulmonary nodule measuring 7 mm on image 89 with SUV max equal 1.7. Several smaller LEFT pulmonary nodules also consistent with metastatic disease. No metabolic activity. Incidental CT findings: none ABDOMEN/PELVIS: No abnormal activity in liver. No hypermetabolic retroperitoneal lymph nodes. There is asymmetric thickening of the LEFT bladder wall. Bladder not well assessed due intense activity radiotracer within the bladder urine. Incidental CT findings: none SKELETON: Large expansile lesion within the RIGHT inferior pubic ramus measures 2.9 x 5.2 cm with SUV max equal 11.0. Two hypermetabolic lesions in the LEFT iliac bone. The larger posterior iliac bone is expansile extending through the cortex measuring 4.9 cm with SUV max equal 8.8. Smaller lesion the iliac wing. Additional smaller lesion in the RIGHT inferior pubic ramus adjacent to the pubic symphysis SUV max equal 9.5. Incidental CT findings: none IMPRESSION: 1. Large expansile intensely hypermetabolic lesions within the pelvis consistent with aggressive metastatic carcinoma. Large lesions within the LEFT inferior pubic ramus and posterior LEFT iliac bone. Smaller lesions in the  LEFT iliac wing and RIGHT ischium. 2. Multiple hypermetabolic pulmonary nodules of varying size consistent with widespread pulmonary metastasis. 3. Hypermetabolic LEFT hilar lymph node. 4. No metastatic adenopathy in the abdomen or pelvis. Electronically Signed   By: Suzy Bouchard M.D.   On: 04/25/2020 14:09   US Venous Img Lower Bilateral  Result Date: 04/14/2020 CLINICAL DATA:  Lower extremity edema and pain EXAM: BILATERAL LOWER EXTREMITY VENOUS DUPLEX ULTRASOUND TECHNIQUE: Gray-scale sonography with graded compression, as well as color Doppler and duplex ultrasound were performed to evaluate the lower extremity deep venous systems from the level of the common femoral vein and including the common femoral,  femoral, profunda femoral, popliteal and calf veins including the posterior tibial, peroneal and gastrocnemius veins when visible. The superficial great saphenous vein was also interrogated. Spectral Doppler was utilized to evaluate flow at rest and with distal augmentation maneuvers in the common femoral, femoral and popliteal veins. COMPARISON:  None. FINDINGS: RIGHT LOWER EXTREMITY Common Femoral Vein: No evidence of thrombus. Normal compressibility, respiratory phasicity and response to augmentation. Saphenofemoral Junction: No evidence of thrombus. Normal compressibility and flow on color Doppler imaging. Profunda Femoral Vein: No evidence of thrombus. Normal compressibility and flow on color Doppler imaging. Femoral Vein: No evidence of thrombus. Normal compressibility, respiratory phasicity and response to augmentation. Popliteal Vein: No evidence of thrombus. Normal compressibility, respiratory phasicity and response to augmentation. Calf Veins: No evidence of thrombus. Normal compressibility and flow on color Doppler imaging. Superficial Great Saphenous Vein: No evidence of thrombus. Normal compressibility. Venous Reflux:  None. Other Findings:  None. LEFT LOWER EXTREMITY Common Femoral Vein: No evidence of thrombus. Normal compressibility, respiratory phasicity and response to augmentation. Saphenofemoral Junction: No evidence of thrombus. Normal compressibility and flow on color Doppler imaging. Profunda Femoral Vein: No evidence of thrombus. Normal compressibility and flow on color Doppler imaging. Femoral Vein: No evidence of thrombus. Normal compressibility, respiratory phasicity and response to augmentation. Popliteal Vein: No evidence of thrombus. Normal compressibility, respiratory phasicity and response to augmentation. Calf Veins: No evidence of thrombus. Normal compressibility and flow on color Doppler imaging. Superficial Great Saphenous Vein: No evidence of thrombus. Normal compressibility.  Venous Reflux:  None. Other Findings:  Lower extremity edema noted bilaterally. IMPRESSION: No evidence of deep venous thrombosis in either lower extremity. Lower extremity soft tissue edema noted bilaterally. Electronically Signed   By: Lowella Grip III M.D.   On: 04/14/2020 19:15   DG Chest Port 1 View  Result Date: 04/14/2020 CLINICAL DATA:  Shortness of breath EXAM: PORTABLE CHEST 1 VIEW COMPARISON:  03/21/2020 FINDINGS: Trace pleural effusions. Stable cardiomediastinal silhouette with aortic atherosclerosis. Mild central vascular congestion with overall decreased edema. No consolidation or pneumothorax. IMPRESSION: Mild central vascular congestion with trace pleural effusions. Electronically Signed   By: Donavan Foil M.D.   On: 04/14/2020 18:23      IMPRESSION/PLAN: This visit was conducted via telephone to spare the patient unnecessary potential exposure in the healthcare setting during the current COVID-19 pandemic. 1. 75 y.o. 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 Today, we talked to the patient and his wife about the findings and workup thus far. We discussed the natural history of metastatic bladder cancer and general treatment, highlighting the role of radiotherapy in the management of painful bony metastases. We discussed the available radiation techniques, and focused on the details and logistics of delivery.  The recommendation is to proceed with a 1  to 2-week course of daily palliative radiotherapy to the painful lesions in the bony pelvis.  We reviewed the anticipated acute and late sequelae associated with radiation in this setting. He is familiar with the process as he just recently finished treatment to the bladder. The patient and his wife were encouraged to ask questions that were answered to their satisfaction.  At the end of our conversation, the patient is eager  to move forward with palliative treatment to the painful bony metastases to help with pain control. He is scheduled for CT simulation this Monday, 05/12/20, at 1:30 pm in anticipation of beginning a 1-2 week course of daily palliative radiation therapy on Wednesday, 05/14/2020.  He has provided verbal consent to proceed today but will sign formal written consent at the time of CT simulation/treatment planning on Monday and a copy of this document will be placed in his medical record.  His pain is poorly controlled on his current pain regimen so we recommended that he reach out to Dr. Delton Coombes in regards to possibly increasing the dose or changing his pain medication. We will share our discussion with Dr. Delton Coombes and move forward with treatment planning accordingly.  Given current concerns for patient exposure during the COVID-19 pandemic, this encounter was conducted via telephone. The patient was notified in advance and was offered a [place] meeting to allow for face to face communication but unfortunately reported that he did not have the appropriate resources/technology to support such a visit and instead preferred to proceed with telephone consult. The patient has given verbal consent for this type of encounter. The time spent during this encounter was 45 minutes. The attendants for this meeting include Tyler Pita MD, Ashlyn Bruning PA-C, [place], patient Richard [name] and his wife. During the encounter, Tyler Pita MD, Ashlyn Bruning PA-C, and scribe, Wilburn Mylar were located at [place].  Patient Richard [name] and his wife were located at home.    Nicholos Johns, PA-C    Tyler Pita, MD  [facility] Direct Dial: [phone]  Fax: [phone] [email or web address]  Skype  [email or web address]   This document serves as a record of services personally performed by Tyler Pita, MD and Freeman Caldron, PA-C. It was created on their behalf by Wilburn Mylar, a trained medical scribe. The creation of this record is based on the scribe's personal observations and the provider's statements to them. This document has been checked and approved by the attending provider.

## 2020-05-09 NOTE — Telephone Encounter (Addendum)
Left message for the pt per his DPR with the instructions per Dr. Marlou Porch to hold his 20 mg pm dose of Torsemide due to worsening BUN and Creat. Pt has appt 05/13/20.

## 2020-05-12 ENCOUNTER — Ambulatory Visit
Admission: RE | Admit: 2020-05-12 | Discharge: 2020-05-12 | Disposition: A | Discharge status: Home / Self Care | Payer: Medicare Other | Source: Ambulatory Visit | Attending: Radiation Oncology | Admitting: Radiation Oncology

## 2020-05-12 ENCOUNTER — Other Ambulatory Visit: Payer: Self-pay

## 2020-05-12 ENCOUNTER — Other Ambulatory Visit (HOSPITAL_COMMUNITY): Payer: Self-pay

## 2020-05-12 DIAGNOSIS — C671 Malignant neoplasm of dome of bladder: Secondary | ICD-10-CM | POA: Diagnosis not present

## 2020-05-12 DIAGNOSIS — C679 Malignant neoplasm of bladder, unspecified: Secondary | ICD-10-CM | POA: Diagnosis not present

## 2020-05-12 DIAGNOSIS — C7951 Secondary malignant neoplasm of bone: Secondary | ICD-10-CM | POA: Insufficient documentation

## 2020-05-12 NOTE — Progress Notes (Signed)
  Radiation Oncology         [phone] ________________________________  Name: Richard [name] MRN: [id number]  Date: 05/12/2020  DOB: [birth year]  SIMULATION AND TREATMENT PLANNING NOTE    ICD-10-CM   1. 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  Identity was confirmed.  All relevant records and images related to the planned course of therapy were reviewed.  The patient freely provided informed written consent to proceed with treatment after reviewing the details related to the planned course of therapy. The consent form was witnessed and verified by the simulation staff.  Then, the patient was set-up in a stable reproducible  supine position for radiation therapy.  CT images were obtained.  Surface markings were placed.  The CT images were loaded into the planning software.  Then the target and avoidance structures were contoured.  Treatment planning then occurred.  The radiation prescription was entered and confirmed.  Then, I designed and supervised the construction of a total of 3 medically necessary complex treatment devices consisting of leg positioner and MLC apertures to cover the treated hip area.  I have requested : IMRT planning since the pelvis was previously treated for bladder cancer. Simulation  I have requested a DVH of the following structures: Rectum, Bladder, femoral heads and target.  SPECIAL TREATMENT PROCEDURE:  The planned course of therapy using radiation constitutes a special treatment procedure. Special care is required in the management of this patient for the following reasons. This treatment constitutes a Special Treatment Procedure for the following reason: [ Retreatment in a previously radiated area requiring careful monitoring of increased risk of toxicity due to overlap of  previous treatment..  The special nature of the planned course of radiotherapy will require increased physician supervision and oversight to ensure patient's safety with optimal treatment outcomes.  PLAN:  The patient will receive 30 Gy in 10 fractions.  ________________________________  Sheral Apley Tammi Klippel, M.D.

## 2020-05-12 NOTE — Telephone Encounter (Signed)
Contacted patient who verbalized understanding.

## 2020-05-13 ENCOUNTER — Encounter: Payer: Self-pay | Admitting: Physician Assistant

## 2020-05-13 ENCOUNTER — Telehealth: Payer: Self-pay

## 2020-05-13 ENCOUNTER — Other Ambulatory Visit (HOSPITAL_COMMUNITY)
Admission: RE | Admit: 2020-05-13 | Discharge: 2020-05-13 | Disposition: A | Discharge status: Home / Self Care | Payer: Medicare Other | Source: Ambulatory Visit | Attending: Physician Assistant | Admitting: Physician Assistant

## 2020-05-13 ENCOUNTER — Other Ambulatory Visit: Payer: Self-pay

## 2020-05-13 ENCOUNTER — Ambulatory Visit (INDEPENDENT_AMBULATORY_CARE_PROVIDER_SITE_OTHER): Payer: Medicare Other | Admitting: Physician Assistant

## 2020-05-13 VITALS — BP 116/50 | HR 66 | Ht 72.0 in | Wt 168.0 lb

## 2020-05-13 DIAGNOSIS — I2510 Atherosclerotic heart disease of native coronary artery without angina pectoris: Secondary | ICD-10-CM | POA: Insufficient documentation

## 2020-05-13 DIAGNOSIS — I480 Paroxysmal atrial fibrillation: Secondary | ICD-10-CM | POA: Insufficient documentation

## 2020-05-13 DIAGNOSIS — N179 Acute kidney failure, unspecified: Secondary | ICD-10-CM

## 2020-05-13 DIAGNOSIS — Z79899 Other long term (current) drug therapy: Secondary | ICD-10-CM

## 2020-05-13 DIAGNOSIS — C671 Malignant neoplasm of dome of bladder: Secondary | ICD-10-CM | POA: Diagnosis not present

## 2020-05-13 DIAGNOSIS — I5022 Chronic systolic (congestive) heart failure: Secondary | ICD-10-CM | POA: Diagnosis not present

## 2020-05-13 LAB — BASIC METABOLIC PANEL
Anion gap: 11 (ref 5–15)
BUN: 54 mg/dL — ABNORMAL HIGH (ref 8–23)
CO2: 32 mmol/L (ref 22–32)
Calcium: 9.3 mg/dL (ref 8.9–10.3)
Chloride: 93 mmol/L — ABNORMAL LOW (ref 98–111)
Creatinine, Ser: 2.21 mg/dL — ABNORMAL HIGH (ref 0.61–1.24)
GFR, Estimated: 30 mL/min — ABNORMAL LOW (ref >60)
Glucose, Bld: 67 mg/dL — ABNORMAL LOW (ref 70–99)
Potassium: 3.5 mmol/L (ref 3.5–5.1)
Sodium: 136 mmol/L (ref 135–145)

## 2020-05-13 MED ORDER — POTASSIUM CHLORIDE ER 10 MEQ PO TBCR: 10.0000 meq | EXTENDED_RELEASE_TABLET | Freq: Every day | ORAL | 3 refills | Status: AC

## 2020-05-13 MED ORDER — TORSEMIDE 20 MG PO TABS: 20.0000 mg | ORAL_TABLET | Freq: Every day | ORAL | 3 refills | Status: DC

## 2020-05-13 NOTE — Patient Instructions (Signed)
Medication Instructions:  Your physician recommends that you continue on your current medications as directed. Please refer to the Current Medication list given to you today.  *If you need a refill on your cardiac medications before your next appointment, please call your pharmacy*   Lab Work: BMET today    If you have labs (blood work) drawn today and your tests are completely normal, you will receive your results only by: [name] MyChart Message (if you have MyChart) OR . A paper copy in the mail If you have any lab test that is abnormal or we need to change your treatment, we will call you to review the results.   Testing/Procedures: None today   Follow-Up: At [facility], you and your health needs are our priority.  As part of our continuing mission to provide you with exceptional heart care, we have created designated Provider Care Teams.  These Care Teams include your primary Cardiologist (physician) and Advanced Practice Providers (APPs -  Physician Assistants and Nurse Practitioners) who all work together to provide you with the care you need, when you need it.  We recommend signing up for the patient portal called "MyChart".  Sign up information is provided on this After Visit Summary.  MyChart is used to connect with patients for Virtual Visits (Telemedicine).  Patients are able to view lab/test results, encounter notes, upcoming appointments, etc.  Non-urgent messages can be sent to your provider as well.   To learn more about what you can do with MyChart, go to [email or web address].    Your next appointment:   1 month(s)  The format for your next appointment:   In Person  Provider:   You may see Rozann Lesches, MD or one of the following Advanced Practice Providers on your designated Care Team:    Bernerd Pho, PA-C   Ermalinda Barrios, PA-C     Other Instructions None     Thank you for choosing [facility] !

## 2020-05-13 NOTE — Telephone Encounter (Signed)
Wife called back and we went over new medication instructions.They plan to get lab work on 05/27/20

## 2020-05-13 NOTE — Telephone Encounter (Signed)
-----  Message from Imogene Burn, PA-C sent at 05/13/2020  3:23 PM EDT ----- Kidney function down to 2.21.  Plan to hold Demadex tomorrow and then start back at 20 mg daily on Thursday and potassium 10 mEq daily starting Thursday.  Can take extra Demadex and potassium for weight gain of 2 to 3 pounds overnight or increase in edema.  Follow-up be met in 2 weeks thanks

## 2020-05-13 NOTE — Telephone Encounter (Signed)
I poke with Richard [name] as his wife is unavailable.I explained the med changes and f/u lab work.He will have his wife call back in 30 minutes.

## 2020-05-14 ENCOUNTER — Telehealth: Payer: Self-pay

## 2020-05-14 ENCOUNTER — Ambulatory Visit: Payer: Medicare Other

## 2020-05-14 ENCOUNTER — Ambulatory Visit: Payer: Medicare Other | Admitting: Urology

## 2020-05-14 ENCOUNTER — Ambulatory Visit: Payer: Self-pay | Admitting: Urology

## 2020-05-14 NOTE — Telephone Encounter (Signed)
Spoke with patients wife Lindsey Hommel and advised that telephone appointment today @ 11am was cancelled. Mrs. Deroos states that she was aware of the cancellation and thank you for the call. TM

## 2020-05-15 ENCOUNTER — Encounter (HOSPITAL_COMMUNITY): Payer: Self-pay

## 2020-05-15 ENCOUNTER — Ambulatory Visit: Payer: Medicare Other

## 2020-05-15 ENCOUNTER — Other Ambulatory Visit (HOSPITAL_COMMUNITY): Payer: Self-pay

## 2020-05-15 MED ORDER — OXYCODONE HCL ER 20 MG PO T12A: 20.0000 mg | EXTENDED_RELEASE_TABLET | Freq: Two times a day (BID) | ORAL | 0 refills | Status: AC

## 2020-05-15 NOTE — Progress Notes (Signed)
Call received from patient's wife reporting increased pain. Patient reports taking pain medication every 2.5 hours instead of every 4 hours as prescribed. Patient cancelled RadOnc appt due to pain. Dr. Delton Coombes aware and order received for Oxycontin 20mg  BID. Called patients wife and left VM with instructions.

## 2020-05-16 ENCOUNTER — Ambulatory Visit: Payer: Medicare Other

## 2020-05-19 ENCOUNTER — Ambulatory Visit: Payer: Medicare Other

## 2020-05-20 ENCOUNTER — Other Ambulatory Visit: Payer: Self-pay

## 2020-05-20 ENCOUNTER — Inpatient Hospital Stay (HOSPITAL_BASED_OUTPATIENT_CLINIC_OR_DEPARTMENT_OTHER): Payer: Medicare Other | Admitting: Hematology

## 2020-05-20 ENCOUNTER — Ambulatory Visit: Payer: Medicare Other

## 2020-05-20 VITALS — BP 142/52 | HR 57 | Temp 97.9°F | Resp 17 | Wt 163.6 lb

## 2020-05-20 DIAGNOSIS — C678 Malignant neoplasm of overlapping sites of bladder: Secondary | ICD-10-CM

## 2020-05-20 DIAGNOSIS — R918 Other nonspecific abnormal finding of lung field: Secondary | ICD-10-CM | POA: Diagnosis not present

## 2020-05-20 DIAGNOSIS — Z79899 Other long term (current) drug therapy: Secondary | ICD-10-CM | POA: Diagnosis not present

## 2020-05-20 DIAGNOSIS — Z833 Family history of diabetes mellitus: Secondary | ICD-10-CM | POA: Diagnosis not present

## 2020-05-20 DIAGNOSIS — L89159 Pressure ulcer of sacral region, unspecified stage: Secondary | ICD-10-CM | POA: Diagnosis not present

## 2020-05-20 DIAGNOSIS — Z7984 Long term (current) use of oral hypoglycemic drugs: Secondary | ICD-10-CM | POA: Diagnosis not present

## 2020-05-20 DIAGNOSIS — M545 Low back pain, unspecified: Secondary | ICD-10-CM | POA: Diagnosis not present

## 2020-05-20 DIAGNOSIS — I110 Hypertensive heart disease with heart failure: Secondary | ICD-10-CM | POA: Diagnosis not present

## 2020-05-20 DIAGNOSIS — C679 Malignant neoplasm of bladder, unspecified: Secondary | ICD-10-CM | POA: Diagnosis not present

## 2020-05-20 DIAGNOSIS — Z801 Family history of malignant neoplasm of trachea, bronchus and lung: Secondary | ICD-10-CM | POA: Diagnosis not present

## 2020-05-20 DIAGNOSIS — Z8249 Family history of ischemic heart disease and other diseases of the circulatory system: Secondary | ICD-10-CM | POA: Diagnosis not present

## 2020-05-20 DIAGNOSIS — E119 Type 2 diabetes mellitus without complications: Secondary | ICD-10-CM | POA: Diagnosis not present

## 2020-05-20 DIAGNOSIS — I509 Heart failure, unspecified: Secondary | ICD-10-CM | POA: Diagnosis not present

## 2020-05-20 DIAGNOSIS — Z7982 Long term (current) use of aspirin: Secondary | ICD-10-CM | POA: Diagnosis not present

## 2020-05-20 DIAGNOSIS — F17210 Nicotine dependence, cigarettes, uncomplicated: Secondary | ICD-10-CM | POA: Diagnosis not present

## 2020-05-20 NOTE — Progress Notes (Signed)
[place] [address]   CLINIC:  Medical Oncology/Hematology  PCP:  Wannetta Sender, [place] [address]* [phone]   REASON FOR VISIT:  Follow-up for bladder cancer  PRIOR THERAPY:  1. TURBT on 10/08/2019. 2. TURBT on 01/26/2020. 3. Bladder radiation 19.80 Gy in 11 fractions through 02/19/2020. 4. Pelvic bladder radiation 45 Gy in 25 fractions from 04/08/2020 to 04/14/2020.  NGS Results: Not done  CURRENT THERAPY: Surveillance  BRIEF ONCOLOGIC HISTORY:  Oncology History   No history exists.    CANCER STAGING: Cancer Staging No matching staging information was found for the patient.  INTERVAL HISTORY:  Richard [name], a 75 y.o. male, returns for routine follow-up of his bladder cancer. Richard [name] was last seen on 04/29/2020.   Today he is accompanied by his wife and he reports feeling poorly. He complains of having pain in his posterior left pelvis which radiates down his leg which is barely controlled by Oxycontin 20 mg BID and oxycodone TID-QID for breakthrough pain; he is not drowsy with his pain meds yet. He is also taking Xanax 0.25 mg for anxiety and which makes him sleepy. His appetite is decreased. He cannot tolerate sitting down for too long and cannot drive long distances and is not active anymore; he hardly leaves the house now. He says that he is tired of the pain and is ready to be rid of it.   REVIEW OF SYSTEMS:  Review of Systems  Constitutional: Positive for appetite change (25%) and fatigue (depleted).  Gastrointestinal: Positive for constipation and nausea.  Musculoskeletal: Positive for arthralgias (10/10 L posterior pelvis pain w/ radiation down L leg).  Neurological: Positive for numbness (feet).  Psychiatric/Behavioral: The patient is nervous/anxious (on Xanax).   All other systems reviewed and are negative.   PAST MEDICAL/SURGICAL HISTORY:   Past Medical History:  Diagnosis Date  . Anemia 12/2019  . Bladder cancer ([place])   . CAD (coronary artery disease)    a. 2007 - DES x 2 proximal to mid RCA b. 09/2017: DES to mid LAD and OM2. Patent stents along RCA.   [name] CHF (congestive heart failure) ([place])   . Essential hypertension   . History of atrial flutter    Status post ablation 2008 - Dr. Caryl Comes  . History of transient ischemic attack (TIA)   . Mixed hyperlipidemia    Statin intolerance  . Pneumonia   . Type 2 diabetes mellitus (H[facility])    A1C 11.5 06/2017   Past Surgical History:  Procedure Laterality Date  . BALLOON ANGIOPLASTY, ARTERY    . BICEPS TENDON REPAIR Left   . CARDIAC ELECTROPHYSIOLOGY MAPPING AND ABLATION    . CARPAL TUNNEL RELEASE Bilateral   . cervical neck fusion     x 4  . CORONARY ANGIOPLASTY    . CORONARY STENT INTERVENTION N/A 10/17/2017   Procedure: CORONARY STENT INTERVENTION;  Surgeon: Martinique, Peter M, MD;  Location: [facility];  Service: Cardiovascular;  Laterality: N/A;  . CORONARY STENT PLACEMENT     x 2  . CYSTOSCOPY W/ RETROGRADES Bilateral 10/08/2019   Procedure: CYSTOSCOPY WITH BILATERAL RETROGRADE PYELOGRAM;  Surgeon: Cleon Gustin, MD;  Location: [facility];  Service: Urology;  Laterality: Bilateral;  . CYSTOSCOPY W/ URETERAL STENT PLACEMENT Bilateral 01/26/2020   Procedure: CYSTOSCOPY WITH RETROGRADE PYELOGRAM bilateral right ureter stent removal;  Surgeon: Irine Seal, MD;  Location: [facility];  Service: Urology;  Laterality: Bilateral;  . CYSTOSCOPY WITH FULGERATION N/A 01/26/2020   Procedure: CYSTOSCOPY WITH FULGERATION, CLOT EVACUATION transurethral resection bladder tumor;  Surgeon: Irine Seal, MD;  Location: [facility];  Service: Urology;  Laterality: N/A;  . CYSTOSCOPY WITH URETHRAL DILATATION  10/08/2019   Procedure: CYSTOSCOPY WITH URETHRAL DILATATION;  Surgeon: Cleon Gustin, MD;  Location: [facility];  Service: Urology;;  . LEFT HEART CATH AND CORONARY ANGIOGRAPHY N/A 10/17/2017    Procedure: LEFT HEART CATH AND CORONARY ANGIOGRAPHY;  Surgeon: Martinique, Peter M, MD;  Location: [facility];  Service: Cardiovascular;  Laterality: N/A;  . ROTATOR CUFF REPAIR Right    x 4  . TIBIA FRACTURE SURGERY Left   . TRANSURETHRAL RESECTION OF BLADDER TUMOR N/A 10/08/2019   Procedure: TRANSURETHRAL RESECTION OF BLADDER TUMOR (TURBT);  Surgeon: Cleon Gustin, MD;  Location: [facility];  Service: Urology;  Laterality: N/A;    SOCIAL HISTORY:  Social History   Socioeconomic History  . Marital status: Married    Spouse name: Not on file  . Number of children: 4  . Years of education: Not on file  . Highest education level: Not on file  Occupational History  . Occupation: truck Geophysicist/field seismologist    Comment: retired  Tobacco Use  . Smoking status: Current Every Day Smoker    Packs/day: 0.50    Years: 54.00    Pack years: 27.00    Types: Cigarettes  . Smokeless tobacco: Never Used  Vaping Use  . Vaping Use: Never used  Substance and Sexual Activity  . Alcohol use: No    Alcohol/week: 0.0 standard drinks    Comment: rare  . Drug use: No  . Sexual activity: Not Currently  Other Topics Concern  . Not on file  Social History Narrative  . Not on file   Social Determinants of Health   Financial Resource Strain: Low Risk   . Difficulty of Paying Living Expenses: Not hard at all  Food Insecurity: No Food Insecurity  . Worried About Charity fundraiser in the Last Year: Never true  . Ran Out of Food in the Last Year: Never true  Transportation Needs: No Transportation Needs  . Lack of Transportation (Medical): No  . Lack of Transportation (Non-Medical): No  Physical Activity: Inactive  . Days of Exercise per Week: 0 days  . Minutes of Exercise per Session: 0 min  Stress: No Stress Concern Present  . Feeling of Stress : Not at all  Social Connections: Moderately Isolated  . Frequency of Communication with Friends and Family: Three times a week  . Frequency of Social  Gatherings with Friends and Family: Three times a week  . Attends Religious Services: Never  . Active Member of Clubs or Organizations: No  . Attends Archivist Meetings: Never  . Marital Status: Married  Human resources officer Violence: Not At Risk  . Fear of Current or Ex-Partner: No  . Emotionally Abused: No  . Physically Abused: No  . Sexually Abused: No    FAMILY HISTORY:  Family History  Problem Relation Age of Onset  . Diabetes Mother   . Heart disease Mother   . Heart disease Brother   . Lung cancer Brother   . Emphysema Father   . Lung cancer Brother        x 2  . Lupus Daughter   . Colon cancer Neg Hx   . Esophageal cancer Neg Hx   . Rectal cancer Neg  Hx   . Stomach cancer Neg Hx   . Prostate cancer Neg Hx     CURRENT MEDICATIONS:  Current Outpatient Medications  Medication Sig Dispense Refill  . acetaminophen (TYLENOL) 325 MG tablet Take 2 tablets (650 mg total) by mouth every 6 (six) hours as needed for mild pain (or Fever >/= 101). 12 tablet 0  . ALPRAZolam (XANAX) 0.5 MG tablet Take 0.5 mg by mouth 2 (two) times daily as needed for sleep.    [name] amiodarone (PACERONE) 200 MG tablet Take 1 tablet (200 mg total) by mouth daily. 90 tablet 3  . atorvastatin (LIPITOR) 10 MG tablet Take 10 mg by mouth daily.    . B-D ULTRAFINE III SHORT PEN 31G X 8 MM MISC Inject into the skin.    [name] BIDIL 20-37.5 MG tablet Take 1 tablet by mouth 3 (three) times daily.    . carvedilol (COREG) 12.5 MG tablet Take 1 tablet (12.5 mg total) by mouth 2 (two) times daily with a meal. 60 tablet 3  . empagliflozin (JARDIANCE) 10 MG TABS tablet Take 1 tablet (10 mg total) by mouth daily. 30 tablet 3  . glipiZIDE (GLUCOTROL XL) 10 MG 24 hr tablet Take 10 mg by mouth 2 (two) times daily.    . hydrALAZINE (APRESOLINE) 25 MG tablet Take 1 tablet (25 mg total) by mouth 2 (two) times daily. 60 tablet 2  . nitroGLYCERIN (NITROSTAT) 0.4 MG SL tablet Place 1 tablet (0.4 mg total) under the tongue  every 5 (five) minutes as needed for chest pain. 25 tablet 3  . oxyCODONE (OXYCONTIN) 20 mg 12 hr tablet Take 1 tablet (20 mg total) by mouth every 12 (twelve) hours. 30 tablet 0  . Oxycodone HCl 10 MG TABS Take 1 tablet (10 mg total) by mouth 4 (four) times daily as needed (For pain). 84 tablet 0  . pioglitazone (ACTOS) 30 MG tablet Take 30 mg by mouth daily.    . potassium chloride (KLOR-CON) 10 MEQ tablet Take 1 tablet (10 mEq total) by mouth daily. 90 tablet 3  . torsemide (DEMADEX) 20 MG tablet Take 1 tablet (20 mg total) by mouth daily. 90 tablet 3   No current facility-administered medications for this visit.    ALLERGIES:  Allergies  Allergen Reactions  . Statins Other (See Comments)    Leg pain, tolerates lipitor     PHYSICAL EXAM:  Performance status (ECOG): 2 - Symptomatic, <50% confined to bed  Vitals:   05/20/20 1603  BP: (!) 142/52  Pulse: (!) 57  Resp: 17  Temp: 97.9 F (36.6 C)  SpO2: 98%   Wt Readings from Last 3 Encounters:  05/20/20 163 lb 9 oz (74.2 kg)  05/13/20 168 lb (76.2 kg)  05/01/20 170 lb (77.1 kg)   Physical Exam Vitals reviewed.  Constitutional:      Appearance: Normal appearance.     Comments: In wheelchair  Neurological:     General: No focal deficit present.     Mental Status: He is alert and oriented to person, place, and time.  Psychiatric:        Mood and Affect: Mood normal.        Behavior: Behavior normal.      LABORATORY DATA:  I have reviewed the labs as listed.  CBC Latest Ref Rng & Units 04/17/2020 04/15/2020 04/14/2020  WBC 4.0 - 10.5 K/uL 4.8 4.5 4.5  Hemoglobin 13.0 - 17.0 g/dL 9.4(L) 9.8(L) 9.8(L)  Hematocrit 39.0 - 52.0 % 31.5(L)  30.9(L) 31.9(L)  Platelets 150 - 400 K/uL 175 186 183   CMP Latest Ref Rng & Units 05/13/2020 05/09/2020 04/20/2020  Glucose 70 - 99 mg/dL 67(L) 88 132(H)  BUN 8 - 23 mg/dL 54(H) 50(H) 29(H)  Creatinine 0.61 - 1.24 mg/dL 2.21(H) 2.33(H) 1.81(H)  Sodium 135 - 145 mmol/L 136 135 132(L)   Potassium 3.5 - 5.1 mmol/L 3.5 3.5 5.0  Chloride 98 - 111 mmol/L 93(L) 93(L) 93(L)  CO2 22 - 32 mmol/L 32 30 29  Calcium 8.9 - 10.3 mg/dL 9.3 9.0 8.9  Total Protein 6.5 - 8.1 g/dL - - -  Total Bilirubin 0.3 - 1.2 mg/dL - - -  Alkaline Phos 38 - 126 U/L - - -  AST 15 - 41 U/L - - -  ALT 0 - 44 U/L - - -    DIAGNOSTIC IMAGING:  I have independently reviewed the scans and discussed with the patient. NM PET Image Initial (PI) Skull Base To Thigh  Addendum Date: 05/19/2020   ADDENDUM REPORT: 05/19/2020 10:01 ADDENDUM: LEFT/RIGHT correction: FINDINGS: Skeleton:  Large expansile lesion within the LEFT inferior pubic ramus measures 2.9 x 5.2 cm with SUV max equal 11.0. Electronically Signed   By: Suzy Bouchard M.D.   On: 05/19/2020 10:01   Result Date: 05/19/2020 CLINICAL DATA:  Initial treatment strategy for tumor type. EXAM: NUCLEAR MEDICINE PET SKULL BASE TO THIGH TECHNIQUE: 8.9 mCi F-18 FDG was injected intravenously. Full-ring PET imaging was performed from the skull base to thigh after the radiotracer. CT data was obtained and used for attenuation correction and anatomic localization. Fasting blood glucose: 128 mg/dl COMPARISON:  Lumbar spine 03/27/2020, CT 01/25/2020 FINDINGS: Mediastinal blood pool activity: SUV max 1.8 Liver activity: SUV max NA NECK: No hypermetabolic lymph nodes in the neck. Incidental CT findings: none CHEST: Hypermetabolic LEFT hilar lymph node with SUV max equal 6.7. There are multiple small bilateral pulmonary nodules of varying size. The larger nodules have associated metabolic activity. For example RIGHT upper lobe nodule measuring 9 mm with SUV max equal 4.1. Two peripheral nodules at the same level in the RIGHT upper lobe measuring 8 mm each also have metabolic activity. Small RIGHT lower lobe pulmonary nodule measuring 7 mm on image 89 with SUV max equal 1.7. Several smaller LEFT pulmonary nodules also consistent with metastatic disease. No metabolic activity.  Incidental CT findings: none ABDOMEN/PELVIS: No abnormal activity in liver. No hypermetabolic retroperitoneal lymph nodes. There is asymmetric thickening of the LEFT bladder wall. Bladder not well assessed due intense activity radiotracer within the bladder urine. Incidental CT findings: none SKELETON: Large expansile lesion within the RIGHT inferior pubic ramus measures 2.9 x 5.2 cm with SUV max equal 11.0. Two hypermetabolic lesions in the LEFT iliac bone. The larger posterior iliac bone is expansile extending through the cortex measuring 4.9 cm with SUV max equal 8.8. Smaller lesion the iliac wing. Additional smaller lesion in the RIGHT inferior pubic ramus adjacent to the pubic symphysis SUV max equal 9.5. Incidental CT findings: none IMPRESSION: 1. Large expansile intensely hypermetabolic lesions within the pelvis consistent with aggressive metastatic carcinoma. Large lesions within the LEFT inferior pubic ramus and posterior LEFT iliac bone. Smaller lesions in the LEFT iliac wing and RIGHT ischium. 2. Multiple hypermetabolic pulmonary nodules of varying size consistent with widespread pulmonary metastasis. 3. Hypermetabolic LEFT hilar lymph node. 4. No metastatic adenopathy in the abdomen or pelvis. Electronically Signed: By: Suzy Bouchard M.D. On: 04/25/2020 14:09     ASSESSMENT:  1. High-grade muscle invasive bladder cancer: -Presentation to the ER with TIA symptoms and hematuria. -24 pound weight loss in the last 6 months despite having good appetite and eating well. -CTAP on 09/16/2019 shows mass in the left aspect of the anterior bladder dome measuring 2.7 x 2.3 x 1.8 cm.No evidence of lymphadenopathy or metastatic disease in the abdomen or pelvis. Prominent subcentimeter retroperitoneal lymph nodes unchanged from 2007. -Cystoscopy, bilateral retrograde pyelography, transurethral resection of bladder tumor and right JJ ureteral stent placement on 10/08/2019. Cystoscopy showed 5 cm  dome/posterior wall tumor. 1 cm papillary tumor involving the right ureteral orifice. 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 Carcinoma invades muscularis propria. -He met with Dr. Tammi Klippel and discussed the option of chemoradiation. -He also met with Dr. Tresa Moore and discussed surgical options. He is reluctant to consider surgery. -Bone scan on 11/08/2019 was negative for meta stasis. -CT chest on 11/21/2019 shows no suspicious findings for metastatic disease. Small mediastinal lymph nodes likely reactive.  2. Social/family history: -He is a retired Programmer, systems. Current active smoker, 1 pack/day for 57 years. He is seen with his wife who is a retired Therapist, sports at [facility]. -2 brothers had lung cancer and were smokers. Granddaughter died of neuroblastoma at age 40.   PLAN:  1.  Metastatic bladder cancer: -He reports that his pain is getting worse and he is not able to move much. -He was not able to attend radiation therapy because of pain and transportation. -We talked about best supportive care in the form of hospice.  He is agreeable to talk to them. -We will make a referral to [facility].  2. Poorly controlled diabetes: -Continue glipizide and Metformin.  3. Neuropathy: -Occasional numbness in the toes stable.  4. CHF: -Continue Lasix daily.  5.  Left low back pain: -I have started him on OxyContin 20 mg every 12 hours.  He is requiring oxycodone 10 mg for breakthrough pain up to 3 pills/day. -Recommend changing OxyContin 20 mg to every 8 hours.  Apparently he is having insurance coverage problems.  We will reach out to his insurance and obtain preferred medication list.  6.  Anxiety: -He is taking half tablet of Xanax 0.5 mg as needed.   Orders placed this encounter:  No orders of the defined types were placed in this encounter.    Derek Jack, MD [place] [phone]   I, Milinda Antis, am acting as a scribe for Dr. Sanda Linger.  I, Derek Jack MD, have reviewed the above documentation for accuracy and completeness, and I agree with the above.

## 2020-05-20 NOTE — Patient Instructions (Signed)
[place] at [facility] Discharge Instructions  You were seen today by Dr. Delton Coombes. He went over your recent results. Start taking Oxycontin 20 mg every 8 hours and take oxycodone only as needed for breakthrough pain. You will be referred to [facility] for introduction. Dr. Delton Coombes will call you in 1 month for follow up.   Thank you for choosing [place] at [facility] to provide your oncology and hematology care.  To afford each patient quality time with our provider, please arrive at least 15 minutes before your scheduled appointment time.   If you have a lab appointment with the [facility] please come in thru the Main Entrance and check in at the main information desk  You need to re-schedule your appointment should you arrive 10 or more minutes late.  We strive to give you quality time with our providers, and arriving late affects you and other patients whose appointments are after yours.  Also, if you no show three or more times for appointments you may be dismissed from the clinic at the providers discretion.     Again, thank you for choosing [facility].  Our hope is that these requests will decrease the amount of time that you wait before being seen by our physicians.       _____________________________________________________________  Should you have questions after your visit to [facility], please contact our office at [phone] between the hours of 8:00 a.m. and 4:30 p.m.  Voicemails left after 4:00 p.m. will not be returned until the following business day.  For prescription refill requests, have your pharmacy contact our office and allow 72 hours.    Cancer Center Support Programs:   > Cancer Support Group  2nd Tuesday of the month 1pm-2pm, Journey Room

## 2020-05-21 ENCOUNTER — Ambulatory Visit: Payer: Medicare Other | Admitting: Radiation Oncology

## 2020-05-21 ENCOUNTER — Ambulatory Visit: Payer: Medicare Other

## 2020-05-22 ENCOUNTER — Ambulatory Visit: Payer: Medicare Other

## 2020-05-23 ENCOUNTER — Ambulatory Visit: Payer: Medicare Other

## 2020-05-26 ENCOUNTER — Ambulatory Visit: Payer: Medicare Other

## 2020-05-26 ENCOUNTER — Telehealth: Payer: Self-pay | Admitting: Cardiology

## 2020-05-26 ENCOUNTER — Telehealth: Payer: Self-pay | Admitting: Cardiovascular Disease

## 2020-05-26 MED ORDER — AMIODARONE HCL 200 MG PO TABS
200.0000 mg | ORAL_TABLET | Freq: Every day | ORAL | 3 refills | Status: AC
Start: 2020-05-26 — End: ?

## 2020-05-26 MED ORDER — AMIODARONE HCL 200 MG PO TABS: 200.0000 mg | ORAL_TABLET | Freq: Every day | ORAL | 3 refills | Status: DC

## 2020-05-26 MED ORDER — TORSEMIDE 20 MG PO TABS: 20.0000 mg | ORAL_TABLET | Freq: Every day | ORAL | 3 refills | Status: AC

## 2020-05-26 NOTE — Telephone Encounter (Signed)
*  STAT* If patient is at the pharmacy, call can be transferred to refill team.   1. Which medications need to be refilled? (please list name of each medication and dose if known) amiodarone (PACERONE) 200 MG tablet  2. Which pharmacy/location (including street and city if local pharmacy) is medication to be sent to?  [facility]  3. Do they need a 30 day or 90 day supply? [place]

## 2020-05-26 NOTE — Telephone Encounter (Signed)
New message    [facility] [place] - He is out of medication   amiodarone (PACERONE) 200 MG tablet needs to be resent - because this one says Print   torsemide (DEMADEX) 20 MG tablet Take 1 tablet (20 mg total) by mouth daily.

## 2020-05-26 NOTE — Telephone Encounter (Signed)
Medication refill request for Amiodarone 200 mg tablets approved and sent to [facility] in [place].

## 2020-05-27 ENCOUNTER — Ambulatory Visit: Payer: Medicare Other

## 2020-05-28 ENCOUNTER — Ambulatory Visit: Payer: Medicare Other

## 2020-05-29 ENCOUNTER — Ambulatory Visit: Payer: Medicare Other

## 2020-05-30 ENCOUNTER — Ambulatory Visit: Payer: Medicare Other

## 2020-05-30 ENCOUNTER — Telehealth: Payer: Self-pay | Admitting: Cardiology

## 2020-05-30 NOTE — Telephone Encounter (Signed)
Received telephone call from Hospice.  They need to speak with someone regarding lab orders for patient.  Richard [name]   [phone]

## 2020-05-30 NOTE — Telephone Encounter (Signed)
Spoke to [place] with Hospice who will draw patient's blood for BMET and bring it to [facility] to be run before his appointment with Gerrianne Scale, PA-C on 06/16/20.

## 2020-06-02 ENCOUNTER — Ambulatory Visit: Payer: Medicare Other

## 2020-06-03 ENCOUNTER — Ambulatory Visit: Payer: Medicare Other

## 2020-06-09 ENCOUNTER — Other Ambulatory Visit (HOSPITAL_COMMUNITY)
Admission: RE | Admit: 2020-06-09 | Discharge: 2020-06-09 | Disposition: A | Discharge status: Home / Self Care | Payer: Medicare Other | Source: Ambulatory Visit | Attending: Cardiology | Admitting: Cardiology

## 2020-06-09 NOTE — Progress Notes (Deleted)
Cardiology Office Note    Date:  06/09/2020   ID:  ABSHIR PAOLINI, DOB 09-23-1945, MRN [id number]   PCP:  Wannetta Sender, Time  Cardiologist:  Rozann Lesches, MD *** Advanced Practice Provider:  No care team member to display Electrophysiologist:  None   [phone]   No chief complaint on file.   History of Present Illness:  Richard [name] is a 75 y.o. male with history CAD status post DES x2 to the proximal mid RCA 2007, DES to the mid LAD and OM 2 09/2017 patent stents in the RCA at that time.  Off all antiplatelet therapy and is a poor candidate for PCI because we cannot safely use DAPT.  Ischemic cardiomyopathy LVEF 30 to 35%, bladder CA, also has hypertension, history of atrial flutter status post ablation in 2008, HLD statin intolerant, TIA, DM type II, history of AKI. Monitor 01/24/20 showed Atrial fib per notes. Echo 01/24/20 EF 30-35% mild MR AV sclerosis started on amiodarone was to taper from loading dose 400 bid to 200 mg daily Not anticoagulated due to hematuria and bladder cancer Also on Toprol 100 gm daily Not on ACE/ARB/ARNI due to renal failure DAPT stopped for CAD due to hematuria and no ischemic evaluation Planned due to active Rx of bladder cancer    Discharged 03/23/20 Afib with RVR and acute on chronic CHF. Rate controlled with Amio and Toprol. CHADSVASC=6 but not a candidate of CV or anticoag due to hematuria from bladder CA.   Patient saw [name] 03/11/20 with 8 lb weight gain and fluid overload. Refused admission because he had his last radiation treatment that Monday. was on demadex 40 bid. He was admitted 04/15/20 and diuresed. Sent home 04/20/20 on jardiance but not entresto b/c of rising renal function.  Torsemide later decreased to once daily by Dr. Marlou Porch because of creatinine of 2.33.  I last saw the patient 05/13/2020 who is going to start radiation for metastatic disease and pain control.  Aspirin was stopped by  urologist.  He had lost 16 pounds.  He had not gotten the message to hold his evening Demadex.  Creatinine was 2.21.  I held the [facility] and potassium for several days and then restarted Demadex 20 mg daily and potassium 10 mill equivalents daily to take an extra for weight gain of 2 or 3 pounds overnight.     Past Medical History:  Diagnosis Date  . Anemia 12/2019  . Bladder cancer ([place])   . CAD (coronary artery disease)    a. 2007 - DES x 2 proximal to mid RCA b. 09/2017: DES to mid LAD and OM2. Patent stents along RCA.   [name] CHF (congestive heart failure) ([place])   . Essential hypertension   . History of atrial flutter    Status post ablation 2008 - Dr. Caryl Comes  . History of transient ischemic attack (TIA)   . Mixed hyperlipidemia    Statin intolerance  . Pneumonia   . Type 2 diabetes mellitus (HCC)    A1C 11.5 06/2017    Past Surgical History:  Procedure Laterality Date  . BALLOON ANGIOPLASTY, ARTERY    . BICEPS TENDON REPAIR Left   . CARDIAC ELECTROPHYSIOLOGY MAPPING AND ABLATION    . CARPAL TUNNEL RELEASE Bilateral   . cervical neck fusion     x 4  . CORONARY ANGIOPLASTY    . CORONARY STENT INTERVENTION N/A 10/17/2017   Procedure: CORONARY STENT INTERVENTION;  Surgeon: Martinique, Peter M, MD;  Location: [place] [facility];  Service: Cardiovascular;  Laterality: N/A;  . CORONARY STENT PLACEMENT     x 2  . CYSTOSCOPY W/ RETROGRADES Bilateral 10/08/2019   Procedure: CYSTOSCOPY WITH BILATERAL RETROGRADE PYELOGRAM;  Surgeon: Cleon Gustin, MD;  Location: [facility];  Service: Urology;  Laterality: Bilateral;  . CYSTOSCOPY W/ URETERAL STENT PLACEMENT Bilateral 01/26/2020   Procedure: CYSTOSCOPY WITH RETROGRADE PYELOGRAM bilateral right ureter stent removal;  Surgeon: Irine Seal, MD;  Location: [facility];  Service: Urology;  Laterality: Bilateral;  . CYSTOSCOPY WITH FULGERATION N/A 01/26/2020   Procedure: CYSTOSCOPY WITH FULGERATION, CLOT EVACUATION transurethral resection bladder tumor;   Surgeon: Irine Seal, MD;  Location: [facility];  Service: Urology;  Laterality: N/A;  . CYSTOSCOPY WITH URETHRAL DILATATION  10/08/2019   Procedure: CYSTOSCOPY WITH URETHRAL DILATATION;  Surgeon: Cleon Gustin, MD;  Location: [facility];  Service: Urology;;  . LEFT HEART CATH AND CORONARY ANGIOGRAPHY N/A 10/17/2017   Procedure: LEFT HEART CATH AND CORONARY ANGIOGRAPHY;  Surgeon: Martinique, Peter M, MD;  Location: [place] [facility];  Service: Cardiovascular;  Laterality: N/A;  . ROTATOR CUFF REPAIR Right    x 4  . TIBIA FRACTURE SURGERY Left   . TRANSURETHRAL RESECTION OF BLADDER TUMOR N/A 10/08/2019   Procedure: TRANSURETHRAL RESECTION OF BLADDER TUMOR (TURBT);  Surgeon: Cleon Gustin, MD;  Location: [facility];  Service: Urology;  Laterality: N/A;    Current Medications: No outpatient medications have been marked as taking for the 06/16/20 encounter (Appointment) with Imogene Burn, PA-C.     Allergies:   Statins   Social History   Socioeconomic History  . Marital status: Married    Spouse name: Not on file  . Number of children: 4  . Years of education: Not on file  . Highest education level: Not on file  Occupational History  . Occupation: truck Geophysicist/field seismologist    Comment: retired  Tobacco Use  . Smoking status: Current Every Day Smoker    Packs/day: 0.50    Years: 54.00    Pack years: 27.00    Types: Cigarettes  . Smokeless tobacco: Never Used  Vaping Use  . Vaping Use: Never used  Substance and Sexual Activity  . Alcohol use: No    Alcohol/week: 0.0 standard drinks    Comment: rare  . Drug use: No  . Sexual activity: Not Currently  Other Topics Concern  . Not on file  Social History Narrative  . Not on file   Social Determinants of Health   Financial Resource Strain: Low Risk   . Difficulty of Paying Living Expenses: Not hard at all  Food Insecurity: No Food Insecurity  . Worried About Charity fundraiser in the Last Year: Never true  . Ran Out of Food in the Last  Year: Never true  Transportation Needs: No Transportation Needs  . Lack of Transportation (Medical): No  . Lack of Transportation (Non-Medical): No  Physical Activity: Inactive  . Days of Exercise per Week: 0 days  . Minutes of Exercise per Session: 0 min  Stress: No Stress Concern Present  . Feeling of Stress : Not at all  Social Connections: Moderately Isolated  . Frequency of Communication with Friends and Family: Three times a week  . Frequency of Social Gatherings with Friends and Family: Three times a week  . Attends Religious Services: Never  . Active Member of Clubs or Organizations: No  . Attends Archivist  Meetings: Never  . Marital Status: Married     Family History:  The patient's ***family history includes Diabetes in his mother; Emphysema in his father; Heart disease in his brother and mother; Lung cancer in his brother and brother; Lupus in his daughter.   ROS:   Please see the history of present illness.    ROS All other systems reviewed and are negative.   PHYSICAL EXAM:   VS:  There were no vitals taken for this visit.  Physical Exam  GEN: Well nourished, well developed, in no acute distress  HEENT: normal  Neck: no JVD, carotid bruits, or masses Cardiac:RRR; no murmurs, rubs, or gallops  Respiratory:  clear to auscultation bilaterally, normal work of breathing GI: soft, nontender, nondistended, + BS Ext: without cyanosis, clubbing, or edema, Good distal pulses bilaterally MS: no deformity or atrophy  Skin: warm and dry, no rash Neuro:  Alert and Oriented x 3, Strength and sensation are intact Psych: euthymic mood, full affect  Wt Readings from Last 3 Encounters:  05/20/20 163 lb 9 oz (74.2 kg)  05/13/20 168 lb (76.2 kg)  05/01/20 170 lb (77.1 kg)      Studies/Labs Reviewed:   EKG:  EKG is*** ordered today.  The ekg ordered today demonstrates ***  Recent Labs: 04/14/2020: TSH 6.904 04/15/2020: ALT 35 04/17/2020: B Natriuretic Peptide  1,536.0; Hemoglobin 9.4; Platelets 175 04/20/2020: Magnesium 1.9 05/13/2020: BUN 54; Creatinine, Ser 2.21; Potassium 3.5; Sodium 136   Lipid Panel    Component Value Date/Time   CHOL 157 09/16/2019 0614   TRIG 121 09/16/2019 0614   HDL 39 (L) 09/16/2019 0614   CHOLHDL 4.0 09/16/2019 0614   VLDL 24 09/16/2019 0614   LDLCALC 94 09/16/2019 0614    Additional studies/ records that were reviewed today include:  Echocardiogram 01/24/2020:  1. Left ventricular ejection fraction, by estimation, is 30 to 35%. The  left ventricle has moderately decreased function. The left ventricle  demonstrates global hypokinesis. The left ventricular internal cavity size  was mildly dilated. Left ventricular  diastolic parameters are consistent with Grade I diastolic dysfunction  (impaired relaxation). Elevated left atrial pressure.   2. Right ventricular systolic function is normal. The right ventricular  size is normal.   3. The mitral valve is normal in structure. Mild mitral valve  regurgitation. No evidence of mitral stenosis.   4. The aortic valve is tricuspid. Aortic valve regurgitation is not  visualized. Mild aortic valve sclerosis is present, with no evidence of  aortic valve stenosis.   5. The inferior vena cava is normal in size with greater than 50%  respiratory variability, suggesting right atrial pressure of 3 mmHg.               Risk Assessment/Calculations:   {Does this patient have ATRIAL FIBRILLATION?[phone]}     ASSESSMENT:    1. Chronic systolic CHF (congestive heart failure) (HCC)   2. Paroxysmal atrial fibrillation (HCC)   3. Bladder mass   4. Stage 3 chronic kidney disease, unspecified whether stage 3a or 3b CKD ([place])      PLAN:  In order of problems listed above:  Chronic systolic CHF LVEF 30 to 99% on echo 01/2020  with admission in February with CHF.  Has lost 16 pounds since 04/22/20.  Creatinine up to 2.21.  I held Demadex and potassium  for several days  and started him back 20 mg daily.  Afib with RVR-previous ablation 2008, not candidate for anticoagulation and CHADSVASC=5. Aim  for rate control rate controlled on Amio and coreg-rate is controlled today.   CAD with stents listed above.  Has been off antiplatelet therapy and is a poor candidate for PCI since he cannot take DAPT.  Angina currently being treated with Norvasc Imdur Lipitor and Toprol-no chest pain today.  Aspirin stopped by her urologist because of hematuria   Bladder CA with hematuria status post resection and clot evacuation  finished radiation  but PET scan indicates metastatic disease and he has received 10 rounds of radiation  to reduce tumor size and for pain control.   CKD stage 3  creatinine 2.21 05/13/2020.           Shared Decision Making/Informed Consent   {Are you ordering a CV Procedure (e.g. stress test, cath, DCCV, TEE, etc)?   Press F2        :[id number]}    Medication Adjustments/Labs and Tests Ordered: Current medicines are reviewed at length with the patient today.  Concerns regarding medicines are outlined above.  Medication changes, Labs and Tests ordered today are listed in the Patient Instructions below. There are no Patient Instructions on file for this visit.   Sumner Boast, PA-C  06/09/2020 12:30 PM    [facility] [address], [place], [place]  [address] Phone: [phone]; Fax: [phone]

## 2020-06-11 ENCOUNTER — Telehealth: Payer: Self-pay

## 2020-06-11 ENCOUNTER — Other Ambulatory Visit (HOSPITAL_COMMUNITY)
Admission: RE | Admit: 2020-06-11 | Discharge: 2020-06-11 | Disposition: A | Discharge status: Home / Self Care | Source: Other Acute Inpatient Hospital | Attending: Cardiology | Admitting: Cardiology

## 2020-06-11 DIAGNOSIS — I509 Heart failure, unspecified: Secondary | ICD-10-CM | POA: Insufficient documentation

## 2020-06-11 LAB — BASIC METABOLIC PANEL
Anion gap: 11 (ref 5–15)
BUN: 59 mg/dL — ABNORMAL HIGH (ref 8–23)
CO2: 30 mmol/L (ref 22–32)
Calcium: 9.4 mg/dL (ref 8.9–10.3)
Chloride: 96 mmol/L — ABNORMAL LOW (ref 98–111)
Creatinine, Ser: 2.55 mg/dL — ABNORMAL HIGH (ref 0.61–1.24)
GFR, Estimated: 26 mL/min — ABNORMAL LOW (ref >60)
Glucose, Bld: 156 mg/dL — ABNORMAL HIGH (ref 70–99)
Potassium: 3.7 mmol/L (ref 3.5–5.1)
Sodium: 137 mmol/L (ref 135–145)

## 2020-06-11 MED ORDER — CARVEDILOL 12.5 MG PO TABS: 12.5000 mg | ORAL_TABLET | Freq: Two times a day (BID) | ORAL | 3 refills | Status: AC

## 2020-06-11 NOTE — Telephone Encounter (Signed)
Patient caretaker (spouse) notified and verbalized understanding. Aware to stop [place]

## 2020-06-11 NOTE — Telephone Encounter (Signed)
-----   Message from Satira Sark, MD sent at 06/11/2020 12:25 PM EDT ----- Results reviewed.  Lab work ordered in anticipation of office follow-up with Ms. Richard [name].  Potassium was normal at 3.7, BUN 59 and creatinine has increased to 2.55 from 2.21.  Rather than holding current diuretics, would reassess volume status at office visit first.  He is under hospice care with other significant comorbidities and may need further medication adjustments.  I would have him stop Jardiance if he is still taking it.

## 2020-06-16 ENCOUNTER — Ambulatory Visit: Payer: Medicare Other | Admitting: Physician Assistant

## 2020-06-16 ENCOUNTER — Telehealth: Payer: Self-pay | Admitting: Cardiology

## 2020-06-16 DIAGNOSIS — N3289 Other specified disorders of bladder: Secondary | ICD-10-CM

## 2020-06-16 DIAGNOSIS — I480 Paroxysmal atrial fibrillation: Secondary | ICD-10-CM

## 2020-06-16 DIAGNOSIS — I5022 Chronic systolic (congestive) heart failure: Secondary | ICD-10-CM

## 2020-06-16 DIAGNOSIS — N183 Chronic kidney disease, stage 3 unspecified: Secondary | ICD-10-CM

## 2020-06-16 NOTE — Telephone Encounter (Signed)
Thank you for letting me know

## 2020-06-16 NOTE — Telephone Encounter (Signed)
Patient's wife called said to let [name] and Ermalinda Barrios know he is under the care of Hospice and too weak to make it to appointments. They will continue his heart meds, he is on Fentanyl Patch 62mcq and Oxycodone 15mg  3x a day to keep him comfortable.

## 2020-06-16 NOTE — Telephone Encounter (Signed)
Noted. Routed to [name] and Gerrianne Scale, PA-C as Juluis Rainier.

## 2020-06-23 ENCOUNTER — Encounter (HOSPITAL_COMMUNITY): Payer: Self-pay | Admitting: Hematology

## 2020-06-23 ENCOUNTER — Inpatient Hospital Stay (HOSPITAL_COMMUNITY): Payer: Medicare Other | Attending: Hematology | Admitting: Hematology

## 2020-06-23 ENCOUNTER — Other Ambulatory Visit: Payer: Self-pay

## 2020-06-23 DIAGNOSIS — C7951 Secondary malignant neoplasm of bone: Secondary | ICD-10-CM | POA: Diagnosis not present

## 2020-06-23 DIAGNOSIS — G893 Neoplasm related pain (acute) (chronic): Secondary | ICD-10-CM | POA: Diagnosis not present

## 2020-06-23 DIAGNOSIS — C7801 Secondary malignant neoplasm of right lung: Secondary | ICD-10-CM

## 2020-06-23 DIAGNOSIS — C7802 Secondary malignant neoplasm of left lung: Secondary | ICD-10-CM | POA: Diagnosis not present

## 2020-06-23 DIAGNOSIS — C678 Malignant neoplasm of overlapping sites of bladder: Secondary | ICD-10-CM

## 2020-06-23 NOTE — Progress Notes (Signed)
Virtual Visit via Telephone Note  I connected with [name] on 06/23/20 at  4:15 PM EDT by telephone and verified that I am speaking with the correct person using two identifiers.  Location: Patient: At home Provider: In office   I discussed the limitations, risks, security and privacy concerns of performing an evaluation and management service by telephone and the availability of in person appointments. I also discussed with the patient that there may be a patient responsible charge related to this service. The patient expressed understanding and agreed to proceed.   History of Present Illness: He was seen in our office for metastatic bladder cancer.  He did not receive any systemic therapy.   Observations/Objective: I have talked to his wife over the phone.  Patient is currently in hospice.  She reports that he is confused on and off from his medication.  He is confined to bed at this time.  Eating only soft foods.  Assessment and Plan:  1.  Cancer related low back pain: - His fentanyl was increased to 100 mcg daily today.  Oxycodone was increased to 20 mg every 3 hours which is controlling his pain adequately. - Continue Xanax as needed. - Further management per hospice team.   Follow Up Instructions: Not given.   I discussed the assessment and treatment plan with the patient. The patient was provided an opportunity to ask questions and all were answered. The patient agreed with the plan and demonstrated an understanding of the instructions.   The patient was advised to call back or seek an in-person evaluation if the symptoms worsen or if the condition fails to improve as anticipated.  I provided 11 minutes of non-face-to-face time during this encounter.   Derek Jack, MD

## 2020-06-26 ENCOUNTER — Encounter: Payer: Self-pay | Admitting: Radiation Oncology

## 2020-06-26 NOTE — Progress Notes (Signed)
  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  However, his condition continued to deteriorate and he elected to forgo radiation and enroll in hospice.  ________________________________  Sheral Apley. Tammi Klippel, M.D.

## 2020-07-23 DEATH — deceased

## 2020-07-31 ENCOUNTER — Ambulatory Visit: Payer: Medicare Other | Admitting: Urology

## 2021-09-03 IMAGING — DX DG CHEST 2V
2 series · 2 of 2 positions shown · non-contrast
Comparison: 01/25/2020

CLINICAL DATA: Shortness of breath, lower extremity swelling,
history diabetes mellitus, CHF, coronary artery disease, smoker,
bladder cancer; 6VS30-D2 negative on 03/19/2020

EXAM:
CHEST - 2 VIEW

[chest pa]
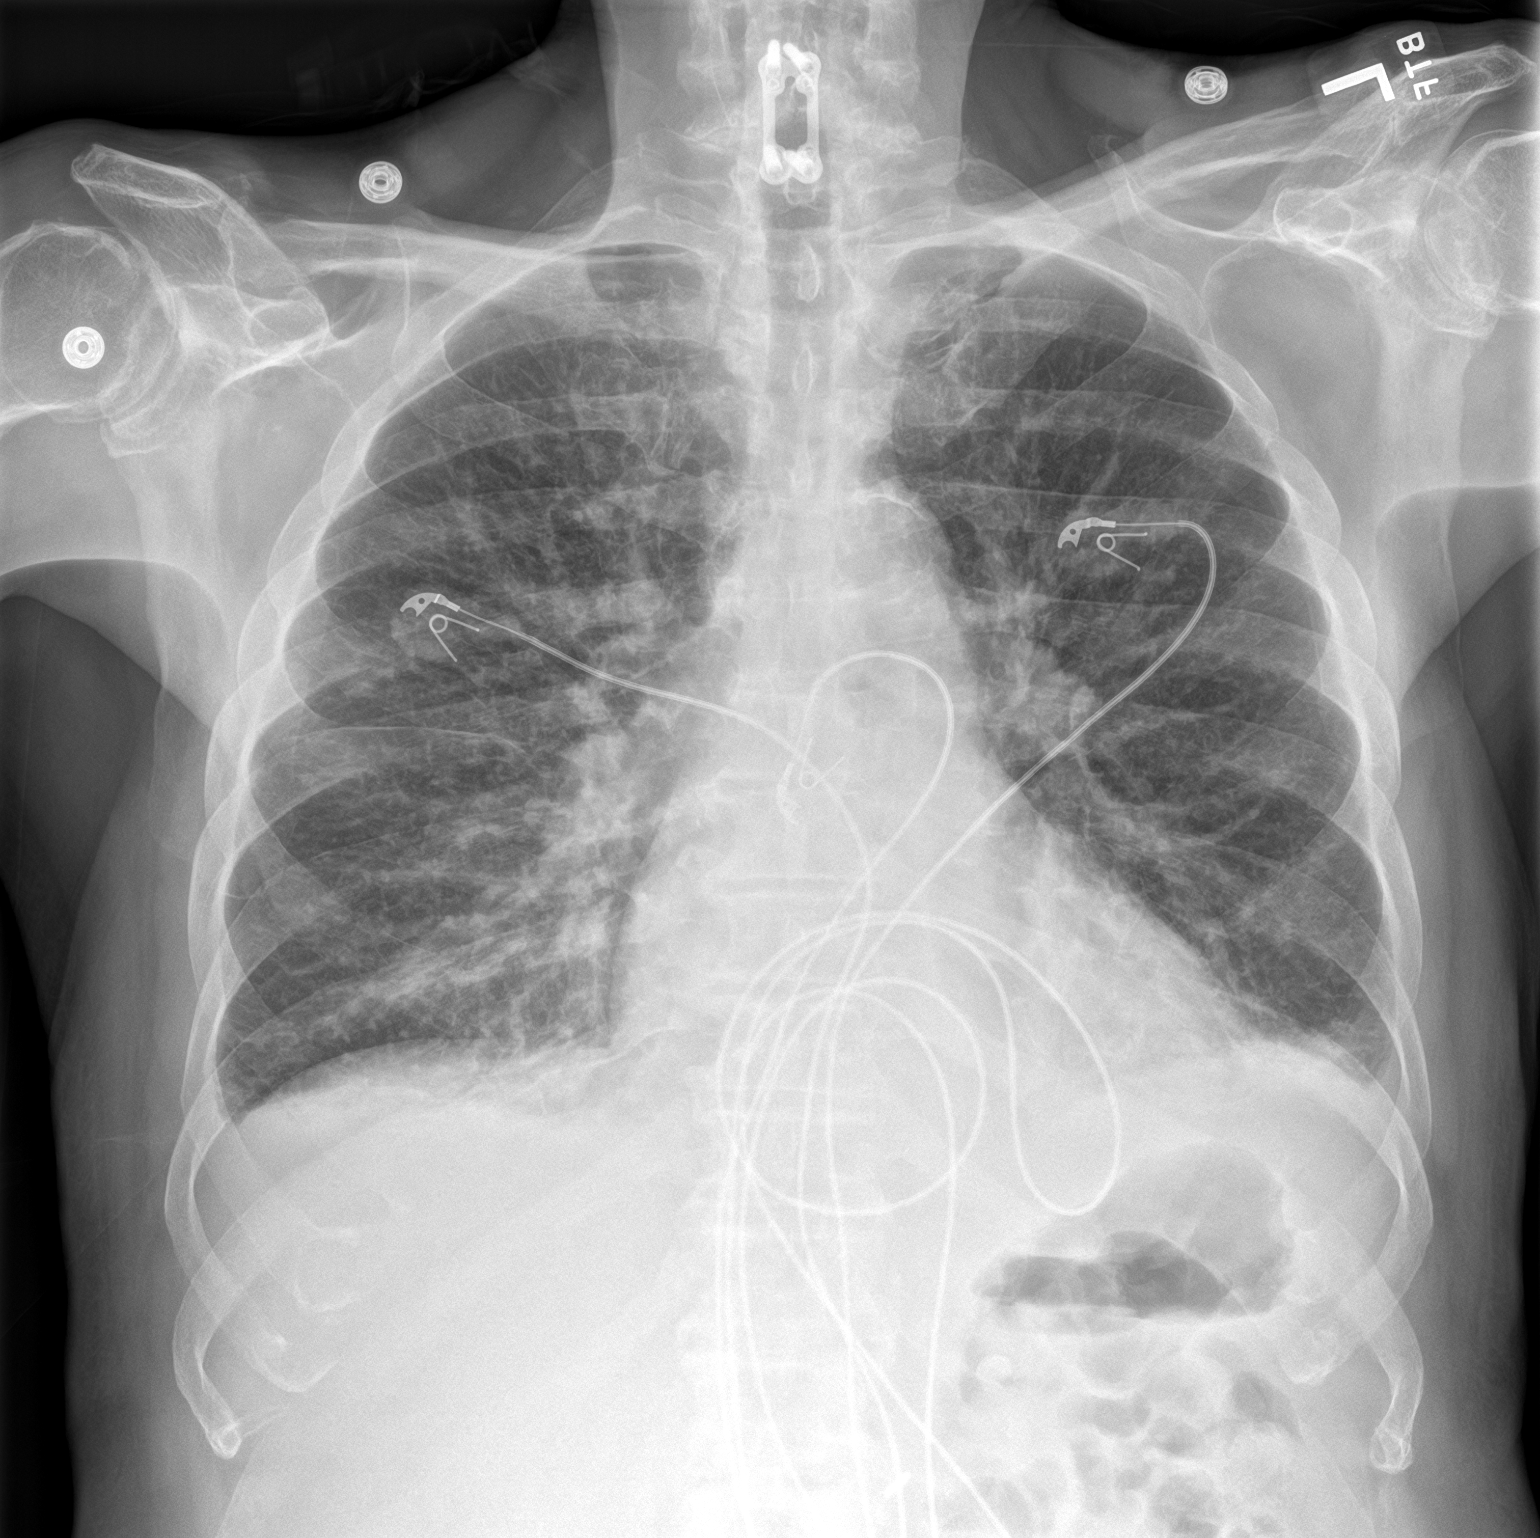

[chest lat]
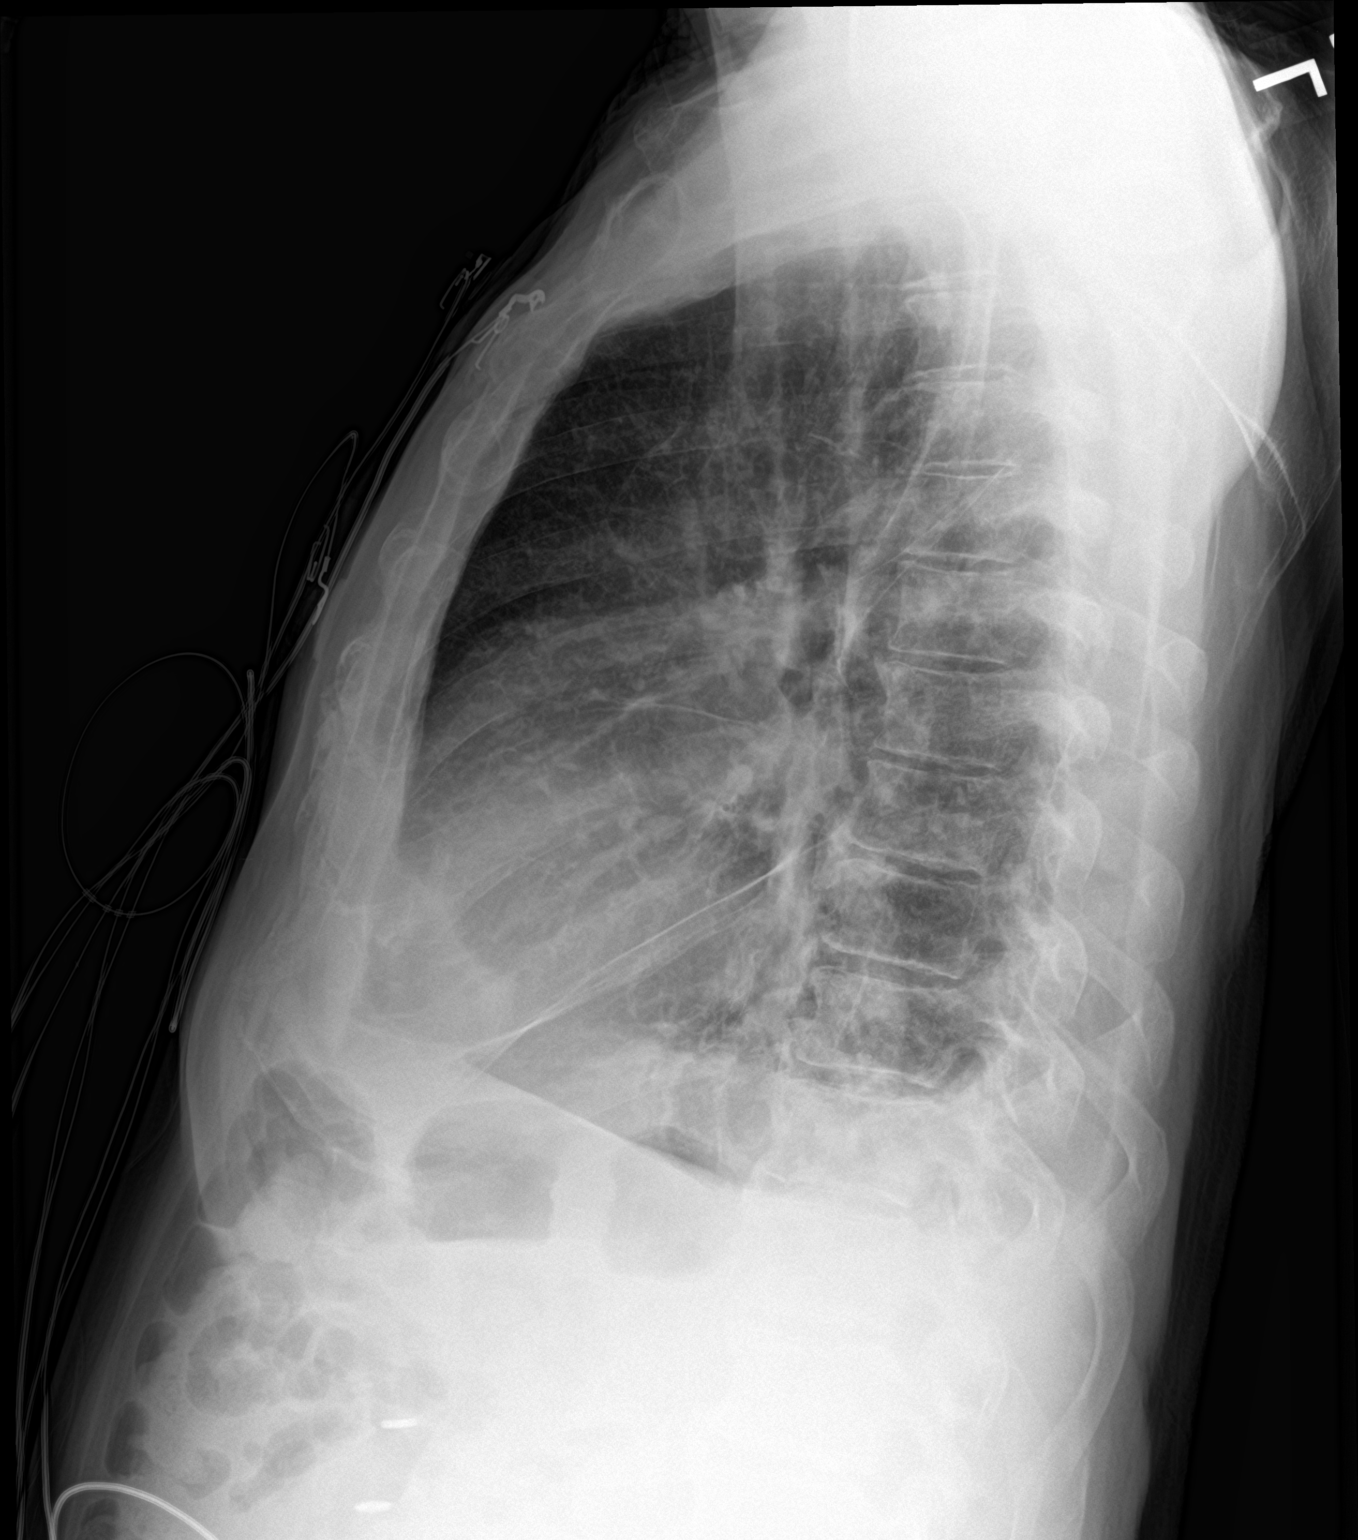

[2 of 2 positions shown; findings below may reference images not displayed]

FINDINGS: Upper normal heart size.

Slight pulmonary vascular congestion.

Atherosclerotic calcification aorta.

Increased interstitial markings throughout both lung since previous
exam, question pulmonary edema, though atypical infection not
completely excluded.

Tiny bibasilar effusions.

No pneumothorax.

Bones demineralized with BILATERAL glenohumeral degenerative changes
and evidence of prior cervical spine fusion.
IMPRESSION: Diffuse interstitial infiltrates bilaterally favor pulmonary edema
as above.

Tiny bibasilar pleural effusions.

## 2021-10-07 IMAGING — PT NM PET TUM IMG INITIAL (PI) SKULL BASE T - THIGH
1 series · 8 of 8 positions shown · non-contrast
Comparison: Lumbar spine 03/27/2020, CT 01/25/2020
COMPARISON: Lumbar spine 03/27/2020, CT 01/25/2020

Addendum:
CLINICAL DATA: Initial treatment strategy for tumor type.

EXAM:
NUCLEAR MEDICINE PET SKULL BASE TO THIGH
TECHNIQUE: 8.9 mCi F-18 FDG was injected intravenously. Full-ring PET imaging
was performed from the skull base to thigh after the radiotracer. CT
data was obtained and used for attenuation correction and anatomic
localization.
Fasting blood glucose: 128 mg/dl

[Series 1086: results mm oncology reading · 1.0mm · 0.89mm/px · 8 of 8 slices shown]
[im 1/8]
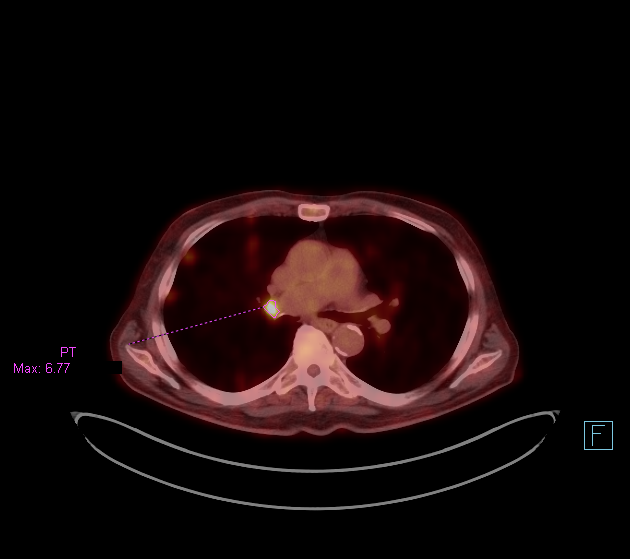
[im 2/8]
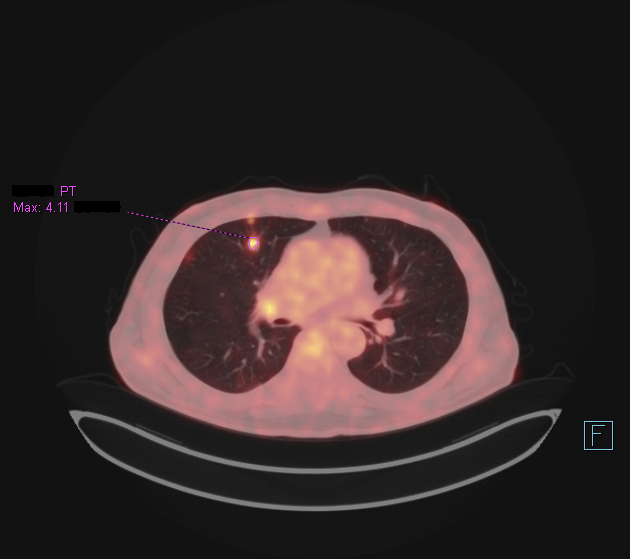
[im 3/8]
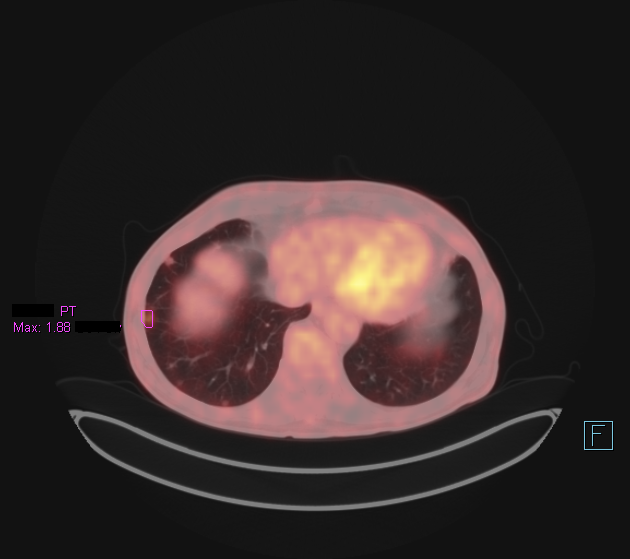
[im 4/8]
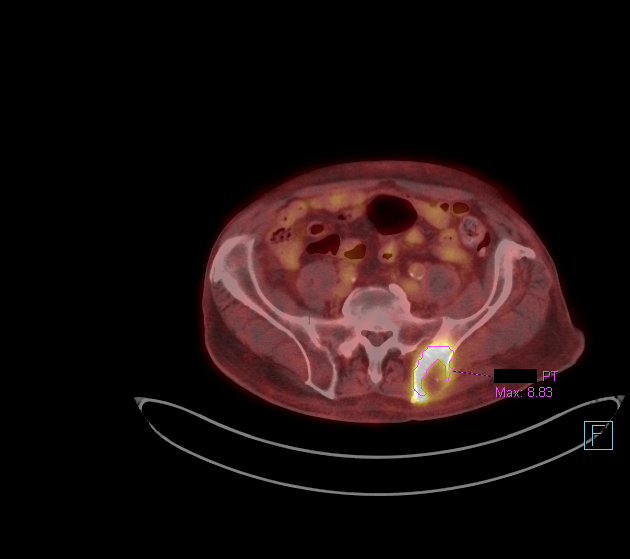
[im 5/8]
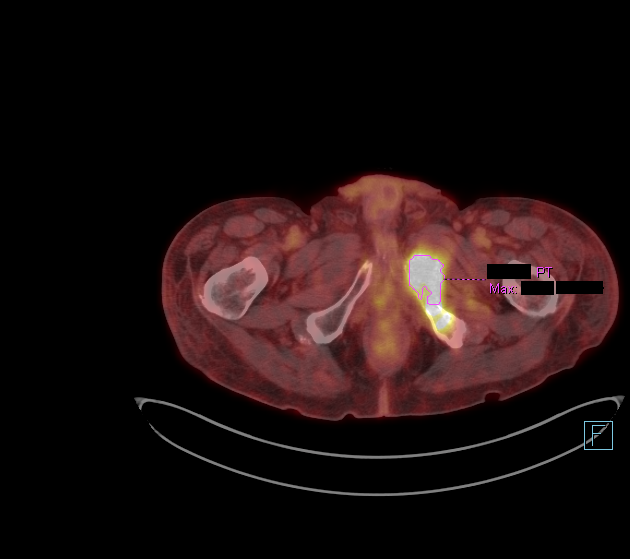
[im 6/8]
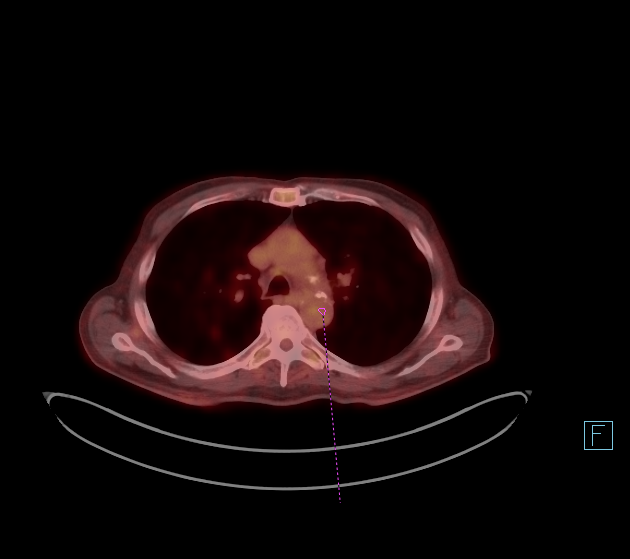
[im 7/8]
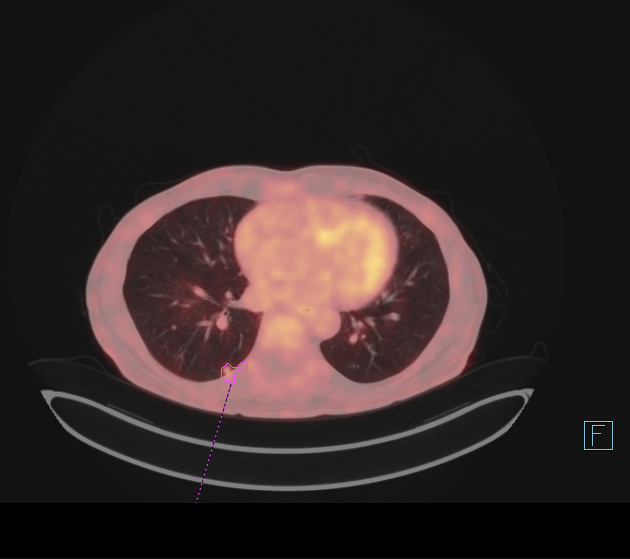
[im 8/8]
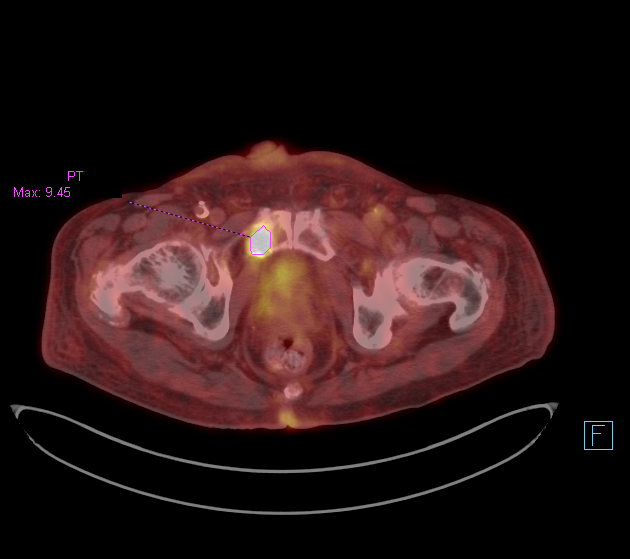

[8 of 8 positions shown; findings below may reference images not displayed]

FINDINGS: Mediastinal blood pool activity: SUV max

Liver activity: SUV max NA

NECK: No hypermetabolic lymph nodes in the neck.

Incidental CT findings: none

CHEST: Hypermetabolic LEFT hilar lymph node with SUV max equal 6.7.

There are multiple small bilateral pulmonary nodules of varying
size. The larger nodules have associated metabolic activity. For
example RIGHT upper lobe nodule measuring 9 mm with SUV max equal
4.1. Two peripheral nodules at the same level in the RIGHT upper
lobe measuring 8 mm each also have metabolic activity. Small RIGHT
lower lobe pulmonary nodule measuring 7 mm on image 89 with SUV max
equal 1.7.

Several smaller LEFT pulmonary nodules also consistent with
metastatic disease. No metabolic activity.

Incidental CT findings: none

ABDOMEN/PELVIS: No abnormal activity in liver. No hypermetabolic
retroperitoneal lymph nodes. There is asymmetric thickening of the
LEFT bladder wall. Bladder not well assessed due intense activity
radiotracer within the bladder urine.

Incidental CT findings: none

SKELETON: Large expansile lesion within the RIGHT inferior pubic
ramus measures 2.9 x 5.2 cm with SUV max equal 11.0.

Two hypermetabolic lesions in the LEFT iliac bone. The larger
posterior iliac bone is expansile extending through the cortex
measuring 4.9 cm with SUV max equal 8.8.

Smaller lesion the iliac wing.

Additional smaller lesion in the RIGHT inferior pubic ramus adjacent
to the pubic symphysis SUV max equal 9.5.

Incidental CT findings: none
IMPRESSION: 1. Large expansile intensely hypermetabolic lesions within the
pelvis consistent with aggressive metastatic carcinoma. Large
lesions within the LEFT inferior pubic ramus and posterior LEFT
iliac bone. Smaller lesions in the LEFT iliac wing and RIGHT
ischium.
2. Multiple hypermetabolic pulmonary nodules of varying size
consistent with widespread pulmonary metastasis.
3. Hypermetabolic LEFT hilar lymph node.
4. No metastatic adenopathy in the abdomen or pelvis.

ADDENDUM:
LEFT/RIGHT correction:
FINDINGS: Skeleton:  Large expansile lesion within the LEFT inferior pubic
ramus measures 2.9 x 5.2 cm with SUV max equal 11.0.

*** End of Addendum ***
FINDINGS: Mediastinal blood pool activity: SUV max

Liver activity: SUV max NA

NECK: No hypermetabolic lymph nodes in the neck.

Incidental CT findings: none

CHEST: Hypermetabolic LEFT hilar lymph node with SUV max equal 6.7.

There are multiple small bilateral pulmonary nodules of varying
size. The larger nodules have associated metabolic activity. For
example RIGHT upper lobe nodule measuring 9 mm with SUV max equal
4.1. Two peripheral nodules at the same level in the RIGHT upper
lobe measuring 8 mm each also have metabolic activity. Small RIGHT
lower lobe pulmonary nodule measuring 7 mm on image 89 with SUV max
equal 1.7.

Several smaller LEFT pulmonary nodules also consistent with
metastatic disease. No metabolic activity.

Incidental CT findings: none

ABDOMEN/PELVIS: No abnormal activity in liver. No hypermetabolic
retroperitoneal lymph nodes. There is asymmetric thickening of the
LEFT bladder wall. Bladder not well assessed due intense activity
radiotracer within the bladder urine.

Incidental CT findings: none

SKELETON: Large expansile lesion within the RIGHT inferior pubic
ramus measures 2.9 x 5.2 cm with SUV max equal 11.0.

Two hypermetabolic lesions in the LEFT iliac bone. The larger
posterior iliac bone is expansile extending through the cortex
measuring 4.9 cm with SUV max equal 8.8.

Smaller lesion the iliac wing.

Additional smaller lesion in the RIGHT inferior pubic ramus adjacent
to the pubic symphysis SUV max equal 9.5.

Incidental CT findings: none
IMPRESSION: 1. Large expansile intensely hypermetabolic lesions within the
pelvis consistent with aggressive metastatic carcinoma. Large
lesions within the LEFT inferior pubic ramus and posterior LEFT
iliac bone. Smaller lesions in the LEFT iliac wing and RIGHT
ischium.
2. Multiple hypermetabolic pulmonary nodules of varying size
consistent with widespread pulmonary metastasis.
3. Hypermetabolic LEFT hilar lymph node.
4. No metastatic adenopathy in the abdomen or pelvis.
